# Patient Record
Sex: Female | Born: 1980 | Race: White | Hispanic: Yes | Marital: Married | Smoking: Never smoker
Health system: Southern US, Community
[De-identification: ages and names within clinical notes are randomized; demographics above are authoritative.]

## PROBLEM LIST (undated history)

## (undated) ENCOUNTER — Inpatient Hospital Stay (HOSPITAL_COMMUNITY): Payer: Self-pay

## (undated) DIAGNOSIS — L509 Urticaria, unspecified: Secondary | ICD-10-CM

## (undated) DIAGNOSIS — K589 Irritable bowel syndrome without diarrhea: Secondary | ICD-10-CM

## (undated) DIAGNOSIS — N946 Dysmenorrhea, unspecified: Secondary | ICD-10-CM

## (undated) DIAGNOSIS — L309 Dermatitis, unspecified: Secondary | ICD-10-CM

## (undated) DIAGNOSIS — IMO0002 Reserved for concepts with insufficient information to code with codable children: Secondary | ICD-10-CM

## (undated) DIAGNOSIS — F32A Depression, unspecified: Secondary | ICD-10-CM

## (undated) DIAGNOSIS — K219 Gastro-esophageal reflux disease without esophagitis: Secondary | ICD-10-CM

## (undated) DIAGNOSIS — R51 Headache: Secondary | ICD-10-CM

## (undated) DIAGNOSIS — R87619 Unspecified abnormal cytological findings in specimens from cervix uteri: Secondary | ICD-10-CM

## (undated) DIAGNOSIS — Z889 Allergy status to unspecified drugs, medicaments and biological substances status: Secondary | ICD-10-CM

## (undated) DIAGNOSIS — Z8619 Personal history of other infectious and parasitic diseases: Secondary | ICD-10-CM

## (undated) DIAGNOSIS — G43909 Migraine, unspecified, not intractable, without status migrainosus: Secondary | ICD-10-CM

## (undated) DIAGNOSIS — F419 Anxiety disorder, unspecified: Secondary | ICD-10-CM

## (undated) HISTORY — DX: Dysmenorrhea, unspecified: N94.6

## (undated) HISTORY — DX: Reserved for concepts with insufficient information to code with codable children: IMO0002

## (undated) HISTORY — DX: Dermatitis, unspecified: L30.9

## (undated) HISTORY — DX: Personal history of other infectious and parasitic diseases: Z86.19

## (undated) HISTORY — DX: Gastro-esophageal reflux disease without esophagitis: K21.9

## (undated) HISTORY — DX: Depression, unspecified: F32.A

## (undated) HISTORY — DX: Anxiety disorder, unspecified: F41.9

## (undated) HISTORY — DX: Migraine, unspecified, not intractable, without status migrainosus: G43.909

## (undated) HISTORY — DX: Urticaria, unspecified: L50.9

## (undated) HISTORY — DX: Allergy status to unspecified drugs, medicaments and biological substances: Z88.9

## (undated) HISTORY — DX: Unspecified abnormal cytological findings in specimens from cervix uteri: R87.619

## (undated) HISTORY — DX: Irritable bowel syndrome, unspecified: K58.9

## (undated) HISTORY — DX: Headache: R51

---

## 2011-05-23 HISTORY — PX: ESOPHAGOGASTRODUODENOSCOPY: SHX1529

## 2012-11-12 ENCOUNTER — Encounter: Payer: Self-pay | Admitting: Obstetrics & Gynecology

## 2013-03-24 ENCOUNTER — Other Ambulatory Visit (INDEPENDENT_AMBULATORY_CARE_PROVIDER_SITE_OTHER): Payer: Medicaid Other | Admitting: General Practice

## 2013-03-24 DIAGNOSIS — Z3201 Encounter for pregnancy test, result positive: Secondary | ICD-10-CM

## 2013-04-08 ENCOUNTER — Encounter: Payer: Self-pay | Admitting: Obstetrics and Gynecology

## 2013-04-08 ENCOUNTER — Encounter: Payer: Self-pay | Admitting: Obstetrics & Gynecology

## 2013-04-08 ENCOUNTER — Ambulatory Visit (INDEPENDENT_AMBULATORY_CARE_PROVIDER_SITE_OTHER): Payer: Medicaid Other | Admitting: Obstetrics and Gynecology

## 2013-04-08 VITALS — BP 119/78 | Ht 61.0 in | Wt 144.8 lb

## 2013-04-08 DIAGNOSIS — Z3401 Encounter for supervision of normal first pregnancy, first trimester: Secondary | ICD-10-CM

## 2013-04-08 DIAGNOSIS — O9989 Other specified diseases and conditions complicating pregnancy, childbirth and the puerperium: Secondary | ICD-10-CM

## 2013-04-08 DIAGNOSIS — Z3400 Encounter for supervision of normal first pregnancy, unspecified trimester: Secondary | ICD-10-CM | POA: Insufficient documentation

## 2013-04-08 DIAGNOSIS — O26899 Other specified pregnancy related conditions, unspecified trimester: Secondary | ICD-10-CM | POA: Insufficient documentation

## 2013-04-08 DIAGNOSIS — O26891 Other specified pregnancy related conditions, first trimester: Secondary | ICD-10-CM

## 2013-04-08 DIAGNOSIS — R519 Headache, unspecified: Secondary | ICD-10-CM

## 2013-04-08 DIAGNOSIS — K589 Irritable bowel syndrome without diarrhea: Secondary | ICD-10-CM

## 2013-04-08 LAB — POCT URINALYSIS DIP (DEVICE)
Glucose, UA: NEGATIVE mg/dL
Hgb urine dipstick: NEGATIVE
Nitrite: NEGATIVE
Protein, ur: NEGATIVE mg/dL
Urobilinogen, UA: 0.2 mg/dL (ref 0.0–1.0)
pH: 6 (ref 5.0–8.0)

## 2013-04-08 NOTE — Progress Notes (Signed)
Pulse: 65 Discussed weight gain goal of 15-25 lb. Having trouble with her IBS, bloating. Has some cramping in her uterus. Getting better but has been like this for a few weeks.  Last pap August 2013, normal.

## 2013-04-08 NOTE — Patient Instructions (Signed)
Pregnancy - First Trimester During sexual intercourse, millions of sperm go into the vagina. Only 1 sperm will penetrate and fertilize the female egg while it is in the Fallopian tube. One week later, the fertilized egg implants into the wall of the uterus. An embryo begins to develop into a baby. At 6 to 8 weeks, the eyes and face are formed and the heartbeat can be seen on ultrasound. At the end of 12 weeks (first trimester), all the baby's organs are formed. Now that you are pregnant, you will want to do everything you can to have a healthy baby. Two of the most important things are to get good prenatal care and follow your caregiver's instructions. Prenatal care is all the medical care you receive before the baby's birth. It is given to prevent, find, and treat problems during the pregnancy and childbirth. PRENATAL EXAMS  During prenatal visits, your weight, blood pressure, and urine are checked. This is done to make sure you are healthy and progressing normally during the pregnancy.  A pregnant woman should gain 25 to 35 pounds during the pregnancy. However, if you are overweight or underweight, your caregiver will advise you regarding your weight.  Your caregiver will ask and answer questions for you.  Blood work, cervical cultures, other necessary tests, and a Pap test are done during your prenatal exams. These tests are done to check on your health and the probable health of your baby. Tests are strongly recommended and done for HIV with your permission. This is the virus that causes AIDS. These tests are done because medicines can be given to help prevent your baby from being born with this infection should you have been infected without knowing it. Blood work is also used to find out your blood type, previous infections, and follow your blood levels (hemoglobin).  Low hemoglobin (anemia) is common during pregnancy. Iron and vitamins are given to help prevent this. Later in the pregnancy,  blood tests for diabetes will be done along with any other tests if any problems develop.  You may need other tests to make sure you and the baby are doing well. CHANGES DURING THE FIRST TRIMESTER  Your body goes through many changes during pregnancy. They vary from person to person. Talk to your caregiver about changes you notice and are concerned about. Changes can include:  Your menstrual period stops.  The egg and sperm carry the genes that determine what you look like. Genes from you and your partner are forming a baby. The female genes determine whether the baby is a boy or a girl.  Your body increases in girth and you may feel bloated.  Feeling sick to your stomach (nauseous) and throwing up (vomiting). If the vomiting is uncontrollable, call your caregiver.  Your breasts will begin to enlarge and become tender.  Your nipples may stick out more and become darker.  The need to urinate more. Painful urination may mean you have a bladder infection.  Tiring easily.  Loss of appetite.  Cravings for certain kinds of food.  At first, you may gain or lose a couple of pounds.  You may have changes in your emotions from day to day (excited to be pregnant or concerned something may go wrong with the pregnancy and baby).  You may have more vivid and strange dreams. HOME CARE INSTRUCTIONS   It is very important to avoid all smoking, alcohol and non-prescribed drugs during your pregnancy. These affect the formation and growth of the baby.   Avoid chemicals while pregnant to ensure the delivery of a healthy infant.  Start your prenatal visits by the 12th week of pregnancy. They are usually scheduled monthly at first, then more often in the last 2 months before delivery. Keep your caregiver's appointments. Follow your caregiver's instructions regarding medicine use, blood and lab tests, exercise, and diet.  During pregnancy, you are providing food for you and your baby. Eat regular,  well-balanced meals. Choose foods such as meat, fish, milk and other low fat dairy products, vegetables, fruits, and whole-grain breads and cereals. Your caregiver will tell you of the ideal weight gain.  You can help morning sickness by keeping soda crackers at the bedside. Eat a couple before arising in the morning. You may want to use the crackers without salt on them.  Eating 4 to 5 small meals rather than 3 large meals a day also may help the nausea and vomiting.  Drinking liquids between meals instead of during meals also seems to help nausea and vomiting.  A physical sexual relationship may be continued throughout pregnancy if there are no other problems. Problems may be early (premature) leaking of amniotic fluid from the membranes, vaginal bleeding, or belly (abdominal) pain.  Exercise regularly if there are no restrictions. Check with your caregiver or physical therapist if you are unsure of the safety of some of your exercises. Greater weight gain will occur in the last 2 trimesters of pregnancy. Exercising will help:  Control your weight.  Keep you in shape.  Prepare you for labor and delivery.  Help you lose your pregnancy weight after you deliver your baby.  Wear a good support or jogging bra for breast tenderness during pregnancy. This may help if worn during sleep too.  Ask when prenatal classes are available. Begin classes when they are offered.  Do not use hot tubs, steam rooms, or saunas.  Wear your seat belt when driving. This protects you and your baby if you are in an accident.  Avoid raw meat, uncooked cheese, cat litter boxes, and soil used by cats throughout the pregnancy. These carry germs that can cause birth defects in the baby.  The first trimester is a good time to visit your dentist for your dental health. Getting your teeth cleaned is okay. Use a softer toothbrush and brush gently during pregnancy.  Ask for help if you have financial, counseling, or  nutritional needs during pregnancy. Your caregiver will be able to offer counseling for these needs as well as refer you for other special needs.  Do not take any medicines or herbs unless told by your caregiver.  Inform your caregiver if there is any mental or physical domestic violence.  Make a list of emergency phone numbers of family, friends, hospital, and police and fire departments.  Write down your questions. Take them to your prenatal visit.  Do not douche.  Do not cross your legs.  If you have to stand for long periods of time, rotate you feet or take small steps in a circle.  You may have more vaginal secretions that may require a sanitary pad. Do not use tampons or scented sanitary pads. MEDICINES AND DRUG USE IN PREGNANCY  Take prenatal vitamins as directed. The vitamin should contain 1 milligram of folic acid. Keep all vitamins out of reach of children. Only a couple vitamins or tablets containing iron may be fatal to a baby or young child when ingested.  Avoid use of all medicines, including herbs, over-the-counter medicines, not   prescribed or suggested by your caregiver. Only take over-the-counter or prescription medicines for pain, discomfort, or fever as directed by your caregiver. Do not use aspirin, ibuprofen, or naproxen unless directed by your caregiver.  Let your caregiver also know about herbs you may be using.  Alcohol is related to a number of birth defects. This includes fetal alcohol syndrome. All alcohol, in any form, should be avoided completely. Smoking will cause low birth rate and premature babies.  Street or illegal drugs are very harmful to the baby. They are absolutely forbidden. A baby born to an addicted mother will be addicted at birth. The baby will go through the same withdrawal an adult does.  Let your caregiver know about any medicines that you have to take and for what reason you take them. SEEK MEDICAL CARE IF:  You have any concerns or  worries during your pregnancy. It is better to call with your questions if you feel they cannot wait, rather than worry about them. SEEK IMMEDIATE MEDICAL CARE IF:   An unexplained oral temperature above 102 F (38.9 C) develops, or as your caregiver suggests.  You have leaking of fluid from the vagina (birth canal). If leaking membranes are suspected, take your temperature and inform your caregiver of this when you call.  There is vaginal spotting or bleeding. Notify your caregiver of the amount and how many pads are used.  You develop a bad smelling vaginal discharge with a change in the color.  You continue to feel sick to your stomach (nauseated) and have no relief from remedies suggested. You vomit blood or coffee ground-like materials.  You lose more than 2 pounds of weight in 1 week.  You gain more than 2 pounds of weight in 1 week and you notice swelling of your face, hands, feet, or legs.  You gain 5 pounds or more in 1 week (even if you do not have swelling of your hands, face, legs, or feet).  You get exposed to German measles and have never had them.  You are exposed to fifth disease or chickenpox.  You develop belly (abdominal) pain. Round ligament discomfort is a common non-cancerous (benign) cause of abdominal pain in pregnancy. Your caregiver still must evaluate this.  You develop headache, fever, diarrhea, pain with urination, or shortness of breath.  You fall or are in a car accident or have any kind of trauma.  There is mental or physical violence in your home. Document Released: 09/03/2001 Document Revised: 06/03/2012 Document Reviewed: 03/07/2009 [employer] Patient Information 2014 [employer].  Contraception Choices Contraception (birth control) is the use of any methods or devices to prevent pregnancy. Below are some methods to help avoid pregnancy. HORMONAL METHODS   Contraceptive implant. This is a thin, plastic tube containing progesterone hormone. It  does not contain estrogen hormone. Your caregiver inserts the tube in the inner part of the upper arm. The tube can remain in place for up to 3 years. After 3 years, the implant must be removed. The implant prevents the ovaries from releasing an egg (ovulation), thickens the cervical mucus which prevents sperm from entering the uterus, and thins the lining of the inside of the uterus.  Progesterone-only injections. These injections are given every 3 months by your caregiver to prevent pregnancy. This synthetic progesterone hormone stops the ovaries from releasing eggs. It also thickens cervical mucus and changes the uterine lining. This makes it harder for sperm to survive in the uterus.  Birth control pills. These pills   contain estrogen and progesterone hormone. They work by stopping the egg from forming in the ovary (ovulation). Birth control pills are prescribed by a caregiver.Birth control pills can also be used to treat heavy periods.  Minipill. This type of birth control pill contains only the progesterone hormone. They are taken every day of each month and must be prescribed by your caregiver.  Birth control patch. The patch contains hormones similar to those in birth control pills. It must be changed once a week and is prescribed by a caregiver.  Vaginal ring. The ring contains hormones similar to those in birth control pills. It is left in the vagina for 3 weeks, removed for 1 week, and then a new one is put back in place. The patient must be comfortable inserting and removing the ring from the vagina.A caregiver's prescription is necessary.  Emergency contraception. Emergency contraceptives prevent pregnancy after unprotected sexual intercourse. This pill can be taken right after sex or up to 5 days after unprotected sex. It is most effective the sooner you take the pills after having sexual intercourse. Emergency contraceptive pills are available without a prescription. Check with your  pharmacist. Do not use emergency contraception as your only form of birth control. BARRIER METHODS   Female condom. This is a thin sheath (latex or rubber) that is worn over the penis during sexual intercourse. It can be used with spermicide to increase effectiveness.  Female condom. This is a soft, loose-fitting sheath that is put into the vagina before sexual intercourse.  Diaphragm. This is a soft, latex, dome-shaped barrier that must be fitted by a caregiver. It is inserted into the vagina, along with a spermicidal jelly. It is inserted before intercourse. The diaphragm should be left in the vagina for 6 to 8 hours after intercourse.  Cervical cap. This is a round, soft, latex or plastic cup that fits over the cervix and must be fitted by a caregiver. The cap can be left in place for up to 48 hours after intercourse.  Sponge. This is a soft, circular piece of polyurethane foam. The sponge has spermicide in it. It is inserted into the vagina after wetting it and before sexual intercourse.  Spermicides. These are chemicals that kill or block sperm from entering the cervix and uterus. They come in the form of creams, jellies, suppositories, foam, or tablets. They do not require a prescription. They are inserted into the vagina with an applicator before having sexual intercourse. The process must be repeated every time you have sexual intercourse. INTRAUTERINE CONTRACEPTION  Intrauterine device (IUD). This is a T-shaped device that is put in a woman's uterus during a menstrual period to prevent pregnancy. There are 2 types:  Copper IUD. This type of IUD is wrapped in copper wire and is placed inside the uterus. Copper makes the uterus and fallopian tubes produce a fluid that kills sperm. It can stay in place for 10 years.  Hormone IUD. This type of IUD contains the hormone progestin (synthetic progesterone). The hormone thickens the cervical mucus and prevents sperm from entering the uterus, and it  also thins the uterine lining to prevent implantation of a fertilized egg. The hormone can weaken or kill the sperm that get into the uterus. It can stay in place for 5 years. PERMANENT METHODS OF CONTRACEPTION  Female tubal ligation. This is when the woman's fallopian tubes are surgically sealed, tied, or blocked to prevent the egg from traveling to the uterus.  Female sterilization. This is when   the female has the tubes that carry sperm tied off (vasectomy).This blocks sperm from entering the vagina during sexual intercourse. After the procedure, the man can still ejaculate fluid (semen). NATURAL PLANNING METHODS  Natural family planning. This is not having sexual intercourse or using a barrier method (condom, diaphragm, cervical cap) on days the woman could become pregnant.  Calendar method. This is keeping track of the length of each menstrual cycle and identifying when you are fertile.  Ovulation method. This is avoiding sexual intercourse during ovulation.  Symptothermal method. This is avoiding sexual intercourse during ovulation, using a thermometer and ovulation symptoms.  Post-ovulation method. This is timing sexual intercourse after you have ovulated. Regardless of which type or method of contraception you choose, it is important that you use condoms to protect against the transmission of sexually transmitted diseases (STDs). Talk with your caregiver about which form of contraception is most appropriate for you. Document Released: 09/09/2005 Document Revised: 12/02/2011 Document Reviewed: 01/16/2011 [employer] Patient Information 2014 [employer].  Breastfeeding A change in hormones during your pregnancy causes growth of your breast tissue and an increase in number and size of milk ducts. The hormone prolactin allows proteins, sugars, and fats from your blood supply to make breast milk in your milk-producing glands. The hormone progesterone prevents breast milk from being released  before the birth of your baby. After the birth of your baby, your progesterone level decreases allowing breast milk to be released. Thoughts of your baby, as well as his or her sucking or crying, can stimulate the release of milk from the milk-producing glands. Deciding to breastfeed (nurse) is one of the best choices you can make for you and your baby. The information that follows gives a brief review of the benefits, as well as other important skills to know about breastfeeding. BENEFITS OF BREASTFEEDING For your baby  The first milk (colostrum) helps your baby's digestive system function better.   There are antibodies in your milk that help your baby fight off infections.   Your baby has a lower incidence of asthma, allergies, and sudden infant death syndrome (SIDS).   The nutrients in breast milk are better for your baby than infant formulas.  Breast milk improves your baby's brain development.   Your baby will have less gas, colic, and constipation.  Your baby is less likely to develop other conditions, such as childhood obesity, asthma, or diabetes mellitus. For you  Breastfeeding helps develop a very special bond between you and your baby.   Breastfeeding is convenient, always available at the correct temperature, and costs nothing.   Breastfeeding helps to burn calories and helps you lose the weight gained during pregnancy.   Breastfeeding makes your uterus contract back down to normal size faster and slows bleeding following delivery.   Breastfeeding mothers have a lower risk of developing osteoporosis or breast or ovarian cancer later in life.  BREASTFEEDING FREQUENCY  A healthy, full-term baby may breastfeed as often as every hour or space his or her feedings to every 3 hours. Breastfeeding frequency will vary from baby to baby.   Newborns should be fed no less than every 2 3 hours during the day and every 4 5 hours during the night. You should breastfeed a  minimum of 8 feedings in a 24 hour period.  Awaken your baby to breastfeed if it has been 3 4 hours since the last feeding.  Breastfeed when you feel the need to reduce the fullness of your breasts or when   your newborn shows signs of hunger. Signs that your baby may be hungry include:  Increased alertness or activity.  Stretching.  Movement of the head from side to side.  Movement of the head and opening of the mouth when the corner of the mouth or cheek is stroked (rooting).  Increased sucking sounds, smacking lips, cooing, sighing, or squeaking.  Hand-to-mouth movements.  Increased sucking of fingers or hands.  Fussing.  Intermittent crying.  Signs of extreme hunger will require calming and consoling before you try to feed your baby. Signs of extreme hunger may include:  Restlessness.  A loud, strong cry.  Screaming.  Frequent feeding will help you make more milk and will help prevent problems, such as sore nipples and engorgement of the breasts.  BREASTFEEDING   Whether lying down or sitting, be sure that the baby's abdomen is facing your abdomen.   Support your breast with 4 fingers under your breast and your thumb above your nipple. Make sure your fingers are well away from your nipple and your baby's mouth.   Stroke your baby's lips gently with your finger or nipple.   When your baby's mouth is open wide enough, place all of your nipple and as much of the colored area around your nipple (areola) as possible into your baby's mouth.  More areola should be visible above his or her upper lip than below his or her lower lip.  Your baby's tongue should be between his or her lower gum and your breast.  Ensure that your baby's mouth is correctly positioned around the nipple (latched). Your baby's lips should create a seal on your breast.  Signs that your baby has effectively latched onto your nipple include:  Tugging or sucking without pain.  Swallowing heard  between sucks.  Absent click or smacking sound.  Muscle movement above and in front of his or her ears with sucking.  Your baby must suck about 2 3 minutes in order to get your milk. Allow your baby to feed on each breast as long as he or she wants. Nurse your baby until he or she unlatches or falls asleep at the first breast, then offer the second breast.  Signs that your baby is full and satisfied include:  A gradual decrease in the number of sucks or complete cessation of sucking.  Falling asleep.  Extension or relaxation of his or her body.  Retention of a small amount of milk in his or her mouth.  Letting go of your breast by himself or herself.  Signs of effective breastfeeding in you include:  Breasts that have increased firmness, weight, and size prior to feeding.  Breasts that are softer after nursing.  Increased milk volume, as well as a change in milk consistency and color by the 5th day of breastfeeding.  Breast fullness relieved by breastfeeding.  Nipples are not sore, cracked, or bleeding.  If needed, break the suction by putting your finger into the corner of your baby's mouth and sliding your finger between his or her gums. Then, remove your breast from his or her mouth.  It is common for babies to spit up a small amount after a feeding.  Babies often swallow air during feeding. This can make babies fussy. Burping your baby between breasts can help with this.  Vitamin D supplements are recommended for babies who get only breast milk.  Avoid using a pacifier during your baby's first 4 6 weeks.  Avoid supplemental feedings of water, formula, or   juice in place of breastfeeding. Breast milk is all the food your baby needs. It is not necessary for your baby to have water or formula. Your breasts will make more milk if supplemental feedings are avoided during the early weeks. HOW TO TELL WHETHER YOUR BABY IS GETTING ENOUGH BREAST MILK Wondering whether or not  your baby is getting enough milk is a common concern among mothers. You can be assured that your baby is getting enough milk if:   Your baby is actively sucking and you hear swallowing.   Your baby seems relaxed and satisfied after a feeding.   Your baby nurses at least 8 12 times in a 24 hour time period.  During the first 3 5 days of age:  Your baby is wetting at least 3 5 diapers in a 24 hour period. The urine should be clear and pale yellow.  Your baby is having at least 3 4 stools in a 24 hour period. The stool should be soft and yellow.  At 5 7 days of age, your baby is having at least 3 6 stools in a 24 hour period. The stool should be seedy and yellow by 5 days of age.  Your baby has a weight loss less than 7 10% during the first 3 days of age.  Your baby does not lose weight after 3 7 days of age.  Your baby gains 4 7 ounces each week after he or she is 4 days of age.  Your baby gains weight by 5 days of age and is back to birth weight within 2 weeks. ENGORGEMENT In the first week after your baby is born, you may experience extremely full breasts (engorgement). When engorged, your breasts may feel heavy, warm, or tender to the touch. Engorgement peaks within 24 48 hours after delivery of your baby.  Engorgement may be reduced by:  Continuing to breastfeed.  Increasing the frequency of breastfeeding.  Taking warm showers or applying warm, moist heat to your breasts just before each feeding. This increases circulation and helps the milk flow.   Gently massaging your breast before and during the feedings. With your fingertips, massage from your chest wall towards your nipple in a circular motion.   Ensuring that your baby empties at least one breast at every feeding. It also helps to start the next feeding on the opposite breast.   Expressing breast milk by hand or by using a breast pump to empty the breasts if your baby is sleepy, or not nursing well. You may also  want to express milk if you are returning to work oryou feel you are getting engorged.  Ensuring your baby is latched on and positioned properly while breastfeeding. If you follow these suggestions, your engorgement should improve in 24 48 hours. If you are still experiencing difficulty, call your lactation consultant or caregiver.  CARING FOR YOURSELF Take care of your breasts.  Bathe or shower daily.   Avoid using soap on your nipples.   Wear a supportive bra. Avoid wearing underwire style bras.  Air dry your nipples for a 3 4minutes after each feeding.   Use only cotton bra pads to absorb breast milk leakage. Leaking of breast milk between feedings is normal.   Use only pure lanolin on your nipples after nursing. You do not need to wash it off before feeding your baby again. Another option is to express a few drops of breast milk and gently massage that milk into your nipples.  Continue   breast self-awareness checks. Take care of yourself.  Eat healthy foods. Alternate 3 meals with 3 snacks.  Avoid foods that you notice affect your baby in a bad way.  Drink milk, fruit juice, and water to satisfy your thirst (about 8 glasses a day).   Rest often, relax, and take your prenatal vitamins to prevent fatigue, stress, and anemia.  Avoid chewing and smoking tobacco.  Avoid alcohol and drug use.  Take over-the-counter and prescribed medicine only as directed by your caregiver or pharmacist. You should always check with your caregiver or pharmacist before taking any new medicine, vitamin, or herbal supplement.  Know that pregnancy is possible while breastfeeding. If desired, talk to your caregiver about family planning and safe birth control methods that may be used while breastfeeding. SEEK MEDICAL CARE IF:   You feel like you want to stop breastfeeding or have become frustrated with breastfeeding.  You have painful breasts or nipples.  Your nipples are cracked or  bleeding.  Your breasts are red, tender, or warm.  You have a swollen area on either breast.  You have a fever or chills.  You have nausea or vomiting.  You have drainage from your nipples.  Your breasts do not become full before feedings by the 5th day after delivery.  You feel sad and depressed.  Your baby is too sleepy to eat well.  Your baby is having trouble sleeping.   Your baby is wetting less than 3 diapers in a 24 hour period.  Your baby has less than 3 stools in a 24 hour period.  Your baby's skin or the white part of his or her eyes becomes more yellow.   Your baby is not gaining weight by 5 days of age. MAKE SURE YOU:   Understand these instructions.  Will watch your condition.  Will get help right away if you are not doing well or get worse. Document Released: 09/09/2005 Document Revised: 06/03/2012 Document Reviewed: 04/15/2012 [employer] Patient Information 2014 [employer].  

## 2013-04-08 NOTE — Progress Notes (Signed)
   Subjective:    Rhonda Graves is a G1P0 [redacted]w[redacted]d being seen today for her first obstetrical visit.  Her obstetrical history is significant for migraine headaches and IBS. Patient does intend to breast feed. Pregnancy history fully reviewed.  Patient reports some cramping pain over the past 3 weeks which is improving.  Filed Vitals:   04/08/13 0811 04/08/13 0813  BP: 119/78   Height:  5\' 1"  (1.549 m)  Weight: 144 lb 12.8 oz (65.681 kg)     HISTORY: OB History   Grav Para Term Preterm Abortions TAB SAB Ect Mult Living   1              # Outc Date GA Lbr Len/2nd Wgt Sex Del Anes PTL Lv   1 CUR              Past Medical History  Diagnosis Date  . Headache(784.0)     migraine  . IBS (irritable bowel syndrome)   . Multiple allergies   . GERD (gastroesophageal reflux disease)   . Abnormal Pap smear     had colpo in past, normal pap since then   Past Surgical History  Procedure Laterality Date  . No past surgeries     Family History  Problem Relation Age of Onset  . Arthritis Mother   . Hypothyroidism Mother   . Asthma Brother      Exam    Uterus:     Pelvic Exam:    Perineum: No Hemorrhoids, Normal Perineum   Vulva: normal   Vagina:  normal mucosa, normal discharge   pH:    Cervix: closed, long, posterior   Adnexa: normal adnexa and no mass, fullness, tenderness   Bony Pelvis: android  System: Breast:  normal appearance, no masses or tenderness   Skin: normal coloration and turgor, no rashes    Neurologic: oriented, no focal deficits   Extremities: normal strength, tone, and muscle mass   HEENT extra ocular movement intact   Mouth/Teeth mucous membranes moist, pharynx normal without lesions and dental hygiene good   Neck supple and no masses   Cardiovascular: regular rate and rhythm   Respiratory:  chest clear, no wheezing, crepitations, rhonchi, normal symmetric air entry   Abdomen: soft, non-tender; bowel sounds normal; no masses,  no organomegaly   Urinary:        Assessment:    Pregnancy: G1P0 Patient Active Problem List   Diagnosis Date Noted  . Supervision of normal first pregnancy 04/08/2013  . Headache in pregnancy, antepartum 04/08/2013  . IBS (irritable bowel syndrome) 04/08/2013        Plan:     Initial labs drawn. Prenatal vitamins. Problem list reviewed and updated. Patient with history of abnormal pap smear- pap smear collected today Genetic Screening discussed First Screen and Quad Screen: patient desires to wait for Medicaid to come .  Ultrasound discussed; fetal survey: requested.  Follow up in 4 weeks. 50% of 30 min visit spent on counseling and coordination of care.     Azalia Neuberger 04/08/2013

## 2013-04-09 LAB — HIV ANTIBODY (ROUTINE TESTING W REFLEX): HIV: NONREACTIVE

## 2013-04-09 LAB — OBSTETRIC PANEL
Antibody Screen: NEGATIVE
Basophils Absolute: 0 10*3/uL (ref 0.0–0.1)
Basophils Relative: 0 % (ref 0–1)
Eosinophils Relative: 2 % (ref 0–5)
HCT: 37.3 % (ref 36.0–46.0)
MCHC: 33.5 g/dL (ref 30.0–36.0)
MCV: 89.4 fL (ref 78.0–100.0)
Monocytes Absolute: 0.6 10*3/uL (ref 0.1–1.0)
RDW: 13.4 % (ref 11.5–15.5)

## 2013-04-10 LAB — CULTURE, OB URINE

## 2013-04-12 ENCOUNTER — Encounter: Payer: Self-pay | Admitting: Obstetrics and Gynecology

## 2013-04-12 LAB — HEMOGLOBINOPATHY EVALUATION
Hgb A2 Quant: 3.2 % (ref 2.2–3.2)
Hgb F Quant: 0 % (ref 0.0–2.0)

## 2013-05-06 ENCOUNTER — Ambulatory Visit (INDEPENDENT_AMBULATORY_CARE_PROVIDER_SITE_OTHER): Payer: Medicaid Other | Admitting: Obstetrics and Gynecology

## 2013-05-06 VITALS — BP 127/76 | Temp 98.0°F | Wt 145.2 lb

## 2013-05-06 DIAGNOSIS — Z3400 Encounter for supervision of normal first pregnancy, unspecified trimester: Secondary | ICD-10-CM

## 2013-05-06 LAB — POCT URINALYSIS DIP (DEVICE)
Bilirubin Urine: NEGATIVE
Nitrite: NEGATIVE
Protein, ur: NEGATIVE mg/dL
Urobilinogen, UA: 0.2 mg/dL (ref 0.0–1.0)
pH: 6.5 (ref 5.0–8.0)

## 2013-05-06 NOTE — Progress Notes (Signed)
Patient doing well. Lab results reviewed with patient. Patient complaining of tender varicose veins in right lower extremity. 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 On exam, both extremities are equal in size, no palpable cord. Visible and palpable varicose vein in right lower extremity not tender.  Advised patient to elevate legs as often as possible, use compression stocking to minimize progression of varicose veins during pregnancy. If pain returns or becomes persistent may need to be seen by vascular surgery. Patient still awaiting medicaid and desires to have genetic screening done pending medicaid

## 2013-05-06 NOTE — Progress Notes (Signed)
Pulse- 58  Pt reports pain in right calf with varicose veins

## 2013-05-27 ENCOUNTER — Encounter: Payer: Self-pay | Admitting: Obstetrics & Gynecology

## 2013-06-03 ENCOUNTER — Encounter: Payer: Self-pay | Admitting: Obstetrics & Gynecology

## 2013-06-08 ENCOUNTER — Ambulatory Visit (INDEPENDENT_AMBULATORY_CARE_PROVIDER_SITE_OTHER): Payer: Medicaid Other | Admitting: Obstetrics and Gynecology

## 2013-06-08 VITALS — BP 111/76 | Temp 98.9°F | Wt 150.5 lb

## 2013-06-08 DIAGNOSIS — O9989 Other specified diseases and conditions complicating pregnancy, childbirth and the puerperium: Secondary | ICD-10-CM

## 2013-06-08 DIAGNOSIS — K589 Irritable bowel syndrome without diarrhea: Secondary | ICD-10-CM

## 2013-06-08 LAB — POCT URINALYSIS DIP (DEVICE)
Nitrite: NEGATIVE
Protein, ur: NEGATIVE mg/dL
Urobilinogen, UA: 0.2 mg/dL (ref 0.0–1.0)

## 2013-06-08 NOTE — Progress Notes (Signed)
P = 69   Pt reports increased pelvic pressure, back pain and abdominal cramping   She also has pain of lower Rt leg due to varicosities- has been wearing a support stocking.

## 2013-06-08 NOTE — Progress Notes (Signed)
Feels 1 or 2 fleeting abdominal cramps/day and has LBP often. Abd bloating she attributes to IBS. Denies UTI sx. Discussed abdominal tightening exercises. Anatomy US scheduled. Declines quad screen.

## 2013-06-08 NOTE — Patient Instructions (Signed)
Pregnancy - Second Trimester The second trimester of pregnancy (3 to 6 months) is a period of rapid growth for you and your baby. At the end of the sixth month, your baby is about 9 inches long and weighs 1 1/2 pounds. You will begin to feel the baby move between 18 and 20 weeks of the pregnancy. This is called quickening. Weight gain is faster. A clear fluid (colostrum) may leak out of your breasts. You may feel small contractions of the womb (uterus). This is known as false labor or Braxton-Hicks contractions. This is like a practice for labor when the baby is ready to be born. Usually, the problems with morning sickness have usually passed by the end of your first trimester. Some women develop small dark blotches (called cholasma, mask of pregnancy) on their face that usually goes away after the baby is born. Exposure to the sun makes the blotches worse. Acne may also develop in some pregnant women and pregnant women who have acne, may find that it goes away. PRENATAL EXAMS  Blood work may continue to be done during prenatal exams. These tests are done to check on your health and the probable health of your baby. Blood work is used to follow your blood levels (hemoglobin). Anemia (low hemoglobin) is common during pregnancy. Iron and vitamins are given to help prevent this. You will also be checked for diabetes between 24 and 28 weeks of the pregnancy. Some of the previous blood tests may be repeated.  The size of the uterus is measured during each visit. This is to make sure that the baby is continuing to grow properly according to the dates of the pregnancy.  Your blood pressure is checked every prenatal visit. This is to make sure you are not getting toxemia.  Your urine is checked to make sure you do not have an infection, diabetes or protein in the urine.  Your weight is checked often to make sure gains are happening at the suggested rate. This is to ensure that both you and your baby are  growing normally.  Sometimes, an ultrasound is performed to confirm the proper growth and development of the baby. This is a test which bounces harmless sound waves off the baby so your caregiver can more accurately determine due dates. Sometimes, a test is done on the amniotic fluid surrounding the baby. This test is called an amniocentesis. The amniotic fluid is obtained by sticking a needle into the belly (abdomen). This is done to check the chromosomes in instances where there is a concern about possible genetic problems with the baby. It is also sometimes done near the end of pregnancy if an early delivery is required. In this case, it is done to help make sure the baby's lungs are mature enough for the baby to live outside of the womb. CHANGES OCCURING IN THE SECOND TRIMESTER OF PREGNANCY Your body goes through many changes during pregnancy. They vary from person to person. Talk to your caregiver about changes you notice that you are concerned about.  During the second trimester, you will likely have an increase in your appetite. It is normal to have cravings for certain foods. This varies from person to person and pregnancy to pregnancy.  Your lower abdomen will begin to bulge.  You may have to urinate more often because the uterus and baby are pressing on your bladder. It is also common to get more bladder infections during pregnancy. You can help this by drinking lots of fluids   and emptying your bladder before and after intercourse.  You may begin to get stretch marks on your hips, abdomen, and breasts. These are normal changes in the body during pregnancy. There are no exercises or medicines to take that prevent this change.  You may begin to develop swollen and bulging veins (varicose veins) in your legs. Wearing support hose, elevating your feet for 15 minutes, 3 to 4 times a day and limiting salt in your diet helps lessen the problem.  Heartburn may develop as the uterus grows and  pushes up against the stomach. Antacids recommended by your caregiver helps with this problem. Also, eating smaller meals 4 to 5 times a day helps.  Constipation can be treated with a stool softener or adding bulk to your diet. Drinking lots of fluids, and eating vegetables, fruits, and whole grains are helpful.  Exercising is also helpful. If you have been very active up until your pregnancy, most of these activities can be continued during your pregnancy. If you have been less active, it is helpful to start an exercise program such as walking.  Hemorrhoids may develop at the end of the second trimester. Warm sitz baths and hemorrhoid cream recommended by your caregiver helps hemorrhoid problems.  Backaches may develop during this time of your pregnancy. Avoid heavy lifting, wear low heal shoes, and practice good posture to help with backache problems.  Some pregnant women develop tingling and numbness of their hand and fingers because of swelling and tightening of ligaments in the wrist (carpel tunnel syndrome). This goes away after the baby is born.  As your breasts enlarge, you may have to get a bigger bra. Get a comfortable, cotton, support bra. Do not get a nursing bra until the last month of the pregnancy if you will be nursing the baby.  You may get a dark line from your belly button to the pubic area called the linea nigra.  You may develop rosy cheeks because of increase blood flow to the face.  You may develop spider looking lines of the face, neck, arms, and chest. These go away after the baby is born. HOME CARE INSTRUCTIONS   It is extremely important to avoid all smoking, herbs, alcohol, and unprescribed drugs during your pregnancy. These chemicals affect the formation and growth of the baby. Avoid these chemicals throughout the pregnancy to ensure the delivery of a healthy infant.  Most of your home care instructions are the same as suggested for the first trimester of your  pregnancy. Keep your caregiver's appointments. Follow your caregiver's instructions regarding medicine use, exercise, and diet.  During pregnancy, you are providing food for you and your baby. Continue to eat regular, well-balanced meals. Choose foods such as meat, fish, milk and other low fat dairy products, vegetables, fruits, and whole-grain breads and cereals. Your caregiver will tell you of the ideal weight gain.  A physical sexual relationship may be continued up until near the end of pregnancy if there are no other problems. Problems could include early (premature) leaking of amniotic fluid from the membranes, vaginal bleeding, abdominal pain, or other medical or pregnancy problems.  Exercise regularly if there are no restrictions. Check with your caregiver if you are unsure of the safety of some of your exercises. The greatest weight gain will occur in the last 2 trimesters of pregnancy. Exercise will help you:  Control your weight.  Get you in shape for labor and delivery.  Lose weight after you have the baby.  Wear   a good support or jogging bra for breast tenderness during pregnancy. This may help if worn during sleep. Pads or tissues may be used in the bra if you are leaking colostrum.  Do not use hot tubs, steam rooms or saunas throughout the pregnancy.  Wear your seat belt at all times when driving. This protects you and your baby if you are in an accident.  Avoid raw meat, uncooked cheese, cat litter boxes, and soil used by cats. These carry germs that can cause birth defects in the baby.  The second trimester is also a good time to visit your dentist for your dental health if this has not been done yet. Getting your teeth cleaned is okay. Use a soft toothbrush. Brush gently during pregnancy.  It is easier to leak urine during pregnancy. Tightening up and strengthening the pelvic muscles will help with this problem. Practice stopping your urination while you are going to the  bathroom. These are the same muscles you need to strengthen. It is also the muscles you would use as if you were trying to stop from passing gas. You can practice tightening these muscles up 10 times a set and repeating this about 3 times per day. Once you know what muscles to tighten up, do not perform these exercises during urination. It is more likely to contribute to an infection by backing up the urine.  Ask for help if you have financial, counseling, or nutritional needs during pregnancy. Your caregiver will be able to offer counseling for these needs as well as refer you for other special needs.  Your skin may become oily. If so, wash your face with mild soap, use non-greasy moisturizer and oil or cream based makeup. MEDICINES AND DRUG USE IN PREGNANCY  Take prenatal vitamins as directed. The vitamin should contain 1 milligram of folic acid. Keep all vitamins out of reach of children. Only a couple vitamins or tablets containing iron may be fatal to a baby or young child when ingested.  Avoid use of all medicines, including herbs, over-the-counter medicines, not prescribed or suggested by your caregiver. Only take over-the-counter or prescription medicines for pain, discomfort, or fever as directed by your caregiver. Do not use aspirin.  Let your caregiver also know about herbs you may be using.  Alcohol is related to a number of birth defects. This includes fetal alcohol syndrome. All alcohol, in any form, should be avoided completely. Smoking will cause low birth rate and premature babies.  Street or illegal drugs are very harmful to the baby. They are absolutely forbidden. A baby born to an addicted mother will be addicted at birth. The baby will go through the same withdrawal an adult does. SEEK MEDICAL CARE IF:  You have any concerns or worries during your pregnancy. It is better to call with your questions if you feel they cannot wait, rather than worry about them. SEEK IMMEDIATE  MEDICAL CARE IF:   An unexplained oral temperature above 102 F (38.9 C) develops, or as your caregiver suggests.  You have leaking of fluid from the vagina (birth canal). If leaking membranes are suspected, take your temperature and tell your caregiver of this when you call.  There is vaginal spotting, bleeding, or passing clots. Tell your caregiver of the amount and how many pads are used. Light spotting in pregnancy is common, especially following intercourse.  You develop a bad smelling vaginal discharge with a change in the color from clear to white.  You continue to feel   sick to your stomach (nauseated) and have no relief from remedies suggested. You vomit blood or coffee ground-like materials.  You lose more than 2 pounds of weight or gain more than 2 pounds of weight over 1 week, or as suggested by your caregiver.  You notice swelling of your face, hands, feet, or legs.  You get exposed to German measles and have never had them.  You are exposed to fifth disease or chickenpox.  You develop belly (abdominal) pain. Round ligament discomfort is a common non-cancerous (benign) cause of abdominal pain in pregnancy. Your caregiver still must evaluate you.  You develop a bad headache that does not go away.  You develop fever, diarrhea, pain with urination, or shortness of breath.  You develop visual problems, blurry, or double vision.  You fall or are in a car accident or any kind of trauma.  There is mental or physical violence at home. Document Released: 09/03/2001 Document Revised: 06/03/2012 Document Reviewed: 03/08/2009 [employer] Patient Information 2014 [employer].  

## 2013-06-29 ENCOUNTER — Other Ambulatory Visit: Payer: Self-pay | Admitting: Obstetrics and Gynecology

## 2013-06-29 ENCOUNTER — Ambulatory Visit (HOSPITAL_COMMUNITY)
Admission: RE | Admit: 2013-06-29 | Discharge: 2013-06-29 | Disposition: A | Discharge status: Home / Self Care | Payer: Medicaid Other | Source: Ambulatory Visit | Attending: Obstetrics and Gynecology | Admitting: Obstetrics and Gynecology

## 2013-06-29 DIAGNOSIS — Z3689 Encounter for other specified antenatal screening: Secondary | ICD-10-CM | POA: Insufficient documentation

## 2013-06-29 DIAGNOSIS — K589 Irritable bowel syndrome without diarrhea: Secondary | ICD-10-CM

## 2013-06-29 DIAGNOSIS — Z3402 Encounter for supervision of normal first pregnancy, second trimester: Secondary | ICD-10-CM

## 2013-06-29 DIAGNOSIS — O358XX1 Maternal care for other (suspected) fetal abnormality and damage, fetus 1: Secondary | ICD-10-CM

## 2013-06-30 DIAGNOSIS — O358XX0 Maternal care for other (suspected) fetal abnormality and damage, not applicable or unspecified: Secondary | ICD-10-CM | POA: Insufficient documentation

## 2013-07-06 ENCOUNTER — Ambulatory Visit (INDEPENDENT_AMBULATORY_CARE_PROVIDER_SITE_OTHER): Payer: Medicaid Other | Admitting: Family Medicine

## 2013-07-06 ENCOUNTER — Encounter: Payer: Self-pay | Admitting: Obstetrics & Gynecology

## 2013-07-06 ENCOUNTER — Encounter: Payer: Self-pay | Admitting: Family Medicine

## 2013-07-06 VITALS — BP 121/72 | Temp 96.3°F | Wt 158.0 lb

## 2013-07-06 DIAGNOSIS — B379 Candidiasis, unspecified: Secondary | ICD-10-CM

## 2013-07-06 DIAGNOSIS — O358XX0 Maternal care for other (suspected) fetal abnormality and damage, not applicable or unspecified: Secondary | ICD-10-CM

## 2013-07-06 DIAGNOSIS — Z3402 Encounter for supervision of normal first pregnancy, second trimester: Secondary | ICD-10-CM

## 2013-07-06 DIAGNOSIS — Z23 Encounter for immunization: Secondary | ICD-10-CM

## 2013-07-06 LAB — POCT URINALYSIS DIP (DEVICE)
Bilirubin Urine: NEGATIVE
Glucose, UA: NEGATIVE mg/dL
Ketones, ur: NEGATIVE mg/dL
Specific Gravity, Urine: 1.02 (ref 1.005–1.030)
Urobilinogen, UA: 0.2 mg/dL (ref 0.0–1.0)

## 2013-07-06 MED ORDER — FLUCONAZOLE 150 MG PO TABS: 150.0000 mg | ORAL_TABLET | Freq: Once | ORAL | Status: DC

## 2013-07-06 NOTE — Progress Notes (Signed)
Pt doing well overall. 20w0 today - reviewed [place] - EIF that recommends a 4-6week f/u scan - having a girl - taking ranitidine for heartburn and helps a lot - has a bump on her labia and wants to know what it is  O: see flowsheet.   A/P - went over [place] results. Will plan of f/u US to schedule at next visit (pt requests [facility] as she knows them better- works here as an Equities trader) - bump on labia appears like a folliculitis but overall irritated appearance adn itching. Will treat with diflucan x 1.   - f/u in 4 weeks.

## 2013-07-06 NOTE — Patient Instructions (Signed)
Pregnancy - Second Trimester The second trimester of pregnancy (3 to 6 months) is a period of rapid growth for you and your baby. At the end of the sixth month, your baby is about 9 inches long and weighs 1 1/2 pounds. You will begin to feel the baby move between 18 and 20 weeks of the pregnancy. This is called quickening. Weight gain is faster. A clear fluid (colostrum) may leak out of your breasts. You may feel small contractions of the womb (uterus). This is known as false labor or Braxton-Hicks contractions. This is like a practice for labor when the baby is ready to be born. Usually, the problems with morning sickness have usually passed by the end of your first trimester. Some women develop small dark blotches (called cholasma, mask of pregnancy) on their face that usually goes away after the baby is born. Exposure to the sun makes the blotches worse. Acne may also develop in some pregnant women and pregnant women who have acne, may find that it goes away. PRENATAL EXAMS  Blood work may continue to be done during prenatal exams. These tests are done to check on your health and the probable health of your baby. Blood work is used to follow your blood levels (hemoglobin). Anemia (low hemoglobin) is common during pregnancy. Iron and vitamins are given to help prevent this. You will also be checked for diabetes between 24 and 28 weeks of the pregnancy. Some of the previous blood tests may be repeated.  The size of the uterus is measured during each visit. This is to make sure that the baby is continuing to grow properly according to the dates of the pregnancy.  Your blood pressure is checked every prenatal visit. This is to make sure you are not getting toxemia.  Your urine is checked to make sure you do not have an infection, diabetes or protein in the urine.  Your weight is checked often to make sure gains are happening at the suggested rate. This is to ensure that both you and your baby are  growing normally.  Sometimes, an ultrasound is performed to confirm the proper growth and development of the baby. This is a test which bounces harmless sound waves off the baby so your caregiver can more accurately determine due dates. Sometimes, a test is done on the amniotic fluid surrounding the baby. This test is called an amniocentesis. The amniotic fluid is obtained by sticking a needle into the belly (abdomen). This is done to check the chromosomes in instances where there is a concern about possible genetic problems with the baby. It is also sometimes done near the end of pregnancy if an early delivery is required. In this case, it is done to help make sure the baby's lungs are mature enough for the baby to live outside of the womb. CHANGES OCCURING IN THE SECOND TRIMESTER OF PREGNANCY Your body goes through many changes during pregnancy. They vary from person to person. Talk to your caregiver about changes you notice that you are concerned about.  During the second trimester, you will likely have an increase in your appetite. It is normal to have cravings for certain foods. This varies from person to person and pregnancy to pregnancy.  Your lower abdomen will begin to bulge.  You may have to urinate more often because the uterus and baby are pressing on your bladder. It is also common to get more bladder infections during pregnancy. You can help this by drinking lots of fluids   and emptying your bladder before and after intercourse.  You may begin to get stretch marks on your hips, abdomen, and breasts. These are normal changes in the body during pregnancy. There are no exercises or medicines to take that prevent this change.  You may begin to develop swollen and bulging veins (varicose veins) in your legs. Wearing support hose, elevating your feet for 15 minutes, 3 to 4 times a day and limiting salt in your diet helps lessen the problem.  Heartburn may develop as the uterus grows and  pushes up against the stomach. Antacids recommended by your caregiver helps with this problem. Also, eating smaller meals 4 to 5 times a day helps.  Constipation can be treated with a stool softener or adding bulk to your diet. Drinking lots of fluids, and eating vegetables, fruits, and whole grains are helpful.  Exercising is also helpful. If you have been very active up until your pregnancy, most of these activities can be continued during your pregnancy. If you have been less active, it is helpful to start an exercise program such as walking.  Hemorrhoids may develop at the end of the second trimester. Warm sitz baths and hemorrhoid cream recommended by your caregiver helps hemorrhoid problems.  Backaches may develop during this time of your pregnancy. Avoid heavy lifting, wear low heal shoes, and practice good posture to help with backache problems.  Some pregnant women develop tingling and numbness of their hand and fingers because of swelling and tightening of ligaments in the wrist (carpel tunnel syndrome). This goes away after the baby is born.  As your breasts enlarge, you may have to get a bigger bra. Get a comfortable, cotton, support bra. Do not get a nursing bra until the last month of the pregnancy if you will be nursing the baby.  You may get a dark line from your belly button to the pubic area called the linea nigra.  You may develop rosy cheeks because of increase blood flow to the face.  You may develop spider looking lines of the face, neck, arms, and chest. These go away after the baby is born. HOME CARE INSTRUCTIONS   It is extremely important to avoid all smoking, herbs, alcohol, and unprescribed drugs during your pregnancy. These chemicals affect the formation and growth of the baby. Avoid these chemicals throughout the pregnancy to ensure the delivery of a healthy infant.  Most of your home care instructions are the same as suggested for the first trimester of your  pregnancy. Keep your caregiver's appointments. Follow your caregiver's instructions regarding medicine use, exercise, and diet.  During pregnancy, you are providing food for you and your baby. Continue to eat regular, well-balanced meals. Choose foods such as meat, fish, milk and other low fat dairy products, vegetables, fruits, and whole-grain breads and cereals. Your caregiver will tell you of the ideal weight gain.  A physical sexual relationship may be continued up until near the end of pregnancy if there are no other problems. Problems could include early (premature) leaking of amniotic fluid from the membranes, vaginal bleeding, abdominal pain, or other medical or pregnancy problems.  Exercise regularly if there are no restrictions. Check with your caregiver if you are unsure of the safety of some of your exercises. The greatest weight gain will occur in the last 2 trimesters of pregnancy. Exercise will help you:  Control your weight.  Get you in shape for labor and delivery.  Lose weight after you have the baby.  Wear   a good support or jogging bra for breast tenderness during pregnancy. This may help if worn during sleep. Pads or tissues may be used in the bra if you are leaking colostrum.  Do not use hot tubs, steam rooms or saunas throughout the pregnancy.  Wear your seat belt at all times when driving. This protects you and your baby if you are in an accident.  Avoid raw meat, uncooked cheese, cat litter boxes, and soil used by cats. These carry germs that can cause birth defects in the baby.  The second trimester is also a good time to visit your dentist for your dental health if this has not been done yet. Getting your teeth cleaned is okay. Use a soft toothbrush. Brush gently during pregnancy.  It is easier to leak urine during pregnancy. Tightening up and strengthening the pelvic muscles will help with this problem. Practice stopping your urination while you are going to the  bathroom. These are the same muscles you need to strengthen. It is also the muscles you would use as if you were trying to stop from passing gas. You can practice tightening these muscles up 10 times a set and repeating this about 3 times per day. Once you know what muscles to tighten up, do not perform these exercises during urination. It is more likely to contribute to an infection by backing up the urine.  Ask for help if you have financial, counseling, or nutritional needs during pregnancy. Your caregiver will be able to offer counseling for these needs as well as refer you for other special needs.  Your skin may become oily. If so, wash your face with mild soap, use non-greasy moisturizer and oil or cream based makeup. MEDICINES AND DRUG USE IN PREGNANCY  Take prenatal vitamins as directed. The vitamin should contain 1 milligram of folic acid. Keep all vitamins out of reach of children. Only a couple vitamins or tablets containing iron may be fatal to a baby or young child when ingested.  Avoid use of all medicines, including herbs, over-the-counter medicines, not prescribed or suggested by your caregiver. Only take over-the-counter or prescription medicines for pain, discomfort, or fever as directed by your caregiver. Do not use aspirin.  Let your caregiver also know about herbs you may be using.  Alcohol is related to a number of birth defects. This includes fetal alcohol syndrome. All alcohol, in any form, should be avoided completely. Smoking will cause low birth rate and premature babies.  Street or illegal drugs are very harmful to the baby. They are absolutely forbidden. A baby born to an addicted mother will be addicted at birth. The baby will go through the same withdrawal an adult does. SEEK MEDICAL CARE IF:  You have any concerns or worries during your pregnancy. It is better to call with your questions if you feel they cannot wait, rather than worry about them. SEEK IMMEDIATE  MEDICAL CARE IF:   An unexplained oral temperature above 102 F (38.9 C) develops, or as your caregiver suggests.  You have leaking of fluid from the vagina (birth canal). If leaking membranes are suspected, take your temperature and tell your caregiver of this when you call.  There is vaginal spotting, bleeding, or passing clots. Tell your caregiver of the amount and how many pads are used. Light spotting in pregnancy is common, especially following intercourse.  You develop a bad smelling vaginal discharge with a change in the color from clear to white.  You continue to feel   sick to your stomach (nauseated) and have no relief from remedies suggested. You vomit blood or coffee ground-like materials.  You lose more than 2 pounds of weight or gain more than 2 pounds of weight over 1 week, or as suggested by your caregiver.  You notice swelling of your face, hands, feet, or legs.  You get exposed to German measles and have never had them.  You are exposed to fifth disease or chickenpox.  You develop belly (abdominal) pain. Round ligament discomfort is a common non-cancerous (benign) cause of abdominal pain in pregnancy. Your caregiver still must evaluate you.  You develop a bad headache that does not go away.  You develop fever, diarrhea, pain with urination, or shortness of breath.  You develop visual problems, blurry, or double vision.  You fall or are in a car accident or any kind of trauma.  There is mental or physical violence at home. Document Released: 09/03/2001 Document Revised: 06/03/2012 Document Reviewed: 03/08/2009 [employer] Patient Information 2014 [employer].  

## 2013-07-06 NOTE — Progress Notes (Signed)
Pulse- 75  Edema-feet  Pain-sharp pain "ligament" Pt reports a bump on outside of left labia

## 2013-07-12 ENCOUNTER — Other Ambulatory Visit: Payer: Medicaid Other

## 2013-07-12 DIAGNOSIS — Z1379 Encounter for other screening for genetic and chromosomal anomalies: Secondary | ICD-10-CM

## 2013-07-13 ENCOUNTER — Telehealth: Payer: Self-pay | Admitting: *Deleted

## 2013-07-13 LAB — AFP, QUAD SCREEN
AFP: 54.9 IU/mL
Age Alone: 1:600 {titer}
Down Syndrome Scr Risk Est: 1:7850 {titer}
HCG, Total: 16439 m[IU]/mL
Interpretation-AFP: NEGATIVE
MoM for INH: 0.69
Open Spina bifida: NEGATIVE
Tri 18 Scr Risk Est: NEGATIVE
uE3 Mom: 0.87
uE3 Value: 1.5 ng/mL

## 2013-07-13 NOTE — Telephone Encounter (Signed)
Pt left message requesting to know what OTC meds she can take for a cold.  Returned call to pt and discussed her concern. She has been having nasal congestion, sore throat and a dry cough. I advised her that she may take the following medications: sudafed for nasal congestion, any OTC cough suppressant syrup, any cough lozenges and tylenol for aches and pains. She may also gargle with waqrm salt water several times daily for sore throat.  Pt voiced understanding.

## 2013-07-14 ENCOUNTER — Encounter: Payer: Self-pay | Admitting: Obstetrics and Gynecology

## 2013-08-03 ENCOUNTER — Ambulatory Visit (INDEPENDENT_AMBULATORY_CARE_PROVIDER_SITE_OTHER): Payer: Medicaid Other | Admitting: Family Medicine

## 2013-08-03 ENCOUNTER — Encounter: Payer: Self-pay | Admitting: Family Medicine

## 2013-08-03 VITALS — BP 133/82 | Temp 97.8°F | Wt 163.2 lb

## 2013-08-03 DIAGNOSIS — Z3402 Encounter for supervision of normal first pregnancy, second trimester: Secondary | ICD-10-CM

## 2013-08-03 DIAGNOSIS — O358XX1 Maternal care for other (suspected) fetal abnormality and damage, fetus 1: Secondary | ICD-10-CM

## 2013-08-03 DIAGNOSIS — O358XX0 Maternal care for other (suspected) fetal abnormality and damage, not applicable or unspecified: Secondary | ICD-10-CM

## 2013-08-03 LAB — POCT URINALYSIS DIP (DEVICE)
Glucose, UA: NEGATIVE mg/dL
Ketones, ur: NEGATIVE mg/dL
Specific Gravity, Urine: 1.025 (ref 1.005–1.030)
Urobilinogen, UA: 0.2 mg/dL (ref 0.0–1.0)

## 2013-08-03 NOTE — Progress Notes (Signed)
Pulse- 73 Patient reports lower back and pelvic pain

## 2013-08-03 NOTE — Progress Notes (Signed)
S: pt here for [redacted]w[redacted]d visit Having lots of fetal movement - no ctx, lof, vb. +FM  O: see flowsheet  A/P - still very interested in waterbirth - brought certificate from class filled out- scanned - reviewed quad results - f/u US ordered for EIF follow up  - f/u in 4 weeks

## 2013-08-04 ENCOUNTER — Encounter (HOSPITAL_COMMUNITY): Payer: Self-pay

## 2013-08-04 ENCOUNTER — Ambulatory Visit (HOSPITAL_COMMUNITY)
Admission: RE | Admit: 2013-08-04 | Discharge: 2013-08-04 | Disposition: A | Discharge status: Home / Self Care | Payer: Medicaid Other | Source: Ambulatory Visit | Attending: Family Medicine | Admitting: Family Medicine

## 2013-08-04 DIAGNOSIS — O358XX0 Maternal care for other (suspected) fetal abnormality and damage, not applicable or unspecified: Secondary | ICD-10-CM | POA: Insufficient documentation

## 2013-08-04 DIAGNOSIS — O358XX1 Maternal care for other (suspected) fetal abnormality and damage, fetus 1: Secondary | ICD-10-CM

## 2013-08-05 ENCOUNTER — Encounter: Payer: Self-pay | Admitting: *Deleted

## 2013-08-05 ENCOUNTER — Ambulatory Visit (HOSPITAL_COMMUNITY): Payer: Medicaid Other

## 2013-08-11 ENCOUNTER — Other Ambulatory Visit: Payer: Self-pay

## 2013-08-11 MED ORDER — FLUCONAZOLE 150 MG PO TABS: 150.0000 mg | ORAL_TABLET | Freq: Once | ORAL | Status: DC

## 2013-08-26 ENCOUNTER — Encounter: Payer: Self-pay | Admitting: *Deleted

## 2013-08-31 ENCOUNTER — Encounter: Payer: Self-pay | Admitting: Advanced Practice Midwife

## 2013-08-31 ENCOUNTER — Ambulatory Visit (INDEPENDENT_AMBULATORY_CARE_PROVIDER_SITE_OTHER): Payer: Medicaid Other | Admitting: Obstetrics and Gynecology

## 2013-08-31 VITALS — BP 134/75 | Wt 166.6 lb

## 2013-08-31 DIAGNOSIS — IMO0001 Reserved for inherently not codable concepts without codable children: Secondary | ICD-10-CM

## 2013-08-31 DIAGNOSIS — Z23 Encounter for immunization: Secondary | ICD-10-CM

## 2013-08-31 DIAGNOSIS — Z3401 Encounter for supervision of normal first pregnancy, first trimester: Secondary | ICD-10-CM

## 2013-08-31 DIAGNOSIS — O358XX1 Maternal care for other (suspected) fetal abnormality and damage, fetus 1: Secondary | ICD-10-CM

## 2013-08-31 DIAGNOSIS — O358XX0 Maternal care for other (suspected) fetal abnormality and damage, not applicable or unspecified: Secondary | ICD-10-CM

## 2013-08-31 DIAGNOSIS — Z3403 Encounter for supervision of normal first pregnancy, third trimester: Secondary | ICD-10-CM

## 2013-08-31 DIAGNOSIS — K589 Irritable bowel syndrome without diarrhea: Secondary | ICD-10-CM

## 2013-08-31 LAB — CBC
MCH: 30.8 pg (ref 26.0–34.0)
MCHC: 34.5 g/dL (ref 30.0–36.0)
Platelets: 277 10*3/uL (ref 150–400)
RDW: 13.6 % (ref 11.5–15.5)

## 2013-08-31 LAB — POCT URINALYSIS DIP (DEVICE)
Glucose, UA: NEGATIVE mg/dL
Specific Gravity, Urine: >=1.03 (ref 1.005–1.030)
Urobilinogen, UA: 0.2 mg/dL (ref 0.0–1.0)

## 2013-08-31 MED ORDER — EPINEPHRINE 0.3 MG/0.3ML IJ SOAJ: 0.3000 mg | Freq: Once | INTRAMUSCULAR | Status: DC

## 2013-08-31 MED ORDER — DOCUSATE SODIUM 50 MG PO CAPS: 100.0000 mg | ORAL_CAPSULE | Freq: Two times a day (BID) | ORAL | Status: DC

## 2013-08-31 MED ORDER — TETANUS-DIPHTH-ACELL PERTUSSIS 5-2.5-18.5 LF-MCG/0.5 IM SUSP: 0.5000 mL | Freq: Once | INTRAMUSCULAR | Status: DC

## 2013-08-31 MED ORDER — PRENATAL 27-0.8 MG PO TABS: 1.0000 | ORAL_TABLET | Freq: Every day | ORAL | Status: DC

## 2013-08-31 NOTE — Progress Notes (Signed)
Patient doing well without complaints. FM/PTL precautions reviewed. Patient reports occasional pain in right calf with varicosities. Patient has had similar pains in the past and inquired about treatment options. Dicussed pressure stockings and feet elevation. Discussed referral to vascular surgery if persistent. Patient states pain is manageable for now.   1hr GCT, labs and TDap today

## 2013-08-31 NOTE — Progress Notes (Signed)
Pulse- 78  Pressure- when baby moves Pt reports on swelling on right leg with varicose veins

## 2013-08-31 NOTE — Addendum Note (Signed)
Addended by: Faythe Casa on: 08/31/2013 11:31 AM   Modules accepted: Orders

## 2013-08-31 NOTE — Addendum Note (Signed)
Addended by: Candelaria Stagers E on: 08/31/2013 01:40 PM   Modules accepted: Orders

## 2013-09-01 ENCOUNTER — Encounter: Payer: Self-pay | Admitting: Obstetrics and Gynecology

## 2013-09-01 LAB — HIV ANTIBODY (ROUTINE TESTING W REFLEX): HIV: NONREACTIVE

## 2013-09-01 LAB — RPR

## 2013-09-01 LAB — GLUCOSE TOLERANCE, 1 HOUR (50G) W/O FASTING: Glucose, 1 Hour GTT: 88 mg/dL (ref 70–140)

## 2013-09-06 ENCOUNTER — Telehealth: Payer: Self-pay

## 2013-09-06 DIAGNOSIS — Z3403 Encounter for supervision of normal first pregnancy, third trimester: Secondary | ICD-10-CM

## 2013-09-06 MED ORDER — PRENATAL VITAMINS PLUS 27-1 MG PO TABS: 1.0000 | ORAL_TABLET | Freq: Every day | ORAL | Status: DC

## 2013-09-06 NOTE — Telephone Encounter (Signed)
Pt. Called and states she would like a PNV ordered and sent to her pharmacy.

## 2013-09-06 NOTE — Telephone Encounter (Signed)
Called pt. And left message stating we have changed her prescription and it is waiting for her at her pharmacy, call clinic with any questions or concerns.

## 2013-09-13 ENCOUNTER — Encounter: Payer: Self-pay | Admitting: *Deleted

## 2013-09-21 ENCOUNTER — Other Ambulatory Visit: Payer: Self-pay

## 2013-09-21 MED ORDER — FLUCONAZOLE 150 MG PO TABS: 150.0000 mg | ORAL_TABLET | Freq: Every day | ORAL | Status: AC

## 2013-09-21 NOTE — Telephone Encounter (Signed)
Pt c/o white discharge with itchiness and irriation.  Per Dr. Erin Fulling pt should take diflucan 150 mg po daily for 3 days.

## 2013-09-28 ENCOUNTER — Encounter: Payer: Self-pay | Admitting: Obstetrics & Gynecology

## 2013-09-28 ENCOUNTER — Ambulatory Visit (INDEPENDENT_AMBULATORY_CARE_PROVIDER_SITE_OTHER): Payer: Medicaid Other | Admitting: Obstetrics & Gynecology

## 2013-09-28 VITALS — BP 126/71 | Temp 97.3°F | Wt 172.0 lb

## 2013-09-28 DIAGNOSIS — O9989 Other specified diseases and conditions complicating pregnancy, childbirth and the puerperium: Secondary | ICD-10-CM

## 2013-09-28 DIAGNOSIS — R51 Headache: Secondary | ICD-10-CM

## 2013-09-28 DIAGNOSIS — O26899 Other specified pregnancy related conditions, unspecified trimester: Secondary | ICD-10-CM

## 2013-09-28 LAB — POCT URINALYSIS DIP (DEVICE)
Bilirubin Urine: NEGATIVE
Glucose, UA: NEGATIVE mg/dL
Ketones, ur: NEGATIVE mg/dL
NITRITE: NEGATIVE
PH: 5.5 (ref 5.0–8.0)
PROTEIN: NEGATIVE mg/dL
Specific Gravity, Urine: >=1.03 (ref 1.005–1.030)
Urobilinogen, UA: 0.2 mg/dL (ref 0.0–1.0)

## 2013-09-28 NOTE — Progress Notes (Signed)
Pt with no complaints Reviewed safety measures. Reviewed 28 weeks labs

## 2013-09-28 NOTE — Patient Instructions (Signed)
Breastfeeding Deciding to breastfeed is one of the best choices you can make for you and your baby. A change in hormones during pregnancy causes your breast tissue to grow and increases the number and size of your milk ducts. These hormones also allow proteins, sugars, and fats from your blood supply to make breast milk in your milk-producing glands. Hormones prevent breast milk from being released before your baby is born as well as prompt milk flow after birth. Once breastfeeding has begun, thoughts of your baby, as well as his or her sucking or crying, can stimulate the release of milk from your milk-producing glands.  BENEFITS OF BREASTFEEDING For Your Baby  Your first milk (colostrum) helps your baby's digestive system function better.   There are antibodies in your milk that help your baby fight off infections.   Your baby has a lower incidence of asthma, allergies, and sudden infant death syndrome.   The nutrients in breast milk are better for your baby than infant formulas and are designed uniquely for your baby's needs.   Breast milk improves your baby's brain development.   Your baby is less likely to develop other conditions, such as childhood obesity, asthma, or type 2 diabetes mellitus.  For You   Breastfeeding helps to create a very special bond between you and your baby.   Breastfeeding is convenient. Breast milk is always available at the correct temperature and costs nothing.   Breastfeeding helps to burn calories and helps you lose the weight gained during pregnancy.   Breastfeeding makes your uterus contract to its prepregnancy size faster and slows bleeding (lochia) after you give birth.   Breastfeeding helps to lower your risk of developing type 2 diabetes mellitus, osteoporosis, and breast or ovarian cancer later in life. SIGNS THAT YOUR BABY IS HUNGRY Early Signs of Hunger  Increased alertness or activity.  Stretching.  Movement of the head from  side to side.  Movement of the head and opening of the mouth when the corner of the mouth or cheek is stroked (rooting).  Increased sucking sounds, smacking lips, cooing, sighing, or squeaking.  Hand-to-mouth movements.  Increased sucking of fingers or hands. Late Signs of Hunger  Fussing.  Intermittent crying. Extreme Signs of Hunger Signs of extreme hunger will require calming and consoling before your baby will be able to breastfeed successfully. Do not wait for the following signs of extreme hunger to occur before you initiate breastfeeding:   Restlessness.  A loud, strong cry.   Screaming. BREASTFEEDING BASICS Breastfeeding Initiation  Find a comfortable place to sit or lie down, with your neck and back well supported.  Place a pillow or rolled up blanket under your baby to bring him or her to the level of your breast (if you are seated). Nursing pillows are specially designed to help support your arms and your baby while you breastfeed.  Make sure that your baby's abdomen is facing your abdomen.   Gently massage your breast. With your fingertips, massage from your chest wall toward your nipple in a circular motion. This encourages milk flow. You may need to continue this action during the feeding if your milk flows slowly.  Support your breast with 4 fingers underneath and your thumb above your nipple. Make sure your fingers are well away from your nipple and your baby's mouth.   Stroke your baby's lips gently with your finger or nipple.   When your baby's mouth is open wide enough, quickly bring your baby to your  breast, placing your entire nipple and as much of the colored area around your nipple (areola) as possible into your baby's mouth.   More areola should be visible above your baby's upper lip than below the lower lip.   Your baby's tongue should be between his or her lower gum and your breast.   Ensure that your baby's mouth is correctly positioned  around your nipple (latched). Your baby's lips should create a seal on your breast and be turned out (everted).  It is common for your baby to suck about 2 3 minutes in order to start the flow of breast milk. Latching Teaching your baby how to latch on to your breast properly is very important. An improper latch can cause nipple pain and decreased milk supply for you and poor weight gain in your baby. Also, if your baby is not latched onto your nipple properly, he or she may swallow some air during feeding. This can make your baby fussy. Burping your baby when you switch breasts during the feeding can help to get rid of the air. However, teaching your baby to latch on properly is still the best way to prevent fussiness from swallowing air while breastfeeding. Signs that your baby has successfully latched on to your nipple:    Silent tugging or silent sucking, without causing you pain.   Swallowing heard between every 3 4 sucks.    Muscle movement above and in front of his or her ears while sucking.  Signs that your baby has not successfully latched on to nipple:   Sucking sounds or smacking sounds from your baby while breastfeeding.  Nipple pain. If you think your baby has not latched on correctly, slip your finger into the corner of your baby's mouth to break the suction and place it between your baby's gums. Attempt breastfeeding initiation again. Signs of Successful Breastfeeding Signs from your baby:   A gradual decrease in the number of sucks or complete cessation of sucking.   Falling asleep.   Relaxation of his or her body.   Retention of a small amount of milk in his or her mouth.   Letting go of your breast by himself or herself. Signs from you:  Breasts that have increased in firmness, weight, and size 1 3 hours after feeding.   Breasts that are softer immediately after breastfeeding.  Increased milk volume, as well as a change in milk consistency and color by  the 5th day of breastfeeding.   Nipples that are not sore, cracked, or bleeding. Signs That Your Randel Books is Getting Enough Milk  Wetting at least 3 diapers in a 24-hour period. The urine should be clear and pale yellow by age 64411 days.  At least 3 stools in a 24-hour period by age 64411 days. The stool should be soft and yellow.  At least 3 stools in a 24-hour period by age 644 days. The stool should be seedy and yellow.  No loss of weight greater than 10% of birth weight during the first 22 days of age.  Average weight gain of 4 7 ounces (120 210 mL) per week after age 64 days.  Consistent daily weight gain by age 60 days, without weight loss after the age of 2 weeks. After a feeding, your baby may spit up a small amount. This is common. BREASTFEEDING FREQUENCY AND DURATION Frequent feeding will help you make more milk and can prevent sore nipples and breast engorgement. Breastfeed when you feel the need to reduce  the fullness of your breasts or when your baby shows signs of hunger. This is called "breastfeeding on demand." Avoid introducing a pacifier to your baby while you are working to establish breastfeeding (the first 4 6 weeks after your baby is born). After this time you may choose to use a pacifier. Research has shown that pacifier use during the first year of a baby's life decreases the risk of sudden infant death syndrome (SIDS). Allow your baby to feed on each breast as long as he or she wants. Breastfeed until your baby is finished feeding. When your baby unlatches or falls asleep while feeding from the first breast, offer the second breast. Because newborns are often sleepy in the first few weeks of life, you may need to awaken your baby to get him or her to feed. Breastfeeding times will vary from baby to baby. However, the following rules can serve as a guide to help you ensure that your baby is properly fed:  Newborns (babies 4 weeks of age or younger) may breastfeed every 1 3  hours.  Newborns should not go longer than 3 hours during the day or 5 hours during the night without breastfeeding.  You should breastfeed your baby a minimum of 8 times in a 24-hour period until you begin to introduce solid foods to your baby at around 6 months of age. BREAST MILK PUMPING Pumping and storing breast milk allows you to ensure that your baby is exclusively fed your breast milk, even at times when you are unable to breastfeed. This is especially important if you are going back to work while you are still breastfeeding or when you are not able to be present during feedings. Your lactation consultant can give you guidelines on how long it is safe to store breast milk.  A breast pump is a machine that allows you to pump milk from your breast into a sterile bottle. The pumped breast milk can then be stored in a refrigerator or freezer. Some breast pumps are operated by hand, while others use electricity. Ask your lactation consultant which type will work best for you. Breast pumps can be purchased, but some hospitals and breastfeeding support groups lease breast pumps on a monthly basis. A lactation consultant can teach you how to hand express breast milk, if you prefer not to use a pump.  CARING FOR YOUR BREASTS WHILE YOU BREASTFEED Nipples can become dry, cracked, and sore while breastfeeding. The following recommendations can help keep your breasts moisturized and healthy:  Avoid using soap on your nipples.   Wear a supportive bra. Although not required, special nursing bras and tank tops are designed to allow access to your breasts for breastfeeding without taking off your entire bra or top. Avoid wearing underwire style bras or extremely tight bras.  Air dry your nipples for 3 4minutes after each feeding.   Use only cotton bra pads to absorb leaked breast milk. Leaking of breast milk between feedings is normal.   Use lanolin on your nipples after breastfeeding. Lanolin helps to  maintain your skin's normal moisture barrier. If you use pure lanolin you do not need to wash it off before feeding your baby again. Pure lanolin is not toxic to your baby. You may also hand express a few drops of breast milk and gently massage that milk into your nipples and allow the milk to air dry. In the first few weeks after giving birth, some women experience extremely full breasts (engorgement). Engorgement can make   your breasts feel heavy, warm, and tender to the touch. Engorgement peaks within 3 5 days after you give birth. The following recommendations can help ease engorgement:  Completely empty your breasts while breastfeeding or pumping. You may want to start by applying warm, moist heat (in the shower or with warm water-soaked hand towels) just before feeding or pumping. This increases circulation and helps the milk flow. If your baby does not completely empty your breasts while breastfeeding, pump any extra milk after he or she is finished.  Wear a snug bra (nursing or regular) or tank top for 1 2 days to signal your body to slightly decrease milk production.  Apply ice packs to your breasts, unless this is too uncomfortable for you.  Make sure that your baby is latched on and positioned properly while breastfeeding. If engorgement persists after 48 hours of following these recommendations, contact your health care provider or a Science writer. OVERALL HEALTH CARE RECOMMENDATIONS WHILE BREASTFEEDING  Eat healthy foods. Alternate between meals and snacks, eating 3 of each per day. Because what you eat affects your breast milk, some of the foods may make your baby more irritable than usual. Avoid eating these foods if you are sure that they are negatively affecting your baby.  Drink milk, fruit juice, and water to satisfy your thirst (about 10 glasses a day).   Rest often, relax, and continue to take your prenatal vitamins to prevent fatigue, stress, and anemia.  Continue  breast self-awareness checks.  Avoid chewing and smoking tobacco.  Avoid alcohol and drug use. Some medicines that may be harmful to your baby can pass through breast milk. It is important to ask your health care provider before taking any medicine, including all over-the-counter and prescription medicine as well as vitamin and herbal supplements. It is possible to become pregnant while breastfeeding. If birth control is desired, ask your health care provider about options that will be safe for your baby. SEEK MEDICAL CARE IF:   You feel like you want to stop breastfeeding or have become frustrated with breastfeeding.  You have painful breasts or nipples.  Your nipples are cracked or bleeding.  Your breasts are red, tender, or warm.  You have a swollen area on either breast.  You have a fever or chills.  You have nausea or vomiting.  You have drainage other than breast milk from your nipples.  Your breasts do not become full before feedings by the 5th day after you give birth.  You feel sad and depressed.  Your baby is too sleepy to eat well.  Your baby is having trouble sleeping.   Your baby is wetting less than 3 diapers in a 24-hour period.  Your baby has less than 3 stools in a 24-hour period.  Your baby's skin or the white part of his or her eyes becomes yellow.   Your baby is not gaining weight by 74 days of age. SEEK IMMEDIATE MEDICAL CARE IF:   Your baby is overly tired (lethargic) and does not want to wake up and feed.  Your baby develops an unexplained fever. Document Released: 09/09/2005 Document Revised: 05/12/2013 Document Reviewed: 03/03/2013 [facility] Patient Information 2014 [place].

## 2013-09-28 NOTE — Progress Notes (Signed)
p=82

## 2013-10-08 ENCOUNTER — Encounter (HOSPITAL_COMMUNITY): Payer: Self-pay | Admitting: Emergency Medicine

## 2013-10-08 ENCOUNTER — Telehealth (HOSPITAL_COMMUNITY): Payer: Self-pay | Admitting: Family Medicine

## 2013-10-08 ENCOUNTER — Emergency Department (INDEPENDENT_AMBULATORY_CARE_PROVIDER_SITE_OTHER)
Admission: EM | Admit: 2013-10-08 | Discharge: 2013-10-08 | Disposition: A | Discharge status: Home / Self Care | Payer: Medicaid Other | Source: Home / Self Care | Attending: Family Medicine | Admitting: Family Medicine

## 2013-10-08 DIAGNOSIS — J069 Acute upper respiratory infection, unspecified: Secondary | ICD-10-CM

## 2013-10-08 DIAGNOSIS — Z331 Pregnant state, incidental: Secondary | ICD-10-CM

## 2013-10-08 DIAGNOSIS — Z3400 Encounter for supervision of normal first pregnancy, unspecified trimester: Secondary | ICD-10-CM

## 2013-10-08 LAB — INFLUENZA PANEL BY PCR (TYPE A & B)
H1N1 flu by pcr: NOT DETECTED
INFLBPCR: NEGATIVE
Influenza A By PCR: NEGATIVE

## 2013-10-08 LAB — POCT RAPID STREP A: Streptococcus, Group A Screen (Direct): NEGATIVE

## 2013-10-08 MED ORDER — ANTIPYRINE-BENZOCAINE 5.4-1.4 % OT SOLN: 3.0000 [drp] | OTIC | Status: DC | PRN

## 2013-10-08 MED ORDER — IPRATROPIUM BROMIDE 0.06 % NA SOLN
2.0000 | Freq: Four times a day (QID) | NASAL | Status: DC
Start: 2013-10-08 — End: 2013-11-03

## 2013-10-08 NOTE — Discharge Instructions (Signed)
Thank you for coming in today. Use the nasal spray as needed,.  Use th ear drops for pain as needed,.  Continue tylenol for symptoms.  I will call you with your flu results.  Call or go to the emergency room if you get worse, have trouble breathing, have chest pains, or palpitations.

## 2013-10-08 NOTE — ED Provider Notes (Signed)
Rhonda Graves is a 33 y.o. female who presents to Urgent Care today for sore throat ear pain body aches and headaches. The symptoms started yesterday evening. Patient has tried Tylenol which helps a little. Her has been recently got over a one-week long illness that was similar but severe. She suspects that he had influenza. Both her and her husband have the flu vaccination this year. She denies any current fevers or chills nausea vomiting or diarrhea. However complicating her history is current pregnancy at [redacted] weeks gestation. She is not having any complications this pregnancy.   Past Medical History  Diagnosis Date  . Headache(784.0)     migraine  . IBS (irritable bowel syndrome)   . Multiple allergies   . GERD (gastroesophageal reflux disease)   . Abnormal Pap smear     had colpo in past, normal pap since then   History  Substance Use Topics  . Smoking status: Never Smoker   . Smokeless tobacco: Never Used  . Alcohol Use: Yes     Comment: Not currently drinking   ROS as above Medications: No current facility-administered medications for this encounter.   Current Outpatient Prescriptions  Medication Sig Dispense Refill  . acetaminophen (TYLENOL) 325 MG tablet Take 650 mg by mouth every 6 (six) hours as needed for pain.      . Prenatal Vit-Fe Fumarate-FA (PRENATAL VITAMINS PLUS) 27-1 MG TABS Take 1 tablet by mouth daily.  30 tablet  12  . antipyrine-benzocaine (AURALGAN) otic solution Place 3-4 drops into the right ear every 2 (two) hours as needed for ear pain.  10 mL  0  . calcium carbonate (TUMS - DOSED IN MG ELEMENTAL CALCIUM) 500 MG chewable tablet Chew 1 tablet by mouth daily.      . Cetirizine HCl (ZYRTEC ALLERGY) 10 MG CAPS Take 1 capsule by mouth daily.      Marland Kitchen docusate sodium (COLACE) 50 MG capsule Take 2 capsules (100 mg total) by mouth 2 (two) times daily.  30 capsule  1  . EPINEPHrine (EPI-PEN) 0.3 mg/0.3 mL SOAJ injection Inject 0.3 mLs (0.3 mg total) into the muscle once.   1 Device  0  . ipratropium (ATROVENT) 0.06 % nasal spray Place 2 sprays into both nostrils 4 (four) times daily.  15 mL  1  . Melatonin 1 MG TABS Take 1 tablet by mouth at bedtime as needed.      . Prenatal Vit-Fe Fumarate-FA (MULTIVITAMIN-PRENATAL) 27-0.8 MG TABS tablet Take 1 tablet by mouth daily at 12 noon.  30 each  0  . ranitidine (ZANTAC) 150 MG tablet Take 150 mg by mouth daily.        Exam:  BP 118/67  Pulse 78  Temp(Src) 98.3 F (36.8 C) (Oral)  Resp 18  SpO2 100%  LMP 02/16/2013 Gen: Well NAD HEENT: EOMI,  MMM posterior pharynx with cobblestoning. Tympanic membranes are retracted on the right and normal on the left. Lungs: Normal work of breathing. CTABL Heart: RRR no MRG Abd: NABS, Soft. NT, ND gravid Exts: Brisk capillary refill, warm and well perfused.   Results for orders placed during the hospital encounter of 10/08/13 (from the past 24 hour(s))  POCT RAPID STREP A ([place])     Status: None   Collection Time    10/08/13  8:52 AM      Result Value Range   Streptococcus, Group A Screen (Direct) NEGATIVE  NEGATIVE   No results found.  Assessment and Plan: 33 y.o.  female with viral illness possibly influenza. Influenza panel via PCR is currently pending. We'll treat symptoms with Atrovent nasal spray and Auralgan ear drops. If the patient does have influenza I will call in Tamiflu. Will call patient with results  Discussed warning signs or symptoms. Please see discharge instructions. Patient expresses understanding.    Gregor Hams, MD 10/08/13 [phone]

## 2013-10-08 NOTE — ED Notes (Signed)
I called the patient to inform her of negative influenza test.  Gregor Hams, MD 10/08/13 1246

## 2013-10-08 NOTE — ED Notes (Signed)
Triaged by jones, cma student 

## 2013-10-08 NOTE — ED Notes (Signed)
C/o sore throat, congestion, nonproductive cough that started yesterday. Pain is 5/10. Also c/o right ear pain. Denies any n/v/d. States that she took Tylenol at 7 am with no relief. Stated that husband was sick prior. Written by: Lenore Manner, SMA

## 2013-10-10 LAB — CULTURE, GROUP A STREP

## 2013-10-12 ENCOUNTER — Encounter: Payer: Self-pay | Admitting: Obstetrics and Gynecology

## 2013-10-12 ENCOUNTER — Ambulatory Visit (INDEPENDENT_AMBULATORY_CARE_PROVIDER_SITE_OTHER): Payer: Medicaid Other | Admitting: Obstetrics and Gynecology

## 2013-10-12 VITALS — BP 134/65 | Temp 96.9°F | Wt 175.0 lb

## 2013-10-12 DIAGNOSIS — IMO0002 Reserved for concepts with insufficient information to code with codable children: Secondary | ICD-10-CM

## 2013-10-12 DIAGNOSIS — K589 Irritable bowel syndrome without diarrhea: Secondary | ICD-10-CM

## 2013-10-12 DIAGNOSIS — Z3400 Encounter for supervision of normal first pregnancy, unspecified trimester: Secondary | ICD-10-CM

## 2013-10-12 DIAGNOSIS — R51 Headache: Secondary | ICD-10-CM

## 2013-10-12 DIAGNOSIS — O358XX0 Maternal care for other (suspected) fetal abnormality and damage, not applicable or unspecified: Secondary | ICD-10-CM

## 2013-10-12 DIAGNOSIS — O26899 Other specified pregnancy related conditions, unspecified trimester: Secondary | ICD-10-CM

## 2013-10-12 DIAGNOSIS — O9989 Other specified diseases and conditions complicating pregnancy, childbirth and the puerperium: Secondary | ICD-10-CM

## 2013-10-12 LAB — POCT URINALYSIS DIP (DEVICE)
BILIRUBIN URINE: NEGATIVE
GLUCOSE, UA: NEGATIVE mg/dL
Ketones, ur: NEGATIVE mg/dL
Nitrite: NEGATIVE
Protein, ur: NEGATIVE mg/dL
Specific Gravity, Urine: 1.02 (ref 1.005–1.030)
UROBILINOGEN UA: 0.2 mg/dL (ref 0.0–1.0)
pH: 6 (ref 5.0–8.0)

## 2013-10-12 NOTE — Progress Notes (Signed)
Patient is doing well without any complaints. FM/PLTL precautions reviewed. Supportive interventions for cold symptoms discussed. Cultures next visit

## 2013-10-12 NOTE — Progress Notes (Signed)
P=70, c/o having a cold . C[/o period like cramps 2-3 times, mild

## 2013-10-26 ENCOUNTER — Encounter: Payer: Self-pay | Admitting: Obstetrics and Gynecology

## 2013-10-26 ENCOUNTER — Ambulatory Visit (INDEPENDENT_AMBULATORY_CARE_PROVIDER_SITE_OTHER): Payer: Medicaid Other | Admitting: Obstetrics and Gynecology

## 2013-10-26 VITALS — BP 138/78 | Temp 96.8°F | Wt 177.1 lb

## 2013-10-26 DIAGNOSIS — Z3400 Encounter for supervision of normal first pregnancy, unspecified trimester: Secondary | ICD-10-CM

## 2013-10-26 LAB — OB RESULTS CONSOLE GC/CHLAMYDIA
Chlamydia: NEGATIVE
Gonorrhea: NEGATIVE

## 2013-10-26 LAB — POCT URINALYSIS DIP (DEVICE)
BILIRUBIN URINE: NEGATIVE
GLUCOSE, UA: NEGATIVE mg/dL
HGB URINE DIPSTICK: NEGATIVE
NITRITE: NEGATIVE
Protein, ur: NEGATIVE mg/dL
Specific Gravity, Urine: 1.02 (ref 1.005–1.030)
Urobilinogen, UA: 0.2 mg/dL (ref 0.0–1.0)
pH: 6.5 (ref 5.0–8.0)

## 2013-10-26 LAB — OB RESULTS CONSOLE GBS: STREP GROUP B AG: NEGATIVE

## 2013-10-26 NOTE — Addendum Note (Signed)
Addended by: Ernie Avena on: 10/26/2013 03:58 PM   Modules accepted: Orders

## 2013-10-26 NOTE — Progress Notes (Signed)
URI sx resolved. Cultures done. S/sx labor reviewed. Still wants waterbirth.  Feels breech by abd and pelvic exam. PP high. Interested in version if remains breech. D/W Dr. Harolyn Rutherford schedule with MD next Tues.

## 2013-10-26 NOTE — Progress Notes (Signed)
p-80 

## 2013-10-27 ENCOUNTER — Encounter: Payer: Medicaid Other | Admitting: Family Medicine

## 2013-10-27 ENCOUNTER — Encounter: Payer: Medicaid Other | Admitting: Obstetrics and Gynecology

## 2013-10-27 LAB — GC/CHLAMYDIA PROBE AMP
CT Probe RNA: NEGATIVE
GC PROBE AMP APTIMA: NEGATIVE

## 2013-10-28 LAB — CULTURE, BETA STREP (GROUP B ONLY)

## 2013-11-02 ENCOUNTER — Encounter: Payer: Medicaid Other | Admitting: Family Medicine

## 2013-11-03 ENCOUNTER — Ambulatory Visit (INDEPENDENT_AMBULATORY_CARE_PROVIDER_SITE_OTHER): Payer: Medicaid Other | Admitting: Obstetrics and Gynecology

## 2013-11-03 ENCOUNTER — Encounter: Payer: Self-pay | Admitting: Obstetrics and Gynecology

## 2013-11-03 ENCOUNTER — Telehealth (HOSPITAL_COMMUNITY): Payer: Self-pay | Admitting: *Deleted

## 2013-11-03 ENCOUNTER — Encounter (HOSPITAL_COMMUNITY): Payer: Self-pay | Admitting: *Deleted

## 2013-11-03 VITALS — BP 121/88 | Wt 177.0 lb

## 2013-11-03 DIAGNOSIS — R51 Headache: Secondary | ICD-10-CM

## 2013-11-03 DIAGNOSIS — O321XX0 Maternal care for breech presentation, not applicable or unspecified: Secondary | ICD-10-CM

## 2013-11-03 DIAGNOSIS — O9989 Other specified diseases and conditions complicating pregnancy, childbirth and the puerperium: Secondary | ICD-10-CM

## 2013-11-03 DIAGNOSIS — O26899 Other specified pregnancy related conditions, unspecified trimester: Secondary | ICD-10-CM

## 2013-11-03 DIAGNOSIS — Z3400 Encounter for supervision of normal first pregnancy, unspecified trimester: Secondary | ICD-10-CM

## 2013-11-03 DIAGNOSIS — R519 Headache, unspecified: Secondary | ICD-10-CM

## 2013-11-03 DIAGNOSIS — K589 Irritable bowel syndrome without diarrhea: Secondary | ICD-10-CM

## 2013-11-03 NOTE — Progress Notes (Signed)
Patient is doing well without complaints. Reports occasional [place]. Bedside ultrasound shows patient in frank breech presentation. Discussed ECV and patient is interested. FM/labor precautions reviewed.

## 2013-11-03 NOTE — Telephone Encounter (Signed)
Preadmission screen  

## 2013-11-03 NOTE — Progress Notes (Signed)
p-92

## 2013-11-05 ENCOUNTER — Inpatient Hospital Stay (HOSPITAL_COMMUNITY)
Admission: RE | Admit: 2013-11-05 | Discharge: 2013-11-05 | DRG: 781 | Disposition: A | Discharge status: Home / Self Care | Payer: Medicaid Other | Source: Ambulatory Visit | Attending: Family Medicine | Admitting: Family Medicine

## 2013-11-05 ENCOUNTER — Encounter (HOSPITAL_COMMUNITY): Payer: Self-pay

## 2013-11-05 VITALS — BP 128/79 | HR 90 | Temp 98.0°F | Resp 18

## 2013-11-05 DIAGNOSIS — K589 Irritable bowel syndrome without diarrhea: Secondary | ICD-10-CM | POA: Diagnosis present

## 2013-11-05 DIAGNOSIS — O99891 Other specified diseases and conditions complicating pregnancy: Secondary | ICD-10-CM | POA: Diagnosis present

## 2013-11-05 DIAGNOSIS — K219 Gastro-esophageal reflux disease without esophagitis: Secondary | ICD-10-CM | POA: Diagnosis present

## 2013-11-05 DIAGNOSIS — O9989 Other specified diseases and conditions complicating pregnancy, childbirth and the puerperium: Secondary | ICD-10-CM

## 2013-11-05 DIAGNOSIS — O321XX0 Maternal care for breech presentation, not applicable or unspecified: Principal | ICD-10-CM | POA: Diagnosis present

## 2013-11-05 DIAGNOSIS — IMO0002 Reserved for concepts with insufficient information to code with codable children: Secondary | ICD-10-CM

## 2013-11-05 MED ORDER — CITRIC ACID-SODIUM CITRATE 334-500 MG/5ML PO SOLN: 30.0000 mL | ORAL | Status: DC | PRN

## 2013-11-05 MED ORDER — OXYTOCIN 40 UNITS IN LACTATED RINGERS INFUSION - SIMPLE MED: 62.5000 mL/h | INTRAVENOUS | Status: DC

## 2013-11-05 MED ORDER — TERBUTALINE SULFATE 1 MG/ML IJ SOLN
0.2500 mg | Freq: Once | INTRAMUSCULAR | Status: AC
  Administered 2013-11-05: 0.25 mg via SUBCUTANEOUS

## 2013-11-05 MED ORDER — IBUPROFEN 600 MG PO TABS: 600.0000 mg | ORAL_TABLET | Freq: Four times a day (QID) | ORAL | Status: DC | PRN

## 2013-11-05 MED ORDER — OXYTOCIN BOLUS FROM INFUSION: 500.0000 mL | INTRAVENOUS | Status: DC

## 2013-11-05 MED ORDER — OXYCODONE-ACETAMINOPHEN 5-325 MG PO TABS: 1.0000 | ORAL_TABLET | ORAL | Status: DC | PRN

## 2013-11-05 MED ORDER — LIDOCAINE HCL (PF) 1 % IJ SOLN: 30.0000 mL | INTRAMUSCULAR | Status: DC | PRN

## 2013-11-05 MED ORDER — ONDANSETRON HCL 4 MG/2ML IJ SOLN: 4.0000 mg | Freq: Four times a day (QID) | INTRAMUSCULAR | Status: DC | PRN

## 2013-11-05 MED ORDER — FLEET ENEMA 7-19 GM/118ML RE ENEM: 1.0000 | ENEMA | RECTAL | Status: DC | PRN

## 2013-11-05 MED ORDER — LACTATED RINGERS IV SOLN: 500.0000 mL | INTRAVENOUS | Status: DC | PRN

## 2013-11-05 MED ORDER — LACTATED RINGERS IV SOLN
INTRAVENOUS | Status: DC
  Administered 2013-11-05: 08:00:00 via INTRAVENOUS

## 2013-11-05 MED ORDER — TERBUTALINE SULFATE 1 MG/ML IJ SOLN
INTRAMUSCULAR | Status: AC
  Administered 2013-11-05: 0.25 mg via SUBCUTANEOUS
  Filled 2013-11-05: qty 1

## 2013-11-05 MED ORDER — ACETAMINOPHEN 325 MG PO TABS: 650.0000 mg | ORAL_TABLET | ORAL | Status: DC | PRN

## 2013-11-05 NOTE — H&P (Signed)
LABOR ADMISSION HISTORY AND PHYSICAL   Rhonda Graves is a 33 y.o. female G1P0 with IUP at [redacted]w[redacted]d presenting for external version.   Pt was seen for [facility] visit at 34 weeks and noted on bedside US that the fetus was frank breech.  She denies any changes in position, ctx, LOF, VB.  +FM.   [facility] 1st [facility]. No complications.   Prenatal History/Complications:  Past Medical History: Past Medical History  Diagnosis Date  . Headache(784.0)     migraine  . IBS (irritable bowel syndrome)   . Multiple allergies   . GERD (gastroesophageal reflux disease)   . Abnormal Pap smear     had colpo in past, normal pap since then    Past Surgical History: Past Surgical History  Procedure Laterality Date  . No past surgeries      Obstetrical History: OB History   Grav Para Term Preterm Abortions TAB SAB Ect Mult Living   1         0      Social History: History   Social History  . Marital Status: Married    Spouse Name: N/A    Number of Children: N/A  . Years of Education: N/A   Social History Main Topics  . Smoking status: Never Smoker   . Smokeless tobacco: Never Used  . Alcohol Use: Yes     Comment: Not currently drinking  . Drug Use: No  . Sexual Activity: Yes    Birth Control/ Protection: None   Other Topics Concern  . None   Social History Narrative  . None    Family History: Family History  Problem Relation Age of Onset  . Arthritis Mother   . Hypothyroidism Mother   . Asthma Brother     Allergies: Allergies  Allergen Reactions  . Other Anaphylaxis    Walnuts  . 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 Peanut-Containing Drug Products Hives    Prescriptions prior to admission  Medication Sig Dispense Refill  . acetaminophen (TYLENOL) 325 MG tablet Take 650 mg by mouth every 6 (six) hours as needed for pain.      . Cetirizine HCl (ZYRTEC ALLERGY) 10 MG CAPS Take 1 capsule by mouth daily.      . Prenatal Vit-Fe Fumarate-FA (PRENATAL  MULTIVITAMIN) TABS tablet Take 1 tablet by mouth every evening.      . ranitidine (ZANTAC) 150 MG tablet Take 150 mg by mouth every evening.       Marland Kitchen EPINEPHrine (EPI-PEN) 0.3 mg/0.3 mL SOAJ injection Inject 0.3 mLs (0.3 mg total) into the muscle once.  1 Device  0     Review of Systems  All systems reviewed and negative except as stated in HPI    Blood pressure 127/79, pulse 71, temperature 98 F (36.7 C), temperature source Oral, resp. rate 18, last menstrual period 02/16/2013. General appearance: alert, cooperative and no distress Lungs: clear to auscultation bilaterally Heart: regular rate and rhythm Abdomen: soft, non-tender; bowel sounds normal Extremities: Homans sign is negative, no sign of DVT  Presentation: breech Fetal monitoringBaseline: 140 bpm, Variability: Good {> 6 bpm), Accelerations: Reactive and Decelerations: Absent Uterine activityNone     Prenatal labs: ABO, Rh: A/POS/-- (07/17 0932) Antibody: NEG (07/17 0932) Rubella:   RPR: NON REAC (12/09 1103)  HBsAg: NEGATIVE (07/17 0932)  HIV: NON REACTIVE (12/09 1103)  GBS: Negative (02/03 0000)  1 hr Glucola 88 Genetic screening  Quad neg Anatomy US echogenic cardiac focus  Assessment: Rhonda Graves is a 33 y.o. G1P0 with an IUP at [redacted]w[redacted]d presenting for external version   Plan: Risks and benefits discussed including pain, risk of failure, drop in fetal HR including need for emergent c/s.   Pt agreed to the procedure and consent was signed.  After informed verbal consent, Terbutaline 0.25 mg SQ given, ECV was attempted by Dr. Kennon Rounds under Ultrasound guidance.  Baby was rotated forward from frank breech to vertex presentation.    FHR was reactive before and after the procedure.   Pt tolerated the procedure well.  She will be observed for the next hour and if stable, discharge to home   Rhonda Graves, Rhonda Pancoast, MD 11/05/2013, 9:16 AM

## 2013-11-05 NOTE — H&P (Signed)
ECV done by me with forward roll x 1.  Easily turned.  FHR reactive.  Plan for discharge after 1 hour of nml strip.

## 2013-11-05 NOTE — Discharge Summary (Signed)
[facility] Attending Discharge Summary  33 y.o. G1P0 at [redacted]w[redacted]d admitted for external cephalic version for breech presentation.  Procedure Note by Dr. Kennon Rounds: ECV done by me with forward roll x 1. Easily turned. FHR reactive. Plan for discharge after 1 hour of nml strip.   Reactive FHR tracing noted after successful ECV over one hour.  Patient was discharged to home, will follow up in clinic as scheduled.  Fetal movement and labor precautions reviewed.   Verita Schneiders, MD, [place] Attending [place], [place]

## 2013-11-05 NOTE — Progress Notes (Signed)
Version complete

## 2013-11-05 NOTE — Progress Notes (Signed)
Patient is here for a version. She is accepting and ready for the procedure.

## 2013-11-05 NOTE — Progress Notes (Signed)
Dr Olevia Bowens and Dr Kennon Rounds at bedside to try a version.

## 2013-11-09 ENCOUNTER — Ambulatory Visit (INDEPENDENT_AMBULATORY_CARE_PROVIDER_SITE_OTHER): Payer: Medicaid Other | Admitting: Family Medicine

## 2013-11-09 ENCOUNTER — Encounter: Payer: Self-pay | Admitting: Family Medicine

## 2013-11-09 VITALS — BP 115/71 | Wt 180.0 lb

## 2013-11-09 DIAGNOSIS — Z3400 Encounter for supervision of normal first pregnancy, unspecified trimester: Secondary | ICD-10-CM

## 2013-11-09 NOTE — Progress Notes (Signed)
P-90 

## 2013-11-09 NOTE — Patient Instructions (Signed)
Third Trimester of Pregnancy The third trimester is from week 29 through week 42, months 7 through 9. The third trimester is a time when the fetus is growing rapidly. At the end of the ninth month, the fetus is about 20 inches in length and weighs 6 10 pounds.  BODY CHANGES Your body goes through many changes during pregnancy. The changes vary from woman to woman.   Your weight will continue to increase. You can expect to gain 25 35 pounds (11 16 kg) by the end of the pregnancy.  You may begin to get stretch marks on your hips, abdomen, and breasts.  You may urinate more often because the fetus is moving lower into your pelvis and pressing on your bladder.  You may develop or continue to have heartburn as a result of your pregnancy.  You may develop constipation because certain hormones are causing the muscles that push waste through your intestines to slow down.  You may develop hemorrhoids or swollen, bulging veins (varicose veins).  You may have pelvic pain because of the weight gain and pregnancy hormones relaxing your joints between the bones in your pelvis. Back aches may result from over exertion of the muscles supporting your posture.  Your breasts will continue to grow and be tender. A yellow discharge may leak from your breasts called colostrum.  Your belly button may stick out.  You may feel short of breath because of your expanding uterus.  You may notice the fetus "dropping," or moving lower in your abdomen.  You may have a bloody mucus discharge. This usually occurs a few days to a week before labor begins.  Your cervix becomes thin and soft (effaced) near your due date. WHAT TO EXPECT AT YOUR PRENATAL EXAMS  You will have prenatal exams every 2 weeks until week 36. Then, you will have weekly prenatal exams. During a routine prenatal visit:  You will be weighed to make sure you and the fetus are growing normally.  Your blood pressure is taken.  Your abdomen will  be measured to track your baby's growth.  The fetal heartbeat will be listened to.  Any test results from the previous visit will be discussed.  You may have a cervical check near your due date to see if you have effaced. At around 36 weeks, your caregiver will check your cervix. At the same time, your caregiver will also perform a test on the secretions of the vaginal tissue. This test is to determine if a type of bacteria, Group B streptococcus, is present. Your caregiver will explain this further. Your caregiver may ask you:  What your birth plan is.  How you are feeling.  If you are feeling the baby move.  If you have had any abnormal symptoms, such as leaking fluid, bleeding, severe headaches, or abdominal cramping.  If you have any questions. Other tests or screenings that may be performed during your third trimester include:  Blood tests that check for low iron levels (anemia).  Fetal testing to check the health, activity level, and growth of the fetus. Testing is done if you have certain medical conditions or if there are problems during the pregnancy. FALSE LABOR You may feel small, irregular contractions that eventually go away. These are called Braxton Hicks contractions, or false labor. Contractions may last for hours, days, or even weeks before true labor sets in. If contractions come at regular intervals, intensify, or become painful, it is best to be seen by your caregiver.    SIGNS OF LABOR   Menstrual-like cramps.  Contractions that are 5 minutes apart or less.  Contractions that start on the top of the uterus and spread down to the lower abdomen and back.  A sense of increased pelvic pressure or back pain.  A watery or bloody mucus discharge that comes from the vagina. If you have any of these signs before the 37th week of pregnancy, call your caregiver right away. You need to go to the hospital to get checked immediately. HOME CARE INSTRUCTIONS   Avoid all  smoking, herbs, alcohol, and unprescribed drugs. These chemicals affect the formation and growth of the baby.  Follow your caregiver's instructions regarding medicine use. There are medicines that are either safe or unsafe to take during pregnancy.  Exercise only as directed by your caregiver. Experiencing uterine cramps is a good sign to stop exercising.  Continue to eat regular, healthy meals.  Wear a good support bra for breast tenderness.  Do not use hot tubs, steam rooms, or saunas.  Wear your seat belt at all times when driving.  Avoid raw meat, uncooked cheese, cat litter boxes, and soil used by cats. These carry germs that can cause birth defects in the baby.  Take your prenatal vitamins.  Try taking a stool softener (if your caregiver approves) if you develop constipation. Eat more high-fiber foods, such as fresh vegetables or fruit and whole grains. Drink plenty of fluids to keep your urine clear or pale yellow.  Take warm sitz baths to soothe any pain or discomfort caused by hemorrhoids. Use hemorrhoid cream if your caregiver approves.  If you develop varicose veins, wear support hose. Elevate your feet for 15 minutes, 3 4 times a day. Limit salt in your diet.  Avoid heavy lifting, wear low heal shoes, and practice good posture.  Rest a lot with your legs elevated if you have leg cramps or low back pain.  Visit your dentist if you have not gone during your pregnancy. Use a soft toothbrush to brush your teeth and be gentle when you floss.  A sexual relationship may be continued unless your caregiver directs you otherwise.  Do not travel far distances unless it is absolutely necessary and only with the approval of your caregiver.  Take prenatal classes to understand, practice, and ask questions about the labor and delivery.  Make a trial run to the hospital.  Pack your hospital bag.  Prepare the baby's nursery.  Continue to go to all your prenatal visits as directed  by your caregiver. SEEK MEDICAL CARE IF:  You are unsure if you are in labor or if your water has broken.  You have dizziness.  You have mild pelvic cramps, pelvic pressure, or nagging pain in your abdominal area.  You have persistent nausea, vomiting, or diarrhea.  You have a bad smelling vaginal discharge.  You have pain with urination. SEEK IMMEDIATE MEDICAL CARE IF:   You have a fever.  You are leaking fluid from your vagina.  You have spotting or bleeding from your vagina.  You have severe abdominal cramping or pain.  You have rapid weight loss or gain.  You have shortness of breath with chest pain.  You notice sudden or extreme swelling of your face, hands, ankles, feet, or legs.  You have not felt your baby move in over an hour.  You have severe headaches that do not go away with medicine.  You have vision changes. Document Released: 09/03/2001 Document Revised: 05/12/2013 Document Reviewed:   11/10/2012 [employer] Patient Information 2014 [employer].  Breastfeeding Deciding to breastfeed is one of the best choices you can make for you and your baby. A change in hormones during pregnancy causes your breast tissue to grow and increases the number and size of your milk ducts. These hormones also allow proteins, sugars, and fats from your blood supply to make breast milk in your milk-producing glands. Hormones prevent breast milk from being released before your baby is born as well as prompt milk flow after birth. Once breastfeeding has begun, thoughts of your baby, as well as his or her sucking or crying, can stimulate the release of milk from your milk-producing glands.  BENEFITS OF BREASTFEEDING For Your Baby  Your first milk (colostrum) helps your baby's digestive system function better.   There are antibodies in your milk that help your baby fight off infections.   Your baby has a lower incidence of asthma, allergies, and sudden infant death syndrome.    The nutrients in breast milk are better for your baby than infant formulas and are designed uniquely for your baby's needs.   Breast milk improves your baby's brain development.   Your baby is less likely to develop other conditions, such as childhood obesity, asthma, or type 2 diabetes mellitus.  For You   Breastfeeding helps to create a very special bond between you and your baby.   Breastfeeding is convenient. Breast milk is always available at the correct temperature and costs nothing.   Breastfeeding helps to burn calories and helps you lose the weight gained during pregnancy.   Breastfeeding makes your uterus contract to its prepregnancy size faster and slows bleeding (lochia) after you give birth.   Breastfeeding helps to lower your risk of developing type 2 diabetes mellitus, osteoporosis, and breast or ovarian cancer later in life. SIGNS THAT YOUR BABY IS HUNGRY Early Signs of Hunger  Increased alertness or activity.  Stretching.  Movement of the head from side to side.  Movement of the head and opening of the mouth when the corner of the mouth or cheek is stroked (rooting).  Increased sucking sounds, smacking lips, cooing, sighing, or squeaking.  Hand-to-mouth movements.  Increased sucking of fingers or hands. Late Signs of Hunger  Fussing.  Intermittent crying. Extreme Signs of Hunger Signs of extreme hunger will require calming and consoling before your baby will be able to breastfeed successfully. Do not wait for the following signs of extreme hunger to occur before you initiate breastfeeding:   Restlessness.  A loud, strong cry.   Screaming. BREASTFEEDING BASICS Breastfeeding Initiation  Find a comfortable place to sit or lie down, with your neck and back well supported.  Place a pillow or rolled up blanket under your baby to bring him or her to the level of your breast (if you are seated). Nursing pillows are specially designed to help  support your arms and your baby while you breastfeed.  Make sure that your baby's abdomen is facing your abdomen.   Gently massage your breast. With your fingertips, massage from your chest wall toward your nipple in a circular motion. This encourages milk flow. You may need to continue this action during the feeding if your milk flows slowly.  Support your breast with 4 fingers underneath and your thumb above your nipple. Make sure your fingers are well away from your nipple and your baby's mouth.   Stroke your baby's lips gently with your finger or nipple.   When your baby's mouth is   open wide enough, quickly bring your baby to your breast, placing your entire nipple and as much of the colored area around your nipple (areola) as possible into your baby's mouth.   More areola should be visible above your baby's upper lip than below the lower lip.   Your baby's tongue should be between his or her lower gum and your breast.   Ensure that your baby's mouth is correctly positioned around your nipple (latched). Your baby's lips should create a seal on your breast and be turned out (everted).  It is common for your baby to suck about 2 3 minutes in order to start the flow of breast milk. Latching Teaching your baby how to latch on to your breast properly is very important. An improper latch can cause nipple pain and decreased milk supply for you and poor weight gain in your baby. Also, if your baby is not latched onto your nipple properly, he or she may swallow some air during feeding. This can make your baby fussy. Burping your baby when you switch breasts during the feeding can help to get rid of the air. However, teaching your baby to latch on properly is still the best way to prevent fussiness from swallowing air while breastfeeding. Signs that your baby has successfully latched on to your nipple:    Silent tugging or silent sucking, without causing you pain.   Swallowing heard  between every 3 4 sucks.    Muscle movement above and in front of his or her ears while sucking.  Signs that your baby has not successfully latched on to nipple:   Sucking sounds or smacking sounds from your baby while breastfeeding.  Nipple pain. If you think your baby has not latched on correctly, slip your finger into the corner of your baby's mouth to break the suction and place it between your baby's gums. Attempt breastfeeding initiation again. Signs of Successful Breastfeeding Signs from your baby:   A gradual decrease in the number of sucks or complete cessation of sucking.   Falling asleep.   Relaxation of his or her body.   Retention of a small amount of milk in his or her mouth.   Letting go of your breast by himself or herself. Signs from you:  Breasts that have increased in firmness, weight, and size 1 3 hours after feeding.   Breasts that are softer immediately after breastfeeding.  Increased milk volume, as well as a change in milk consistency and color by the 5th day of breastfeeding.   Nipples that are not sore, cracked, or bleeding. Signs That Your Baby is Getting Enough Milk  Wetting at least 3 diapers in a 24-hour period. The urine should be clear and pale yellow by age 5 days.  At least 3 stools in a 24-hour period by age 5 days. The stool should be soft and yellow.  At least 3 stools in a 24-hour period by age 7 days. The stool should be seedy and yellow.  No loss of weight greater than 10% of birth weight during the first 3 days of age.  Average weight gain of 4 7 ounces (120 210 mL) per week after age 4 days.  Consistent daily weight gain by age 5 days, without weight loss after the age of 2 weeks. After a feeding, your baby may spit up a small amount. This is common. BREASTFEEDING FREQUENCY AND DURATION Frequent feeding will help you make more milk and can prevent sore nipples and breast engorgement.   Breastfeed when you feel the need to  reduce the fullness of your breasts or when your baby shows signs of hunger. This is called "breastfeeding on demand." Avoid introducing a pacifier to your baby while you are working to establish breastfeeding (the first 4 6 weeks after your baby is born). After this time you may choose to use a pacifier. Research has shown that pacifier use during the first year of a baby's life decreases the risk of sudden infant death syndrome (SIDS). Allow your baby to feed on each breast as long as he or she wants. Breastfeed until your baby is finished feeding. When your baby unlatches or falls asleep while feeding from the first breast, offer the second breast. Because newborns are often sleepy in the first few weeks of life, you may need to awaken your baby to get him or her to feed. Breastfeeding times will vary from baby to baby. However, the following rules can serve as a guide to help you ensure that your baby is properly fed:  Newborns (babies 4 weeks of age or younger) may breastfeed every 1 3 hours.  Newborns should not go longer than 3 hours during the day or 5 hours during the night without breastfeeding.  You should breastfeed your baby a minimum of 8 times in a 24-hour period until you begin to introduce solid foods to your baby at around 6 months of age. BREAST MILK PUMPING Pumping and storing breast milk allows you to ensure that your baby is exclusively fed your breast milk, even at times when you are unable to breastfeed. This is especially important if you are going back to work while you are still breastfeeding or when you are not able to be present during feedings. Your lactation consultant can give you guidelines on how long it is safe to store breast milk.  A breast pump is a machine that allows you to pump milk from your breast into a sterile bottle. The pumped breast milk can then be stored in a refrigerator or freezer. Some breast pumps are operated by hand, while others use electricity. Ask  your lactation consultant which type will work best for you. Breast pumps can be purchased, but some hospitals and breastfeeding support groups lease breast pumps on a monthly basis. A lactation consultant can teach you how to hand express breast milk, if you prefer not to use a pump.  CARING FOR YOUR BREASTS WHILE YOU BREASTFEED Nipples can become dry, cracked, and sore while breastfeeding. The following recommendations can help keep your breasts moisturized and healthy:  Avoid using soap on your nipples.   Wear a supportive bra. Although not required, special nursing bras and tank tops are designed to allow access to your breasts for breastfeeding without taking off your entire bra or top. Avoid wearing underwire style bras or extremely tight bras.  Air dry your nipples for 3 4minutes after each feeding.   Use only cotton bra pads to absorb leaked breast milk. Leaking of breast milk between feedings is normal.   Use lanolin on your nipples after breastfeeding. Lanolin helps to maintain your skin's normal moisture barrier. If you use pure lanolin you do not need to wash it off before feeding your baby again. Pure lanolin is not toxic to your baby. You may also hand express a few drops of breast milk and gently massage that milk into your nipples and allow the milk to air dry. In the first few weeks after giving birth, some women   experience extremely full breasts (engorgement). Engorgement can make your breasts feel heavy, warm, and tender to the touch. Engorgement peaks within 3 5 days after you give birth. The following recommendations can help ease engorgement:  Completely empty your breasts while breastfeeding or pumping. You may want to start by applying warm, moist heat (in the shower or with warm water-soaked hand towels) just before feeding or pumping. This increases circulation and helps the milk flow. If your baby does not completely empty your breasts while breastfeeding, pump any extra  milk after he or she is finished.  Wear a snug bra (nursing or regular) or tank top for 1 2 days to signal your body to slightly decrease milk production.  Apply ice packs to your breasts, unless this is too uncomfortable for you.  Make sure that your baby is latched on and positioned properly while breastfeeding. If engorgement persists after 48 hours of following these recommendations, contact your health care provider or a lactation consultant. OVERALL HEALTH CARE RECOMMENDATIONS WHILE BREASTFEEDING  Eat healthy foods. Alternate between meals and snacks, eating 3 of each per day. Because what you eat affects your breast milk, some of the foods may make your baby more irritable than usual. Avoid eating these foods if you are sure that they are negatively affecting your baby.  Drink milk, fruit juice, and water to satisfy your thirst (about 10 glasses a day).   Rest often, relax, and continue to take your prenatal vitamins to prevent fatigue, stress, and anemia.  Continue breast self-awareness checks.  Avoid chewing and smoking tobacco.  Avoid alcohol and drug use. Some medicines that may be harmful to your baby can pass through breast milk. It is important to ask your health care provider before taking any medicine, including all over-the-counter and prescription medicine as well as vitamin and herbal supplements. It is possible to become pregnant while breastfeeding. If birth control is desired, ask your health care provider about options that will be safe for your baby. SEEK MEDICAL CARE IF:   You feel like you want to stop breastfeeding or have become frustrated with breastfeeding.  You have painful breasts or nipples.  Your nipples are cracked or bleeding.  Your breasts are red, tender, or warm.  You have a swollen area on either breast.  You have a fever or chills.  You have nausea or vomiting.  You have drainage other than breast milk from your nipples.  Your breasts  do not become full before feedings by the 5th day after you give birth.  You feel sad and depressed.  Your baby is too sleepy to eat well.  Your baby is having trouble sleeping.   Your baby is wetting less than 3 diapers in a 24-hour period.  Your baby has less than 3 stools in a 24-hour period.  Your baby's skin or the white part of his or her eyes becomes yellow.   Your baby is not gaining weight by 5 days of age. SEEK IMMEDIATE MEDICAL CARE IF:   Your baby is overly tired (lethargic) and does not want to wake up and feed.  Your baby develops an unexplained fever. Document Released: 09/09/2005 Document Revised: 05/12/2013 Document Reviewed: 03/03/2013 [employer] Patient Information 2014 [employer].  

## 2013-11-09 NOTE — Progress Notes (Signed)
Doing well--remains vertex.

## 2013-11-15 ENCOUNTER — Ambulatory Visit (INDEPENDENT_AMBULATORY_CARE_PROVIDER_SITE_OTHER): Payer: Medicaid Other | Admitting: Obstetrics & Gynecology

## 2013-11-15 ENCOUNTER — Encounter: Payer: Self-pay | Admitting: Obstetrics & Gynecology

## 2013-11-15 VITALS — BP 129/83 | Wt 181.0 lb

## 2013-11-15 DIAGNOSIS — Z3400 Encounter for supervision of normal first pregnancy, unspecified trimester: Secondary | ICD-10-CM

## 2013-11-15 NOTE — Progress Notes (Signed)
Routine visit. Good FM. Denies VB, ROM, or CTXs. She has had a headache for 3 days with visual changes, but she says that this feels like her usual migraines. She does complain of pedal swelling. DTRs 2+ BL and equal. Start 24 hour urine, labs, recommend modified bedrest. Pre eclampsia precautions. She says that her SBP at [facility] this weekend was 145.

## 2013-11-15 NOTE — Progress Notes (Signed)
P-83.  Pt has had a constant headache x 3 days.  Pt has been taking Tylenol and it is helping some. She is not taking the Tylenol much though.

## 2013-11-16 ENCOUNTER — Encounter: Payer: Medicaid Other | Admitting: Obstetrics & Gynecology

## 2013-11-17 ENCOUNTER — Other Ambulatory Visit (INDEPENDENT_AMBULATORY_CARE_PROVIDER_SITE_OTHER): Payer: Medicaid Other | Admitting: *Deleted

## 2013-11-17 DIAGNOSIS — R51 Headache: Secondary | ICD-10-CM

## 2013-11-17 DIAGNOSIS — O9989 Other specified diseases and conditions complicating pregnancy, childbirth and the puerperium: Secondary | ICD-10-CM

## 2013-11-17 DIAGNOSIS — R519 Headache, unspecified: Secondary | ICD-10-CM

## 2013-11-17 DIAGNOSIS — O99891 Other specified diseases and conditions complicating pregnancy: Secondary | ICD-10-CM

## 2013-11-17 NOTE — Progress Notes (Signed)
Pt here today to drop off a 24-hr urine and to have labs drawn.  I have drawn a CMP and CBC today.

## 2013-11-18 ENCOUNTER — Encounter: Payer: Medicaid Other | Admitting: Family Medicine

## 2013-11-18 LAB — CBC WITH DIFFERENTIAL/PLATELET
BASOS PCT: 0 % (ref 0–1)
Basophils Absolute: 0 10*3/uL (ref 0.0–0.1)
EOS ABS: 0.1 10*3/uL (ref 0.0–0.7)
Eosinophils Relative: 1 % (ref 0–5)
HEMATOCRIT: 35.5 % — AB (ref 36.0–46.0)
Hemoglobin: 12.1 g/dL (ref 12.0–15.0)
Lymphocytes Relative: 23 % (ref 12–46)
Lymphs Abs: 2 10*3/uL (ref 0.7–4.0)
MCH: 31.1 pg (ref 26.0–34.0)
MCHC: 34.1 g/dL (ref 30.0–36.0)
MCV: 91.3 fL (ref 78.0–100.0)
MONO ABS: 0.6 10*3/uL (ref 0.1–1.0)
Monocytes Relative: 7 % (ref 3–12)
Neutro Abs: 6 10*3/uL (ref 1.7–7.7)
Neutrophils Relative %: 69 % (ref 43–77)
Platelets: 243 10*3/uL (ref 150–400)
RBC: 3.89 MIL/uL (ref 3.87–5.11)
RDW: 13.9 % (ref 11.5–15.5)
WBC: 8.7 10*3/uL (ref 4.0–10.5)

## 2013-11-18 LAB — COMPREHENSIVE METABOLIC PANEL
ALBUMIN: 3.4 g/dL — AB (ref 3.5–5.2)
ALK PHOS: 191 U/L — AB (ref 39–117)
ALT: 12 U/L (ref 0–35)
AST: 20 U/L (ref 0–37)
BUN: 12 mg/dL (ref 6–23)
CO2: 24 mEq/L (ref 19–32)
CREATININE: 0.56 mg/dL (ref 0.50–1.10)
Calcium: 9.5 mg/dL (ref 8.4–10.5)
Chloride: 100 mEq/L (ref 96–112)
GLUCOSE: 89 mg/dL (ref 70–99)
POTASSIUM: 4.2 meq/L (ref 3.5–5.3)
Sodium: 137 mEq/L (ref 135–145)
Total Bilirubin: 0.4 mg/dL (ref 0.2–1.2)
Total Protein: 6 g/dL (ref 6.0–8.3)

## 2013-11-19 ENCOUNTER — Ambulatory Visit (INDEPENDENT_AMBULATORY_CARE_PROVIDER_SITE_OTHER): Payer: Medicaid Other | Admitting: Nurse Practitioner

## 2013-11-19 VITALS — BP 113/76 | Wt 183.4 lb

## 2013-11-19 DIAGNOSIS — O368390 Maternal care for abnormalities of the fetal heart rate or rhythm, unspecified trimester, not applicable or unspecified: Secondary | ICD-10-CM

## 2013-11-19 DIAGNOSIS — Z3400 Encounter for supervision of normal first pregnancy, unspecified trimester: Secondary | ICD-10-CM
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
## 2013-11-19 NOTE — Progress Notes (Signed)
P- 76.  Having headaches x 1 week.

## 2013-11-20 LAB — PROTEIN, URINE, 24 HOUR
Protein, 24H Urine: 112 mg/d — ABNORMAL HIGH (ref 50–100)
Protein, Urine: 8 mg/dL

## 2013-11-22 ENCOUNTER — Encounter: Payer: Self-pay | Admitting: Obstetrics and Gynecology

## 2013-11-22 ENCOUNTER — Ambulatory Visit (INDEPENDENT_AMBULATORY_CARE_PROVIDER_SITE_OTHER): Payer: Medicaid Other | Admitting: Obstetrics and Gynecology

## 2013-11-22 ENCOUNTER — Other Ambulatory Visit: Payer: Self-pay | Admitting: Obstetrics and Gynecology

## 2013-11-22 VITALS — BP 120/88 | Wt 182.0 lb

## 2013-11-22 DIAGNOSIS — R51 Headache: Secondary | ICD-10-CM

## 2013-11-22 DIAGNOSIS — O2690 Pregnancy related conditions, unspecified, unspecified trimester: Secondary | ICD-10-CM

## 2013-11-22 DIAGNOSIS — O26899 Other specified pregnancy related conditions, unspecified trimester: Secondary | ICD-10-CM

## 2013-11-22 DIAGNOSIS — Z3400 Encounter for supervision of normal first pregnancy, unspecified trimester: Secondary | ICD-10-CM

## 2013-11-22 DIAGNOSIS — R519 Headache, unspecified: Secondary | ICD-10-CM

## 2013-11-22 DIAGNOSIS — O9989 Other specified diseases and conditions complicating pregnancy, childbirth and the puerperium: Secondary | ICD-10-CM

## 2013-11-22 NOTE — Progress Notes (Signed)
Patient is doing well, frustrated that labor has not happened yet. Patient was hoping for IOL as her mother is in town until 3/16. Discussed increased risk of c-section with IOL and unfavorable cervix. Patient verbalized understanding. Will schedule IOL on 3/10 and postdate fetal testing this week. FM/labor precautions reviewed

## 2013-11-22 NOTE — Progress Notes (Signed)
P-79.  Pt had some brown discharge over the weekend.

## 2013-11-23 ENCOUNTER — Telehealth (HOSPITAL_COMMUNITY): Payer: Self-pay | Admitting: *Deleted

## 2013-11-23 NOTE — Telephone Encounter (Signed)
Preadmission screen  

## 2013-11-25 ENCOUNTER — Ambulatory Visit (HOSPITAL_COMMUNITY)
Admission: RE | Admit: 2013-11-25 | Discharge: 2013-11-25 | Disposition: A | Discharge status: Home / Self Care | Payer: Medicaid Other | Source: Ambulatory Visit | Attending: Obstetrics and Gynecology | Admitting: Obstetrics and Gynecology

## 2013-11-25 ENCOUNTER — Inpatient Hospital Stay (HOSPITAL_COMMUNITY)
Admission: AD | Admit: 2013-11-25 | Discharge: 2013-11-28 | DRG: 766 | Disposition: A | Discharge status: Home / Self Care | Payer: Medicaid Other | Source: Ambulatory Visit | Attending: Obstetrics & Gynecology | Admitting: Obstetrics & Gynecology

## 2013-11-25 ENCOUNTER — Encounter (HOSPITAL_COMMUNITY): Payer: Self-pay

## 2013-11-25 DIAGNOSIS — O403XX0 Polyhydramnios, third trimester, not applicable or unspecified: Secondary | ICD-10-CM | POA: Diagnosis present

## 2013-11-25 DIAGNOSIS — E669 Obesity, unspecified: Secondary | ICD-10-CM | POA: Diagnosis present

## 2013-11-25 DIAGNOSIS — O26899 Other specified pregnancy related conditions, unspecified trimester: Secondary | ICD-10-CM

## 2013-11-25 DIAGNOSIS — K589 Irritable bowel syndrome without diarrhea: Secondary | ICD-10-CM | POA: Diagnosis present

## 2013-11-25 DIAGNOSIS — Z3400 Encounter for supervision of normal first pregnancy, unspecified trimester: Secondary | ICD-10-CM

## 2013-11-25 DIAGNOSIS — O409XX0 Polyhydramnios, unspecified trimester, not applicable or unspecified: Secondary | ICD-10-CM

## 2013-11-25 DIAGNOSIS — Z9889 Other specified postprocedural states: Secondary | ICD-10-CM

## 2013-11-25 DIAGNOSIS — D649 Anemia, unspecified: Secondary | ICD-10-CM | POA: Diagnosis present

## 2013-11-25 DIAGNOSIS — O9902 Anemia complicating childbirth: Secondary | ICD-10-CM | POA: Diagnosis present

## 2013-11-25 DIAGNOSIS — K219 Gastro-esophageal reflux disease without esophagitis: Secondary | ICD-10-CM | POA: Diagnosis present

## 2013-11-25 DIAGNOSIS — R51 Headache: Secondary | ICD-10-CM

## 2013-11-25 DIAGNOSIS — O2690 Pregnancy related conditions, unspecified, unspecified trimester: Secondary | ICD-10-CM

## 2013-11-25 DIAGNOSIS — O99214 Obesity complicating childbirth: Secondary | ICD-10-CM

## 2013-11-25 HISTORY — DX: Polyhydramnios, third trimester, not applicable or unspecified: O40.3XX0

## 2013-11-25 LAB — COMPREHENSIVE METABOLIC PANEL
ALK PHOS: 207 U/L — AB (ref 39–117)
ALT: 11 U/L (ref 0–35)
AST: 19 U/L (ref 0–37)
Albumin: 2.7 g/dL — ABNORMAL LOW (ref 3.5–5.2)
BUN: 12 mg/dL (ref 6–23)
CALCIUM: 9.2 mg/dL (ref 8.4–10.5)
CO2: 19 meq/L (ref 19–32)
Chloride: 104 mEq/L (ref 96–112)
Creatinine, Ser: 0.7 mg/dL (ref 0.50–1.10)
GLUCOSE: 71 mg/dL (ref 70–99)
Potassium: 4.3 mEq/L (ref 3.7–5.3)
Sodium: 137 mEq/L (ref 137–147)
Total Bilirubin: 0.2 mg/dL — ABNORMAL LOW (ref 0.3–1.2)
Total Protein: 6.4 g/dL (ref 6.0–8.3)

## 2013-11-25 LAB — PROTEIN / CREATININE RATIO, URINE
Creatinine, Urine: 62.62 mg/dL
Protein Creatinine Ratio: 0.06 (ref 0.00–0.15)
Total Protein, Urine: 4 mg/dL

## 2013-11-25 LAB — CBC
HEMATOCRIT: 35.1 % — AB (ref 36.0–46.0)
HEMOGLOBIN: 11.9 g/dL — AB (ref 12.0–15.0)
MCH: 31.2 pg (ref 26.0–34.0)
MCHC: 33.9 g/dL (ref 30.0–36.0)
MCV: 91.9 fL (ref 78.0–100.0)
Platelets: 244 10*3/uL (ref 150–400)
RBC: 3.82 MIL/uL — ABNORMAL LOW (ref 3.87–5.11)
RDW: 13.5 % (ref 11.5–15.5)
WBC: 9.7 10*3/uL (ref 4.0–10.5)

## 2013-11-25 LAB — RPR: RPR Ser Ql: NONREACTIVE

## 2013-11-25 LAB — ABO/RH: ABO/RH(D): A POS

## 2013-11-25 MED ORDER — TERBUTALINE SULFATE 1 MG/ML IJ SOLN: 0.2500 mg | Freq: Once | INTRAMUSCULAR | Status: AC | PRN

## 2013-11-25 MED ORDER — ZOLPIDEM TARTRATE 5 MG PO TABS
5.0000 mg | ORAL_TABLET | Freq: Every evening | ORAL | Status: DC | PRN
  Administered 2013-11-25: 5 mg via ORAL
  Filled 2013-11-25: qty 1

## 2013-11-25 MED ORDER — OXYTOCIN BOLUS FROM INFUSION: 500.0000 mL | INTRAVENOUS | Status: DC

## 2013-11-25 MED ORDER — MISOPROSTOL 25 MCG QUARTER TABLET
25.0000 ug | ORAL_TABLET | ORAL | Status: DC | PRN
  Administered 2013-11-25 – 2013-11-26 (×2): 25 ug via VAGINAL
  Filled 2013-11-25 (×2): qty 0.25

## 2013-11-25 MED ORDER — LIDOCAINE HCL (PF) 1 % IJ SOLN: 30.0000 mL | INTRAMUSCULAR | Status: DC | PRN

## 2013-11-25 MED ORDER — ONDANSETRON HCL 4 MG/2ML IJ SOLN
4.0000 mg | Freq: Four times a day (QID) | INTRAMUSCULAR | Status: DC | PRN
  Administered 2013-11-26 (×3): 4 mg via INTRAVENOUS
  Filled 2013-11-25 (×3): qty 2

## 2013-11-25 MED ORDER — OXYCODONE-ACETAMINOPHEN 5-325 MG PO TABS: 1.0000 | ORAL_TABLET | ORAL | Status: DC | PRN

## 2013-11-25 MED ORDER — LACTATED RINGERS IV SOLN: 500.0000 mL | INTRAVENOUS | Status: DC | PRN

## 2013-11-25 MED ORDER — OXYTOCIN 40 UNITS IN LACTATED RINGERS INFUSION - SIMPLE MED: 62.5000 mL/h | INTRAVENOUS | Status: DC

## 2013-11-25 MED ORDER — IBUPROFEN 600 MG PO TABS: 600.0000 mg | ORAL_TABLET | Freq: Four times a day (QID) | ORAL | Status: DC | PRN

## 2013-11-25 MED ORDER — LACTATED RINGERS IV SOLN
INTRAVENOUS | Status: DC
Start: 2013-11-25 — End: 2013-11-27
  Administered 2013-11-26 (×7): via INTRAVENOUS

## 2013-11-25 MED ORDER — ACETAMINOPHEN 325 MG PO TABS
650.0000 mg | ORAL_TABLET | ORAL | Status: DC | PRN
  Administered 2013-11-26: 650 mg via ORAL
  Filled 2013-11-25: qty 2

## 2013-11-25 MED ORDER — CITRIC ACID-SODIUM CITRATE 334-500 MG/5ML PO SOLN
30.0000 mL | ORAL | Status: DC | PRN
  Administered 2013-11-26: 30 mL via ORAL
  Filled 2013-11-25: qty 15

## 2013-11-25 NOTE — H&P (Signed)
Rhonda Graves is a 33 y.o. female G1 at [redacted]w[redacted]d presenting for IOL per recommendation of MFM indicated by polyhydramnios with AFI today 31. Plans waterbirth. No blood products.  PNC at [facility] significant for breech with successful version; borderline BP elevations and H/As with normal PreE labs, 24 hr pro 112 on 11/17/13.   Maternal Medical History:  Reason for admission: Nausea.     OB History   Grav Para Term Preterm Abortions TAB SAB Ect Mult Living   1         0     Past Medical History  Diagnosis Date  . Headache(784.0)     migraine  . IBS (irritable bowel syndrome)   . Multiple allergies   . GERD (gastroesophageal reflux disease)   . Abnormal Pap smear     had colpo in past, normal pap since then   Past Surgical History  Procedure Laterality Date  . No past surgeries     Family History: family history includes Arthritis in her mother; Asthma in her brother; Hypothyroidism in her mother. Social History:  reports that she has never smoked. She has never used smokeless tobacco. She reports that she drinks alcohol. She reports that she does not use illicit drugs.   Prenatal Transfer Tool  Maternal Diabetes: No Genetic Screening: Normal Maternal Ultrasounds/Referrals: Abnormal:  Findings:   Other: polyhydramnios, EIF Fetal Ultrasounds or other Referrals:  None Maternal Substance Abuse:  No Significant Maternal Medications:  None Significant Maternal Lab Results:  Lab values include: Group B Strep negative Other Comments:  None  Review of Systems  Constitutional: Negative for fever.  Eyes: Negative for blurred vision, double vision and photophobia.  Respiratory: Negative for cough.   Cardiovascular: Positive for leg swelling.  Gastrointestinal: Positive for nausea. Negative for heartburn, vomiting, diarrhea and constipation.  Genitourinary: Negative for dysuria.  Skin: Negative for rash.  Neurological: Negative for dizziness and headaches.  Psychiatric/Behavioral: Negative  for depression and suicidal ideas. The patient is not nervous/anxious.     Dilation: 1 Blood pressure 142/87, pulse 76, temperature 98.1 F (36.7 C), temperature source Oral, resp. rate 18, height 5\' 1"  (1.549 m), weight 84.369 kg (186 lb), last menstrual period 02/16/2013. Maternal Exam:  Uterine Assessment: Contraction strength is mild.  Contraction duration is 40 seconds. Contraction frequency is irregular.   Abdomen: Patient reports no abdominal tenderness. Estimated fetal weight is EFW 7-8#.   Fetal presentation: vertex  Introitus: Normal vulva. Normal vagina.  Ferning test: not done.  Nitrazine test: not done. Amniotic fluid character: not assessed.  Pelvis: adequate for delivery.   Cervix: Cervix evaluated by digital exam.     Fetal Exam Fetal Monitor Review: Mode: ultrasound.   Baseline rate: 120.  Variability: moderate (6-25 bpm).   Pattern: accelerations present and no decelerations.    Fetal State Assessment: Category I - tracings are normal.     Physical Exam  Constitutional: She is oriented to person, place, and time. She appears well-developed and well-nourished. No distress.  HENT:  Head: Normocephalic.  Eyes: Pupils are equal, round, and reactive to light.  Neck: Normal range of motion. Neck supple. No thyromegaly present.  Cardiovascular: Normal rate, regular rhythm and normal heart sounds.   Respiratory: Effort normal and breath sounds normal. No respiratory distress.  GI: Soft.  Genitourinary: Vagina normal and uterus normal.  Musculoskeletal: Normal range of motion. She exhibits edema.  Neurological: She is alert and oriented to person, place, and time. She has normal reflexes.  Skin: Skin  is warm and dry.  Psychiatric: She has a normal mood and affect. Her behavior is normal. Judgment and thought content normal.   Dilation: 1 Station: -3 Exam by:: Margie Brink. CNM cx int FT to closed/ thick/ high  Prenatal labs: ABO, Rh: A/POS/-- (07/17 0932) Antibody:  NEG (07/17 0932) Rubella: 4.20 (07/17 0932) RPR: NON REAC (12/09 1103)  HBsAg: NEGATIVE (07/17 0932)  HIV: NON REACTIVE (12/09 1103)  GBS: Negative (02/03 0000)  1 hr glucola 88  Assessment/Plan: 33 yo G1 at [redacted]w[redacted]d Polyhydramnios Category 1 FHR Unfavorable cx> cytotec   Hank Walling 11/25/2013, 6:27 PM

## 2013-11-25 NOTE — Progress Notes (Signed)
Orders for RN to place cytotec after patient eats a light dinner.

## 2013-11-26 ENCOUNTER — Ambulatory Visit (HOSPITAL_COMMUNITY): Payer: Medicaid Other

## 2013-11-26 ENCOUNTER — Encounter (HOSPITAL_COMMUNITY): Payer: Self-pay | Admitting: *Deleted

## 2013-11-26 ENCOUNTER — Inpatient Hospital Stay (HOSPITAL_COMMUNITY): Payer: Medicaid Other | Admitting: Anesthesiology

## 2013-11-26 ENCOUNTER — Encounter (HOSPITAL_COMMUNITY)
Admission: AD | Disposition: A | Discharge status: Home / Self Care | Payer: Self-pay | Source: Ambulatory Visit | Attending: Obstetrics & Gynecology

## 2013-11-26 ENCOUNTER — Other Ambulatory Visit (HOSPITAL_COMMUNITY): Payer: Medicaid Other

## 2013-11-26 ENCOUNTER — Encounter (HOSPITAL_COMMUNITY): Payer: Medicaid Other | Admitting: Anesthesiology

## 2013-11-26 DIAGNOSIS — O409XX0 Polyhydramnios, unspecified trimester, not applicable or unspecified: Secondary | ICD-10-CM

## 2013-11-26 LAB — TYPE AND SCREEN
ABO/RH(D): A POS
Antibody Screen: NEGATIVE

## 2013-11-26 LAB — NO BLOOD PRODUCTS

## 2013-11-26 SURGERY — Surgical Case
Anesthesia: Epidural | Site: Abdomen

## 2013-11-26 MED ORDER — MORPHINE SULFATE (PF) 0.5 MG/ML IJ SOLN
INTRAMUSCULAR | Status: DC | PRN
  Administered 2013-11-26: 3 mg via EPIDURAL

## 2013-11-26 MED ORDER — FENTANYL 2.5 MCG/ML BUPIVACAINE 1/10 % EPIDURAL INFUSION (WH - ANES)
INTRAMUSCULAR | Status: AC
  Filled 2013-11-26: qty 125

## 2013-11-26 MED ORDER — PHENYLEPHRINE 40 MCG/ML (10ML) SYRINGE FOR IV PUSH (FOR BLOOD PRESSURE SUPPORT)
PREFILLED_SYRINGE | INTRAVENOUS | Status: AC
  Filled 2013-11-26: qty 10

## 2013-11-26 MED ORDER — LORATADINE 10 MG PO TABS
10.0000 mg | ORAL_TABLET | Freq: Every day | ORAL | Status: DC
  Administered 2013-11-26 – 2013-11-27 (×2): 10 mg via ORAL
  Filled 2013-11-26 (×4): qty 1

## 2013-11-26 MED ORDER — DEXTROSE 5 % IV SOLN
2.0000 g | INTRAVENOUS | Status: AC
  Administered 2013-11-26: 2 g via INTRAVENOUS
  Filled 2013-11-26: qty 2

## 2013-11-26 MED ORDER — OXYTOCIN 10 UNIT/ML IJ SOLN
40.0000 [IU] | INTRAVENOUS | Status: DC | PRN
  Administered 2013-11-26 (×2): 40 [IU] via INTRAVENOUS

## 2013-11-26 MED ORDER — SODIUM BICARBONATE 8.4 % IV SOLN
INTRAVENOUS | Status: DC | PRN
  Administered 2013-11-26 (×4): 5 mL via EPIDURAL

## 2013-11-26 MED ORDER — PHENYLEPHRINE HCL 10 MG/ML IJ SOLN
INTRAMUSCULAR | Status: DC | PRN
  Administered 2013-11-26: 40 ug via INTRAVENOUS

## 2013-11-26 MED ORDER — ONDANSETRON HCL 4 MG/2ML IJ SOLN
INTRAMUSCULAR | Status: DC | PRN
  Administered 2013-11-26: 4 mg via INTRAVENOUS

## 2013-11-26 MED ORDER — LORATADINE 10 MG PO TABS
10.0000 mg | ORAL_TABLET | Freq: Every day | ORAL | Status: DC
  Filled 2013-11-26 (×2): qty 1

## 2013-11-26 MED ORDER — EPHEDRINE 5 MG/ML INJ: 10.0000 mg | INTRAVENOUS | Status: DC | PRN

## 2013-11-26 MED ORDER — EPHEDRINE 5 MG/ML INJ
INTRAVENOUS | Status: AC
  Filled 2013-11-26: qty 4

## 2013-11-26 MED ORDER — MORPHINE SULFATE 0.5 MG/ML IJ SOLN
INTRAMUSCULAR | Status: AC
  Filled 2013-11-26: qty 10

## 2013-11-26 MED ORDER — FENTANYL 2.5 MCG/ML BUPIVACAINE 1/10 % EPIDURAL INFUSION (WH - ANES)
14.0000 mL/h | INTRAMUSCULAR | Status: DC | PRN
  Administered 2013-11-26 (×2): 14 mL/h via EPIDURAL
  Filled 2013-11-26: qty 125

## 2013-11-26 MED ORDER — ONDANSETRON HCL 4 MG/2ML IJ SOLN
INTRAMUSCULAR | Status: AC
  Filled 2013-11-26: qty 2

## 2013-11-26 MED ORDER — LACTATED RINGERS IV SOLN
500.0000 mL | Freq: Once | INTRAVENOUS | Status: AC
  Administered 2013-11-26: 500 mL via INTRAVENOUS

## 2013-11-26 MED ORDER — TERBUTALINE SULFATE 1 MG/ML IJ SOLN: 0.2500 mg | Freq: Once | INTRAMUSCULAR | Status: AC | PRN

## 2013-11-26 MED ORDER — OXYTOCIN 10 UNIT/ML IJ SOLN
INTRAMUSCULAR | Status: AC
  Filled 2013-11-26: qty 4

## 2013-11-26 MED ORDER — LACTATED RINGERS IV SOLN
INTRAVENOUS | Status: DC | PRN
  Administered 2013-11-26 (×3): via INTRAVENOUS

## 2013-11-26 MED ORDER — METHYLERGONOVINE MALEATE 0.2 MG/ML IJ SOLN
INTRAMUSCULAR | Status: AC
  Filled 2013-11-26: qty 1

## 2013-11-26 MED ORDER — METHYLERGONOVINE MALEATE 0.2 MG/ML IJ SOLN
INTRAMUSCULAR | Status: DC | PRN
  Administered 2013-11-26: 0.2 mg via INTRAMUSCULAR

## 2013-11-26 MED ORDER — PHENYLEPHRINE 40 MCG/ML (10ML) SYRINGE FOR IV PUSH (FOR BLOOD PRESSURE SUPPORT)
PREFILLED_SYRINGE | INTRAVENOUS | Status: AC
  Filled 2013-11-26: qty 5

## 2013-11-26 MED ORDER — DIPHENHYDRAMINE HCL 50 MG/ML IJ SOLN: 12.5000 mg | INTRAMUSCULAR | Status: DC | PRN

## 2013-11-26 MED ORDER — PHENYLEPHRINE 40 MCG/ML (10ML) SYRINGE FOR IV PUSH (FOR BLOOD PRESSURE SUPPORT): 80.0000 ug | PREFILLED_SYRINGE | INTRAVENOUS | Status: DC | PRN

## 2013-11-26 MED ORDER — CARBOPROST TROMETHAMINE 250 MCG/ML IM SOLN
INTRAMUSCULAR | Status: AC
  Filled 2013-11-26: qty 1

## 2013-11-26 MED ORDER — FENTANYL CITRATE 0.05 MG/ML IJ SOLN
100.0000 ug | INTRAMUSCULAR | Status: DC | PRN
  Administered 2013-11-26 (×3): 100 ug via INTRAVENOUS
  Filled 2013-11-26 (×3): qty 2

## 2013-11-26 MED ORDER — BUPIVACAINE HCL (PF) 0.5 % IJ SOLN
INTRAMUSCULAR | Status: AC
  Filled 2013-11-26: qty 30

## 2013-11-26 MED ORDER — OXYTOCIN 40 UNITS IN LACTATED RINGERS INFUSION - SIMPLE MED
1.0000 m[IU]/min | INTRAVENOUS | Status: DC
  Administered 2013-11-26: 2 m[IU]/min via INTRAVENOUS
  Filled 2013-11-26: qty 1000

## 2013-11-26 MED ORDER — CEFAZOLIN SODIUM-DEXTROSE 2-3 GM-% IV SOLR
INTRAVENOUS | Status: AC
  Filled 2013-11-26: qty 50

## 2013-11-26 MED ORDER — EPHEDRINE 5 MG/ML INJ
10.0000 mg | INTRAVENOUS | Status: DC | PRN
  Administered 2013-11-26: 10 mg via INTRAVENOUS

## 2013-11-26 SURGICAL SUPPLY — 41 items
BENZOIN TINCTURE PRP APPL 2/3 (GAUZE/BANDAGES/DRESSINGS) ×3 IMPLANT
BINDER ABD UNIV 10 28-50 (GAUZE/BANDAGES/DRESSINGS) ×1 IMPLANT
BINDER ABD UNIV 12 45-62 (WOUND CARE) IMPLANT
BINDER ABDOM UNIV 10 (GAUZE/BANDAGES/DRESSINGS) ×3
BINDER ABDOMINAL 46IN 62IN (WOUND CARE)
CLAMP CORD UMBIL (MISCELLANEOUS) IMPLANT
CLOSURE WOUND 1/2 X4 (GAUZE/BANDAGES/DRESSINGS) ×1
CLOTH BEACON ORANGE TIMEOUT ST (SAFETY) ×3 IMPLANT
DRAPE LG THREE QUARTER DISP (DRAPES) IMPLANT
DRSG OPSITE POSTOP 4X10 (GAUZE/BANDAGES/DRESSINGS) ×3 IMPLANT
DURAPREP 26ML APPLICATOR (WOUND CARE) ×3 IMPLANT
ELECT REM PT RETURN 9FT ADLT (ELECTROSURGICAL) ×3
ELECTRODE REM PT RTRN 9FT ADLT (ELECTROSURGICAL) ×1 IMPLANT
EXTRACTOR VACUUM KIWI (MISCELLANEOUS) IMPLANT
GLOVE BIO SURGEON STRL SZ7 (GLOVE) ×3 IMPLANT
GLOVE BIOGEL PI IND STRL 7.0 (GLOVE) ×1 IMPLANT
GLOVE BIOGEL PI INDICATOR 7.0 (GLOVE) ×2
GOWN STRL REUS W/ TWL XL LVL3 (GOWN DISPOSABLE) ×1 IMPLANT
GOWN STRL REUS W/TWL LRG LVL3 (GOWN DISPOSABLE) ×3 IMPLANT
GOWN STRL REUS W/TWL XL LVL3 (GOWN DISPOSABLE) ×2
KIT ABG SYR 3ML LUER SLIP (SYRINGE) IMPLANT
NEEDLE HYPO 22GX1.5 SAFETY (NEEDLE) ×3 IMPLANT
NEEDLE HYPO 25X5/8 SAFETYGLIDE (NEEDLE) IMPLANT
NS IRRIG 1000ML POUR BTL (IV SOLUTION) ×3 IMPLANT
PACK C SECTION WH (CUSTOM PROCEDURE TRAY) ×3 IMPLANT
PAD OB MATERNITY 4.3X12.25 (PERSONAL CARE ITEMS) ×3 IMPLANT
RTRCTR C-SECT PINK 25CM LRG (MISCELLANEOUS) IMPLANT
SPONGE SURGIFOAM ABS GEL 12-7 (HEMOSTASIS) IMPLANT
STAPLER VISISTAT 35W (STAPLE) IMPLANT
STRIP CLOSURE SKIN 1/2X4 (GAUZE/BANDAGES/DRESSINGS) ×2 IMPLANT
SUT PDS AB 0 CTX 60 (SUTURE) IMPLANT
SUT PLAIN 0 NONE (SUTURE) IMPLANT
SUT SILK 0 TIES 10X30 (SUTURE) IMPLANT
SUT VIC AB 0 CT1 36 (SUTURE) ×12 IMPLANT
SUT VIC AB 3-0 CT1 27 (SUTURE) ×2
SUT VIC AB 3-0 CT1 TAPERPNT 27 (SUTURE) ×1 IMPLANT
SUT VIC AB 4-0 KS 27 (SUTURE) ×3 IMPLANT
SYR CONTROL 10ML LL (SYRINGE) ×3 IMPLANT
TOWEL OR 17X24 6PK STRL BLUE (TOWEL DISPOSABLE) ×3 IMPLANT
TRAY FOLEY CATH 14FR (SET/KITS/TRAYS/PACK) IMPLANT
WATER STERILE IRR 1000ML POUR (IV SOLUTION) IMPLANT

## 2013-11-26 NOTE — Progress Notes (Signed)
Rhonda Graves is a 33 y.o. G1P0 at [redacted]w[redacted]d admitted for induction of labor due to polyhydramnios.  Subjective:  Uncomfortable. Needing IV pain medicine from time to time.  +FM.    Objective: BP 135/81  Pulse 89  Temp(Src) 97.5 F (36.4 C) (Oral)  Resp 18  Ht 5\' 1"  (1.549 m)  Wt 84.369 kg (186 lb)  BMI 35.16 kg/m2  SpO2 99%  LMP 02/16/2013      FHT:  FHR: 120 bpm, variability: moderate,  accelerations:  Present,  decelerations:  Absent UC:   irregular, every 2-4 minutes SVE:   Dilation: 2.5 Effacement (%): 90 Station: -3 Exam by:: Dr. Olevia Bowens  Labs: Lab Results  Component Value Date   WBC 9.7 11/25/2013   HGB 11.9* 11/25/2013   HCT 35.1* 11/25/2013   MCV 91.9 11/25/2013   PLT 244 11/25/2013    Assessment / Plan: IOl due to poly  Labor: s/p cytotec x2. FB placed and inflated with 60cc of LR Fetal Wellbeing:  Category I Pain Control:  Fentanyl I/D:  n/a Anticipated MOD:  NSVD  Cairo Agostinelli L 11/26/2013, 6:22 AM

## 2013-11-26 NOTE — H&P (Signed)
Attestation of Attending Supervision of Advanced Practitioner (CNM/NP): Evaluation and management procedures were performed by the Advanced Practitioner under my supervision and collaboration. I have reviewed the Advanced Practitioner's note and chart, and I agree with the management and plan.  Duong Haydel H. 8:39 AM

## 2013-11-26 NOTE — Transfer of Care (Signed)
Immediate Anesthesia Transfer of Care Note  Patient: Rhonda Graves  Procedure(s) Performed: Procedure(s): CESAREAN SECTION (N/A)  Patient Location: PACU  Anesthesia Type:Epidural  Level of Consciousness: awake, alert , oriented and patient cooperative  Airway & Oxygen Therapy: Patient Spontanous Breathing  Post-op Assessment: Report given to PACU RN and Post -op Vital signs reviewed and stable  Post vital signs: Reviewed and stable  Complications: No apparent anesthesia complications

## 2013-11-26 NOTE — Anesthesia Preprocedure Evaluation (Addendum)
Anesthesia Evaluation  Patient identified by MRN, date of birth, ID band Patient awake    Reviewed: Allergy & Precautions, H&P , NPO status , Patient's Chart, lab work & pertinent test results  Airway Mallampati: II TM Distance: >3 FB Neck ROM: full    Dental no notable dental hx. (+) Teeth Intact   Pulmonary neg pulmonary ROS,  breath sounds clear to auscultation  Pulmonary exam normal       Cardiovascular negative cardio ROS  Rhythm:regular Rate:Normal     Neuro/Psych  Headaches,    GI/Hepatic Neg liver ROS, GERD-  Medicated and Controlled,  Endo/Other  Morbid obesity  Renal/GU negative Renal ROS  negative genitourinary   Musculoskeletal negative musculoskeletal ROS (+)   Abdominal (+) + obese,   Peds  Hematology  (+) anemia ,   Anesthesia Other Findings       Reproductive/Obstetrics (+) Pregnancy                          Anesthesia Physical Anesthesia Plan  ASA: III and emergent  Anesthesia Plan: Epidural   Post-op Pain Management:    Induction:   Airway Management Planned: Natural Airway  Additional Equipment:   Intra-op Plan:   Post-operative Plan:   Informed Consent: I have reviewed the patients History and Physical, chart, labs and discussed the procedure including the risks, benefits and alternatives for the proposed anesthesia with the patient or authorized representative who has indicated his/her understanding and acceptance.   Dental Advisory Given  Plan Discussed with:   Anesthesia Plan Comments: (Labs checked- platelets confirmed with RN in room. Fetal heart tracing, per RN, reported to be stable enough for sitting procedure. Discussed epidural, and patient consents to the procedure:  included risk of possible headache,backache, failed block, allergic reaction, and nerve injury. This patient was asked if she had any questions or concerns before the procedure  started. Patient for C/Section for failure to progress. Will use epidural for C/Section.)       Anesthesia Quick Evaluation

## 2013-11-26 NOTE — Progress Notes (Signed)
Rhonda Graves is a 33 y.o. G1P0 at [redacted]w[redacted]d by ultrasound admitted for induction of labor due to Hydramnios.  Subjective:  Pt c/o feeling tired and increasingly 'depressed and mad at the baby'.  She also reports feeling 'guilty for feeling this way'.  She reports a h/o depression and was on meds in 2013 or 2014.  She reports that her sx have become worse since her ECV 3 weeks prev.  Objective: BP 139/78  Pulse 90  Temp(Src) 98.4 F (36.9 C) (Oral)  Resp 18  Ht 5\' 1"  (1.549 m)  Wt 186 lb (84.369 kg)  BMI 35.16 kg/m2  SpO2 93%  LMP 02/16/2013 I/O last 3 completed shifts: In: -  Out: 760 [Urine:700; Emesis/NG output:60] Total I/O In: -  Out: 200 [Urine:200]  FHT:  FHR: 120's bpm, variability: moderate,  accelerations:  Present,  decelerations:  Absent UC:   regular, every 2 minutes SVE:   Dilation: 6 Effacement (%): 60 Station: Ballotable Exam by:: Dr Ihor Dow  Labs: Lab Results  Component Value Date   WBC 9.7 11/25/2013   HGB 11.9* 11/25/2013   HCT 35.1* 11/25/2013   MCV 91.9 11/25/2013   PLT 244 11/25/2013    Assessment / Plan: Arrest of active phase of labor.  No cervical change in >6 hours despite adequate labor   Fetal Wellbeing:  Category I Pain Control:  Epidural I/D:  n/a Anticipated MOD:  Will proceed to c-section.  Patient desires surgical management with primary c-section due to failure to descend; arrest of dilation despite adequate labor; polyhydramnios and maternal exhaustion. The risks of surgery were discussed in detail with the patient including but not limited to: bleeding which may require transfusion or reoperation; infection which may require prolonged hospitalization or re-hospitalization and antibiotic therapy; injury to bowel, bladder, ureters and major vessels or other surrounding organs; need for additional procedures including laparotomy; thromboembolic phenomenon, incisional problems and other postoperative or anesthesia complications.  Patient was told  that the likelihood that her condition and symptoms will be treated effectively with this surgical management was very high; the postoperative expectations were also discussed in detail. The patient also understands the alternative treatment options which were discussed in full. All questions were answered.    HARRAWAY-SMITH, Krimson Massmann 11/26/2013, 9:17 PM

## 2013-11-26 NOTE — Progress Notes (Signed)
Rhonda Graves is a 33 y.o. G1P0 at [redacted]w[redacted]d admitted for induction of labor due to polyhydramnios   Subjective: Pt is comfortable, doing well. No pain with epidural  Objective: BP 118/66  Pulse 79  Temp(Src) 97.7 F (36.5 C) (Axillary)  Resp 18  Ht 5\' 1"  (1.549 m)  Wt 84.369 kg (186 lb)  BMI 35.16 kg/m2  SpO2 93%  LMP 02/16/2013   Total I/O In: -  Out: 60 [Emesis/NG output:60] FHT:  FHR: 120 bpm, variability: moderate,  accelerations: occasional Present,  decelerations:  Occasional minimal variable UC:   regular, every 2-3 minutes SVE:   Dilation: 6 Effacement (%): 50 Station: -3 Exam by:: Mika Anastasi  Labs: Lab Results  Component Value Date   WBC 9.7 11/25/2013   HGB 11.9* 11/25/2013   HCT 35.1* 11/25/2013   MCV 91.9 11/25/2013   PLT 244 11/25/2013    Assessment / Plan: Induction of labor due to polyhydramnios  Labor: s/p cytotec x2 and FB, now on pitocin, once head is engaged will AROM Fetal Wellbeing:  Category II, overall reassuring and reactive Pain Control:  Fentanyl I/D:  n/a Anticipated MOD:  NSVD  Torra Pala RYAN 11/26/2013, 12:59 PM

## 2013-11-26 NOTE — Brief Op Note (Signed)
11/25/2013 - 11/26/2013  11:50 PM  PATIENT:  Rhonda Graves  33 y.o. female  PRE-OPERATIVE DIAGNOSIS:  Failed Induction  POST-OPERATIVE DIAGNOSIS:  Failed Induction  PROCEDURE:  Procedure(s): CESAREAN SECTION (N/A)  SURGEON:  Surgeon(s) and Role:    * Lavonia Drafts, MD - Primary  ANESTHESIA:   epidural  EBL:  Total I/O In: 3300 [I.V.:3300] Out: 1400 [Urine:600; Blood:800]  BLOOD ADMINISTERED:none  DRAINS: none   LOCAL MEDICATIONS USED:  MARCAINE     SPECIMEN:  Source of Specimen:  placenta  DISPOSITION OF SPECIMEN:  PATHOLOGY  COUNTS:  YES  TOURNIQUET:  * No tourniquets in log *  DICTATION: .Note written in EPIC  PLAN OF CARE: Admit to inpatient   PATIENT DISPOSITION:  PACU - hemodynamically stable.   Delay start of Pharmacological VTE agent (>24hrs) due to surgical blood loss or risk of bleeding: yes

## 2013-11-26 NOTE — Progress Notes (Signed)
Rhonda Graves is a 33 y.o. G1P0 at [redacted]w[redacted]d admitted for induction of labor due to polyhydramnios.  Subjective: Pt comfortable. Doing well. +FM.   Objective: BP 135/81  Pulse 89  Temp(Src) 97.5 F (36.4 C) (Oral)  Resp 18  Ht 5\' 1"  (1.549 m)  Wt 84.369 kg (186 lb)  BMI 35.16 kg/m2  SpO2 99%  LMP 02/16/2013      FHT:  FHR: 120s bpm, variability: moderate,  accelerations:  Present,  decelerations:  Absent UC:   rare SVE:   Dilation: 1 Effacement (%): 40 Station: Ballotable Exam by:: Dr. Olevia Bowens  Labs: Lab Results  Component Value Date   WBC 9.7 11/25/2013   HGB 11.9* 11/25/2013   HCT 35.1* 11/25/2013   MCV 91.9 11/25/2013   PLT 244 11/25/2013    Assessment / Plan: IOL due to poly  Labor: on cytotec for cervical ripening. 66mcg PV placed again Fetal Wellbeing:  Category I Pain Control:  Labor support without medications I/D:  n/a Anticipated MOD:  NSVD  Rhonda Graves L 11/26/2013, 12:28 AM

## 2013-11-26 NOTE — Consult Note (Signed)
Neonatology Note:   Attendance at C-section:    I was asked by Dr. Ihor Dow to attend this primary C/S at term due to [facility]. The mother is a G1P0 A pos, GBS neg with polyhydramnios and IBS. ROM 8 hours prior to delivery, fluid clear. Infant vigorous with good spontaneous cry and tone. Needed no suctioning. Ap 9/9. Lungs clear to ausc in DR. To CN to care of Pediatrician.   Real Cons, MD

## 2013-11-26 NOTE — Progress Notes (Signed)
Dr. Ihor Dow discussed risks and benefits of cesarean section with patient. Doctor answered all of patient questions. Decision made to do cesarean section. Patient discussed history of depression and concern about post-partum depression, states she is "angry at the baby for staying in there for so long." Dr. Ihor Dow states she will start patient on anti-depressant tomorrow for prevention.

## 2013-11-26 NOTE — Anesthesia Procedure Notes (Signed)

## 2013-11-26 NOTE — Progress Notes (Signed)
Rhonda Graves is a 33 y.o. G1P0 at [redacted]w[redacted]d by LMP admitted for induction of labor due to polyhydramnios.  Subjective: Minimal pain suprapubic pain, some HA,   Objective: BP 108/47  Pulse 61  Temp(Src) 98.4 F (36.9 C) (Oral)  Resp 20  Ht 5\' 1"  (1.549 m)  Wt 84.369 kg (186 lb)  BMI 35.16 kg/m2  SpO2 93%  LMP 02/16/2013   Total I/O In: -  Out: 60 [Emesis/NG output:60]  FHT:  FHR: 120 bpm, variability: moderate,  accelerations:  Abscent,  decelerations:  Absent UC:   regular, every 1-2 minutes SVE: Dilation: 7 Effacement (%): 90 Cervical Position: Anterior Station: -2 Presentation: Vertex Exam by:: [name], MD Labs: Lab Results  Component Value Date   WBC 9.7 11/25/2013   HGB 11.9* 11/25/2013   HCT 35.1* 11/25/2013   MCV 91.9 11/25/2013   PLT 244 11/25/2013    Assessment / Plan: Induction of labor due to polyhydramnios,  progressing well on pitocin    Labor: Progressing normally; AROM at ~15:00 Preeclampsia:  N/A Fetal Wellbeing:  Category II Pain Control:  Fentanyl I/D:  GBS neg Anticipated MOD:  NSVD  [name] 11/26/2013, 4:44 PM

## 2013-11-26 NOTE — Progress Notes (Signed)
Rhonda Graves is a 33 y.o. G1P0 at [redacted]w[redacted]d admitted for induction of labor due to polyhydramnios   Subjective: Pt is comfortable, doing well.  Objective: BP 115/73  Pulse 78  Temp(Src) 97.7 F (36.5 C) (Axillary)  Resp 16  Ht 5\' 1"  (1.549 m)  Wt 84.369 kg (186 lb)  BMI 35.16 kg/m2  SpO2 93%  LMP 02/16/2013     FHT:  FHR: 120 bpm, variability: moderate,  accelerations:  Present,  decelerations:  Occasional minimal variable UC:   regular, every 2-3 minutes SVE:   Dilation: 5 Effacement (%): 50 Station: Ballotable Exam by:: dr. hall/dr.Breindy Meadow  Labs: Lab Results  Component Value Date   WBC 9.7 11/25/2013   HGB 11.9* 11/25/2013   HCT 35.1* 11/25/2013   MCV 91.9 11/25/2013   PLT 244 11/25/2013    Assessment / Plan: Induction of labor due to polyhydramnios  Labor: s/p cytotec x2 and FB Fetal Wellbeing:  Category II Pain Control:  Fentanyl I/D:  n/a Anticipated MOD:  NSVD  Jeanett Schlein T 11/26/2013, 10:44 AM  I spoke with and examined patient and agree with resident's note and plan of care.  Fredrik Rigger, MD OB Fellow 11/26/2013 12:59 PM

## 2013-11-26 NOTE — Op Note (Signed)
11/25/2013 - 11/26/2013  11:50 PM  PATIENT:  Rhonda Graves  33 y.o. female  PRE-OPERATIVE DIAGNOSIS:  Failed Induction  POST-OPERATIVE DIAGNOSIS:  Failed Induction  PROCEDURE:  Procedure(s): CESAREAN SECTION (N/A)  SURGEON:  Surgeon(s) and Role:    * Lavonia Drafts, MD - Primary  ANESTHESIA:   epidural  EBL:  Total I/O In: 3300 [I.V.:3300] Out: 1400 [Urine:600; Blood:800]  BLOOD ADMINISTERED:none  DRAINS: none   LOCAL MEDICATIONS USED:  MARCAINE     SPECIMEN:  Source of Specimen:  placenta  DISPOSITION OF SPECIMEN:  PATHOLOGY  COUNTS:  YES  TOURNIQUET:  * No tourniquets in log *  DICTATION: .Note written in EPIC  PLAN OF CARE: Admit to inpatient   PATIENT DISPOSITION:  PACU - hemodynamically stable.   Delay start of Pharmacological VTE agent (>24hrs) due to surgical blood loss or risk of bleeding: yes  NDICATIONS: Rhonda Graves is a 33 y.o. G1P1001 at [redacted]w[redacted]d here for cesarean section secondary to the indications listed under preoperative diagnosis; please see preoperative note for further details.  The risks of cesarean section were discussed with the patient including but were not limited to: bleeding which may require transfusion or reoperation; infection which may require antibiotics; injury to bowel, bladder, ureters or other surrounding organs; injury to the fetus; need for additional procedures including hysterectomy in the event of a life-threatening hemorrhage; placental abnormalities wth subsequent pregnancies, incisional problems, thromboembolic phenomenon and other postoperative/anesthesia complications.   The patient concurred with the proposed plan, giving informed written consent for the procedure.    FINDINGS:  Viable female infant in cephalic presentation. Occiput posterior with nuchal cord wrapped around the feet x 2. Apgars pending.  Clear amniotic fluid.  Intact placenta, three vessel cord.  Normal uterus, fallopian tubes and ovaries  bilaterally.  PROCEDURE IN DETAIL:  The patient preoperatively received intravenous antibiotics and had sequential compression devices applied to her lower extremities.  She was then taken to the operating room where spinal anesthesia was administered and was found to be adequate. She was then placed in a dorsal supine position with a leftward tilt, and prepped and draped in a sterile manner.  A foley catheter was placed into her bladder and attached to constant gravity.  After an adequate timeout was performed, a Pfannenstiel skin incision was made with scalpel and carried through to the underlying layer of fascia. The fascia was incised in the midline, and this incision was extended bilaterally using the Mayo scissors.  Kocher clamps were applied to the superior aspect of the fascial incision and the underlying rectus muscles were dissected off bluntly. A similar process was carried out on the inferior aspect of the fascial incision. The rectus muscles were separated in the midline bluntly and the peritoneum was entered bluntly. Attention was turned to the lower uterine segment where a low transverse hysterotomy incision was made with a scalpel and extended bilaterally bluntly.  The infant was successfully delivered, the cord was clamped and cut and the infant was handed over to awaiting neonatology team. Uterine massage was then administered, and the placenta delivered intact with a three-vessel cord. The uterus was then cleared of clot and debris.  The hysterotomy was closed with 0 Vicryl in a running locked fashion, and an imbricating layer was also placed with the same suture. The uterus remained boggy and pt was given methergine x1 and hemobate x 1 with good contraction of the uterus.  Several figure-of-eight sutures were placed.   Hemostasis was confirmed.  The  pelvis was cleared of all clot and debris. Hemostasis was confirmed on all surfaces.  The peritoneum and the muscles were reapproximated using 0  Vicryl in 1 interrupted suture. The fascia was then closed using 0 Vicryl in a running fashion.  The subcutaneous layer was irrigated, then reapproximated with 3-0 vicryl in a running locked fashion, and the skin was closed with a 4-0 Vicryl subcuticular stitch.  30 cc of 0.5% marcaine was injected into the incision and benzoin and steristrips were applied.  The patient tolerated the procedure well. Sponge, lap, instrument and needle counts were correct x 2.  She was taken to the recovery room in stable condition.

## 2013-11-27 ENCOUNTER — Encounter (HOSPITAL_COMMUNITY): Payer: Self-pay

## 2013-11-27 DIAGNOSIS — Z9889 Other specified postprocedural states: Secondary | ICD-10-CM

## 2013-11-27 LAB — CBC
HCT: 31.6 % — ABNORMAL LOW (ref 36.0–46.0)
HEMOGLOBIN: 10.7 g/dL — AB (ref 12.0–15.0)
MCH: 31.7 pg (ref 26.0–34.0)
MCHC: 33.9 g/dL (ref 30.0–36.0)
MCV: 93.5 fL (ref 78.0–100.0)
Platelets: 197 10*3/uL (ref 150–400)
RBC: 3.38 MIL/uL — AB (ref 3.87–5.11)
RDW: 13.6 % (ref 11.5–15.5)
WBC: 18.8 10*3/uL — ABNORMAL HIGH (ref 4.0–10.5)

## 2013-11-27 MED ORDER — BUPIVACAINE HCL (PF) 0.5 % IJ SOLN
INTRAMUSCULAR | Status: DC | PRN
  Administered 2013-11-26: 30 mL

## 2013-11-27 MED ORDER — SCOPOLAMINE 1 MG/3DAYS TD PT72: 1.0000 | MEDICATED_PATCH | Freq: Once | TRANSDERMAL | Status: DC

## 2013-11-27 MED ORDER — DIPHENHYDRAMINE HCL 25 MG PO CAPS
25.0000 mg | ORAL_CAPSULE | ORAL | Status: DC | PRN
  Administered 2013-11-27: 25 mg via ORAL
  Filled 2013-11-27: qty 1

## 2013-11-27 MED ORDER — SIMETHICONE 80 MG PO CHEW
80.0000 mg | CHEWABLE_TABLET | Freq: Three times a day (TID) | ORAL | Status: DC
  Administered 2013-11-27 – 2013-11-28 (×4): 80 mg via ORAL
  Filled 2013-11-27 (×5): qty 1

## 2013-11-27 MED ORDER — LANOLIN HYDROUS EX OINT: 1.0000 "application " | TOPICAL_OINTMENT | CUTANEOUS | Status: DC | PRN

## 2013-11-27 MED ORDER — KETOROLAC TROMETHAMINE 30 MG/ML IJ SOLN
INTRAMUSCULAR | Status: AC
  Filled 2013-11-27: qty 1

## 2013-11-27 MED ORDER — SENNOSIDES-DOCUSATE SODIUM 8.6-50 MG PO TABS
2.0000 | ORAL_TABLET | ORAL | Status: DC
  Administered 2013-11-28: 2 via ORAL
  Filled 2013-11-27: qty 2

## 2013-11-27 MED ORDER — FENTANYL CITRATE 0.05 MG/ML IJ SOLN
25.0000 ug | INTRAMUSCULAR | Status: DC | PRN
  Administered 2013-11-27: 50 ug via INTRAVENOUS

## 2013-11-27 MED ORDER — SCOPOLAMINE 1 MG/3DAYS TD PT72
MEDICATED_PATCH | TRANSDERMAL | Status: AC
  Administered 2013-11-27: 1.5 mg
  Filled 2013-11-27: qty 1

## 2013-11-27 MED ORDER — DIPHENHYDRAMINE HCL 50 MG/ML IJ SOLN: 12.5000 mg | INTRAMUSCULAR | Status: DC | PRN

## 2013-11-27 MED ORDER — ACETAMINOPHEN 500 MG PO TABS
1000.0000 mg | ORAL_TABLET | Freq: Four times a day (QID) | ORAL | Status: DC | PRN
  Administered 2013-11-27: 1000 mg via ORAL
  Filled 2013-11-27: qty 2

## 2013-11-27 MED ORDER — DIBUCAINE 1 % RE OINT
1.0000 | TOPICAL_OINTMENT | RECTAL | Status: DC | PRN
Start: 2013-11-27 — End: 2013-11-28

## 2013-11-27 MED ORDER — NALOXONE HCL 0.4 MG/ML IJ SOLN: 0.4000 mg | INTRAMUSCULAR | Status: DC | PRN

## 2013-11-27 MED ORDER — ONDANSETRON HCL 4 MG/2ML IJ SOLN: 4.0000 mg | INTRAMUSCULAR | Status: DC | PRN

## 2013-11-27 MED ORDER — SIMETHICONE 80 MG PO CHEW: 80.0000 mg | CHEWABLE_TABLET | ORAL | Status: DC | PRN

## 2013-11-27 MED ORDER — DIPHENHYDRAMINE HCL 25 MG PO CAPS: 25.0000 mg | ORAL_CAPSULE | Freq: Four times a day (QID) | ORAL | Status: DC | PRN

## 2013-11-27 MED ORDER — ONDANSETRON HCL 4 MG/2ML IJ SOLN: 4.0000 mg | Freq: Three times a day (TID) | INTRAMUSCULAR | Status: DC | PRN

## 2013-11-27 MED ORDER — MENTHOL 3 MG MT LOZG: 1.0000 | LOZENGE | OROMUCOSAL | Status: DC | PRN

## 2013-11-27 MED ORDER — NALBUPHINE HCL 10 MG/ML IJ SOLN
5.0000 mg | INTRAMUSCULAR | Status: DC | PRN
  Administered 2013-11-27 (×3): 10 mg via INTRAVENOUS
  Filled 2013-11-27 (×3): qty 1

## 2013-11-27 MED ORDER — ZOLPIDEM TARTRATE 5 MG PO TABS: 5.0000 mg | ORAL_TABLET | Freq: Every evening | ORAL | Status: DC | PRN

## 2013-11-27 MED ORDER — HYDROMORPHONE HCL PF 1 MG/ML IJ SOLN
1.0000 mg | Freq: Once | INTRAMUSCULAR | Status: AC
  Administered 2013-11-27: 1 mg via INTRAVENOUS
  Filled 2013-11-27: qty 1

## 2013-11-27 MED ORDER — OXYTOCIN 40 UNITS IN LACTATED RINGERS INFUSION - SIMPLE MED: 62.5000 mL/h | INTRAVENOUS | Status: AC

## 2013-11-27 MED ORDER — SERTRALINE HCL 50 MG PO TABS
50.0000 mg | ORAL_TABLET | Freq: Every day | ORAL | Status: DC
  Administered 2013-11-28: 50 mg via ORAL
  Filled 2013-11-27 (×3): qty 1

## 2013-11-27 MED ORDER — NALBUPHINE HCL 10 MG/ML IJ SOLN
5.0000 mg | INTRAMUSCULAR | Status: DC | PRN
  Administered 2013-11-27: 10 mg via SUBCUTANEOUS
  Filled 2013-11-27: qty 1

## 2013-11-27 MED ORDER — OXYCODONE-ACETAMINOPHEN 5-325 MG PO TABS
1.0000 | ORAL_TABLET | ORAL | Status: DC | PRN
  Administered 2013-11-27 – 2013-11-28 (×3): 1 via ORAL
  Filled 2013-11-27 (×3): qty 1

## 2013-11-27 MED ORDER — IBUPROFEN 600 MG PO TABS
600.0000 mg | ORAL_TABLET | Freq: Four times a day (QID) | ORAL | Status: DC
  Administered 2013-11-27 – 2013-11-28 (×7): 600 mg via ORAL
  Filled 2013-11-27 (×7): qty 1

## 2013-11-27 MED ORDER — FENTANYL CITRATE 0.05 MG/ML IJ SOLN
INTRAMUSCULAR | Status: AC
  Filled 2013-11-27: qty 2

## 2013-11-27 MED ORDER — KETOROLAC TROMETHAMINE 30 MG/ML IJ SOLN: 30.0000 mg | Freq: Four times a day (QID) | INTRAMUSCULAR | Status: AC | PRN

## 2013-11-27 MED ORDER — TETANUS-DIPHTH-ACELL PERTUSSIS 5-2.5-18.5 LF-MCG/0.5 IM SUSP: 0.5000 mL | Freq: Once | INTRAMUSCULAR | Status: DC

## 2013-11-27 MED ORDER — PRENATAL MULTIVITAMIN CH
1.0000 | ORAL_TABLET | Freq: Every day | ORAL | Status: DC
  Administered 2013-11-27 – 2013-11-28 (×2): 1 via ORAL
  Filled 2013-11-27 (×2): qty 1

## 2013-11-27 MED ORDER — DIPHENHYDRAMINE HCL 50 MG/ML IJ SOLN: 25.0000 mg | INTRAMUSCULAR | Status: DC | PRN

## 2013-11-27 MED ORDER — ONDANSETRON HCL 4 MG PO TABS: 4.0000 mg | ORAL_TABLET | ORAL | Status: DC | PRN

## 2013-11-27 MED ORDER — SODIUM CHLORIDE 0.9 % IJ SOLN: 3.0000 mL | INTRAMUSCULAR | Status: DC | PRN

## 2013-11-27 MED ORDER — KETOROLAC TROMETHAMINE 30 MG/ML IJ SOLN
30.0000 mg | Freq: Four times a day (QID) | INTRAMUSCULAR | Status: AC | PRN
  Administered 2013-11-27: 30 mg via INTRAVENOUS

## 2013-11-27 MED ORDER — WITCH HAZEL-GLYCERIN EX PADS: 1.0000 "application " | MEDICATED_PAD | CUTANEOUS | Status: DC | PRN

## 2013-11-27 MED ORDER — SIMETHICONE 80 MG PO CHEW
80.0000 mg | CHEWABLE_TABLET | ORAL | Status: DC
  Administered 2013-11-28: 80 mg via ORAL
  Filled 2013-11-27: qty 1

## 2013-11-27 MED ORDER — METOCLOPRAMIDE HCL 5 MG/ML IJ SOLN: 10.0000 mg | Freq: Three times a day (TID) | INTRAMUSCULAR | Status: DC | PRN

## 2013-11-27 MED ORDER — MEPERIDINE HCL 25 MG/ML IJ SOLN: 6.2500 mg | INTRAMUSCULAR | Status: DC | PRN

## 2013-11-27 MED ORDER — LACTATED RINGERS IV SOLN
INTRAVENOUS | Status: DC
  Administered 2013-11-27: 03:00:00 via INTRAVENOUS

## 2013-11-27 MED ORDER — NALOXONE HCL 1 MG/ML IJ SOLN
1.0000 ug/kg/h | INTRAVENOUS | Status: DC | PRN
  Filled 2013-11-27: qty 2

## 2013-11-27 MED ORDER — MEASLES, MUMPS & RUBELLA VAC ~~LOC~~ INJ
0.5000 mL | INJECTION | Freq: Once | SUBCUTANEOUS | Status: DC
  Filled 2013-11-27: qty 0.5

## 2013-11-27 NOTE — Addendum Note (Signed)
Addendum created 11/27/13 4497 by Talbot Grumbling, CRNA   Modules edited: Notes Section   Notes Section:  File: [id number]

## 2013-11-27 NOTE — Progress Notes (Signed)

## 2013-11-27 NOTE — Anesthesia Postprocedure Evaluation (Signed)
  Anesthesia Post-op Note  Patient: Rhonda Graves  Procedure(s) Performed: Procedure(s): CESAREAN SECTION (N/A)  Patient Location: PACU  Anesthesia Type:Epidural  Level of Consciousness: awake, alert  and oriented  Airway and Oxygen Therapy: Patient Spontanous Breathing  Post-op Pain: none  Post-op Assessment: Post-op Vital signs reviewed, Patient's Cardiovascular Status Stable, Respiratory Function Stable, Patent Airway, No signs of Nausea or vomiting, Pain level controlled, No headache and No backache  Post-op Vital Signs: Reviewed and stable  Complications: No apparent anesthesia complications

## 2013-11-27 NOTE — Progress Notes (Signed)
I have seen and examined this patient and I agree with the above. Will start pt on Zoloft 50mg  qd; in the past she took Zoloft and Buspar with good effect. Plan to have a 2wk PP mental health eval at the clinic. Serita Grammes 9:50 AM 11/27/2013

## 2013-11-27 NOTE — Anesthesia Postprocedure Evaluation (Signed)
Anesthesia Post Note  Patient: Rhonda Graves  Procedure(s) Performed: Procedure(s) (LRB): CESAREAN SECTION (N/A)  Anesthesia type: Epidural  Patient location: Mother/Baby  Post pain: Pain level controlled  Post assessment: Post-op Vital signs reviewed  Last Vitals:  Filed Vitals:   11/27/13 0745  BP: 121/67  Pulse: 60  Temp: 36.9 C  Resp: 18    Post vital signs: Reviewed  Level of consciousness: awake  Complications: No apparent anesthesia complications

## 2013-11-27 NOTE — Lactation Note (Signed)
This note was copied from the chart of Rhonda Arlett Coco. Lactation Consultation Note  Patient Name: Rhonda Graves [id number] Date: 11/27/2013 Reason for consult: Follow-up assessment Assisted Mom with positioning and latching baby. Baby is not obtaining good depth with latch. Baby nursed with lots of stimulation for 10 minutes, no swallows noted, breast compression visible. Mom had some bruising already present on the right nipple. Initiated a #20 nipple shield, baby latched well, sustained a good suckling rhythm, was engaged at the breast, colostrum present in the nipple shield at the end of the feeding. Advised Mom to use the nipple shield to latch baby. Ask for assistance with feedings till she is more confident. Care for sore nipple reviewed, comfort gels given with instructions.   Maternal Data Formula Feeding for Exclusion: No Infant to breast within first hour of birth: No Breastfeeding delayed due to:: Maternal status Has patient been taught Hand Expression?: Yes Does the patient have breastfeeding experience prior to this delivery?: No  Feeding Feeding Type: Breast Fed Length of feed:  (initiated #20 nipple shield)  LATCH Score/Interventions Latch:  (using #20 nipple shield) Intervention(s): Adjust position;Assist with latch  Audible Swallowing: A few with stimulation  Type of Nipple:  (short nipple shaft) Intervention(s): Shells;Hand pump  Comfort (Breast/Nipple): Filling, red/small blisters or bruises, mild/mod discomfort  Problem noted: Cracked, bleeding, blisters, bruises;Mild/Moderate discomfort Interventions  (Cracked/bleeding/bruising/blister): Expressed breast milk to nipple;Hand pump Interventions (Mild/moderate discomfort): Hand massage;Hand expression;Comfort gels  Hold (Positioning): Assistance needed to correctly position infant at breast and maintain latch. Intervention(s): Breastfeeding basics reviewed;Support Pillows  LATCH Score: 7  Lactation Tools  Discussed/Used Tools: Shells;Nipple Shields;Pump;Comfort gels Nipple shield size: 20 Shell Type: Inverted Breast pump type: Manual WIC Program: Yes   Consult Status Consult Status: Follow-up Date: 11/28/13 Follow-up type: In-patient    [name] 11/27/2013, 9:51 PM

## 2013-11-27 NOTE — Progress Notes (Signed)
Subjective: Postpartum Day #1: Cesarean Delivery Patient reports incisional pain and tolerating PO.    Objective: Vital signs in last 24 hours: Temp:  [97.4 F (36.3 C)-98.5 F (36.9 C)] 98.4 F (36.9 C) (03/07 0745) Pulse Rate:  [51-91] 60 (03/07 0745) Resp:  [14-21] 18 (03/07 0745) BP: (85-145)/(43-104) 121/67 mmHg (03/07 0745) SpO2:  [93 %-100 %] 97 % (03/07 0745)  Physical Exam:  General: alert, cooperative and mild distress Lochia: appropriate Uterine Fundus: firm Incision: no significant drainage, no dehiscence, no significant erythema DVT Evaluation: No evidence of DVT seen on physical exam. Negative Homan's sign.   Recent Labs  11/25/13 1825 11/27/13 0624  HGB 11.9* 10.7*  HCT 35.1* 31.6*    Assessment/Plan: Status post Cesarean section. Postoperative course complicated by incisional pain 4/10.  Continue current care.  Patient should be seen by provider due to mental health concerns.  Hx of depression and negative statements with regard to infant and delivery.  Ivin Booty 11/27/2013, 8:56 AM

## 2013-11-28 ENCOUNTER — Other Ambulatory Visit: Payer: Self-pay | Admitting: Medical

## 2013-11-28 DIAGNOSIS — R52 Pain, unspecified: Principal | ICD-10-CM

## 2013-11-28 DIAGNOSIS — O9089 Other complications of the puerperium, not elsewhere classified: Secondary | ICD-10-CM

## 2013-11-28 MED ORDER — OXYCODONE-ACETAMINOPHEN 5-325 MG PO TABS: 1.0000 | ORAL_TABLET | ORAL | Status: DC | PRN

## 2013-11-28 MED ORDER — DIPHENHYDRAMINE HCL 25 MG PO CAPS: 25.0000 mg | ORAL_CAPSULE | Freq: Four times a day (QID) | ORAL | Status: DC | PRN

## 2013-11-28 MED ORDER — DOCUSATE SODIUM 100 MG PO CAPS: 100.0000 mg | ORAL_CAPSULE | Freq: Two times a day (BID) | ORAL | Status: DC

## 2013-11-28 MED ORDER — IBUPROFEN 600 MG PO TABS: 600.0000 mg | ORAL_TABLET | Freq: Four times a day (QID) | ORAL | Status: DC

## 2013-11-28 NOTE — Discharge Summary (Signed)
Obstetric Discharge Summary Reason for Admission: induction of labor  Prenatal Procedures: ultrasound Intrapartum Procedures: cesarean: low cervical, transverse Postpartum Procedures: none Complications-Operative and Postpartum: none Hemoglobin  Date Value Ref Range Status  11/27/2013 10.7* 12.0 - 15.0 g/dL Final     HCT  Date Value Ref Range Status  11/27/2013 31.6* 36.0 - 46.0 % Final    Physical Exam:  General: alert, cooperative and no distress Lochia: appropriate Uterine Fundus: firm Incision: healing well DVT Evaluation: No evidence of DVT seen on physical exam.  Discharge Diagnoses: Failed induction  Discharge Information: Date: 11/28/2013 Activity: pelvic rest Diet: routine Medications: Tylenol #3, Colace and Percocet Condition: stable Instructions: refer to practice specific booklet Discharge to: home Follow-up Information   Follow up with [name] In 2 weeks. (We would like to see you in 2 weeks to evaluate any depressive symptoms.)    Contact information:   [place] [address] [phone]     Hospital Course:  [name] presented for IOL for polyhydramnios and was induced with cytotec, a foley bulb and then pitocin. She became stalled in labor with poor application of fetal head, and was scheduled for cesarean section which was completed without complications, resulting in a viable female. She is meeting discharge criteria.   Newborn Data: Live born female  Birth Weight: 7 lb 4.8 oz (3310 g) APGAR: 9, 9  Home with mother and and baby.  [name] 11/28/2013, 7:23 AM  Hospital Course: Rhonda Graves is a 33 y.o. female G1 at [redacted]w[redacted]d presenting for IOL per recommendation of MFM indicated by polyhydramnios with AFI today 31.  Plans waterbirth. No blood products.  PNC at [facility] significant for breech with successful version; borderline BP elevations and H/As with normal PreE labs, 24 hr pro 112 on 11/17/13.  Delivery  Note: 11/25/2013 - 11/26/2013  11:50 PM  PATIENT: Rhonda Graves 33 y.o. female  PRE-OPERATIVE DIAGNOSIS: Failed Induction  POST-OPERATIVE DIAGNOSIS: Failed Induction  PROCEDURE: Procedure(s):  CESAREAN SECTION (N/A)  SURGEON: Surgeon(s) and Role:  * Lavonia Drafts, MD - Primary  ANESTHESIA: epidural  EBL: Total I/O  In: 3300 [I.V.:3300]  Out: 1400 [Urine:600; Blood:800]  BLOOD ADMINISTERED:none  DRAINS: none  LOCAL MEDICATIONS USED: MARCAINE  SPECIMEN: Source of Specimen: placenta  DISPOSITION OF SPECIMEN: PATHOLOGY  COUNTS: YES  TOURNIQUET: * No tourniquets in log *  DICTATION: .Note written in EPIC  PLAN OF CARE: Admit to inpatient  PATIENT DISPOSITION: PACU - hemodynamically stable.  Delay start of Pharmacological VTE agent (>24hrs) due to surgical blood loss or risk of bleeding: yes    Has done well postoperatively.  Wants to go home a day early today. Up and around. Taking POs with pain well controlled  Agree with rest of note.  Seen and examined by me also. Seabron Spates, CNM

## 2013-11-28 NOTE — Lactation Note (Signed)
This note was copied from the chart of Rhonda Graves. Lactation Consultation Note  Patient Name: Rhonda Graves Date: 11/28/2013 Reason for consult: Follow-up assessment 2nd visit this afternoon , baby still hungry , mom decided to stay and have baby be made a patient. LC assisted mom to re- latch with #20 Nipple shield and have her pump with an electric pump on the other breast while  Feeding on the right to enhance let down. Increased swallows noted and milk noted in the nipple shield after baby released. Pointed out to mom nutritive feeding pattern compared to non-nutritive pattern. Nipple appeared normal shape when baby  Released. Relatched on the left breast and had mom pump with a DEBP on the right , increased swallows noted , especially with  Breast compressions. Nipple appeared normal shape when Rhonda Graves released and increased flow in the nipple shield .   Lactation Plan of Care - Sore nipple tx - Comfort gels after feedings, and then shells for semi compressible areolas.                                       - Prior to latch - breast massage , hand express, prepump to make the nipple and areola more elastic, apply #20 Nipple shield                                        - Latch with firm support and depth , checking lip line for flanged lips                                         - If mom feels comfortable pump with electric pump on the other breast until the 1st letdown and shut off pump                                         - After baby feeds and settles down , post pump 10 -15 mins - save milk for next feed.    Maternal Data Has patient been taught Hand Expression?: Yes  Feeding Feeding Type: Breast Fed (left breast , nipple shield #20, and pumping opposite breast with feeding ) Length of feed: 10 min  LATCH Score/Interventions Latch: Grasps breast easily, tongue down, lips flanged, rhythmical sucking. (#20 NS ) Intervention(s): Skin to skin;Teach feeding  cues;Waking techniques Intervention(s): Adjust position;Assist with latch;Breast massage;Breast compression  Audible Swallowing: A few with stimulation  Type of Nipple: Everted at rest and after stimulation (nipple more eretc with pumping )  Comfort (Breast/Nipple): Soft / non-tender     Hold (Positioning): Assistance needed to correctly position infant at breast and maintain latch. Intervention(s): Breastfeeding basics reviewed  LATCH Score: 8  Lactation Tools Discussed/Used Tools: Shells;Pump;Nipple Shields Nipple shield size: 20 Shell Type: Inverted Breast pump type: Double-Electric Breast Pump (pump changed by LC to DEBP ) WIC Program: Yes (faxed [employer] form for loaner DEBP for [employer] ) Pump Review: Setup, frequency, and cleaning;Milk Storage Initiated by:: MAI  Date initiated:: 11/28/13   Consult Status Consult Status: Follow-up Date: 11/29/13 Follow-up type: In-patient    Rhonda So  Graves 11/28/2013, 4:16 PM

## 2013-11-28 NOTE — Discharge Instructions (Signed)
Pain Relief Preoperatively and Postoperatively °Being a good patient does not mean being a silent one. If you have questions, problems, or concerns about the pain you may feel after surgery, let your caregiver know. Patients have the right to assessment and management of pain. The treatment of pain after surgery is important to speed up recovery and return to normal activities. Severe pain after surgery, and the fear or anxiety associated with that pain, may cause extreme discomfort that: °· Prevents sleep. °· Decreases the ability to breathe deeply and cough. This can cause pneumonia or other upper airway infections. °· Causes your heart to beat faster and your blood pressure to be higher. °· Increases the risk for constipation and bloating. °· Decreases the ability of wounds to heal. °· May result in depression, increased anxiety, and feelings of helplessness. °Relief of pain before surgery is also important because it will lessen the pain after surgery. Patients who receive both pain relief before and after surgery experience greater pain relief than those who only receive pain relief after surgery. Let your caregiver know if you are having uncontrolled pain. This is very important. Pain after surgery is more difficult to manage if it is permitted to become severe, so prompt and adequate treatment of acute pain is necessary. °PAIN CONTROL METHODS °Your caregivers follow policies and procedures about the management of patient pain. These guidelines should be explained to you before surgery. Plans for pain control after surgery must be mutually decided upon and instituted with your full understanding and agreement. Do not be afraid to ask questions regarding the care you are receiving. There are many different ways your caregivers will attempt to control your pain, including the following methods. °As needed pain control °· You may be given pain medicine either through your intravenous (IV) tube, or as a pill or  liquid you can swallow. You will need to let your caregiver know when you are having pain. Then, your caregiver will give you the pain medicine ordered for you. °· Your pain medicine may make you constipated. If constipation occurs, drink more liquids if you can. Your caregiver may have you take a mild laxative. °IV patient-controlled analgesia pump (PCA pump) °· You can get your pain medicine through the IV tube which goes into your vein. You are able to control the amount of pain medicine that you get. The pain medicine flows in through an IV tube and is controlled by a pump. This pump gives you a set amount of pain medicine when you push the button hooked up to it. Nobody should push this button but you or someone specifically assigned by you to do so. It is set up to keep you from accidentally giving yourself too much pain medicine. You will be able to start using your pain pump in the recovery room after your surgery. This method can be helpful for most types of surgery. °· If you are still having too much pain, tell your caregiver. Also, tell your caregiver if you are feeling too sleepy or nauseous. °Continuous epidural pain control °· A thin, soft tube (catheter) is put into your back. Pain medicine flows through the catheter to lessen pain in the part of your body where the surgery is done. Continuous epidural pain control may work best for you if you are having surgery on your chest, abdomen, hip area, or legs. The epidural catheter is usually put into your back just before surgery. The catheter is left in until you can eat and take medicine by mouth. In most cases,   this may take 2 to 3 days.  Giving pain medicine through the epidural catheter may help you heal faster because:  Your bowel gets back to normal faster.  You can get back to eating sooner.  You can be up and walking sooner. Medicine that numbs the area (local anesthetic)  You may receive an injection of pain medicine near where the  pain is (local infiltration).  You may receive an injection of pain medicine near the nerve that controls the sensation to a specific part of the body (peripheral nerve block).  Medicine may be put in the spine to block pain (spinal block). Opioids  Moderate to moderately severe acute pain after surgery may respond to opioids.Opioids are narcotic pain medicine. Opioids are often combined with non-narcotic medicines to improve pain relief, diminish the risk of side effects, and reduce the chance of addiction.  If you follow your caregiver's directions about taking opioids and you do not have a history of substance abuse, your risk of becoming addicted is exceptionally small.Opioids are given for short periods of time in careful doses to prevent addiction. Other methods of pain control include:  Steroids.  Physical therapy.  Heat and cold therapy.  Compression, such as wrapping an elastic bandage around the area of pain.  Massage. These various ways of controlling pain may be used together. Combining different methods of pain control is called multimodal analgesia. Using this approach has many benefits, including being able to eat, move around, and leave the hospital sooner. Document Released: 11/30/2002 Document Revised: 12/02/2011 Document Reviewed: 12/04/2010 [facility] Patient Information 2014 [place].

## 2013-11-28 NOTE — Lactation Note (Addendum)
This note was copied from the chart of Girl Rania Kuriakose. Lactation Consultation Note  Patient Name: Girl Syniyah Bourne CHYIF'O Date: 11/28/2013 Reason for consult: Follow-up assessment MD asked for a [facility] consult prior to decision regarding discharge . Per mom has been using a #20 NS on the left breast Due to soreness and has improved , but still sore and bruised. Latching on the right side with assist without the NS at 1st , and per mom painful  LC noted shallow latch and if depth obtained baby slides to the nipple . LC added #20 NS and the Baby latched right on and was able to get into a  Consistent pattern with a few swallows. LC noted scant am't colostrum in the nipple shield when baby released . Also noted the baby being non-nutritive  several times during the 8 min feeding. Discussed with mom the importance of calories and milk transfer, also the need for extra post pumping due to use of a  Nipple shield to establish and protect milk supply.    Maternal Data Has patient been taught Hand Expression?: Yes  Feeding Feeding Type: Breast Fed Length of feed: 8 min  LATCH Score/Interventions Latch: Grasps breast easily, tongue down, lips flanged, rhythmical sucking. (added a #20 NS after resizing ) Intervention(s): Skin to skin;Teach feeding cues;Waking techniques Intervention(s): Adjust position;Assist with latch;Breast massage;Breast compression  Audible Swallowing: A few with stimulation  Type of Nipple: Flat (more erectic with nipple shield )  Comfort (Breast/Nipple): Soft / non-tender     Hold (Positioning): Assistance needed to correctly position infant at breast and maintain latch. Intervention(s): Breastfeeding basics reviewed;Support Pillows;Position options;Skin to skin  LATCH Score: 7  Lactation Tools Discussed/Used Tools: Shells;Pump;Nipple Shields;Comfort gels Nipple shield size: 20 Shell Type: Inverted Breast pump type: Manual (Post pumping suggested due to use of  Nipple shield ) WIC Program: Yes Pump Review: Setup, frequency, and cleaning;Milk Storage Initiated by:: MAI  Date initiated:: 11/28/13   Consult Status Consult Status: Follow-up Date: 11/28/13 Follow-up type: In-patient    Myer Haff 11/28/2013, 2:07 PM

## 2013-11-29 ENCOUNTER — Ambulatory Visit: Payer: Self-pay

## 2013-11-29 NOTE — Lactation Note (Signed)
This note was copied from the chart of Rhonda Cambell Battaglia. Lactation Consultation Note: Mom reports that BF is going much better. Has been using the NS.Baby awake and sucking on a pacifier. Suggested nursing the baby. Has been wearing shells and reports they are helping. Able to latch baby without NS and mom reports some pain with initial latch but then eases off. Baby nursed on both breasts for 35 minutes total. Lots of swallows noted. No further questions at present. Wants [place] loaner for DC. Will follow up after Ped DC's baby.   Patient Name: Rhonda Graves [name] Date: 11/29/2013 Reason for consult: Follow-up assessment   Maternal Data    Feeding Feeding Type: Breast Fed Length of feed: 30 min  LATCH Score/Interventions Latch: Grasps breast easily, tongue down, lips flanged, rhythmical sucking.  Audible Swallowing: Spontaneous and intermittent  Type of Nipple: Everted at rest and after stimulation  Comfort (Breast/Nipple): Filling, red/small blisters or bruises, mild/mod discomfort  Problem noted: Mild/Moderate discomfort Interventions (Mild/moderate discomfort): Comfort gels  Hold (Positioning): Assistance needed to correctly position infant at breast and maintain latch. Intervention(s): Breastfeeding basics reviewed;Support Pillows  LATCH Score: 8  Lactation Tools Discussed/Used Tools: Shells Shell Type: Inverted WIC Program: Yes Pump Review: Setup, frequency, and cleaning   Consult Status Consult Status: Complete    Truddie Crumble 11/29/2013, 9:35 AM

## 2013-11-30 ENCOUNTER — Encounter (HOSPITAL_COMMUNITY): Payer: Self-pay | Admitting: Obstetrics & Gynecology

## 2013-11-30 ENCOUNTER — Inpatient Hospital Stay (HOSPITAL_COMMUNITY): Admission: RE | Admit: 2013-11-30 | Payer: Medicaid Other | Source: Ambulatory Visit

## 2013-12-01 NOTE — Progress Notes (Signed)
Post discharge chart review completed.  

## 2013-12-08 ENCOUNTER — Encounter (HOSPITAL_COMMUNITY)
Admission: RE | Admit: 2013-12-08 | Discharge: 2013-12-08 | Disposition: A | Discharge status: Home / Self Care | Payer: Medicaid Other | Source: Ambulatory Visit | Attending: Obstetrics & Gynecology | Admitting: Obstetrics & Gynecology

## 2013-12-08 DIAGNOSIS — O923 Agalactia: Secondary | ICD-10-CM | POA: Insufficient documentation

## 2013-12-08 NOTE — Lactation Note (Signed)
Adult Lactation Consultation Outpatient Visit Note   Baby was feeding with a NS for 3  Days after delivery related to SN.  Galilea attached to the bare breast but for the past 6-7 days mom's nipples are sore and there is trauma .  Her left nipple that has an open area and is filling with granulation tissue.  Today we were able to latch her to the bare breast using areolar compression and an off center latch.  Also mom pulled the baby into the breast from her shoulders to help her chin get closer.  She reported no pain with this technique.  When baby released the right nipple was not mis-shapen and the left nipple had a very small area of compression where the nipple and areola joined.  It was at the 3:00 position.  SHe ate briefly but quickly lost interest.  Mom stated that she had fed at 1230 . Applied the NS to see if increased stimulation would engage baby better as mom has short - shanked nipples.  This made no difference.  When baby detaches and changes position she starts to root again.  We supplemented her with 30 ml using the paced bottle feeding technique.  This satisfied her.  Baby is gaining weight well.   Recommended that mother :  Attach her to the breast at every feeding using the techniques taught . Supplement after BF is she is not satisfied. Pump 4-5 times a day May use a bottle 2 x a day if mom needs a break  Patient Name: Rhonda Graves Date of Birth: 1981/03/21 Gestational Age at Delivery: [redacted]w[redacted]d Type of Delivery:   Breastfeeding History: Frequency of Breastfeeding: BF 6-7 Length of Feeding: 20-30 min Voids: 6+ Stools: 3+  Supplementing / Method: Pumping:  Type of Pump:symphony   Frequency: 3-4 times a day  Volume:  3-4 oz  Comments:    Consultation Evaluation:  Initial Feeding Assessment: Pre-feed Weight:3305 Post-feed Weight: Amount Transferred: Comments:  Additional Feeding Assessment: Pre-feed Weight: Post-feed Weight: Amount  Transferred: Comments:  Additional Feeding Assessment: Pre-feed Weight: Post-feed Weight: Amount Transferred: Comments:  Total Breast milk Transferred this Visit:  Total Supplement Given:   Additional Interventions:   Follow-Up      Van Clines 12/08/2013, 2:58 PM

## 2013-12-13 ENCOUNTER — Ambulatory Visit: Payer: Medicaid Other | Admitting: Obstetrics and Gynecology

## 2013-12-20 ENCOUNTER — Encounter: Payer: Self-pay | Admitting: Obstetrics & Gynecology

## 2013-12-20 ENCOUNTER — Ambulatory Visit (INDEPENDENT_AMBULATORY_CARE_PROVIDER_SITE_OTHER): Payer: Medicaid Other | Admitting: Obstetrics & Gynecology

## 2013-12-20 VITALS — BP 169/110 | HR 81 | Temp 96.9°F | Ht 63.0 in | Wt 160.8 lb

## 2013-12-20 DIAGNOSIS — Z3009 Encounter for other general counseling and advice on contraception: Secondary | ICD-10-CM

## 2013-12-20 DIAGNOSIS — Z09 Encounter for follow-up examination after completed treatment for conditions other than malignant neoplasm: Secondary | ICD-10-CM

## 2013-12-20 MED ORDER — NORETHINDRONE 0.35 MG PO TABS: 1.0000 | ORAL_TABLET | Freq: Every day | ORAL | Status: DC

## 2013-12-20 NOTE — Patient Instructions (Signed)
Parto por cesárea - Cuidados posteriores  °(Cesarean Delivery, Care After) °Siga estas instrucciones durante las próximas semanas. Estas indicaciones le proporcionan información general acerca de cómo deberá cuidarse después del procedimiento. El médico también podrá darle instrucciones más específicas. El tratamiento se ha planificado de acuerdo a las prácticas médicas actuales, pero a veces se producen problemas. Comuníquese con el médico si tiene algún problema o tiene dudas cuando vuelva a su casa.  °INSTRUCCIONES PARA EL CUIDADO EN EL HOGAR  °· Tome sólo medicamentos de venta libre o recetados, según las indicaciones del médico. °· No beba alcohol, especialmente si está amamantando o toma analgésicos. °· Nomastique tabaco ni fume. °· Continúe con un adecuado cuidado perineal. El buen cuidado perineal incluye: °· Higienizarse de adelante hacia atrás. °· Mantener la zona perineal limpia. °· Controlar diariamente el corte (incisión) y observar si aumenta el enrojecimiento, si supura, se hincha o se separa la piel. °· Limpie la incisión suavemente con jabón y agua todos los días, y luego séquela dando golpecitos. Si el médico la autoriza, deje la incisión al descubierto. Use un apósito (vendaje) si drena líquido o la incisión parece irritada. Si las pequeñas tiras adhesivas que cruzan la incisión no se caen dentro de los 7 días, retírelas suavemente. °· Abrace una almohada al toser o estornudar hasta que la incisión se cure. Esto ayuda a aliviar el dolor. °· No conduzca vehículos ni opere maquinarias hasta que el médico la autorice. °· Dúchese, lávese el cabello y tome baños de inmersión según las indicaciones de su médico. °· Utilice un sostén que le ajuste bien y que brinde buen soporte a sus mamas. °· Limite el uso de bombachas de sostén o medias panty. °· Beba suficiente líquido para mantener la orina clara o de color amarillo pálido. °· Consuma todos los días alimentos ricos en fibra como cereales y panes  integrales, arroz, frijoles y frutas frescas y verduras. Estos alimentos pueden ayudarla a prevenir o aliviar el estreñimiento. °· Reanude las actividades como subir escaleras, conducir automóviles, levantar objetos pesados, hacer ejercicios o viajar cuando le indique su médico. °· Hable con su médico acerca de reanudar la actividad sexual. Volver a la actividad sexual depende del riesgo de infección, la velocidad de la curación y la comodidad y su deseo de reanudarla. °· Trate de que alguien la ayude con las actividades del hogar y con el recién nacido al menos durante algunos días después de salir del hospital. °· Descanse todo lo que pueda. Trate de descansar o tomar una siesta mientras el bebé está durmiendo. °· Aumente sus actividades gradualmente. °· Cumpla con todos los controles programados para después del parto. Es muy importante asistir a todas las visitas de control programadas. En estas visitas, su médico va a controlarla para asegurarse de que esté sanando física y emocionalmente. °SOLICITE ATENCIÓN MÉDICA SI:  °· Elimina coágulos grandes por la vagina. Guarde algunos coágulos para mostrarle al médico. °· Tiene una secreción con feo olor que proviene de la vagina. °· Tiene dificultad para orinar. °· Orina con frecuencia. °· Siente dolor al orinar. °· Nota un cambio en sus movimientos intestinales. °· Aumenta el enrojecimiento, el dolor o la hinchazón en la zona de la incisión. °· Observa que supura pus en la incisión. °· La incisión se abre. °· Sus mamas le duelen, están duras o enrojecidas. °· Sufre un dolor intenso de cabeza. °· Tiene visión borrosa o ve manchas. °· Se siente triste o deprimida. °· Tiene pensamientos acerca de lastimarse o dañar al   recién nacido. °· Tiene preguntas acerca de su cuidado, la atención del recién nacido o acerca de los medicamentos. °· Se siente mareada o sufre un desmayo. °· Tiene una erupción. °· Siente dolor u observa enrojecimiento o hinchazón en el sitio en que  estaba la vía intravenosa (IV). °· Tiene náuseas o vómitos. °· Usted dejó de amamantar al bebé y no ha tenido su período menstrual dentro de las 12 semanas siguientes. °· No amamanta al bebé y no tuvo su período menstrual en las últimas 12 semanas. °· Tiene fiebre. °SOLICITE ATENCIÓN MÉDICA DE INMEDIATO SI:  °· Siente dolor persistente. °· Siente dolor en el pecho. °· Le falta el aire. °· Se desmaya. °· Siente dolor en la pierna. °· Siente dolor en el estómago. °· El sangrado vaginal satura dos o más apósitos en 1 hora. °ASEGÚRESE DE QUE:  °· Comprende estas instrucciones. °· Controlará su enfermedad. °· Recibirá ayuda de inmediato si no mejora o si empeora. °Document Released: 09/09/2005 Document Revised: 05/12/2013 °ExitCare® Patient Information ©2014 [employer]. ° °

## 2013-12-20 NOTE — Progress Notes (Signed)
   Subjective:    Patient ID: Rhonda Graves, female    DOB: [birth year], 33 y.o.   MRN: [id number]  HPI Pt presents for 2 week post partum check and incision check.  She has a h/o depression in the past but, has not had any post partum depression. Had some drainage from her incision 2 days ago which has resolved.   Review of Systems     Objective:   Physical Exam BP 169/110  Pulse 81  Temp(Src) 96.9 F (36.1 C) (Oral)  Ht 5\' 3"  (1.6 m)  Wt 160 lb 12.8 oz (72.938 kg)  BMI 28.49 kg/m2 Abd: soft; NT, ND.  Incision clean, dry and intact        Assessment & Plan:  2 week incision check after a c-section No evidence of post partum depression F/u in 4 weeks or sooner prn

## 2013-12-20 NOTE — Progress Notes (Signed)
BP repeat 156/97

## 2014-01-20 ENCOUNTER — Ambulatory Visit (INDEPENDENT_AMBULATORY_CARE_PROVIDER_SITE_OTHER): Payer: Medicaid Other | Admitting: Obstetrics & Gynecology

## 2014-01-20 ENCOUNTER — Encounter: Payer: Self-pay | Admitting: Obstetrics & Gynecology

## 2014-01-20 VITALS — BP 154/98 | HR 57 | Temp 97.0°F | Ht 61.0 in | Wt 158.3 lb

## 2014-01-20 DIAGNOSIS — Z01812 Encounter for preprocedural laboratory examination: Secondary | ICD-10-CM

## 2014-01-20 DIAGNOSIS — Z309 Encounter for contraceptive management, unspecified: Secondary | ICD-10-CM

## 2014-01-20 DIAGNOSIS — IMO0001 Reserved for inherently not codable concepts without codable children: Secondary | ICD-10-CM

## 2014-01-20 DIAGNOSIS — R030 Elevated blood-pressure reading, without diagnosis of hypertension: Secondary | ICD-10-CM

## 2014-01-20 LAB — POCT PREGNANCY, URINE: Preg Test, Ur: NEGATIVE

## 2014-01-20 MED ORDER — HYDROCHLOROTHIAZIDE 25 MG PO TABS: 25.0000 mg | ORAL_TABLET | Freq: Every day | ORAL | Status: DC

## 2014-01-20 MED ORDER — ETONOGESTREL-ETHINYL ESTRADIOL 0.12-0.015 MG/24HR VA RING: VAGINAL_RING | VAGINAL | Status: DC

## 2014-01-20 NOTE — Progress Notes (Signed)
Pt. Here today for PP. Would like to get nuva ring. Has been taking micronor. Pt. Requests pregnancy test as she admits to having sexual intercourse unprotected prior to being put on micronor but had not admitted to it day of last visit. Also c/o of having elevated BP; states she has had frequent headaches and bought a BP machine a few weeks ago to check pressures due to headaches and states BP has been borderline or hypertensive.

## 2014-01-20 NOTE — Patient Instructions (Signed)
Ethinyl Estradiol; Etonogestrel vaginal ring Qu es este medicamento? El ETINIL ESTRADIOL; ETONOGESTREL es un anillo vaginal flexible utilizado como un anticonceptivo (control de natalidad). Este producto [employer] tipos de hormonas femeninas, un estrgeno y Ardelia Mems progestina. Este anillo se [place] para Mining engineer ovulacin y Water quality scientist. Cada anillo es efectivo durante un mes. Este medicamento puede ser utilizado para otros usos; si tiene alguna pregunta consulte con su proveedor de atencin mdica o con su farmacutico. MARCAS COMERCIALES DISPONIBLES: NuvaRing Qu le debo informar a mi profesional de la salud antes de tomar este medicamento? Necesita saber si usted presenta alguno de los siguientes problemas o situaciones: -sangrado vaginal anormal -enfermedad vascular o cogulos sanguneos -cncer de mama, cervical, endometrio, ovario, hgado o uterino -diabetes -enfermedad de la vescula biliar -enfermedad cardiaca o ataque cardiaco reciente -alta presin sangunea -niveles elevados de colesterol -enfermedad renal -enfermedad heptica -migraa -derrame cerebral -lupus eritematoso sistmico (LES) -fumador -una reaccin alrgica o inusual a los estrgenos, progestinas, otros medicamentos, alimentos, colorantes o conservantes -si est embarazada o buscando quedar embarazada -si est amamantando a un beb Cmo debo utilizar este medicamento? Introduzca el anillo en su vagina, segn las indicaciones. Siga las instrucciones de la etiqueta del [place]. El anillo vaginal Futures trader en su lugar durante 3 semanas y luego debe retirar el anillo por 1 semana de descanso. Debe introducir un anillo nuevo 1 semana despus de retirar el ltimo anillo, en el mismo da de la [place]. No use su medicamento con una frecuencia mayor a la indicada. Usted recibir un prospecto para el paciente para este producto con cada receta y relleno. Asegrese de leer este folleto cada vez cuidadosamente. Este  folleto puede cambiar con frecuencia. Hable con su pediatra para informarse acerca del uso de este medicamento en nios. Puede requerir atencin especial. Este medicamentos ha sido usado en nias que han empezados a tener perodos [place]. Sobredosis: Pngase en contacto inmediatamente con un centro toxicolgico o una sala de urgencia si usted cree que haya tomado demasiado medicamento. ATENCIN: [employer] es solo para usted. No comparta este medicamento con nadie. Qu sucede si me olvido de una dosis? Generalmente, slo necesitar reemplazar su anillo vaginal una vez por mes. Pregunte a su profesional de la salud qu hacer si deja su anillo vaginal colocado durante un perodo de tiempo mayor o menor del que se supone o si se Diplomatic Services operational officer. Qu puede interactuar con este medicamento? -acetaminofeno -antibiticos o medicamentos para infecciones, especialmente rifampicina, rifabutina, rifapentina y griseofulvina y posiblemente penicilina o tetraciclina -aprepitant -cido ascrbico (vitamina C) -atorvastatina -barbitricos, como fenobarbital -bosentano -carbamazepina -cafena -clofibrato -ciclosporina -dantroleno -doxercalciferol -felbamato -jugo de toronja -hidrocortisona -medicamentos para la ansiedad o para los problemas para conciliar el sueo, como diazepam o temazepam -medicamentos para la diabetes como pioglitazona -modafinilo -micofenolato -nefazodona -oxcarbazepina -fenitona -prednisolona -ritonavir u otros medicamentos para tratar el virus VIH o el SIDA -rosuvastatina -selegilina -suplementos de isoflavonas de soya -hierba de [place] ser que esta lista no menciona todas las posibles interacciones. Informe a su profesional de [employer]	[place] de [employer] productos a base de hierbas, medicamentos de [place] o suplementos nutritivos que est tomando. Si usted fuma, consume  bebidas alcohlicas o si utiliza drogas ilegales, indqueselo tambin a su profesional de [employer]	[place]. Algunas sustancias pueden interactuar con su medicamento. A qu debo estar atento al usar [employer]? Visite a su mdico o su profesional de la salud para chequear su evolucin peridicamente.  Deber hacerse exmenes de las mamas y la pelvis y exmenes de Papanicolaou en forma regular mientras est usando este medicamento. Use un mtodo anticonceptivo Paediatric nurse ciclo que est usando este anillo. Si tiene algn motivo para pensar que est embarazada, deje de usar este medicamento de inmediato y comunquese con su mdico o con su profesional de Technical sales engineer. Si toma este medicamento para problemas relacionados con las hormonas, pueden pasar varios ciclos hasta que observe mejoras en su afeccin. El fumar aumenta el riesgo de formacin de cogulos o de sufrir un derrame cerebral mientras [place] anticonceptivos hormonales, especialmente si tiene ms de 35 aos. Se le recomienda enfticamente que no fume. Este medicamento puede hacer que su cuerpo retenga lquido, lo que puede provocar que se le hinchen los dedos, manos o tobillos. Su presin sangunea Regulatory affairs officer. Comunquese con su mdico o con su profesional de la salud si siente que est reteniendo lquido. Este medicamento puede aumentar la sensibilidad al sol. Mantngase fuera de Administrator. Si no lo puede evitar, utilice ropa protectora y crema de Photographer. No utilice lmparas solares, camas solares ni cabinas solares. Si [place] lentes de contacto y observa cambios en la visin, o si los lentes comienzan a resultarle incmodos, consulte al especialista en ojos. Algunas mujeres pueden presentar sensibilidad, hinchazn o sangrado leve de las encas. Informe a su dentista si esto sucede. Cepillarse los dientes y limpiarlos con hilo dental peridicamente puede ayudar a controlar este fenmeno. Visite peridicamente a su dentista e  infrmele acerca de los medicamentos que est tomando. Si va a someterse a Brewing technologist, tal vez deba dejar de usar este medicamento de antemano. Consulte con su profesional de la salud por asesorimiento. Este medicamento no la protegen de la infeccin por VIH (SIDA) ni de ninguna otra enfermedad de transmisin sexual. Qu efectos secundarios puedo tener al utilizar este medicamento? Efectos secundarios que debe informar a su mdico o a Barrister's clerk de la salud tan pronto como sea posible: -secreciones o cambios en el tejido de las mamas -cambios en el sangrado vaginal durante el perodo menstrual o entre perodos menstruales -dolor en el pecho -tos con sangre -mareos o desmayos -dolores de cabeza o migraas -dolores de cabeza severos o repentinos -Administrator (severo) -falta de aliento repentina -falta de coordinacin repentina, especialmente en un lado del cuerpo -problemas del habla -sntomas de infeccin vaginal como picazn, irritacin o flujo inusual -sensibilidad en la parte superior del abdomen -vmito -debilidad o entumecimiento de los brazos o piernas, especialmente de un lado del cuerpo -color amarillento de los ojos o la piel Efectos secundarios que, por lo general, no requieren atencin mdica (debe informarlos a su mdico o a su profesional de la salud si persisten o si son molestos): -sangrado o manchas inesperadas que continan despus de los 3 primeros ciclos de pldoras -sensibilidad en las mamas -cambios de estados de nimo, ansiedad, depresin, frustracin, ira o exaltacin -aumento de la sensibilidad al sol o a la luz ultravioleta -nuseas -erupcin cutnea, acn o manchas marrones en la piel -aumento de peso (leve) Puede ser que esta lista no menciona todos los posibles efectos secundarios. Comunquese a su mdico por asesoramiento mdico [employer]. Usted puede informar los efectos secundarios a la FDA por telfono al  1-800-FDA-1088. Dnde debo guardar mi medicina? Mantngala fuera del alcance de los nios. Gurdela a [employer], entre 15 y 68 grados C (25 y 52 grados F) durante hasta 4 meses. Este producto  se expirara despus de 4 meses. Protjala de la luz. Deseche todos los medicamentos que no haya utilizado, despus de la fecha de vencimiento. ATENCIN: Este folleto es un resumen. Puede ser que no cubra toda la posible informacin. Si usted tiene preguntas acerca de esta medicina, consulte con su mdico, su farmacutico o su profesional de Technical sales engineer.  2014, Elsevier/Gold Standard. (2008-10-28 14:19:15)

## 2014-01-20 NOTE — Progress Notes (Signed)
Patient ID: Rhonda Graves, female   DOB: 1980-10-02, 33 y.o.   MRN: [id number] Subjective:     Rhonda Graves is a 33 y.o. female who presents for a postpartum visit. She is 6 weeks postpartum following a low cervical transverse Cesarean section. I have fully reviewed the prenatal and intrapartum course. The delivery was at 40 3/7 gestational weeks. Outcome: primary cesarean section, low transverse incision. Anesthesia: epidural. Postpartum course has been uncomplicated. Baby's course has been uncomplicated. Baby is feeding by breast and bottle. Bleeding no bleeding. Bowel function is normal. Bladder function is normal. Patient is sexually active. Contraception method is oral progesterone-only contraceptive. Postpartum depression screening: negative.  The following portions of the patient's history were reviewed and updated as appropriate: allergies, current medications, past family history, past medical history, past social history, past surgical history and problem list.  Review of Systems Pertinent items are noted in HPI.  Blood pressure elevated on postpartum visit   Objective:    BP 154/98  Pulse 57  Temp(Src) 97 F (36.1 C) (Oral)  Ht 5\' 1"  (1.549 m)  Wt 158 lb 4.8 oz (71.804 kg)  BMI 29.93 kg/m2  Breastfeeding? Yes  General:  alert and no distress           Abdomen: soft, non-tender; bowel sounds normal; no masses,  no organomegaly and incision clean, dry and intact                           Assessment:     6 weeks postpartum exam. Pap smear not done at today's visit.  Gestational HTN (postpartum)  Plan:    1. Contraception: NuvaRing vaginal inserts 2. HCTZ 25 mg q day 3. Follow up in: 3 months or as needed.

## 2014-02-13 ENCOUNTER — Inpatient Hospital Stay (HOSPITAL_COMMUNITY)
Admission: AD | Admit: 2014-02-13 | Discharge: 2014-02-13 | Disposition: A | Discharge status: Home / Self Care | Payer: Medicaid Other | Source: Ambulatory Visit | Attending: Obstetrics & Gynecology | Admitting: Obstetrics & Gynecology

## 2014-02-13 ENCOUNTER — Encounter (HOSPITAL_COMMUNITY): Payer: Self-pay | Admitting: *Deleted

## 2014-02-13 DIAGNOSIS — R030 Elevated blood-pressure reading, without diagnosis of hypertension: Secondary | ICD-10-CM | POA: Insufficient documentation

## 2014-02-13 DIAGNOSIS — O9989 Other specified diseases and conditions complicating pregnancy, childbirth and the puerperium: Principal | ICD-10-CM

## 2014-02-13 DIAGNOSIS — K589 Irritable bowel syndrome without diarrhea: Secondary | ICD-10-CM | POA: Insufficient documentation

## 2014-02-13 DIAGNOSIS — K219 Gastro-esophageal reflux disease without esophagitis: Secondary | ICD-10-CM | POA: Insufficient documentation

## 2014-02-13 DIAGNOSIS — R51 Headache: Secondary | ICD-10-CM | POA: Insufficient documentation

## 2014-02-13 DIAGNOSIS — R519 Headache, unspecified: Secondary | ICD-10-CM

## 2014-02-13 DIAGNOSIS — O99893 Other specified diseases and conditions complicating puerperium: Secondary | ICD-10-CM | POA: Insufficient documentation

## 2014-02-13 DIAGNOSIS — IMO0001 Reserved for inherently not codable concepts without codable children: Secondary | ICD-10-CM

## 2014-02-13 LAB — URINALYSIS, ROUTINE W REFLEX MICROSCOPIC
BILIRUBIN URINE: NEGATIVE
Glucose, UA: NEGATIVE mg/dL
KETONES UR: NEGATIVE mg/dL
Leukocytes, UA: NEGATIVE
Nitrite: NEGATIVE
PROTEIN: NEGATIVE mg/dL
Specific Gravity, Urine: 1.025 (ref 1.005–1.030)
Urobilinogen, UA: 0.2 mg/dL (ref 0.0–1.0)
pH: 6 (ref 5.0–8.0)

## 2014-02-13 LAB — URINE MICROSCOPIC-ADD ON

## 2014-02-13 LAB — PROTEIN / CREATININE RATIO, URINE
Creatinine, Urine: 59.85 mg/dL
Protein Creatinine Ratio: 0.07 (ref 0.00–0.15)
Total Protein, Urine: 4.1 mg/dL

## 2014-02-13 MED ORDER — AMLODIPINE BESYLATE 2.5 MG PO TABS: 2.5000 mg | ORAL_TABLET | Freq: Every day | ORAL | Status: DC

## 2014-02-13 NOTE — MAU Note (Signed)
Patient presents with complaint of postpartum hypertension following her delivery on March 6th.

## 2014-02-13 NOTE — Discharge Instructions (Signed)

## 2014-02-13 NOTE — MAU Provider Note (Signed)
Pt well known to me.  Attestation of Attending Supervision of Advanced Practitioner (CNM/NP): Evaluation and management procedures were performed by the Advanced Practitioner under my supervision and collaboration.  I have reviewed the Advanced Practitioner's note and chart, and I agree with the management and plan.  Lavonia Drafts 5:55 PM

## 2014-02-13 NOTE — MAU Provider Note (Signed)
History     CSN: [id number]  Arrival date and time: 02/13/14 1209   First Provider Initiated Contact with Patient 02/13/14 1309      Chief Complaint  Patient presents with  . Hypertension   Hypertension Associated symptoms include headaches. Pertinent negatives include no blurred vision.    Patient presents today for elevated BP and HA. She is G1P1001 s/p PLTCS indicated by polyhydramnios.  Delivered on March 6. She reports having elevated blood pressures at home that range into the 150's/100's.  This is in despite being started on HCTZ 25 mg daily. Prior to pregnancy her blood pressure was within normal limits and didn't take any medications. She has no family history of HTN. She has associated headache with her elevate blood pressure. She had a headache daily since delivery. At its worse the pain is an 8/10 and a 2/10 at its best. She took ibuprofen 800 mg daily for three weeks postpartum. She took ibuprofen intermittently until a week ago when she started taking it daily again for the pain. She denies any vision changes, RUQ pain, increased swelling in her legs or changes on urination. She has a history of migraines prior to pregnancy for which she took relpax. She could have 1-2 migraines weekly at the worse or 2 monthly.  She didn't have any migraines during pregnancy.   Past Medical History  Diagnosis Date  . Headache(784.0)     migraine  . IBS (irritable bowel syndrome)   . Multiple allergies   . GERD (gastroesophageal reflux disease)   . Abnormal Pap smear     had colpo in past, normal pap since then    Past Surgical History  Procedure Laterality Date  . Cesarean section N/A 11/26/2013    Procedure: CESAREAN SECTION;  Surgeon: Lavonia Drafts, MD;  Location: [facility];  Service: Obstetrics;  Laterality: N/A;    Family History  Problem Relation Age of Onset  . Arthritis Mother   . Hypothyroidism Mother   . Asthma Brother     History  Substance Use Topics  .  Smoking status: Never Smoker   . Smokeless tobacco: Never Used  . Alcohol Use: Yes     Comment: Not currently drinking    Allergies:  Allergies  Allergen Reactions  . Other Anaphylaxis    Walnuts  . 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 Peanut-Containing Drug Products Hives    No prescriptions prior to admission    Review of Systems  Constitutional: Negative for fever.  Eyes: Negative for blurred vision and photophobia.  Skin: Negative for rash.  Neurological: Positive for headaches.   Physical Exam   Blood pressure 158/101, pulse 61, temperature 98.4 F (36.9 C), temperature source Oral, resp. rate 18, height 5\' 1"  (1.549 m), weight 70.761 kg (156 lb), currently breastfeeding.  Physical Exam  Constitutional: She appears well-developed and well-nourished. No distress.  HENT:  Head: Normocephalic and atraumatic.  Eyes: EOM are normal. Pupils are equal, round, and reactive to light.  Cardiovascular: Normal heart sounds.  Bradycardia present.   No murmur heard. Respiratory: Effort normal and breath sounds normal.  GI: Soft. She exhibits no distension.  Musculoskeletal: Normal range of motion.  Neurological: She is alert. She has normal reflexes.    Urinalysis    Component Value Date/Time   COLORURINE YELLOW 02/13/2014 1220   APPEARANCEUR CLEAR 02/13/2014 1220   LABSPEC 1.025 02/13/2014 1220   PHURINE 6.0 02/13/2014 1220   GLUCOSEU NEGATIVE 02/13/2014 1220   HGBUR TRACE* 02/13/2014 1220  BILIRUBINUR NEGATIVE 02/13/2014 1220   KETONESUR NEGATIVE 02/13/2014 1220   PROTEINUR NEGATIVE 02/13/2014 1220   UROBILINOGEN 0.2 02/13/2014 1220   NITRITE NEGATIVE 02/13/2014 [place] 02/13/2014 1220   Results for KALLY, CADDEN (MRN [id number]) as of 02/13/2014 15:29  Ref. Range 02/13/2014 12:50  Total Protein, Urine No range found 4.1  PROTEIN CREATININE RATIO Latest Range: 0.00-0.15  0.07  Creatinine, Urine No range found 59.85    MAU Course   Procedures  MDM Patient is G1P1001 s/p PLTCS indicated by polyhydramnios with delivery on March 6. UA and pr/cr ratio. 15 minute BP checks.   1515: patient reevaluated with elevated blood pressure. Labs are reassuring with normal UA.   Discussed with Manya Silvas, CNM and Dr. Ihor Dow   Assessment and Plan  #Elevated BP: Pr/cr within normal limits. Patient with extended use of ibuprofen which could contribute to elevated BP. Currently treated with HCTZ. Possible for having initial diagnosis of HTN.   - Amlodipine 2.5 mg daily started  - f/u this week  - stop all ibuprofen use   #HA: hx of migraines prior to pregnancy. Continued ibuprofen use postpartum. Possible for rebound headache or chronic headache but doubt any form of preeclampsia with duration from delivery and normal labs today. No gross deficits on exam. No signs of infection.  - stop NSAID use  - f/u with Monna Fam in [place].   Rosemarie Ax 02/13/2014, 3:29 PM   I have seen and examined this patient and agree the above assessment. Christin Fudge 02/13/2014 4:39 PM

## 2014-02-18 ENCOUNTER — Encounter: Payer: Self-pay | Admitting: Family Medicine

## 2014-02-18 ENCOUNTER — Ambulatory Visit (INDEPENDENT_AMBULATORY_CARE_PROVIDER_SITE_OTHER): Payer: Medicaid Other | Admitting: Family Medicine

## 2014-02-18 VITALS — BP 143/105 | HR 63 | Ht 61.0 in | Wt 154.2 lb

## 2014-02-18 DIAGNOSIS — G43909 Migraine, unspecified, not intractable, without status migrainosus: Secondary | ICD-10-CM

## 2014-02-18 DIAGNOSIS — O10919 Unspecified pre-existing hypertension complicating pregnancy, unspecified trimester: Secondary | ICD-10-CM | POA: Insufficient documentation

## 2014-02-18 DIAGNOSIS — I10 Essential (primary) hypertension: Secondary | ICD-10-CM

## 2014-02-18 HISTORY — DX: Unspecified pre-existing hypertension complicating pregnancy, unspecified trimester: O10.919

## 2014-02-18 MED ORDER — ELETRIPTAN HYDROBROMIDE 40 MG PO TABS: 40.0000 mg | ORAL_TABLET | ORAL | Status: DC | PRN

## 2014-02-18 MED ORDER — AMLODIPINE BESYLATE 5 MG PO TABS: 5.0000 mg | ORAL_TABLET | Freq: Every day | ORAL | Status: DC

## 2014-02-18 NOTE — Progress Notes (Signed)
    Subjective:    Patient ID: Rhonda Graves is a 33 y.o. female presenting with Hypertension  on 02/18/2014  HPI: 10 wks pp from primary cesarean section for arrest.  Developed elevated BP over the first 2 wks pp.  She then, was started on HCTZ.  BP has not improved.  Went to MAU on 5/24 and BP in the 150-160/110 range.  Started on Norvasc 2.5 mg.   She continues to breast feed.   She is on Teacher, early years/pre for birth control. She takes an occasional Ibuprofen for headache.  Has h/o migraines and has needed Relpax in the past and needs rx refill.  Told the ibuprofen was making her BP so elevated.   Review of Systems  Constitutional: Negative for fever and chills.  Respiratory: Negative for shortness of breath.   Cardiovascular: Negative for chest pain.  Gastrointestinal: Negative for nausea, vomiting and abdominal pain.  Genitourinary: Negative for dysuria.  Skin: Negative for rash.  Neurological: Positive for headaches.      Objective:    BP 143/105  Pulse 63  Ht 5\' 1"  (1.549 m)  Wt 154 lb 3.2 oz (69.945 kg)  BMI 29.15 kg/m2  LMP 02/15/2014  Breastfeeding? Yes Physical Exam  Constitutional: She appears well-developed and well-nourished. No distress.  HENT:  Head: Normocephalic and atraumatic.  Cardiovascular: Normal rate.   Pulmonary/Chest: Effort normal.  Abdominal: Soft.        Assessment & Plan:   Essential hypertension, benign Given that she is almost 3 months pp--may be a permanent BP issue.  Nuva Ring is probably not helping.  She will consider this.  Will increase her Norvasc to 5mg .  If no improvement over next 1-2 wks, may increase further to 10 mg. If BP consistently in the 110-120/70-80, can peel back BP meds slowly.  Will need primary MD.    Return in about 3 months (around 05/21/2014) for a follow-up.

## 2014-02-18 NOTE — Patient Instructions (Signed)

## 2014-02-18 NOTE — Assessment & Plan Note (Signed)
Given that she is almost 3 months pp--may be a permanent BP issue.  Nuva Ring is probably not helping.  She will consider this.  Will increase her Norvasc to 5mg .  If no improvement over next 1-2 wks, may increase further to 10 mg. If BP consistently in the 110-120/70-80, can peel back BP meds slowly.  Will need primary MD.

## 2014-02-21 ENCOUNTER — Other Ambulatory Visit: Payer: Medicaid Other

## 2014-02-21 DIAGNOSIS — I10 Essential (primary) hypertension: Secondary | ICD-10-CM

## 2014-02-21 LAB — COMPREHENSIVE METABOLIC PANEL
ALBUMIN: 4.4 g/dL (ref 3.5–5.2)
ALK PHOS: 100 U/L (ref 39–117)
ALT: 22 U/L (ref 0–35)
AST: 27 U/L (ref 0–37)
BUN: 14 mg/dL (ref 6–23)
CO2: 26 mEq/L (ref 19–32)
Calcium: 10 mg/dL (ref 8.4–10.5)
Chloride: 98 mEq/L (ref 96–112)
Creat: 0.91 mg/dL (ref 0.50–1.10)
Glucose, Bld: 88 mg/dL (ref 70–99)
POTASSIUM: 3.5 meq/L (ref 3.5–5.3)
SODIUM: 135 meq/L (ref 135–145)
TOTAL PROTEIN: 7.3 g/dL (ref 6.0–8.3)
Total Bilirubin: 0.4 mg/dL (ref 0.2–1.2)

## 2014-03-09 ENCOUNTER — Telehealth: Payer: Self-pay

## 2014-03-09 ENCOUNTER — Encounter: Payer: Self-pay | Admitting: Family Medicine

## 2014-03-09 NOTE — Telephone Encounter (Signed)
Rhonda Graves came in to clinic-- informed her of Dr. Virginia Crews recommendation and advised that she obtain appointment with PCP so that they may continue to monitor and manage BP and HTN. Patient verbalized understanding and stated she would get an appointment. No further questions or concerns.

## 2014-03-09 NOTE — Telephone Encounter (Signed)
Alitza sent message "Hello! I'm Rhonda Graves,the spanish interpreter at the clinic.My BP is still between 130-140's over 90's during the day. Only a couple of times has been 122/80 either early in the morning or very late at night. Do I need to have my dose adjusted? And also, can I have the HCTZ switch for something else? I feel is really affecting my breastmilk supply. If not, it's ok. Thank you very much" Discussed with Dr. Kennon Rounds who stated patient could increase Norvasc to 10mg  from 5mg  if she has not done so already and needs to see PCP for further management.

## 2014-03-28 ENCOUNTER — Ambulatory Visit: Payer: Self-pay | Admitting: Obstetrics & Gynecology

## 2014-04-15 ENCOUNTER — Other Ambulatory Visit (HOSPITAL_COMMUNITY)
Admission: RE | Admit: 2014-04-15 | Discharge: 2014-04-15 | Disposition: A | Discharge status: Home / Self Care | Payer: Medicaid Other | Source: Ambulatory Visit | Attending: Obstetrics & Gynecology | Admitting: Obstetrics & Gynecology

## 2014-04-15 ENCOUNTER — Encounter: Payer: Self-pay | Admitting: Obstetrics & Gynecology

## 2014-04-15 ENCOUNTER — Ambulatory Visit (INDEPENDENT_AMBULATORY_CARE_PROVIDER_SITE_OTHER): Payer: Medicaid Other | Admitting: Obstetrics & Gynecology

## 2014-04-15 VITALS — BP 138/98 | HR 62 | Ht 61.0 in | Wt 153.0 lb

## 2014-04-15 DIAGNOSIS — Z1151 Encounter for screening for human papillomavirus (HPV): Secondary | ICD-10-CM | POA: Insufficient documentation

## 2014-04-15 DIAGNOSIS — Z01419 Encounter for gynecological examination (general) (routine) without abnormal findings: Secondary | ICD-10-CM

## 2014-04-15 DIAGNOSIS — Z124 Encounter for screening for malignant neoplasm of cervix: Secondary | ICD-10-CM | POA: Insufficient documentation

## 2014-04-15 DIAGNOSIS — R030 Elevated blood-pressure reading, without diagnosis of hypertension: Secondary | ICD-10-CM

## 2014-04-15 DIAGNOSIS — IMO0001 Reserved for inherently not codable concepts without codable children: Secondary | ICD-10-CM

## 2014-04-15 DIAGNOSIS — I10 Essential (primary) hypertension: Secondary | ICD-10-CM

## 2014-04-15 MED ORDER — HYDROCHLOROTHIAZIDE 25 MG PO TABS: 25.0000 mg | ORAL_TABLET | Freq: Every day | ORAL | Status: DC

## 2014-04-15 MED ORDER — AMLODIPINE BESYLATE 5 MG PO TABS: 5.0000 mg | ORAL_TABLET | Freq: Every day | ORAL | Status: DC

## 2014-04-15 NOTE — Progress Notes (Signed)
    GYNECOLOGY CLINIC ANNUAL PREVENTATIVE CARE ENCOUNTER NOTE  Subjective:     Rhonda Graves is a 33 y.o. G62P1001 female here for a routine annual gynecologic exam.  Current complaints:some numbness around cesarean section incision, had procedure 4 months ago. Also needs refill for HTN medications.    Gynecologic History Patient's last menstrual period was 04/12/2014. Contraception: NuvaRing vaginal inserts Last Pap: 04/08/13. Results were: normal with negative HRHPV   Obstetric History OB History  Gravida Para Term Preterm AB SAB TAB Ectopic Multiple Living  1 1 1       1     # Outcome Date GA Lbr Len/2nd Weight Sex Delivery Anes PTL Lv  1 TRM 11/26/13 [redacted]w[redacted]d  7 lb 4.8 oz (3.31 kg) F CS-Vac EPI  Y     The following portions of the patient's history were reviewed and updated as appropriate: allergies, current medications, past family history, past medical history, past social history, past surgical history and problem list.  Review of Systems Pertinent items are noted in HPI.    Objective:   BP 138/98  Pulse 62  Ht 5\' 1"  (1.549 m)  Wt 153 lb (69.4 kg)  BMI 28.92 kg/m2  LMP 04/12/2014  Breastfeeding? Yes GENERAL: Well-developed, well-nourished female in no acute distress.  HEENT: Normocephalic, atraumatic. Sclerae anicteric.  NECK: Supple. Normal thyroid.  LUNGS: Clear to auscultation bilaterally.  HEART: Regular rate and rhythm. BREASTS: Symmetric in size. No masses, skin changes, nipple drainage, or lymphadenopathy. ABDOMEN: Soft, nontender, nondistended. No organomegaly. Well-healed Pfannenstiel incision PELVIC: Normal external female genitalia. Vagina is pink and rugated.  Normal discharge. Normal cervix contour. Pap smear obtained. Uterus is normal in size. No adnexal mass or tenderness.  EXTREMITIES: No cyanosis, clubbing, or edema, 2+ distal pulses.   Assessment:   Annual gynecologic examination   Plan:   Pap done, will follow up results and manage  accordingly Reassured about cesarean section scar symptoms; antihypertensives refilled. Will follow up with PCP for further evaluation and management. Routine preventative health maintenance measures emphasized   Verita Schneiders, MD, [place] [employer] Attending [place] [facility]

## 2014-04-15 NOTE — Patient Instructions (Signed)
Preventive Care for Adults A healthy lifestyle and preventive care can promote health and wellness. Preventive health guidelines for women include the following key practices.  A routine yearly physical is a good way to check with your health care provider about your health and preventive screening. It is a chance to share any concerns and updates on your health and to receive a thorough exam.  Visit your dentist for a routine exam and preventive care every 6 months. Brush your teeth twice a day and floss once a day. Good oral hygiene prevents tooth decay and gum disease.  The frequency of eye exams is based on your age, health, family medical history, use of contact lenses, and other factors. Follow your health care provider's recommendations for frequency of eye exams.  Eat a healthy diet. Foods like vegetables, fruits, whole grains, low-fat dairy products, and lean protein foods contain the nutrients you need without too many calories. Decrease your intake of foods high in solid fats, added sugars, and salt. Eat the right amount of calories for you.Get information about a proper diet from your health care provider, if necessary.  Regular physical exercise is one of the most important things you can do for your health. Most adults should get at least 150 minutes of moderate-intensity exercise (any activity that increases your heart rate and causes you to sweat) each week. In addition, most adults need muscle-strengthening exercises on 2 or more days a week.  Maintain a healthy weight. The body mass index (BMI) is a screening tool to identify possible weight problems. It provides an estimate of body fat based on height and weight. Your health care provider can find your BMI and can help you achieve or maintain a healthy weight.For adults 20 years and older:  A BMI below 18.5 is considered underweight.  A BMI of 18.5 to 24.9 is normal.  A BMI of 25 to 29.9 is considered overweight.  A BMI of  30 and above is considered obese.  Maintain normal blood lipids and cholesterol levels by exercising and minimizing your intake of saturated fat. Eat a balanced diet with plenty of fruit and vegetables. Blood tests for lipids and cholesterol should begin at age 76 and be repeated every 5 years. If your lipid or cholesterol levels are high, you are over 50, or you are at high risk for heart disease, you may need your cholesterol levels checked more frequently.Ongoing high lipid and cholesterol levels should be treated with medicines if diet and exercise are not working.  If you smoke, find out from your health care provider how to quit. If you do not use tobacco, do not start.  Lung cancer screening is recommended for adults aged 22-80 years who are at high risk for developing lung cancer because of a history of smoking. A yearly low-dose CT scan of the lungs is recommended for people who have at least a 30-pack-year history of smoking and are a current smoker or have quit within the past 15 years. A pack year of smoking is smoking an average of 1 pack of cigarettes a day for 1 year (for example: 1 pack a day for 30 years or 2 packs a day for 15 years). Yearly screening should continue until the smoker has stopped smoking for at least 15 years. Yearly screening should be stopped for people who develop a health problem that would prevent them from having lung cancer treatment.  If you are pregnant, do not drink alcohol. If you are breastfeeding,  be very cautious about drinking alcohol. If you are not pregnant and choose to drink alcohol, do not have more than 1 drink per day. One drink is considered to be 12 ounces (355 mL) of beer, 5 ounces (148 mL) of wine, or 1.5 ounces (44 mL) of liquor.  Avoid use of street drugs. Do not share needles with anyone. Ask for help if you need support or instructions about stopping the use of drugs.  High blood pressure causes heart disease and increases the risk of  stroke. Your blood pressure should be checked at least every 1 to 2 years. Ongoing high blood pressure should be treated with medicines if weight loss and exercise do not work.  If you are 75-52 years old, ask your health care provider if you should take aspirin to prevent strokes.  Diabetes screening involves taking a blood sample to check your fasting blood sugar level. This should be done once every 3 years, after age 15, if you are within normal weight and without risk factors for diabetes. Testing should be considered at a younger age or be carried out more frequently if you are overweight and have at least 1 risk factor for diabetes.  Breast cancer screening is essential preventive care for women. You should practice "breast self-awareness." This means understanding the normal appearance and feel of your breasts and may include breast self-examination. Any changes detected, no matter how small, should be reported to a health care provider. Women in their 58s and 30s should have a clinical breast exam (CBE) by a health care provider as part of a regular health exam every 1 to 3 years. After age 16, women should have a CBE every year. Starting at age 53, women should consider having a mammogram (breast X-ray test) every year. Women who have a family history of breast cancer should talk to their health care provider about genetic screening. Women at a high risk of breast cancer should talk to their health care providers about having an MRI and a mammogram every year.  Breast cancer gene (BRCA)-related cancer risk assessment is recommended for women who have family members with BRCA-related cancers. BRCA-related cancers include breast, ovarian, tubal, and peritoneal cancers. Having family members with these cancers may be associated with an increased risk for harmful changes (mutations) in the breast cancer genes BRCA1 and BRCA2. Results of the assessment will determine the need for genetic counseling and  BRCA1 and BRCA2 testing.  Routine pelvic exams to screen for cancer are no longer recommended for nonpregnant women who are considered low risk for cancer of the pelvic organs (ovaries, uterus, and vagina) and who do not have symptoms. Ask your health care provider if a screening pelvic exam is right for you.  If you have had past treatment for cervical cancer or a condition that could lead to cancer, you need Pap tests and screening for cancer for at least 20 years after your treatment. If Pap tests have been discontinued, your risk factors (such as having a new sexual partner) need to be reassessed to determine if screening should be resumed. Some women have medical problems that increase the chance of getting cervical cancer. In these cases, your health care provider may recommend more frequent screening and Pap tests.  The HPV test is an additional test that may be used for cervical cancer screening. The HPV test looks for the virus that can cause the cell changes on the cervix. The cells collected during the Pap test can be  tested for HPV. The HPV test could be used to screen women aged 30 years and older, and should be used in women of any age who have unclear Pap test results. After the age of 30, women should have HPV testing at the same frequency as a Pap test.  Colorectal cancer can be detected and often prevented. Most routine colorectal cancer screening begins at the age of 50 years and continues through age 75 years. However, your health care provider may recommend screening at an earlier age if you have risk factors for colon cancer. On a yearly basis, your health care provider may provide home test kits to check for hidden blood in the stool. Use of a small camera at the end of a tube, to directly examine the colon (sigmoidoscopy or colonoscopy), can detect the earliest forms of colorectal cancer. Talk to your health care provider about this at age 50, when routine screening begins. Direct  exam of the colon should be repeated every 5-10 years through age 75 years, unless early forms of pre-cancerous polyps or small growths are found.  People who are at an increased risk for hepatitis B should be screened for this virus. You are considered at high risk for hepatitis B if:  You were born in a country where hepatitis B occurs often. Talk with your health care provider about which countries are considered high risk.  Your parents were born in a high-risk country and you have not received a shot to protect against hepatitis B (hepatitis B vaccine).  You have HIV or AIDS.  You use needles to inject street drugs.  You live with, or have sex with, someone who has hepatitis B.  You get hemodialysis treatment.  You take certain medicines for conditions like cancer, organ transplantation, and autoimmune conditions.  Hepatitis C blood testing is recommended for all people born from 1945 through 1965 and any individual with known risks for hepatitis C.  Practice safe sex. Use condoms and avoid high-risk sexual practices to reduce the spread of sexually transmitted infections (STIs). STIs include gonorrhea, chlamydia, syphilis, trichomonas, herpes, HPV, and human immunodeficiency virus (HIV). Herpes, HIV, and HPV are viral illnesses that have no cure. They can result in disability, cancer, and death.  You should be screened for sexually transmitted illnesses (STIs) including gonorrhea and chlamydia if:  You are sexually active and are younger than 24 years.  You are older than 24 years and your health care provider tells you that you are at risk for this type of infection.  Your sexual activity has changed since you were last screened and you are at an increased risk for chlamydia or gonorrhea. Ask your health care provider if you are at risk.  If you are at risk of being infected with HIV, it is recommended that you take a prescription medicine daily to prevent HIV infection. This is  called preexposure prophylaxis (PrEP). You are considered at risk if:  You are a heterosexual woman, are sexually active, and are at increased risk for HIV infection.  You take drugs by injection.  You are sexually active with a partner who has HIV.  Talk with your health care provider about whether you are at high risk of being infected with HIV. If you choose to begin PrEP, you should first be tested for HIV. You should then be tested every 3 months for as long as you are taking PrEP.  Osteoporosis is a disease in which the bones lose minerals and strength   with aging. This can result in serious bone fractures or breaks. The risk of osteoporosis can be identified using a bone density scan. Women ages 65 years and over and women at risk for fractures or osteoporosis should discuss screening with their health care providers. Ask your health care provider whether you should take a calcium supplement or vitamin D to reduce the rate of osteoporosis.  Menopause can be associated with physical symptoms and risks. Hormone replacement therapy is available to decrease symptoms and risks. You should talk to your health care provider about whether hormone replacement therapy is right for you.  Use sunscreen. Apply sunscreen liberally and repeatedly throughout the day. You should seek shade when your shadow is shorter than you. Protect yourself by wearing long sleeves, pants, a wide-brimmed hat, and sunglasses year round, whenever you are outdoors.  Once a month, do a whole body skin exam, using a mirror to look at the skin on your back. Tell your health care provider of new moles, moles that have irregular borders, moles that are larger than a pencil eraser, or moles that have changed in shape or color.  Stay current with required vaccines (immunizations).  Influenza vaccine. All adults should be immunized every year.  Tetanus, diphtheria, and acellular pertussis (Td, Tdap) vaccine. Pregnant women should  receive 1 dose of Tdap vaccine during each pregnancy. The dose should be obtained regardless of the length of time since the last dose. Immunization is preferred during the 27th-36th week of gestation. An adult who has not previously received Tdap or who does not know her vaccine status should receive 1 dose of Tdap. This initial dose should be followed by tetanus and diphtheria toxoids (Td) booster doses every 10 years. Adults with an unknown or incomplete history of completing a 3-dose immunization series with Td-containing vaccines should begin or complete a primary immunization series including a Tdap dose. Adults should receive a Td booster every 10 years.  Varicella vaccine. An adult without evidence of immunity to varicella should receive 2 doses or a second dose if she has previously received 1 dose. Pregnant females who do not have evidence of immunity should receive the first dose after pregnancy. This first dose should be obtained before leaving the health care facility. The second dose should be obtained 4-8 weeks after the first dose.  Human papillomavirus (HPV) vaccine. Females aged 13-26 years who have not received the vaccine previously should obtain the 3-dose series. The vaccine is not recommended for use in pregnant females. However, pregnancy testing is not needed before receiving a dose. If a female is found to be pregnant after receiving a dose, no treatment is needed. In that case, the remaining doses should be delayed until after the pregnancy. Immunization is recommended for any person with an immunocompromised condition through the age of 26 years if she did not get any or all doses earlier. During the 3-dose series, the second dose should be obtained 4-8 weeks after the first dose. The third dose should be obtained 24 weeks after the first dose and 16 weeks after the second dose.  Zoster vaccine. One dose is recommended for adults aged 60 years or older unless certain conditions are  present.  Measles, mumps, and rubella (MMR) vaccine. Adults born before 1957 generally are considered immune to measles and mumps. Adults born in 1957 or later should have 1 or more doses of MMR vaccine unless there is a contraindication to the vaccine or there is laboratory evidence of immunity to   each of the three diseases. A routine second dose of MMR vaccine should be obtained at least 28 days after the first dose for students attending postsecondary schools, health care workers, or international travelers. People who received inactivated measles vaccine or an unknown type of measles vaccine during 1963-1967 should receive 2 doses of MMR vaccine. People who received inactivated mumps vaccine or an unknown type of mumps vaccine before 1979 and are at high risk for mumps infection should consider immunization with 2 doses of MMR vaccine. For females of childbearing age, rubella immunity should be determined. If there is no evidence of immunity, females who are not pregnant should be vaccinated. If there is no evidence of immunity, females who are pregnant should delay immunization until after pregnancy. Unvaccinated health care workers born before 1957 who lack laboratory evidence of measles, mumps, or rubella immunity or laboratory confirmation of disease should consider measles and mumps immunization with 2 doses of MMR vaccine or rubella immunization with 1 dose of MMR vaccine.  Pneumococcal 13-valent conjugate (PCV13) vaccine. When indicated, a person who is uncertain of her immunization history and has no record of immunization should receive the PCV13 vaccine. An adult aged 19 years or older who has certain medical conditions and has not been previously immunized should receive 1 dose of PCV13 vaccine. This PCV13 should be followed with a dose of pneumococcal polysaccharide (PPSV23) vaccine. The PPSV23 vaccine dose should be obtained at least 8 weeks after the dose of PCV13 vaccine. An adult aged 19  years or older who has certain medical conditions and previously received 1 or more doses of PPSV23 vaccine should receive 1 dose of PCV13. The PCV13 vaccine dose should be obtained 1 or more years after the last PPSV23 vaccine dose.  Pneumococcal polysaccharide (PPSV23) vaccine. When PCV13 is also indicated, PCV13 should be obtained first. All adults aged 65 years and older should be immunized. An adult younger than age 65 years who has certain medical conditions should be immunized. Any person who resides in a nursing home or long-term care facility should be immunized. An adult smoker should be immunized. People with an immunocompromised condition and certain other conditions should receive both PCV13 and PPSV23 vaccines. People with human immunodeficiency virus (HIV) infection should be immunized as soon as possible after diagnosis. Immunization during chemotherapy or radiation therapy should be avoided. Routine use of PPSV23 vaccine is not recommended for American Indians, [place] Natives, or people younger than 65 years unless there are medical conditions that require PPSV23 vaccine. When indicated, people who have unknown immunization and have no record of immunization should receive PPSV23 vaccine. One-time revaccination 5 years after the first dose of PPSV23 is recommended for people aged 19-64 years who have chronic kidney failure, nephrotic syndrome, asplenia, or immunocompromised conditions. People who received 1-2 doses of PPSV23 before age 65 years should receive another dose of PPSV23 vaccine at age 65 years or later if at least 5 years have passed since the previous dose. Doses of PPSV23 are not needed for people immunized with PPSV23 at or after age 65 years.  Meningococcal vaccine. Adults with asplenia or persistent complement component deficiencies should receive 2 doses of quadrivalent meningococcal conjugate (MenACWY-D) vaccine. The doses should be obtained at least 2 months apart.  Microbiologists working with certain meningococcal bacteria, military recruits, people at risk during an outbreak, and people who travel to or live in countries with a high rate of meningitis should be immunized. A first-year college student up through age   21 years who is living in a residence hall should receive a dose if she did not receive a dose on or after her 16th birthday. Adults who have certain high-risk conditions should receive one or more doses of vaccine.  Hepatitis A vaccine. Adults who wish to be protected from this disease, have certain high-risk conditions, work with hepatitis A-infected animals, work in hepatitis A research labs, or travel to or work in countries with a high rate of hepatitis A should be immunized. Adults who were previously unvaccinated and who anticipate close contact with an international adoptee during the first 60 days after arrival in the [place] from a country with a high rate of hepatitis A should be immunized.  Hepatitis B vaccine. Adults who wish to be protected from this disease, have certain high-risk conditions, may be exposed to blood or other infectious body fluids, are household contacts or sex partners of hepatitis B positive people, are clients or workers in certain care facilities, or travel to or work in countries with a high rate of hepatitis B should be immunized.  Haemophilus influenzae type b (Hib) vaccine. A previously unvaccinated person with asplenia or sickle cell disease or having a scheduled splenectomy should receive 1 dose of Hib vaccine. Regardless of previous immunization, a recipient of a hematopoietic stem cell transplant should receive a 3-dose series 6-12 months after her successful transplant. Hib vaccine is not recommended for adults with HIV infection. Preventive Services / Frequency Ages 64 to 68 years  Blood pressure check.** / Every 1 to 2 years.  Lipid and cholesterol check.** / Every 5 years beginning at age  22.  Clinical breast exam.** / Every 3 years for women in their 88s and 53s.  BRCA-related cancer risk assessment.** / For women who have family members with a BRCA-related cancer (breast, ovarian, tubal, or peritoneal cancers).  Pap test.** / Every 2 years from ages 90 through 51. Every 3 years starting at age 21 through age 56 or 3 with a history of 3 consecutive normal Pap tests.  HPV screening.** / Every 3 years from ages 24 through ages 1 to 46 with a history of 3 consecutive normal Pap tests.  Hepatitis C blood test.** / For any individual with known risks for hepatitis C.  Skin self-exam. / Monthly.  Influenza vaccine. / Every year.  Tetanus, diphtheria, and acellular pertussis (Tdap, Td) vaccine.** / Consult your health care provider. Pregnant women should receive 1 dose of Tdap vaccine during each pregnancy. 1 dose of Td every 10 years.  Varicella vaccine.** / Consult your health care provider. Pregnant females who do not have evidence of immunity should receive the first dose after pregnancy.  HPV vaccine. / 3 doses over 6 months, if 72 and younger. The vaccine is not recommended for use in pregnant females. However, pregnancy testing is not needed before receiving a dose.  Measles, mumps, rubella (MMR) vaccine.** / You need at least 1 dose of MMR if you were born in 1957 or later. You may also need a 2nd dose. For females of childbearing age, rubella immunity should be determined. If there is no evidence of immunity, females who are not pregnant should be vaccinated. If there is no evidence of immunity, females who are pregnant should delay immunization until after pregnancy.  Pneumococcal 13-valent conjugate (PCV13) vaccine.** / Consult your health care provider.  Pneumococcal polysaccharide (PPSV23) vaccine.** / 1 to 2 doses if you smoke cigarettes or if you have certain conditions.  Meningococcal vaccine.** /  1 dose if you are age 19 to 21 years and a first-year college  student living in a residence hall, or have one of several medical conditions, you need to get vaccinated against meningococcal disease. You may also need additional booster doses.  Hepatitis A vaccine.** / Consult your health care provider.  Hepatitis B vaccine.** / Consult your health care provider.  Haemophilus influenzae type b (Hib) vaccine.** / Consult your health care provider. Ages 40 to 64 years  Blood pressure check.** / Every 1 to 2 years.  Lipid and cholesterol check.** / Every 5 years beginning at age 20 years.  Lung cancer screening. / Every year if you are aged 55-80 years and have a 30-pack-year history of smoking and currently smoke or have quit within the past 15 years. Yearly screening is stopped once you have quit smoking for at least 15 years or develop a health problem that would prevent you from having lung cancer treatment.  Clinical breast exam.** / Every year after age 40 years.  BRCA-related cancer risk assessment.** / For women who have family members with a BRCA-related cancer (breast, ovarian, tubal, or peritoneal cancers).  Mammogram.** / Every year beginning at age 40 years and continuing for as long as you are in good health. Consult with your health care provider.  Pap test.** / Every 3 years starting at age 30 years through age 65 or 70 years with a history of 3 consecutive normal Pap tests.  HPV screening.** / Every 3 years from ages 30 years through ages 65 to 70 years with a history of 3 consecutive normal Pap tests.  Fecal occult blood test (FOBT) of stool. / Every year beginning at age 50 years and continuing until age 75 years. You may not need to do this test if you get a colonoscopy every 10 years.  Flexible sigmoidoscopy or colonoscopy.** / Every 5 years for a flexible sigmoidoscopy or every 10 years for a colonoscopy beginning at age 50 years and continuing until age 75 years.  Hepatitis C blood test.** / For all people born from 1945 through  1965 and any individual with known risks for hepatitis C.  Skin self-exam. / Monthly.  Influenza vaccine. / Every year.  Tetanus, diphtheria, and acellular pertussis (Tdap/Td) vaccine.** / Consult your health care provider. Pregnant women should receive 1 dose of Tdap vaccine during each pregnancy. 1 dose of Td every 10 years.  Varicella vaccine.** / Consult your health care provider. Pregnant females who do not have evidence of immunity should receive the first dose after pregnancy.  Zoster vaccine.** / 1 dose for adults aged 60 years or older.  Measles, mumps, rubella (MMR) vaccine.** / You need at least 1 dose of MMR if you were born in 1957 or later. You may also need a 2nd dose. For females of childbearing age, rubella immunity should be determined. If there is no evidence of immunity, females who are not pregnant should be vaccinated. If there is no evidence of immunity, females who are pregnant should delay immunization until after pregnancy.  Pneumococcal 13-valent conjugate (PCV13) vaccine.** / Consult your health care provider.  Pneumococcal polysaccharide (PPSV23) vaccine.** / 1 to 2 doses if you smoke cigarettes or if you have certain conditions.  Meningococcal vaccine.** / Consult your health care provider.  Hepatitis A vaccine.** / Consult your health care provider.  Hepatitis B vaccine.** / Consult your health care provider.  Haemophilus influenzae type b (Hib) vaccine.** / Consult your health care provider. Ages 65   years and over  Blood pressure check.** / Every 1 to 2 years.  Lipid and cholesterol check.** / Every 5 years beginning at age 22 years.  Lung cancer screening. / Every year if you are aged 73-80 years and have a 30-pack-year history of smoking and currently smoke or have quit within the past 15 years. Yearly screening is stopped once you have quit smoking for at least 15 years or develop a health problem that would prevent you from having lung cancer  treatment.  Clinical breast exam.** / Every year after age 4 years.  BRCA-related cancer risk assessment.** / For women who have family members with a BRCA-related cancer (breast, ovarian, tubal, or peritoneal cancers).  Mammogram.** / Every year beginning at age 40 years and continuing for as long as you are in good health. Consult with your health care provider.  Pap test.** / Every 3 years starting at age 9 years through age 34 or 91 years with 3 consecutive normal Pap tests. Testing can be stopped between 65 and 70 years with 3 consecutive normal Pap tests and no abnormal Pap or HPV tests in the past 10 years.  HPV screening.** / Every 3 years from ages 57 years through ages 64 or 45 years with a history of 3 consecutive normal Pap tests. Testing can be stopped between 65 and 70 years with 3 consecutive normal Pap tests and no abnormal Pap or HPV tests in the past 10 years.  Fecal occult blood test (FOBT) of stool. / Every year beginning at age 15 years and continuing until age 17 years. You may not need to do this test if you get a colonoscopy every 10 years.  Flexible sigmoidoscopy or colonoscopy.** / Every 5 years for a flexible sigmoidoscopy or every 10 years for a colonoscopy beginning at age 86 years and continuing until age 71 years.  Hepatitis C blood test.** / For all people born from [birth year] through 1965 and any individual with known risks for hepatitis C.  Osteoporosis screening.** / A one-time screening for women ages 83 years and over and women at risk for fractures or osteoporosis.  Skin self-exam. / Monthly.  Influenza vaccine. / Every year.  Tetanus, diphtheria, and acellular pertussis (Tdap/Td) vaccine.** / 1 dose of Td every 10 years.  Varicella vaccine.** / Consult your health care provider.  Zoster vaccine.** / 1 dose for adults aged 61 years or older.  Pneumococcal 13-valent conjugate (PCV13) vaccine.** / Consult your health care provider.  Pneumococcal  polysaccharide (PPSV23) vaccine.** / 1 dose for all adults aged 28 years and older.  Meningococcal vaccine.** / Consult your health care provider.  Hepatitis A vaccine.** / Consult your health care provider.  Hepatitis B vaccine.** / Consult your health care provider.  Haemophilus influenzae type b (Hib) vaccine.** / Consult your health care provider. ** Family history and personal history of risk and conditions may change your health care provider's recommendations. Document Released: 11/05/2001 Document Revised: 01/24/2014 Document Reviewed: 02/04/2011 [facility] Patient Information 2015 [place], [place]. This information is not intended to replace advice given to you by your health care provider. Make sure you discuss any questions you have with your health care provider.

## 2014-04-20 LAB — CYTOLOGY - PAP

## 2014-05-26 ENCOUNTER — Ambulatory Visit: Payer: Medicaid Other

## 2014-07-25 ENCOUNTER — Encounter: Payer: Self-pay | Admitting: Obstetrics & Gynecology

## 2014-07-29 ENCOUNTER — Ambulatory Visit: Payer: Medicaid Other | Attending: Internal Medicine

## 2014-08-04 ENCOUNTER — Telehealth: Payer: Self-pay | Admitting: *Deleted

## 2014-08-04 DIAGNOSIS — B379 Candidiasis, unspecified: Secondary | ICD-10-CM

## 2014-08-04 MED ORDER — FLUCONAZOLE 150 MG PO TABS: 150.0000 mg | ORAL_TABLET | Freq: Once | ORAL | Status: DC

## 2014-08-04 NOTE — Telephone Encounter (Signed)
Patient reports yeast infection. Diflucan sent in per protocol.

## 2014-08-24 ENCOUNTER — Ambulatory Visit: Payer: Medicaid Other | Admitting: Family Medicine

## 2014-09-08 ENCOUNTER — Encounter: Payer: Self-pay | Admitting: Family Medicine

## 2014-09-08 ENCOUNTER — Ambulatory Visit: Payer: Self-pay | Attending: Family Medicine | Admitting: Family Medicine

## 2014-09-08 ENCOUNTER — Ambulatory Visit (HOSPITAL_COMMUNITY)
Admission: RE | Admit: 2014-09-08 | Discharge: 2014-09-08 | Disposition: A | Discharge status: Home / Self Care | Payer: Self-pay | Source: Ambulatory Visit | Attending: Family Medicine | Admitting: Family Medicine

## 2014-09-08 VITALS — BP 129/82 | HR 64 | Temp 97.4°F | Resp 18 | Ht 61.0 in | Wt 143.0 lb

## 2014-09-08 DIAGNOSIS — M79671 Pain in right foot: Secondary | ICD-10-CM | POA: Insufficient documentation

## 2014-09-08 DIAGNOSIS — D225 Melanocytic nevi of trunk: Secondary | ICD-10-CM | POA: Insufficient documentation

## 2014-09-08 DIAGNOSIS — M79672 Pain in left foot: Secondary | ICD-10-CM | POA: Insufficient documentation

## 2014-09-08 DIAGNOSIS — I10 Essential (primary) hypertension: Secondary | ICD-10-CM | POA: Insufficient documentation

## 2014-09-08 DIAGNOSIS — K589 Irritable bowel syndrome without diarrhea: Secondary | ICD-10-CM | POA: Insufficient documentation

## 2014-09-08 DIAGNOSIS — D235 Other benign neoplasm of skin of trunk: Secondary | ICD-10-CM

## 2014-09-08 MED ORDER — AMLODIPINE BESYLATE 10 MG PO TABS: 10.0000 mg | ORAL_TABLET | Freq: Every day | ORAL | Status: DC

## 2014-09-08 MED ORDER — HYOSCYAMINE SULFATE 0.125 MG SL SUBL: 0.1250 mg | SUBLINGUAL_TABLET | Freq: Four times a day (QID) | SUBLINGUAL | Status: DC | PRN

## 2014-09-08 MED ORDER — HYOSCYAMINE SULFATE 0.125 MG PO TABS: 0.1250 mg | ORAL_TABLET | Freq: Four times a day (QID) | ORAL | Status: DC | PRN

## 2014-09-08 MED ORDER — HYDROCHLOROTHIAZIDE 25 MG PO TABS: 12.5000 mg | ORAL_TABLET | Freq: Every day | ORAL | Status: DC

## 2014-09-08 NOTE — Assessment & Plan Note (Signed)
A: b/l 5th metatarsal pain. Possible neuroma  P:  Foot x-ray Podiatry referral

## 2014-09-08 NOTE — Assessment & Plan Note (Signed)
A: IBS with predominantly bloating P:  Restart levsin

## 2014-09-08 NOTE — Assessment & Plan Note (Signed)
A: very small nevi irregular only because of color variation. Nevi on R upper gluteal also irregularly shaped, 3 coalesced nevi P: Reassurance with derm referral for f/u  Patient is from [place] and has had significant sun exposure during her lifetime

## 2014-09-08 NOTE — Patient Instructions (Addendum)
Rhonda Graves,  Thank you for coming in today. It was a pleasure meeting you. I look forward to being your primary doctor.   1. IBS: restart levsin   2. HTN: Increase Norvasc to 10 mg daily Taper off HCTZ by decreasing to 1/2 tab, 12.5 mg for next two weeks then stopping of BP well controlled   3. Foot pain: foot x-rays.Podiatry referral.   4.  Nevi- very small and most likely benign. I have placed a dermatology referral for monitoring and possible biopsy.   F/u in 2-3 weeks with RN for BP check.  F/u with me in 2 months   Dr. Adrian Blackwater

## 2014-09-08 NOTE — Progress Notes (Signed)
Establish Care Medicine Refills, Hx HTN

## 2014-09-08 NOTE — Progress Notes (Signed)
   Subjective:    Patient ID: Rhonda Graves, female    DOB: [birth year], 33 y.o.   MRN: [id number] CC: establish care, HTN, b/l foot pain x 3 months, hx of IBS with worsening symptoms, skin moles  HPI 33 yo F presents to establish care and discuss the following:  1. IBS: patient with IBS with bloating and cramping. No nausea, emesis, fever, diarrhea or constipation. She has symptoms irregularly about 3-4 times per year. She was evaluated by GI while living in [place]. She was prescribed Levsin and simethicone. She is still taking the simethicone as needed.   2. HTN: since two weeks following birth of her daughter in 11/2013. She is taking Norvasc 5 mg daily and HCTZ 25 mg daily. She would like to control her BP with one agent only if possible. She denies HA, vision change, CP, SOB, leg swelling.    3. Foot pain: x 3 months. B/l L>R. On the 5th metatarsal. No deformity, swelling or injury.   4. Skin moles: had a screening done at a health fair. Has irregular small skin moles on her abdomen, and R upper gluteal.   Soc Hx: non smoker Med Hx: HTN dx in  Surg Hx: C-section in 11/2013  Review of Systems As per HPI     Objective:   Physical Exam BP 129/82 mmHg  Pulse 64  Temp(Src) 97.4 F (36.3 C) (Oral)  Resp 18  Ht 5\' 1"  (1.549 m)  Wt 143 lb (64.864 kg)  BMI 27.03 kg/m2  SpO2 97%  LMP 09/05/2014 General appearance: alert, cooperative and no distress Lungs: clear to auscultation bilaterally Heart: regular rate and rhythm, S1, S2 normal, no murmur, click, rub or gallop Abdomen: soft, flat, TTP b/l LQ w/o masses, rebound or guarding.  Extremities: extremities normal, atraumatic, no cyanosis or edema  Normal foot with preserved arch Mild TTP proximal 5th metarsal w/o erythema, edema or deformity  Skin: Small 2 mm mole above umbilicus with central hyperpigmentation  Small cluster of 3 moles R upper gluteal with central hyperpigmentation       Assessment & Plan:

## 2014-09-08 NOTE — Assessment & Plan Note (Signed)
A: BP well controlled P:  Increase norvasc to 10 daily  Decrease HCTZ to 12.5 mg daily for two weeks, then off. RN BP check in 2-3 weeks F/u with me in 2 months

## 2014-09-26 ENCOUNTER — Telehealth: Payer: Self-pay | Admitting: *Deleted

## 2014-09-26 NOTE — Telephone Encounter (Signed)
Pt aware of X-Ray Results

## 2014-09-26 NOTE — Telephone Encounter (Signed)
-----   Message from Minerva Ends, MD sent at 09/19/2014 12:35 PM EST ----- Normal foot x-ray

## 2014-09-30 ENCOUNTER — Ambulatory Visit: Payer: Self-pay | Attending: Family Medicine | Admitting: *Deleted

## 2014-09-30 VITALS — BP 110/68 | HR 65 | Temp 98.3°F | Resp 20

## 2014-09-30 DIAGNOSIS — I10 Essential (primary) hypertension: Secondary | ICD-10-CM

## 2014-09-30 NOTE — Progress Notes (Signed)
Patient presents for BP check Med list reviewed; states taking all meds as directed States was to taper off HCTZ and took last dose this AM Discussed need for low sodium diet and using Mrs. Dash as alternative to salt  BP 110/68 P 65 R 20  T  98.3 oral SPO2 98%  Patient advised to call for med refills at least 7 days before running out so as not to go without. Patient aware that she is to f/u with PCP 2 months from last visit (Due 11/09/14)

## 2014-10-04 ENCOUNTER — Ambulatory Visit: Payer: Self-pay | Admitting: Podiatry

## 2014-10-07 ENCOUNTER — Ambulatory Visit: Payer: Self-pay | Admitting: Podiatrist

## 2014-10-17 ENCOUNTER — Ambulatory Visit: Payer: Self-pay

## 2014-10-17 ENCOUNTER — Ambulatory Visit (INDEPENDENT_AMBULATORY_CARE_PROVIDER_SITE_OTHER): Payer: No Typology Code available for payment source | Admitting: Podiatry

## 2014-10-17 ENCOUNTER — Encounter: Payer: Self-pay | Admitting: Podiatry

## 2014-10-17 VITALS — BP 129/91 | HR 63 | Resp 16

## 2014-10-17 DIAGNOSIS — M779 Enthesopathy, unspecified: Secondary | ICD-10-CM

## 2014-10-17 NOTE — Progress Notes (Signed)
   Subjective:    Patient ID: Rhonda Graves, female    DOB: Feb 21, 1981, 34 y.o.   MRN: [id number]  HPI Comments: "I have pain on the sides"  Patient c/o aching lateral foot for few months. No injury. She saw PCP and they xrayed-no fracture. No other treatments tried.  Foot Pain      Review of Systems  All other systems reviewed and are negative.      Objective:   Physical Exam        Assessment & Plan:

## 2014-10-19 NOTE — Progress Notes (Signed)
Subjective:     Patient ID: Rhonda Graves, female   DOB: [birth year], 34 y.o.   MRN: [id number]  HPI patient presents stating I'm having pain on the outside of both my feet and I had an x-ray done which revealed no fractures and I have tried to be more active which is probably part of the problem   Review of Systems  All other systems reviewed and are negative.      Objective:   Physical Exam  Cardiovascular: Intact distal pulses.   Musculoskeletal: Normal range of motion.  Neurological: She is alert.  Skin: Skin is warm.  Nursing note and vitals reviewed.  neurovascular status intact muscle strength adequate with range of motion within normal limits. Lateral side fifth metatarsal base are inflamed on both feet which is probably due to excessive activity and has been present for approximately 6 months     Assessment:     Tendinitis bilateral    Plan:     Careful injection at the insertion 3 mg Kenalog 5 mg Xylocaine and advised on reduced activity and ice therapy. Reappoint if symptoms persist

## 2014-12-02 ENCOUNTER — Ambulatory Visit: Payer: Self-pay | Attending: Family Medicine | Admitting: Family Medicine

## 2014-12-02 VITALS — BP 131/82 | HR 68 | Temp 98.5°F | Resp 16 | Ht 61.0 in | Wt 140.0 lb

## 2014-12-02 DIAGNOSIS — J069 Acute upper respiratory infection, unspecified: Secondary | ICD-10-CM | POA: Insufficient documentation

## 2014-12-02 NOTE — Progress Notes (Signed)
Sore throat and nasal congestion with cough and ear pain x 1 day Patient has taken ibuprofen and sudafed

## 2014-12-02 NOTE — Patient Instructions (Signed)
Consider yourself contagious for 4 days from start of ST.  Some symptoms may last up to 2 weeks or more.  Return if worsens after one week from start. May use OTC symptomatic measures such as tylenol, motrin, robitussin DM, ST logzenes. Ask pharmacy about OTC med for congestion with HTN  Lots of fluids.Upper Respiratory Infection, Adult An upper respiratory infection (URI) is also sometimes known as the common cold. The upper respiratory tract includes the nose, sinuses, throat, trachea, and bronchi. Bronchi are the airways leading to the lungs. Most people improve within 1 week, but symptoms can last up to 2 weeks. A residual cough may last even longer.  CAUSES Many different viruses can infect the tissues lining the upper respiratory tract. The tissues become irritated and inflamed and often become very moist. Mucus production is also common. A cold is contagious. You can easily spread the virus to others by oral contact. This includes kissing, sharing a glass, coughing, or sneezing. Touching your mouth or nose and then touching a surface, which is then touched by another person, can also spread the virus. SYMPTOMS  Symptoms typically develop 1 to 3 days after you come in contact with a cold virus. Symptoms vary from person to person. They may include:  Runny nose.  Sneezing.  Nasal congestion.  Sinus irritation.  Sore throat.  Loss of voice (laryngitis).  Cough.  Fatigue.  Muscle aches.  Loss of appetite.  Headache.  Low-grade fever. DIAGNOSIS  You might diagnose your own cold based on familiar symptoms, since most people get a cold 2 to 3 times a year. Your caregiver can confirm this based on your exam. Most importantly, your caregiver can check that your symptoms are not due to another disease such as strep throat, sinusitis, pneumonia, asthma, or epiglottitis. Blood tests, throat tests, and X-rays are not necessary to diagnose a common cold, but they may sometimes be helpful  in excluding other more serious diseases. Your caregiver will decide if any further tests are required. RISKS AND COMPLICATIONS  You may be at risk for a more severe case of the common cold if you smoke cigarettes, have chronic heart disease (such as heart failure) or lung disease (such as asthma), or if you have a weakened immune system. The very young and very old are also at risk for more serious infections. Bacterial sinusitis, middle ear infections, and bacterial pneumonia can complicate the common cold. The common cold can worsen asthma and chronic obstructive pulmonary disease (COPD). Sometimes, these complications can require emergency medical care and may be life-threatening. PREVENTION  The best way to protect against getting a cold is to practice good hygiene. Avoid oral or hand contact with people with cold symptoms. Wash your hands often if contact occurs. There is no clear evidence that vitamin C, vitamin E, echinacea, or exercise reduces the chance of developing a cold. However, it is always recommended to get plenty of rest and practice good nutrition. TREATMENT  Treatment is directed at relieving symptoms. There is no cure. Antibiotics are not effective, because the infection is caused by a virus, not by bacteria. Treatment may include:  Increased fluid intake. Sports drinks offer valuable electrolytes, sugars, and fluids.  Breathing heated mist or steam (vaporizer or shower).  Eating chicken soup or other clear broths, and maintaining good nutrition.  Getting plenty of rest.  Using gargles or lozenges for comfort.  Controlling fevers with ibuprofen or acetaminophen as directed by your caregiver.  Increasing usage of your inhaler  if you have asthma. Zinc gel and zinc lozenges, taken in the first 24 hours of the common cold, can shorten the duration and lessen the severity of symptoms. Pain medicines may help with fever, muscle aches, and throat pain. A variety of  non-prescription medicines are available to treat congestion and runny nose. Your caregiver can make recommendations and may suggest nasal or lung inhalers for other symptoms.  HOME CARE INSTRUCTIONS   Only take over-the-counter or prescription medicines for pain, discomfort, or fever as directed by your caregiver.  Use a warm mist humidifier or inhale steam from a shower to increase air moisture. This may keep secretions moist and make it easier to breathe.  Drink enough water and fluids to keep your urine clear or pale yellow.  Rest as needed.  Return to work when your temperature has returned to normal or as your caregiver advises. You may need to stay home longer to avoid infecting others. You can also use a face mask and careful hand washing to prevent spread of the virus. SEEK MEDICAL CARE IF:   After the first few days, you feel you are getting worse rather than better.  You need your caregiver's advice about medicines to control symptoms.  You develop chills, worsening shortness of breath, or brown or red sputum. These may be signs of pneumonia.  You develop yellow or brown nasal discharge or pain in the face, especially when you bend forward. These may be signs of sinusitis.  You develop a fever, swollen neck glands, pain with swallowing, or white areas in the back of your throat. These may be signs of strep throat. SEEK IMMEDIATE MEDICAL CARE IF:   You have a fever.  You develop severe or persistent headache, ear pain, sinus pain, or chest pain.  You develop wheezing, a prolonged cough, cough up blood, or have a change in your usual mucus (if you have chronic lung disease).  You develop sore muscles or a stiff neck. Document Released: 03/05/2001 Document Revised: 12/02/2011 Document Reviewed: 12/15/2013 [facility] Patient Information 2015 [place], [place]. This information is not intended to replace advice given to you by your health care provider. Make sure you discuss any  questions you have with your health care provider.

## 2014-12-02 NOTE — Progress Notes (Signed)
Subjective:     Patient ID: Rhonda Graves, female   DOB: 10-22-1980, 34 y.o.   MRN: [id number]  HPI  3 day history of ST, nasal congestion, cough, ears feeling funny., HA and fatigue.She denies  Fever Chills or other symptoms not suggestive of URI. She has a history of HTN, [place], IBS,   Review of Systems   See HPI     Objective:   Physical Exam General:  Alert, oriented, appropriate in no acute distress HEENT:  TMS are clear, nose is congested, throat shows general erythema, without tonsillar swelling or exudate.  Neck:  Supple FROM w/o adenopathy, there is mild generalized tenderness of the glands Lungs:  Clear to auscultation\ HS are regular w/o m,g,r    Assessment:     Viral URI    Plan:     Provided information sheet. Explained that there is no concern for Strep.

## 2014-12-09 ENCOUNTER — Ambulatory Visit: Payer: Self-pay | Attending: Family Medicine | Admitting: Family Medicine

## 2014-12-09 ENCOUNTER — Encounter: Payer: Self-pay | Admitting: Family Medicine

## 2014-12-09 VITALS — BP 125/83 | HR 53 | Temp 98.1°F | Resp 16 | Ht 61.0 in | Wt 140.0 lb

## 2014-12-09 DIAGNOSIS — R21 Rash and other nonspecific skin eruption: Secondary | ICD-10-CM | POA: Insufficient documentation

## 2014-12-09 DIAGNOSIS — D225 Melanocytic nevi of trunk: Secondary | ICD-10-CM | POA: Insufficient documentation

## 2014-12-09 DIAGNOSIS — I10 Essential (primary) hypertension: Secondary | ICD-10-CM | POA: Insufficient documentation

## 2014-12-09 DIAGNOSIS — D235 Other benign neoplasm of skin of trunk: Secondary | ICD-10-CM

## 2014-12-09 MED ORDER — TRIAMCINOLONE ACETONIDE 0.1 % EX CREA: 1.0000 "application " | TOPICAL_CREAM | Freq: Two times a day (BID) | CUTANEOUS | Status: DC | PRN

## 2014-12-09 NOTE — Patient Instructions (Addendum)
Rhonda Graves,   1. HTN: BP at goal Continue norvasc If you would like to taper start 5 mg daily for two week then stop  2. Skin rash: Appears allergic claritin or zyrtec daily Benadryl night  Triamcinolone-steroid cream if needed  3. Mole: first dermatology referral placed on 09/08/14. There was a comment about scheduling you with the dermatology clinic in January here on site. This was officially missed and/or not communicated.  I have placed a new derm referral today.  F/u in 3 months for HTN  Dr. Adrian Blackwater

## 2014-12-09 NOTE — Progress Notes (Signed)
   Subjective:    Patient ID: Rhonda Graves, female    DOB: Jun 24, 1981, 34 y.o.   MRN: [id number] CC: HTN f/u:  HPI  CHRONIC HYPERTENSION  Disease Monitoring  Blood pressure range: not checking   Chest pain: no   Dyspnea: no   Claudication: no   Medication compliance: yes, norvasc 10 daily  Medication Side Effects  Lightheadedness: no   Urinary frequency: no   Edema: no   Preventitive Healthcare:  Exercise: no   Diet Pattern: 3 meals   Salt Restriction: yes   Soc Hx: non smoker  Review of Systems     Objective:   Physical Exam BP 125/83 mmHg  Pulse 53  Temp(Src) 98.1 F (36.7 C) (Oral)  Resp 16  Ht 5\' 1"  (1.549 m)  Wt 140 lb (63.504 kg)  BMI 26.47 kg/m2  SpO2 100%  LMP 11/25/2014 General appearance: alert, cooperative and no distress Lungs: clear to auscultation bilaterally Heart: regular rate and rhythm, S1, S2 normal, no murmur, click, rub or gallop Extremities: extremities normal, atraumatic, no cyanosis or edema Skin: xerotic with papular rash on arm, stable mole       Assessment & Plan:

## 2014-12-09 NOTE — Progress Notes (Signed)
F/U HTN No Hx tobacco Taking medication as prescribed Complaining of rash on leg and arm

## 2014-12-12 NOTE — Assessment & Plan Note (Signed)
HTN: BP at goal Continue norvasc If you would like to taper start 5 mg daily for two week then stop

## 2014-12-12 NOTE — Assessment & Plan Note (Signed)
Mole: first dermatology referral placed on 09/08/14. There was a comment about scheduling you with the dermatology clinic in January here on site. This was officially missed and/or not communicated.  I have placed a new derm referral today.

## 2014-12-12 NOTE — Assessment & Plan Note (Signed)
Skin rash: Appears allergic claritin or zyrtec daily Benadryl night  Triamcinolone-steroid cream if needed

## 2015-03-31 ENCOUNTER — Ambulatory Visit: Payer: Medicaid Other | Attending: Family Medicine

## 2015-04-03 ENCOUNTER — Other Ambulatory Visit: Payer: Self-pay | Admitting: *Deleted

## 2015-04-03 ENCOUNTER — Telehealth: Payer: Self-pay | Admitting: General Practice

## 2015-04-03 ENCOUNTER — Other Ambulatory Visit: Payer: Self-pay | Admitting: Family Medicine

## 2015-04-03 DIAGNOSIS — I10 Essential (primary) hypertension: Secondary | ICD-10-CM

## 2015-04-03 MED ORDER — AMLODIPINE BESYLATE 10 MG PO TABS: 10.0000 mg | ORAL_TABLET | Freq: Every day | ORAL | Status: DC

## 2015-04-03 NOTE — Telephone Encounter (Signed)
Patient presents to clinic to request medication refill for amLODipine (NORVASC) 10 MG tablet Please assist 

## 2015-04-03 NOTE — Telephone Encounter (Signed)
Rx refilled send to [facility] Pt advised need OV for future refill

## 2015-04-06 ENCOUNTER — Ambulatory Visit: Payer: Medicaid Other | Admitting: Family Medicine

## 2015-04-21 ENCOUNTER — Ambulatory Visit: Payer: Self-pay | Attending: Family Medicine | Admitting: Family Medicine

## 2015-04-21 ENCOUNTER — Encounter: Payer: Self-pay | Admitting: Family Medicine

## 2015-04-21 DIAGNOSIS — M79671 Pain in right foot: Secondary | ICD-10-CM

## 2015-04-21 DIAGNOSIS — M79672 Pain in left foot: Secondary | ICD-10-CM

## 2015-04-21 DIAGNOSIS — I10 Essential (primary) hypertension: Secondary | ICD-10-CM | POA: Insufficient documentation

## 2015-04-21 MED ORDER — NIFEDIPINE ER OSMOTIC RELEASE 30 MG PO TB24: 30.0000 mg | ORAL_TABLET | Freq: Every day | ORAL | Status: DC

## 2015-04-21 MED ORDER — AMLODIPINE BESYLATE 10 MG PO TABS: 10.0000 mg | ORAL_TABLET | Freq: Every day | ORAL | Status: DC

## 2015-04-21 NOTE — Patient Instructions (Signed)
Rhonda Graves,  Thank you for coming in today  1. HTN: BP at goal Continue amlodipine 10 mg once daily for one more month In preparation for conception start nifedipine 30 mg once daily next month  Also start prenatal vitamin or folic acid 1 mg daily supplements   F/u in 6 weeks for HTN and flu shot  Dr. Adrian Blackwater

## 2015-04-21 NOTE — Progress Notes (Signed)
F/U HTN Stated will like to change BP medication, planing to be pregnant  No hx tobacco

## 2015-04-21 NOTE — Progress Notes (Signed)
   Subjective:    Patient ID: Rhonda Graves, female    DOB: 01-Jun-1981, 34 y.o.   MRN: [id number] CC: f/u HTN, new podiatry referral  HPI 34 yo F presents for f/u appt  1. HTN:  CHRONIC HYPERTENSION: would like to conceive in 2 months from now. Requesting change in BP medicine to one safer in pregnancy.   Disease Monitoring  Blood pressure range: not checking   Chest pain: no   Dyspnea: no   Claudication: no   Medication compliance: yes  Medication Side Effects  Lightheadedness: no   Urinary frequency: no   Edema: no   Impotence: no   Preventitive Healthcare:  Exercise: yes      History  Substance Use Topics  . Smoking status: Never Smoker   . Smokeless tobacco: Never Used  . Alcohol Use: Yes     Comment: Not currently drinking    Review of Systems  Constitutional: Negative for fever and chills.  Respiratory: Negative for shortness of breath.   Cardiovascular: Negative for chest pain.  Gastrointestinal: Negative for abdominal pain and blood in stool.  Musculoskeletal: Positive for arthralgias. Negative for back pain and joint swelling.  Skin: Negative for rash.  Psychiatric/Behavioral: Negative for suicidal ideas and dysphoric mood.      Objective:   Physical Exam BP 120/76 mmHg  Pulse 69  Temp(Src) 98.7 F (37.1 C) (Oral)  Resp 16  Ht 5\' 1"  (1.549 m)  Wt 138 lb (62.596 kg)  BMI 26.09 kg/m2  SpO2 97%  LMP 04/13/2015 General appearance: alert, cooperative and no distress Lungs: clear to auscultation bilaterally Heart: regular rate and rhythm, S1, S2 normal, no murmur, click, rub or gallop Extremities: extremities normal, atraumatic, no cyanosis or edema     Assessment & Plan:

## 2015-04-21 NOTE — Assessment & Plan Note (Signed)
HTN: BP at goal Continue amlodipine 10 mg once daily for one more month In preparation for conception start nifedipine 30 mg once daily next month

## 2015-05-01 ENCOUNTER — Ambulatory Visit (INDEPENDENT_AMBULATORY_CARE_PROVIDER_SITE_OTHER): Payer: No Typology Code available for payment source | Admitting: Podiatry

## 2015-05-01 ENCOUNTER — Encounter: Payer: Self-pay | Admitting: Podiatry

## 2015-05-01 ENCOUNTER — Ambulatory Visit (INDEPENDENT_AMBULATORY_CARE_PROVIDER_SITE_OTHER): Payer: No Typology Code available for payment source

## 2015-05-01 VITALS — BP 132/85 | HR 54 | Resp 11 | Ht 61.0 in | Wt 140.0 lb

## 2015-05-01 DIAGNOSIS — M779 Enthesopathy, unspecified: Secondary | ICD-10-CM

## 2015-05-01 DIAGNOSIS — M79673 Pain in unspecified foot: Secondary | ICD-10-CM

## 2015-05-01 MED ORDER — DICLOFENAC SODIUM 75 MG PO TBEC: 75.0000 mg | DELAYED_RELEASE_TABLET | Freq: Two times a day (BID) | ORAL | Status: DC

## 2015-05-01 NOTE — Progress Notes (Signed)
   Subjective:    Patient ID: Rhonda Graves, female    DOB: October 27, 1980, 34 y.o.   MRN: [id number]  HPI    Review of Systems  All other systems reviewed and are negative.      Objective:   Physical Exam        Assessment & Plan:

## 2015-05-02 ENCOUNTER — Telehealth: Payer: Self-pay | Admitting: *Deleted

## 2015-05-02 NOTE — Progress Notes (Signed)
Subjective:     Patient ID: Rhonda Graves, female   DOB: 21-Jun-1981, 34 y.o.   MRN: [id number]  HPI   Patient states she has started to develop pain in the outside of both feet again and she also has some mild pain in her arch  Review of Systems     Objective:   Physical Exam  pain at the peroneal base fifth metatarsal bilateral with fluid buildup and mild discomfort in the arch bilateral    Assessment:      tendinitis with fasciitis also present to a mild degree    Plan:      reinjected the peroneal insertion bilateral 3 mg dexamethasone 5 mg Xylocaine and advised on supportive shoe gear and placed on oral anti-inflammatory to help with the plantar arch

## 2015-05-02 NOTE — Telephone Encounter (Signed)
Pt states the pain has worsened in her left foot after the injection, is this normal and what were the results of the x-rays.  I informed pt the pain, is what we call a flare-up after the steroid injection and she should ice at least 3-4 times daily for 10-15 mins each time and rest.  I informed pt the x-rays showed no bones affected, dx as tendonitis.  Pt asked did she need to only take Voltaren as needed.  I told pt to take the 1st rx of Voltaren then if needed refill.  Pt states understanding.

## 2015-05-18 ENCOUNTER — Telehealth: Payer: Self-pay | Admitting: Family Medicine

## 2015-05-18 NOTE — Telephone Encounter (Signed)
Pt. Needs to know if she needs an appointment in order to get clear for work , she need hepatitis b, and  tb---please f/u with patient

## 2015-05-22 NOTE — Telephone Encounter (Signed)
LVM  To return call    Need to call health dep to schedule appointment With Theophilus Kinds (TB Nurse) at [phone]

## 2015-05-22 NOTE — Telephone Encounter (Signed)
Patient needs to know if she needs her Hepatitis B shots and Questionnaire for TB shots. Please follow up.

## 2015-05-24 NOTE — Telephone Encounter (Signed)
Pt was given Health department information  Will Schedule appointment wit TB nurse

## 2015-08-04 ENCOUNTER — Other Ambulatory Visit: Payer: Self-pay | Admitting: Family Medicine

## 2015-09-29 MED FILL — AMLODIPINE BESYLATE 10 MG T: 10 | 30 days supply | Qty: 30 | Fill #2

## 2015-09-29 MED FILL — ?DICLOFENAC SOD DR 75 MG TA: 75 | 25 days supply | Qty: 50 | Fill #1

## 2015-10-16 MED FILL — AMLODIPINE BESYLATE 10 MG T: 10 | 30 days supply | Qty: 30 | Fill #3

## 2015-11-30 MED FILL — AMLODIPINE BESYLATE 10 MG T: 10 | 30 days supply | Qty: 30 | Fill #4

## 2016-01-08 MED FILL — ?AMLODIPINE BESYLATE 10 MG: 10 | 30 days supply | Qty: 30 | Fill #5

## 2016-01-29 ENCOUNTER — Ambulatory Visit: Payer: Medicaid Other | Attending: Family Medicine

## 2016-02-02 ENCOUNTER — Ambulatory Visit: Payer: Self-pay | Attending: Family Medicine | Admitting: Family Medicine

## 2016-02-02 ENCOUNTER — Encounter: Payer: Self-pay | Admitting: Family Medicine

## 2016-02-02 VITALS — BP 128/87 | HR 65 | Temp 97.7°F | Resp 16 | Ht 61.0 in | Wt 137.0 lb

## 2016-02-02 DIAGNOSIS — Z9109 Other allergy status, other than to drugs and biological substances: Secondary | ICD-10-CM

## 2016-02-02 DIAGNOSIS — Z79899 Other long term (current) drug therapy: Secondary | ICD-10-CM | POA: Insufficient documentation

## 2016-02-02 DIAGNOSIS — Z91048 Other nonmedicinal substance allergy status: Secondary | ICD-10-CM

## 2016-02-02 DIAGNOSIS — R4582 Worries: Secondary | ICD-10-CM

## 2016-02-02 DIAGNOSIS — G4700 Insomnia, unspecified: Secondary | ICD-10-CM | POA: Insufficient documentation

## 2016-02-02 DIAGNOSIS — I10 Essential (primary) hypertension: Secondary | ICD-10-CM | POA: Insufficient documentation

## 2016-02-02 LAB — CBC
HCT: 39.9 % (ref 35.0–45.0)
HEMOGLOBIN: 13.2 g/dL (ref 11.7–15.5)
MCH: 30.1 pg (ref 27.0–33.0)
MCHC: 33.1 g/dL (ref 32.0–36.0)
MCV: 91.1 fL (ref 80.0–100.0)
MPV: 10 fL (ref 7.5–12.5)
Platelets: 327 10*3/uL (ref 140–400)
RBC: 4.38 MIL/uL (ref 3.80–5.10)
RDW: 13 % (ref 11.0–15.0)
WBC: 6.1 10*3/uL (ref 3.8–10.8)

## 2016-02-02 LAB — COMPLETE METABOLIC PANEL WITH GFR
ALT: 39 U/L — ABNORMAL HIGH (ref 6–29)
AST: 28 U/L (ref 10–30)
Albumin: 4.3 g/dL (ref 3.6–5.1)
Alkaline Phosphatase: 132 U/L — ABNORMAL HIGH (ref 33–115)
BUN: 13 mg/dL (ref 7–25)
CALCIUM: 9.2 mg/dL (ref 8.6–10.2)
CO2: 28 mmol/L (ref 20–31)
Chloride: 100 mmol/L (ref 98–110)
Creat: 0.64 mg/dL (ref 0.50–1.10)
GFR, Est African American: >89 mL/min (ref >=60)
GFR, Est Non African American: >89 mL/min (ref >=60)
Glucose, Bld: 87 mg/dL (ref 65–99)
POTASSIUM: 4.2 mmol/L (ref 3.5–5.3)
Sodium: 136 mmol/L (ref 135–146)
Total Bilirubin: 0.6 mg/dL (ref 0.2–1.2)
Total Protein: 7.2 g/dL (ref 6.1–8.1)

## 2016-02-02 MED ORDER — NIFEDIPINE ER OSMOTIC RELEASE 30 MG PO TB24: 30.0000 mg | ORAL_TABLET | Freq: Every day | ORAL | Status: DC

## 2016-02-02 MED ORDER — ZOLPIDEM TARTRATE 5 MG PO TABS: 5.0000 mg | ORAL_TABLET | Freq: Every evening | ORAL | Status: DC | PRN

## 2016-02-02 MED ORDER — CETIRIZINE HCL 10 MG PO CAPS: 1.0000 | ORAL_CAPSULE | Freq: Every day | ORAL | Status: DC

## 2016-02-02 MED ORDER — EPINEPHRINE 0.3 MG/0.3ML IJ SOAJ: 0.3000 mg | Freq: Once | INTRAMUSCULAR | Status: DC

## 2016-02-02 MED FILL — NIFEDIPINE ER 30 MG TABLET: 30 | 30 days supply | Qty: 30 | Fill #0

## 2016-02-02 MED FILL — ?CETIRIZINE HCL 10 MG TABLE: 10 | 30 days supply | Qty: 30 | Fill #0

## 2016-02-02 NOTE — Progress Notes (Signed)
Subjective:  Patient ID: Rhonda Graves, female    DOB: 1980/10/18  Age: 35 y.o. MRN: [id number]  CC: Hypertension   HPI Rhonda Graves presents for   1. CHRONIC HYPERTENSION  Disease Monitoring  Blood pressure range: 110/80 at home   Chest pain: no   Dyspnea: no   Claudication: no   Medication compliance: yes, she has transitioned to nifedipine in hopes of getting pregnant but has yet to conceive  Medication Side Effects  Lightheadedness: no   Urinary frequency: no   Edema: no   Impotence: no   Preventitive Healthcare:  Exercise: yes   2. Worries: chronic since youth. Dad also a Research officer, trade [employer]. She has trouble sleeping sometimes. Takes melatonin or Ambien that she got from [place] which helps. She denies depression or excessive anxiety. She does stress relief exercise to reduce worrying.   Social History  Substance Use Topics  . Smoking status: Never Smoker   . Smokeless tobacco: Never Used  . Alcohol Use: Yes     Comment: Not currently drinking    Outpatient Prescriptions Prior to Visit  Medication Sig Dispense Refill  . amLODipine (NORVASC) 10 MG tablet Take 1 tablet (10 mg total) by mouth daily. 30 tablet 1  . Cetirizine HCl (ZYRTEC ALLERGY) 10 MG CAPS Take 1 capsule by mouth daily.    . Prenatal Vit-Fe Fumarate-FA (PRENATAL MULTIVITAMIN) TABS tablet Take 1 tablet by mouth daily at 12 noon.    . Simethicone 125 MG CAPS Take by mouth.    Marland Kitchen acetaminophen (TYLENOL) 500 MG tablet Take 500 mg by mouth every 6 (six) hours as needed. Reported on 02/02/2016    . diclofenac (VOLTAREN) 75 MG EC tablet Take 1 tablet (75 mg total) by mouth 2 (two) times daily. 50 tablet 2  . NIFEdipine (PROCARDIA-XL/ADALAT-CC/NIFEDICAL-XL) 30 MG 24 hr tablet Take 1 tablet (30 mg total) by mouth daily. (Patient not taking: Reported on 02/02/2016) 30 tablet 0  . triamcinolone cream (KENALOG) 0.1 % Apply 1 application topically 2 (two) times daily as needed. Rash (Patient not taking: Reported on 02/02/2016) 45 g 2     No facility-administered medications prior to visit.    ROS Review of Systems  Constitutional: Negative for fever and chills.  Eyes: Negative for visual disturbance.  Respiratory: Negative for shortness of breath.   Cardiovascular: Negative for chest pain.  Gastrointestinal: Negative for abdominal pain and blood in stool.  Musculoskeletal: Negative for back pain and arthralgias.  Skin: Negative for rash.  Allergic/Immunologic: Negative for immunocompromised state.  Neurological: Negative for tremors.  Hematological: Negative for adenopathy. Bruises/bleeds easily.  Psychiatric/Behavioral: Positive for sleep disturbance. Negative for suicidal ideas and dysphoric mood. The patient is nervous/anxious.     Objective:  BP 128/87 mmHg  Pulse 65  Temp(Src) 97.7 F (36.5 C) (Oral)  Resp 16  Ht 5\' 1"  (1.549 m)  Wt 137 lb (62.143 kg)  BMI 25.90 kg/m2  SpO2 98%  LMP 01/18/2016  BP/Weight 02/02/2016 05/01/2015 99991111  Systolic BP 0000000 Q000111Q 123456  Diastolic BP 87 85 76  Wt. (Lbs) 137 140 138  BMI 25.9 26.47 26.09   Pulse Readings from Last 3 Encounters:  02/02/16 65  05/01/15 54  04/21/15 69   Physical Exam  Constitutional: She is oriented to person, place, and time. She appears well-developed and well-nourished. No distress.  HENT:  Head: Normocephalic and atraumatic.  Cardiovascular: Normal rate, regular rhythm, normal heart sounds and intact distal pulses.   Pulmonary/Chest: Effort normal and breath sounds normal.  Musculoskeletal: She exhibits no edema.  Neurological: She is alert and oriented to person, place, and time.  Skin: Skin is warm and dry. No rash noted.  Psychiatric: She has a normal mood and affect.    Assessment & Plan:   There are no diagnoses linked to this encounter. Rhonda Graves was seen today for hypertension.  Diagnoses and all orders for this visit:  Worries  Essential hypertension, benign -     NIFEdipine (PROCARDIA-XL/ADALAT-CC/NIFEDICAL-XL) 30 MG  24 hr tablet; Take 1 tablet (30 mg total) by mouth daily. -     COMPLETE METABOLIC PANEL WITH GFR -     CBC  Environmental allergies -     EPINEPHrine 0.3 mg/0.3 mL IJ SOAJ injection; Inject 0.3 mLs (0.3 mg total) into the muscle once. -     Cetirizine HCl (ZYRTEC ALLERGY) 10 MG CAPS; Take 1 capsule (10 mg total) by mouth daily.  Insomnia -     zolpidem (AMBIEN) 5 MG tablet; Take 1 tablet (5 mg total) by mouth at bedtime as needed for sleep.    Meds ordered this encounter  Medications  . EPINEPHrine 0.3 mg/0.3 mL IJ SOAJ injection    Sig: Inject 0.3 mLs (0.3 mg total) into the muscle once.    Dispense:  1 Device    Refill:  2  . Cetirizine HCl (ZYRTEC ALLERGY) 10 MG CAPS    Sig: Take 1 capsule (10 mg total) by mouth daily.    Dispense:  30 capsule    Refill:  11  . NIFEdipine (PROCARDIA-XL/ADALAT-CC/NIFEDICAL-XL) 30 MG 24 hr tablet    Sig: Take 1 tablet (30 mg total) by mouth daily.    Dispense:  30 tablet    Refill:  11  . zolpidem (AMBIEN) 5 MG tablet    Sig: Take 1 tablet (5 mg total) by mouth at bedtime as needed for sleep.    Dispense:  30 tablet    Refill:  2    Follow-up: Return in about 6 months (around 08/04/2016).   Boykin Nearing MD

## 2016-02-02 NOTE — Patient Instructions (Addendum)
Rhonda Graves was seen today for hypertension.  Diagnoses and all orders for this visit:  Worries  Essential hypertension, benign -     NIFEdipine (PROCARDIA-XL/ADALAT-CC/NIFEDICAL-XL) 30 MG 24 hr tablet; Take 1 tablet (30 mg total) by mouth daily. -     COMPLETE METABOLIC PANEL WITH GFR -     CBC  Environmental allergies -     EPINEPHrine 0.3 mg/0.3 mL IJ SOAJ injection; Inject 0.3 mLs (0.3 mg total) into the muscle once. -     Cetirizine HCl (ZYRTEC ALLERGY) 10 MG CAPS; Take 1 capsule (10 mg total) by mouth daily.  Insomnia -     zolpidem (AMBIEN) 5 MG tablet; Take 1 tablet (5 mg total) by mouth at bedtime as needed for sleep.   F/u in 6 months, sooner if needed for HTN  Dr. Adrian Blackwater

## 2016-02-02 NOTE — Assessment & Plan Note (Signed)
Insomnia Ambien 5 mg prn

## 2016-02-02 NOTE — Progress Notes (Signed)
F/U HTN  No pain today  No tobacco user  No suicidal thoughts in the past two weeks

## 2016-02-02 NOTE — Assessment & Plan Note (Signed)
Well controlled Continue procardia

## 2016-02-02 NOTE — Assessment & Plan Note (Signed)
Chronic worry  Stress relief

## 2016-02-12 ENCOUNTER — Other Ambulatory Visit: Payer: Self-pay | Admitting: Obstetrics & Gynecology

## 2016-02-12 DIAGNOSIS — B3731 Acute candidiasis of vulva and vagina: Secondary | ICD-10-CM

## 2016-02-12 DIAGNOSIS — B373 Candidiasis of vulva and vagina: Secondary | ICD-10-CM

## 2016-02-12 MED ORDER — FLUCONAZOLE 150 MG PO TABS: 150.0000 mg | ORAL_TABLET | Freq: Once | ORAL | Status: DC

## 2016-02-21 MED FILL — EPINEPHRINE 0.3 MG AUTO-INJ: 0.3 | 2 days supply | Qty: 2 | Fill #0

## 2016-02-22 ENCOUNTER — Ambulatory Visit: Payer: Medicaid Other | Admitting: Obstetrics and Gynecology

## 2016-03-14 ENCOUNTER — Other Ambulatory Visit: Payer: Self-pay | Admitting: Family Medicine

## 2016-03-14 MED ORDER — PRENATAL VITAMINS 0.8 MG PO TABS: 1.0000 | ORAL_TABLET | Freq: Every day | ORAL | Status: DC

## 2016-03-14 MED FILL — NIFEDIPINE ER 30 MG TABLET: 30 | 30 days supply | Qty: 30 | Fill #1

## 2016-04-05 ENCOUNTER — Other Ambulatory Visit: Payer: Self-pay | Admitting: Family Medicine

## 2016-04-05 DIAGNOSIS — N979 Female infertility, unspecified: Secondary | ICD-10-CM | POA: Insufficient documentation

## 2016-04-05 NOTE — Assessment & Plan Note (Signed)
Attempting to conceive x 1 year with husband who is age 34 Referred to Reproductive Endocrinology at [facility]

## 2016-04-08 ENCOUNTER — Telehealth: Payer: Self-pay | Admitting: General Practice

## 2016-04-08 NOTE — Telephone Encounter (Signed)
Opened in error

## 2016-04-10 ENCOUNTER — Other Ambulatory Visit: Payer: Self-pay | Admitting: General Practice

## 2016-04-10 DIAGNOSIS — B379 Candidiasis, unspecified: Secondary | ICD-10-CM

## 2016-04-10 MED ORDER — FLUCONAZOLE 150 MG PO TABS: 150.0000 mg | ORAL_TABLET | Freq: Once | ORAL | Status: DC

## 2016-04-10 MED FILL — NIFEDIPINE ER 30 MG TABLET: 30 | 30 days supply | Qty: 30 | Fill #2

## 2016-04-10 NOTE — Progress Notes (Unsigned)
Patient called into front office stating she has a horrible yeast infection with discharge and itching. Asked patient about previous prescription from May that included multiple refills. Patient states she used all those refills trying to eliminate the yeast infection in May and has no refills remaining. Told her we will send in one dose this time but next time she will need to come in for an exam. Patient verbalized understanding & had no questions

## 2016-04-29 MED FILL — ?CETIRIZINE HCL 10 MG TABLE: 10 | 30 days supply | Qty: 30 | Fill #1

## 2016-05-14 MED FILL — NIFEDIPINE ER 30 MG TABLET: 30 | 30 days supply | Qty: 30 | Fill #3

## 2016-05-21 MED FILL — ?CETIRIZINE HCL 10 MG TABLE: 10 | 30 days supply | Qty: 30 | Fill #2

## 2016-06-05 ENCOUNTER — Ambulatory Visit: Payer: Medicaid Other | Attending: Internal Medicine

## 2016-06-07 MED FILL — NIFEDIPINE ER 30 MG TABLET: 30 | 30 days supply | Qty: 30 | Fill #4

## 2016-06-19 ENCOUNTER — Ambulatory Visit: Payer: Self-pay | Attending: Internal Medicine | Admitting: Pharmacist

## 2016-06-19 ENCOUNTER — Ambulatory Visit: Payer: Medicaid Other | Admitting: Pharmacist

## 2016-06-19 DIAGNOSIS — Z23 Encounter for immunization: Secondary | ICD-10-CM

## 2016-06-19 NOTE — Patient Instructions (Signed)
Thanks for coming to see [place]!  Influenza received today:  Fluarix Quadrivalent Administered: 06/19/16 NDC: [id number] Lot #: [id number] Expiration date: 02/27/2017

## 2016-07-08 MED FILL — NIFEDIPINE ER 30 MG TABLET: 30 | 30 days supply | Qty: 30 | Fill #5

## 2016-07-19 MED FILL — LETROZOLE 2.5 MG TABLET: 2.5 | 6 days supply | Qty: 10 | Fill #0

## 2016-08-07 MED FILL — NIFEDIPINE ER 30 MG TABLET: 30 | 30 days supply | Qty: 30 | Fill #6

## 2016-08-21 MED FILL — LETROZOLE 2.5 MG TABLET: 2.5 | 5 days supply | Qty: 10 | Fill #0

## 2016-08-30 ENCOUNTER — Encounter: Payer: Self-pay | Admitting: Family Medicine

## 2016-08-30 ENCOUNTER — Ambulatory Visit: Payer: Self-pay | Attending: Family Medicine | Admitting: Family Medicine

## 2016-08-30 VITALS — BP 161/95 | HR 69 | Temp 98.3°F | Ht 61.0 in | Wt 136.2 lb

## 2016-08-30 DIAGNOSIS — Z79899 Other long term (current) drug therapy: Secondary | ICD-10-CM | POA: Insufficient documentation

## 2016-08-30 DIAGNOSIS — G4700 Insomnia, unspecified: Secondary | ICD-10-CM | POA: Insufficient documentation

## 2016-08-30 DIAGNOSIS — Z9109 Other allergy status, other than to drugs and biological substances: Secondary | ICD-10-CM

## 2016-08-30 DIAGNOSIS — I10 Essential (primary) hypertension: Secondary | ICD-10-CM | POA: Insufficient documentation

## 2016-08-30 DIAGNOSIS — N979 Female infertility, unspecified: Secondary | ICD-10-CM | POA: Insufficient documentation

## 2016-08-30 MED ORDER — ZOLPIDEM TARTRATE 5 MG PO TABS
5.0000 mg | ORAL_TABLET | Freq: Every evening | ORAL | 3 refills | Status: DC | PRN
Start: 2016-08-30 — End: 2016-12-26

## 2016-08-30 MED ORDER — ZOLPIDEM TARTRATE 5 MG PO TABS: 5.0000 mg | ORAL_TABLET | Freq: Every evening | ORAL | 2 refills | Status: DC | PRN

## 2016-08-30 MED ORDER — EPINEPHRINE 0.3 MG/0.3ML IJ SOAJ: 0.3000 mg | Freq: Once | INTRAMUSCULAR | 2 refills | Status: AC

## 2016-08-30 NOTE — Patient Instructions (Addendum)
Rhonda Graves was seen today for hypertension.  Diagnoses and all orders for this visit:  Essential hypertension, benign  Insomnia, unspecified type -     Discontinue: zolpidem (AMBIEN) 5 MG tablet; Take 1 tablet (5 mg total) by mouth at bedtime as needed for sleep. -     zolpidem (AMBIEN) 5 MG tablet; Take 1 tablet (5 mg total) by mouth at bedtime as needed for sleep.  Environmental allergies -     EPINEPHrine 0.3 mg/0.3 mL IJ SOAJ injection; Inject 0.3 mLs (0.3 mg total) into the muscle once.   If you stop procardia monitor BP daily for 2 weeks and restart if BP > 130/80   f/u in 6 months for HTN   Dr. Adrian Blackwater

## 2016-08-30 NOTE — Progress Notes (Signed)
Subjective:  Patient ID: Rhonda Graves, female    DOB: 12-13-1980  Age: 35 y.o. MRN: [id number]  CC: Hypertension   HPI Rhonda Graves presents for   1. CHRONIC HYPERTENSION  Disease Monitoring  Blood pressure range: 110-120/70-80 at home   Chest pain: no   Dyspnea: no   Claudication: no   Medication compliance: yes, nifedipine. Since her BP at home is WNL she would like to try to stop nifedipine.  Medication Side Effects  Lightheadedness: no   Urinary frequency: no   Edema: no   Impotence: no   Preventitive Healthcare:  Exercise: yes   2. Infertility: she is working with a fertility specialist   Social History  Substance Use Topics  . Smoking status: Never Smoker  . Smokeless tobacco: Never Used  . Alcohol use Yes     Comment: Not currently drinking    Outpatient Medications Prior to Visit  Medication Sig Dispense Refill  . Cetirizine HCl (ZYRTEC ALLERGY) 10 MG CAPS Take 1 capsule (10 mg total) by mouth daily. 30 capsule 11  . EPINEPHrine 0.3 mg/0.3 mL IJ SOAJ injection Inject 0.3 mLs (0.3 mg total) into the muscle once. 1 Device 2  . NIFEdipine (PROCARDIA-XL/ADALAT-CC/NIFEDICAL-XL) 30 MG 24 hr tablet Take 1 tablet (30 mg total) by mouth daily. 30 tablet 11  . Prenatal Multivit-Min-Fe-FA (PRENATAL VITAMINS) 0.8 MG tablet Take 1 tablet by mouth daily. 30 tablet 12  . zolpidem (AMBIEN) 5 MG tablet Take 1 tablet (5 mg total) by mouth at bedtime as needed for sleep. 30 tablet 2  . acetaminophen (TYLENOL) 500 MG tablet Take 500 mg by mouth every 6 (six) hours as needed. Reported on 02/02/2016    . diclofenac (VOLTAREN) 75 MG EC tablet Take 1 tablet (75 mg total) by mouth 2 (two) times daily. (Patient not taking: Reported on 08/30/2016) 50 tablet 2  . fluconazole (DIFLUCAN) 150 MG tablet Take 1 tablet (150 mg total) by mouth once. (Patient not taking: Reported on 08/30/2016) 1 tablet 0  . Simethicone 125 MG CAPS Take by mouth.     No facility-administered medications prior to  visit.     ROS Review of Systems  Constitutional: Negative for chills and fever.  Eyes: Negative for visual disturbance.  Respiratory: Negative for shortness of breath.   Cardiovascular: Negative for chest pain.  Gastrointestinal: Negative for abdominal pain and blood in stool.  Musculoskeletal: Negative for arthralgias and back pain.  Skin: Negative for rash.  Allergic/Immunologic: Negative for immunocompromised state.  Neurological: Negative for tremors.  Hematological: Negative for adenopathy. Bruises/bleeds easily.  Psychiatric/Behavioral: Positive for sleep disturbance. Negative for dysphoric mood and suicidal ideas. The patient is nervous/anxious.     Objective:  BP (!) 161/95 (BP Location: Right Arm, Patient Position: Sitting, Cuff Size: Small)   Pulse 69   Temp 98.3 F (36.8 C) (Oral)   Ht 5\' 1"  (1.549 m)   Wt 136 lb 3.2 oz (61.8 kg)   LMP 08/20/2016   SpO2 99%   BMI 25.73 kg/m   BP/Weight 08/30/2016 123456 [id number]  Systolic BP Q000111Q 0000000 Q000111Q  Diastolic BP 95 87 85  Wt. (Lbs) 136.2 137 140  BMI 25.73 25.9 26.47   Pulse Readings from Last 3 Encounters:  08/30/16 69  02/02/16 65  05/01/15 (!) 54   Physical Exam  Constitutional: She is oriented to person, place, and time. She appears well-developed and well-nourished. No distress.  HENT:  Head: Normocephalic and atraumatic.  Cardiovascular: Normal rate, regular rhythm, normal  heart sounds and intact distal pulses.   Pulmonary/Chest: Effort normal and breath sounds normal.  Musculoskeletal: She exhibits no edema.  Neurological: She is alert and oriented to person, place, and time.  Skin: Skin is warm and dry. No rash noted.  Psychiatric: She has a normal mood and affect.    Assessment & Plan:   There are no diagnoses linked to this encounter. Rhonda Graves was seen today for hypertension.  Diagnoses and all orders for this visit:  Essential hypertension, benign  Insomnia, unspecified type -     Discontinue:  zolpidem (AMBIEN) 5 MG tablet; Take 1 tablet (5 mg total) by mouth at bedtime as needed for sleep. -     zolpidem (AMBIEN) 5 MG tablet; Take 1 tablet (5 mg total) by mouth at bedtime as needed for sleep.  Environmental allergies -     EPINEPHrine 0.3 mg/0.3 mL IJ SOAJ injection; Inject 0.3 mLs (0.3 mg total) into the muscle once.    No orders of the defined types were placed in this encounter.   Follow-up: Return in about 6 months (around 02/28/2017) for HTN .   Boykin Nearing MD

## 2016-08-30 NOTE — Progress Notes (Signed)
Pt is here to follow up on HTN and medication refills.  Refills on ambien, epi pen

## 2016-09-02 NOTE — Assessment & Plan Note (Signed)
Elevated today Well controlled at home per patient report Continue nifedipine If she decides to stop and monitor, I advised monitoring every day for at least two weeks and restart of home BP > 130/80

## 2016-09-04 ENCOUNTER — Ambulatory Visit: Payer: Self-pay | Attending: Internal Medicine

## 2016-09-18 MED FILL — LETROZOLE 2.5 MG TABLET: 2.5 | 5 days supply | Qty: 10 | Fill #1

## 2016-09-19 ENCOUNTER — Telehealth: Payer: Self-pay | Admitting: *Deleted

## 2016-09-19 DIAGNOSIS — R070 Pain in throat: Secondary | ICD-10-CM

## 2016-09-19 DIAGNOSIS — J028 Acute pharyngitis due to other specified organisms: Secondary | ICD-10-CM

## 2016-09-19 NOTE — Telephone Encounter (Signed)
Pt advised to make appointment for tomorrow or f/u with Urgent care if pain and sx's persist or does not feel better. No appointments available with provider today. Carilyn Goodpasture,  RN

## 2016-09-19 NOTE — Telephone Encounter (Signed)
Pt arrived to [facility] c/o ear itching, sore throat, pain with swallowing, and nasal congestion, and productive cough. Symptoms onset Saturday. She has tried OTC Tussin, Ibuprofen, and has tried Afrin for 2 days. Strep test was negative in office. VS: 98.1 P: 54 R:16 SpO2: 98% BP:140/90

## 2016-09-20 MED ORDER — AMOXICILLIN 875 MG PO TABS: 875.0000 mg | ORAL_TABLET | Freq: Two times a day (BID) | ORAL | 0 refills | Status: DC

## 2016-09-20 NOTE — Telephone Encounter (Signed)
Patient is having headache, sore throat Sore throat is worsening  Cough and congestion  Symptoms for past week No improvement  Plan: amox sent to [facility] Continue supportive care   Patient agreed with plan and  Voiced understanding

## 2016-09-20 NOTE — Addendum Note (Signed)
Addended by: Boykin Nearing on: 09/20/2016 05:35 PM   Modules accepted: Orders

## 2016-09-26 ENCOUNTER — Other Ambulatory Visit: Payer: Self-pay

## 2016-09-26 MED ORDER — EPINEPHRINE 0.3 MG/0.3ML IJ SOAJ: 0.3000 mg | Freq: Once | INTRAMUSCULAR | 0 refills | Status: AC

## 2016-10-01 ENCOUNTER — Ambulatory Visit (HOSPITAL_COMMUNITY)
Admission: EM | Admit: 2016-10-01 | Discharge: 2016-10-01 | Disposition: A | Discharge status: Home / Self Care | Payer: Self-pay | Attending: Family Medicine | Admitting: Family Medicine

## 2016-10-01 ENCOUNTER — Encounter (HOSPITAL_COMMUNITY): Payer: Self-pay | Admitting: Family Medicine

## 2016-10-01 DIAGNOSIS — B9789 Other viral agents as the cause of diseases classified elsewhere: Secondary | ICD-10-CM

## 2016-10-01 DIAGNOSIS — Z79899 Other long term (current) drug therapy: Secondary | ICD-10-CM | POA: Insufficient documentation

## 2016-10-01 DIAGNOSIS — J069 Acute upper respiratory infection, unspecified: Secondary | ICD-10-CM | POA: Insufficient documentation

## 2016-10-01 DIAGNOSIS — R05 Cough: Secondary | ICD-10-CM | POA: Insufficient documentation

## 2016-10-01 LAB — POCT INFECTIOUS MONO SCREEN: MONO SCREEN: NEGATIVE

## 2016-10-01 LAB — POCT RAPID STREP A: Streptococcus, Group A Screen (Direct): NEGATIVE

## 2016-10-01 MED ORDER — BENZONATATE 100 MG PO CAPS: 100.0000 mg | ORAL_CAPSULE | Freq: Three times a day (TID) | ORAL | 0 refills | Status: DC

## 2016-10-01 MED FILL — NIFEDIPINE ER 30 MG TABLET: 30 | 30 days supply | Qty: 30 | Fill #7

## 2016-10-01 MED FILL — BENZONATATE 100 MG CAPSULE: 100 | 7 days supply | Qty: 21 | Fill #0

## 2016-10-01 NOTE — ED Triage Notes (Addendum)
Pt here for cough, sore throat over 2 weeks. sts that she has ben treated with amoxicillin and getting worse. Pt complaining of white patches on the right tonsil.

## 2016-10-01 NOTE — Discharge Instructions (Signed)
You have been tested for strep throat and for Mono, your strep test is negative but will be sent for culture. Should it be positive, a new antibiotic will be called in for you. You have been prescribed Tessalon for cough, take 3 times a day. Should your symptoms fail to resolve follow up with your primary care provider or return to clinic.

## 2016-10-01 NOTE — ED Provider Notes (Signed)
CSN: [id number]     Arrival date & time 10/01/16  1204 History   First MD Initiated Contact with Patient 10/01/16 1357     Chief Complaint  Patient presents with  . Sore Throat  . Cough   (Consider location/radiation/quality/duration/timing/severity/associated sxs/prior Treatment) 36 year old female presents to clinic with chief complaint of cough and sore throat, she reports she was seen 2 weeks ago and was treated for strep throat with Augmentin. She states her symptoms are somewhat better but not resolved. She continues to have sore throat, swollen lymphnodes, and cough. She does not have fever, no nausea, vomiting, diarrhea, or body aches. No sinus pain or tenderness and reports no other systemic symptoms      Past Medical History:  Diagnosis Date  . Abnormal Pap smear    had colpo in past, normal pap since then  . GERD (gastroesophageal reflux disease) Dx 2010  . Headache(784.0)    migraine  . Hypertension   . IBS (irritable bowel syndrome)   . Multiple allergies    Past Surgical History:  Procedure Laterality Date  . CESAREAN SECTION N/A 11/26/2013   Procedure: CESAREAN SECTION;  Surgeon: Lavonia Drafts, MD;  Location: [facility];  Service: Obstetrics;  Laterality: N/A;   Family History  Problem Relation Age of Onset  . Arthritis Mother   . Hypothyroidism Mother   . Asthma Brother    Social History  Substance Use Topics  . Smoking status: Never Smoker  . Smokeless tobacco: Never Used  . Alcohol use Yes     Comment: Not currently drinking   OB History    Gravida Para Term Preterm AB Living   1 1 1     1    SAB TAB Ectopic Multiple Live Births           1     Review of Systems  Reason unable to perform ROS: As covered in HPI.  All other systems reviewed and are negative.   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  Authorizing Provider  acetaminophen (TYLENOL) 500 MG tablet Take 500 mg by mouth every 6 (six) hours as needed. Reported on 02/02/2016    Historical Provider, MD  amoxicillin (AMOXIL) 875 MG tablet Take 1 tablet (875 mg total) by mouth 2 (two) times daily. 09/20/16   Josalyn Funches, MD  benzonatate (TESSALON) 100 MG capsule Take 1 capsule (100 mg total) by mouth every 8 (eight) hours. 10/01/16   Barnet Glasgow, NP  Cetirizine HCl (ZYRTEC ALLERGY) 10 MG CAPS Take 1 capsule (10 mg total) by mouth daily. 02/02/16   Josalyn Funches, MD  NIFEdipine (PROCARDIA-XL/ADALAT-CC/NIFEDICAL-XL) 30 MG 24 hr tablet Take 1 tablet (30 mg total) by mouth daily. 02/02/16   Boykin Nearing, MD  Prenatal Multivit-Min-Fe-FA (PRENATAL VITAMINS) 0.8 MG tablet Take 1 tablet by mouth daily. 03/14/16   Caren Macadam, MD  Simethicone 125 MG CAPS Take by mouth.    Historical Provider, MD  zolpidem (AMBIEN) 5 MG tablet Take 1 tablet (5 mg total) by mouth at bedtime as needed for sleep. 08/30/16   Boykin Nearing, MD   Meds Ordered and Administered this Visit  Medications - No data to display  BP 130/81   Pulse 65   Temp 98.5 F (36.9 C) (Oral)   Resp 18   LMP 09/17/2016   SpO2 100%  No data found.  Physical Exam  Constitutional: She is oriented to person, place, and time. She appears well-developed and well-nourished. No distress.  HENT:  Right Ear: Tympanic membrane and external ear normal.  Left Ear: Tympanic membrane and external ear normal.  Mouth/Throat: Oropharynx is clear and moist. No oropharyngeal exudate, posterior oropharyngeal edema, posterior oropharyngeal erythema or tonsillar abscesses. Tonsils are 1+ on the right. Tonsils are 2+ on the left.  Neck: Normal range of motion. Neck supple. No JVD present.  Cardiovascular: Normal rate and regular rhythm.   Pulmonary/Chest: Effort normal.  Abdominal: Soft. Bowel sounds are normal.  Lymphadenopathy:       Head (right side): Submandibular and  tonsillar adenopathy present.       Head (left side): Submandibular and tonsillar adenopathy present.    She has cervical adenopathy.  Neurological: She is alert and oriented to person, place, and time.  Skin: Skin is warm and dry. Capillary refill takes less than 2 seconds. She is not diaphoretic.  Psychiatric: She has a normal mood and affect.  Nursing note and vitals reviewed.   Urgent Care Course   Clinical Course     Procedures (including critical care time)  Labs Review Labs Reviewed  POCT RAPID STREP A    Imaging Review No results found.   Visual Acuity Review  Right Eye Distance:   Left Eye Distance:   Bilateral Distance:    Right Eye Near:   Left Eye Near:    Bilateral Near:         MDM   1. Viral URI with cough    You have been tested for strep throat and for Mono, your strep test is negative but will be sent for culture. Should it be positive, a new antibiotic will be called in for you. You have been prescribed Tessalon for cough, take 3 times a day. Should youre symptoms fail to resolve follow up with your primary care provider or return to clinic.     Barnet Glasgow, NP 10/01/16 1432

## 2016-10-04 ENCOUNTER — Telehealth: Payer: Self-pay | Admitting: Family Medicine

## 2016-10-04 LAB — CULTURE, GROUP A STREP (THRC)

## 2016-10-04 NOTE — Telephone Encounter (Signed)
Agree with recommendations.  

## 2016-10-04 NOTE — Telephone Encounter (Signed)
Pt states she has cough, congestion, body aches. Feels like she has a flu. Wanted to know if a test for flu and medication would be   Informed patient that taking OTC Ibuprofen and Tylenol for pain, Flonase for  nasal congestion, and Tessalon Pear[place] as recommended by provider at Urgent Care would help alleviate symptoms.  Encouraged to rests and drink plenty of fluids. Should call back If symptoms continued by Tuesday 10/08/16. Pt verbalized understanding.

## 2016-10-04 NOTE — Telephone Encounter (Signed)
Pt calling stating she was prescribed antibiotics by PCP a week or so ago and finished the last pill either Sunday or Monday of this week Pt states that she is still experiencing the same Sx as well as new sx. She states she has white spots on her tongue, body aches, a dry cough, chills, and congestion  She has taken ibuprofen but states the symptoms are not going away   CB #: [phone]

## 2016-10-16 MED FILL — LETROZOLE 2.5 MG TABLET: 2.5 | 5 days supply | Qty: 10 | Fill #2

## 2016-11-11 MED FILL — NIFEDIPINE ER 30 MG TABLET: 30 | 30 days supply | Qty: 30 | Fill #8

## 2016-11-25 MED FILL — $Epipen 2-pak: 2 days supply | Qty: 2 | Fill #1

## 2016-12-13 MED FILL — NIFEDIPINE ER 30 MG TABLET: 30 | 30 days supply | Qty: 30 | Fill #9

## 2016-12-16 ENCOUNTER — Ambulatory Visit (INDEPENDENT_AMBULATORY_CARE_PROVIDER_SITE_OTHER): Payer: Medicaid Other

## 2016-12-16 ENCOUNTER — Encounter: Payer: Self-pay | Admitting: Family Medicine

## 2016-12-16 DIAGNOSIS — Z3201 Encounter for pregnancy test, result positive: Secondary | ICD-10-CM | POA: Diagnosis not present

## 2016-12-16 DIAGNOSIS — Z3A10 10 weeks gestation of pregnancy: Secondary | ICD-10-CM

## 2016-12-16 LAB — POCT PREGNANCY, URINE: Preg Test, Ur: POSITIVE — AB

## 2016-12-16 NOTE — Progress Notes (Signed)
Patient presented to office today for pregnancy test. Test confirms she is pregnant. Patient is requesting a bhcg level because she is unsure of her last period. Ok per Dr.Stinson to go ahead and order. Patient medications were reviewed. I advise patient to stop taking Ambien since she is expecting now. I have given her a list of medication that is safe in pregnancy. Patient plans to start prenatal care in our office.

## 2016-12-17 LAB — BETA HCG QUANT (REF LAB): hCG Quant: 519 m[IU]/mL

## 2016-12-25 ENCOUNTER — Ambulatory Visit: Payer: Self-pay

## 2016-12-26 ENCOUNTER — Emergency Department (HOSPITAL_COMMUNITY): Payer: No Typology Code available for payment source

## 2016-12-26 ENCOUNTER — Encounter (HOSPITAL_COMMUNITY): Payer: Self-pay

## 2016-12-26 ENCOUNTER — Emergency Department (HOSPITAL_COMMUNITY)
Admission: EM | Admit: 2016-12-26 | Discharge: 2016-12-26 | Disposition: A | Discharge status: Home / Self Care | Payer: No Typology Code available for payment source | Attending: Emergency Medicine | Admitting: Emergency Medicine

## 2016-12-26 DIAGNOSIS — Y939 Activity, unspecified: Secondary | ICD-10-CM | POA: Insufficient documentation

## 2016-12-26 DIAGNOSIS — Y92410 Unspecified street and highway as the place of occurrence of the external cause: Secondary | ICD-10-CM | POA: Insufficient documentation

## 2016-12-26 DIAGNOSIS — Z79899 Other long term (current) drug therapy: Secondary | ICD-10-CM | POA: Diagnosis not present

## 2016-12-26 DIAGNOSIS — Z91010 Allergy to peanuts: Secondary | ICD-10-CM | POA: Insufficient documentation

## 2016-12-26 DIAGNOSIS — I10 Essential (primary) hypertension: Secondary | ICD-10-CM | POA: Diagnosis not present

## 2016-12-26 DIAGNOSIS — S59902A Unspecified injury of left elbow, initial encounter: Secondary | ICD-10-CM | POA: Diagnosis present

## 2016-12-26 DIAGNOSIS — Y999 Unspecified external cause status: Secondary | ICD-10-CM | POA: Diagnosis not present

## 2016-12-26 DIAGNOSIS — M25522 Pain in left elbow: Secondary | ICD-10-CM

## 2016-12-26 DIAGNOSIS — S40812A Abrasion of left upper arm, initial encounter: Secondary | ICD-10-CM | POA: Insufficient documentation

## 2016-12-26 MED ORDER — ACETAMINOPHEN 325 MG PO TABS: 650.0000 mg | ORAL_TABLET | Freq: Four times a day (QID) | ORAL | 0 refills | Status: DC | PRN

## 2016-12-26 MED ORDER — ACETAMINOPHEN 325 MG PO TABS
650.0000 mg | ORAL_TABLET | Freq: Once | ORAL | Status: DC
Start: 2016-12-26 — End: 2016-12-27

## 2016-12-26 MED ORDER — SILVER SULFADIAZINE 1 % EX CREA
TOPICAL_CREAM | Freq: Once | CUTANEOUS | Status: AC
  Administered 2016-12-26: 20:00:00 via TOPICAL
  Filled 2016-12-26: qty 85

## 2016-12-26 NOTE — ED Notes (Signed)
ED Provider at bedside. 
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
## 2017-01-02 ENCOUNTER — Encounter: Payer: Medicaid Other | Admitting: Advanced Practice Midwife

## 2017-01-06 ENCOUNTER — Ambulatory Visit: Payer: Self-pay

## 2017-01-06 ENCOUNTER — Other Ambulatory Visit (HOSPITAL_COMMUNITY)
Admission: RE | Admit: 2017-01-06 | Discharge: 2017-01-06 | Disposition: A | Discharge status: Home / Self Care | Payer: Medicaid Other | Source: Ambulatory Visit | Attending: Family Medicine | Admitting: Family Medicine

## 2017-01-06 ENCOUNTER — Encounter: Payer: Self-pay | Admitting: Family Medicine

## 2017-01-06 ENCOUNTER — Ambulatory Visit (INDEPENDENT_AMBULATORY_CARE_PROVIDER_SITE_OTHER): Payer: Medicaid Other | Admitting: Family Medicine

## 2017-01-06 VITALS — BP 122/78 | HR 61 | Wt 137.5 lb

## 2017-01-06 DIAGNOSIS — Z113 Encounter for screening for infections with a predominantly sexual mode of transmission: Secondary | ICD-10-CM

## 2017-01-06 DIAGNOSIS — O10919 Unspecified pre-existing hypertension complicating pregnancy, unspecified trimester: Secondary | ICD-10-CM

## 2017-01-06 DIAGNOSIS — Z3A01 Less than 8 weeks gestation of pregnancy: Secondary | ICD-10-CM | POA: Diagnosis not present

## 2017-01-06 DIAGNOSIS — N979 Female infertility, unspecified: Secondary | ICD-10-CM | POA: Diagnosis not present

## 2017-01-06 DIAGNOSIS — O10911 Unspecified pre-existing hypertension complicating pregnancy, first trimester: Secondary | ICD-10-CM | POA: Diagnosis not present

## 2017-01-06 DIAGNOSIS — O0990 Supervision of high risk pregnancy, unspecified, unspecified trimester: Secondary | ICD-10-CM

## 2017-01-06 DIAGNOSIS — O0991 Supervision of high risk pregnancy, unspecified, first trimester: Secondary | ICD-10-CM | POA: Insufficient documentation

## 2017-01-06 DIAGNOSIS — O3680X0 Pregnancy with inconclusive fetal viability, not applicable or unspecified: Secondary | ICD-10-CM

## 2017-01-06 DIAGNOSIS — F5101 Primary insomnia: Secondary | ICD-10-CM

## 2017-01-06 DIAGNOSIS — Z1151 Encounter for screening for human papillomavirus (HPV): Secondary | ICD-10-CM

## 2017-01-06 DIAGNOSIS — Z124 Encounter for screening for malignant neoplasm of cervix: Secondary | ICD-10-CM | POA: Diagnosis not present

## 2017-01-06 DIAGNOSIS — K580 Irritable bowel syndrome with diarrhea: Secondary | ICD-10-CM

## 2017-01-06 HISTORY — DX: Supervision of high risk pregnancy, unspecified, unspecified trimester: O09.90

## 2017-01-06 NOTE — Progress Notes (Signed)
Initial prenatal education packet given Breastfeeding education done Schedule first trimester screen Initial prenatal labs
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
## 2017-01-06 NOTE — Progress Notes (Addendum)
Pt informed that the ultrasound is considered a limited OB ultrasound and is not intended to be a complete ultrasound exam.  Patient also informed that the ultrasound is not being completed with the intent of assessing for fetal or placental anomalies or any pelvic abnormalities.  Explained that the purpose of today's ultrasound is to assess for viability.  Patient acknowledges the purpose of the exam and the limitations of the study.    Single IUP FHR - 152 bpm per M- mode Dr. Nehemiah Settle informed

## 2017-01-08 LAB — COMPREHENSIVE METABOLIC PANEL
A/G RATIO: 1.8 (ref 1.2–2.2)
ALBUMIN: 4.4 g/dL (ref 3.5–5.5)
ALK PHOS: 69 IU/L (ref 39–117)
ALT: 20 IU/L (ref 0–32)
AST: 21 IU/L (ref 0–40)
BILIRUBIN TOTAL: 0.2 mg/dL (ref 0.0–1.2)
BUN / CREAT RATIO: 23 (ref 9–23)
BUN: 12 mg/dL (ref 6–20)
CHLORIDE: 100 mmol/L (ref 96–106)
CO2: 22 mmol/L (ref 18–29)
Calcium: 9.2 mg/dL (ref 8.7–10.2)
Creatinine, Ser: 0.53 mg/dL — ABNORMAL LOW (ref 0.57–1.00)
GFR calc non Af Amer: 124 mL/min/{1.73_m2} (ref >59)
GFR, EST AFRICAN AMERICAN: 143 mL/min/{1.73_m2} (ref >59)
GLOBULIN, TOTAL: 2.5 g/dL (ref 1.5–4.5)
Glucose: 81 mg/dL (ref 65–99)
Potassium: 3.8 mmol/L (ref 3.5–5.2)
SODIUM: 138 mmol/L (ref 134–144)
TOTAL PROTEIN: 6.9 g/dL (ref 6.0–8.5)

## 2017-01-08 LAB — OBSTETRIC PANEL, INCLUDING HIV
Antibody Screen: NEGATIVE
BASOS ABS: 0 10*3/uL (ref 0.0–0.2)
Basos: 0 %
EOS (ABSOLUTE): 0.1 10*3/uL (ref 0.0–0.4)
EOS: 1 %
HEMOGLOBIN: 12.5 g/dL (ref 11.1–15.9)
HEP B S AG: NEGATIVE
HIV SCREEN 4TH GENERATION: NONREACTIVE
Hematocrit: 37 % (ref 34.0–46.6)
IMMATURE GRANS (ABS): 0 10*3/uL (ref 0.0–0.1)
IMMATURE GRANULOCYTES: 0 %
LYMPHS ABS: 2.4 10*3/uL (ref 0.7–3.1)
LYMPHS: 26 %
MCH: 31 pg (ref 26.6–33.0)
MCHC: 33.8 g/dL (ref 31.5–35.7)
MCV: 92 fL (ref 79–97)
MONOS ABS: 0.9 10*3/uL (ref 0.1–0.9)
Monocytes: 10 %
NEUTROS PCT: 63 %
Neutrophils Absolute: 5.7 10*3/uL (ref 1.4–7.0)
PLATELETS: 302 10*3/uL (ref 150–379)
RBC: 4.03 x10E6/uL (ref 3.77–5.28)
RDW: 13 % (ref 12.3–15.4)
RPR: NONREACTIVE
Rh Factor: POSITIVE
Rubella Antibodies, IGG: 4.57 index (ref >0.99)
WBC: 9.1 10*3/uL (ref 3.4–10.8)

## 2017-01-08 LAB — HEMOGLOBINOPATHY EVALUATION
FERRITIN: 121 ng/mL (ref 15–150)
HGB S: 0 %
HGB SOLUBILITY: NEGATIVE
HGB VARIANT: 0 %
Hgb A2 Quant: 2.8 % (ref 1.8–3.2)
Hgb A: 97.2 % (ref 96.4–98.8)
Hgb C: 0 %
Hgb F Quant: 0 % (ref 0.0–2.0)

## 2017-01-08 LAB — CYTOLOGY - PAP
CHLAMYDIA, DNA PROBE: NEGATIVE
Diagnosis: NEGATIVE
HPV: NOT DETECTED
NEISSERIA GONORRHEA: NEGATIVE

## 2017-01-08 LAB — PROTEIN / CREATININE RATIO, URINE
CREATININE, UR: 112.4 mg/dL
Protein, Ur: 11.4 mg/dL
Protein/Creat Ratio: 101 mg/g creat (ref 0–200)

## 2017-01-09 LAB — URINE CULTURE, OB REFLEX

## 2017-01-09 LAB — CULTURE, OB URINE

## 2017-01-15 ENCOUNTER — Ambulatory Visit (HOSPITAL_COMMUNITY): Payer: No Typology Code available for payment source

## 2017-01-15 MED FILL — NIFEDIPINE ER 30 MG TABLET: 30 | 30 days supply | Qty: 30 | Fill #10

## 2017-01-16 ENCOUNTER — Encounter: Payer: Self-pay | Admitting: *Deleted

## 2017-01-17 ENCOUNTER — Encounter: Payer: Self-pay | Admitting: Obstetrics & Gynecology

## 2017-02-05 ENCOUNTER — Ambulatory Visit (INDEPENDENT_AMBULATORY_CARE_PROVIDER_SITE_OTHER): Payer: Medicaid Other | Admitting: Family Medicine

## 2017-02-05 ENCOUNTER — Encounter: Payer: Self-pay | Admitting: Family Medicine

## 2017-02-05 VITALS — BP 138/82 | HR 58 | Wt 135.0 lb

## 2017-02-05 DIAGNOSIS — O0991 Supervision of high risk pregnancy, unspecified, first trimester: Secondary | ICD-10-CM | POA: Diagnosis not present

## 2017-02-05 DIAGNOSIS — O10911 Unspecified pre-existing hypertension complicating pregnancy, first trimester: Secondary | ICD-10-CM

## 2017-02-05 DIAGNOSIS — K580 Irritable bowel syndrome with diarrhea: Secondary | ICD-10-CM | POA: Diagnosis not present

## 2017-02-05 DIAGNOSIS — J069 Acute upper respiratory infection, unspecified: Secondary | ICD-10-CM | POA: Diagnosis not present

## 2017-02-05 DIAGNOSIS — O0990 Supervision of high risk pregnancy, unspecified, unspecified trimester: Secondary | ICD-10-CM

## 2017-02-05 DIAGNOSIS — B9789 Other viral agents as the cause of diseases classified elsewhere: Secondary | ICD-10-CM | POA: Diagnosis not present

## 2017-02-05 DIAGNOSIS — O10919 Unspecified pre-existing hypertension complicating pregnancy, unspecified trimester: Secondary | ICD-10-CM

## 2017-02-05 LAB — POCT URINALYSIS DIP (DEVICE)
Bilirubin Urine: NEGATIVE
Glucose, UA: NEGATIVE mg/dL
Ketones, ur: NEGATIVE mg/dL
Leukocytes, UA: NEGATIVE
NITRITE: NEGATIVE
PH: 5.5 (ref 5.0–8.0)
Protein, ur: NEGATIVE mg/dL
Specific Gravity, Urine: 1.01 (ref 1.005–1.030)
UROBILINOGEN UA: 0.2 mg/dL (ref 0.0–1.0)

## 2017-02-05 MED ORDER — T.E.D. THIGH LENGTH/S-SHORT MISC: 1.0000 [IU] | Freq: Once | 0 refills | Status: AC

## 2017-02-05 MED ORDER — COMPLETENATE 29-1 MG PO CHEW: 1.0000 | CHEWABLE_TABLET | Freq: Every day | ORAL | Status: DC

## 2017-02-05 MED ORDER — T.E.D. BELOW KNEE/SMALL MISC: 1.0000 [IU] | Freq: Once | 0 refills | Status: AC

## 2017-02-05 NOTE — Progress Notes (Signed)
   PRENATAL VISIT NOTE  Subjective:  Rhonda Graves is a 36 y.o. G2P1001 at [redacted]w[redacted]d being seen today for ongoing prenatal care.  She is currently monitored for the following issues for this high-risk pregnancy and has IBS (irritable bowel syndrome); Chronic hypertension in pregnancy; Migraine; Foot pain, bilateral; Nevus of multiple sites of trunk; Worries; Environmental allergies; Insomnia; Female fertility problem; and Supervision of high risk pregnancy, antepartum on her problem list.  Patient reports nasal congestion with sore throat x5days. Also having some cough..  Contractions: Not present. Vag. Bleeding: None.  Movement: Absent. Denies leaking of fluid.   The following portions of the patient's history were reviewed and updated as appropriate: allergies, current medications, past family history, past medical history, past social history, past surgical history and problem list. Problem list updated.  Objective:   Vitals:   02/05/17 1316  BP: 138/82  Pulse: (!) 58  Weight: 135 lb (61.2 kg)    Fetal Status: Fetal Heart Rate (bpm): 166   Movement: Absent     General:  Alert, oriented and cooperative. Patient is in no acute distress.  HEENT:  Cobble stoning in oropharynx. No erythema or purulence to tonsils. Submandibular LAD.  Skin: Skin is warm and dry. No rash noted.   Cardiovascular: Normal heart rate noted  Respiratory: Normal respiratory effort. Lungs CTA.   Abdomen: Soft, gravid, appropriate for gestational age. Pain/Pressure: Absent     Pelvic:  Cervical exam deferred        Extremities: Normal range of motion.  Edema: None  Mental Status: Normal mood and affect. Normal behavior. Normal judgment and thought content.   Assessment and Plan:  Pregnancy: G2P1001 at [redacted]w[redacted]d  1. Supervision of high risk pregnancy, antepartum FHT and FH normal  2. Chronic hypertension in pregnancy Continue procardia  3. Irritable bowel syndrome with diarrhea Referral to GI. - Ambulatory  referral to Gastroenterology  4. Viral URI with cough OTC symptomatic treatment. No evidence of strep  Preterm labor symptoms and general obstetric precautions including but not limited to vaginal bleeding, contractions, leaking of fluid and fetal movement were reviewed in detail with the patient. Please refer to After Visit Summary for other counseling recommendations.  No Follow-up on file.   Truett Mainland, DO

## 2017-02-06 ENCOUNTER — Other Ambulatory Visit: Payer: Self-pay | Admitting: Obstetrics & Gynecology

## 2017-02-06 MED ORDER — BENZONATATE 100 MG PO CAPS: 200.0000 mg | ORAL_CAPSULE | Freq: Three times a day (TID) | ORAL | 3 refills | Status: DC | PRN

## 2017-02-14 ENCOUNTER — Ambulatory Visit (HOSPITAL_COMMUNITY)
Admission: RE | Admit: 2017-02-14 | Discharge: 2017-02-14 | Disposition: A | Discharge status: Home / Self Care | Payer: Medicaid Other | Source: Ambulatory Visit | Attending: Family Medicine | Admitting: Family Medicine

## 2017-02-14 ENCOUNTER — Encounter (HOSPITAL_COMMUNITY): Payer: Self-pay

## 2017-02-14 ENCOUNTER — Other Ambulatory Visit: Payer: Self-pay | Admitting: Family Medicine

## 2017-02-14 DIAGNOSIS — Z3682 Encounter for antenatal screening for nuchal translucency: Secondary | ICD-10-CM

## 2017-02-14 DIAGNOSIS — Z3A12 12 weeks gestation of pregnancy: Secondary | ICD-10-CM | POA: Insufficient documentation

## 2017-02-14 DIAGNOSIS — O09521 Supervision of elderly multigravida, first trimester: Secondary | ICD-10-CM | POA: Diagnosis not present

## 2017-02-14 DIAGNOSIS — O09811 Supervision of pregnancy resulting from assisted reproductive technology, first trimester: Secondary | ICD-10-CM | POA: Diagnosis not present

## 2017-02-14 DIAGNOSIS — O10911 Unspecified pre-existing hypertension complicating pregnancy, first trimester: Secondary | ICD-10-CM

## 2017-02-14 DIAGNOSIS — O0990 Supervision of high risk pregnancy, unspecified, unspecified trimester: Secondary | ICD-10-CM

## 2017-02-19 ENCOUNTER — Other Ambulatory Visit: Payer: Self-pay | Admitting: Family Medicine

## 2017-02-19 ENCOUNTER — Telehealth: Payer: Self-pay | Admitting: General Practice

## 2017-02-19 DIAGNOSIS — I10 Essential (primary) hypertension: Secondary | ICD-10-CM

## 2017-02-19 MED ORDER — NIFEDIPINE ER OSMOTIC RELEASE 30 MG PO TB24: 30.0000 mg | ORAL_TABLET | Freq: Every day | ORAL | 11 refills | Status: DC

## 2017-02-19 NOTE — Telephone Encounter (Signed)
Patient called into front office requesting refill on procardia. Told patient we will send that in for her. Patient verbalized understanding & had no questions

## 2017-02-24 ENCOUNTER — Other Ambulatory Visit: Payer: Self-pay

## 2017-02-25 ENCOUNTER — Telehealth (HOSPITAL_COMMUNITY): Payer: Self-pay | Admitting: MS"

## 2017-02-25 NOTE — Telephone Encounter (Signed)
Called Rhonda Graves to discuss her prenatal cell free DNA test results.  [name] had Panorama testing through [facility].  Testing was offered because of maternal age.   The patient was identified by name and DOB.  We reviewed that these are within normal limits, showing a less than 1 in 10,000 risk for trisomies 21, 18 and 13, and monosomy X (Turner syndrome).  In addition, the risk for triploidy and sex chromosome trisomies (47,XXX and 47,XXY) was also low risk.  We reviewed that this testing identifies > 99% of pregnancies with trisomy 64, trisomy 2, sex chromosome trisomies (47,XXX and 47,XXY), and triploidy. The detection rate for trisomy 18 is 96%.  The detection rate for monosomy X is ~92%.  The false positive rate is <0.1% for all conditions. Testing was also consistent with female fetal sex.  The patient did wish to know fetal sex.  She understands that this testing does not identify all genetic conditions.  All questions were answered to her satisfaction, she was encouraged to call with additional questions or concerns.  Chipper Oman, MS Certified Genetic Counselor 02/25/2017 12:32 PM

## 2017-03-05 ENCOUNTER — Encounter: Payer: Medicaid Other | Admitting: Obstetrics and Gynecology

## 2017-03-06 ENCOUNTER — Ambulatory Visit (INDEPENDENT_AMBULATORY_CARE_PROVIDER_SITE_OTHER): Payer: Medicaid Other | Admitting: Obstetrics and Gynecology

## 2017-03-06 ENCOUNTER — Other Ambulatory Visit: Payer: Self-pay

## 2017-03-06 VITALS — BP 142/72 | HR 54 | Wt 140.0 lb

## 2017-03-06 DIAGNOSIS — O0990 Supervision of high risk pregnancy, unspecified, unspecified trimester: Secondary | ICD-10-CM

## 2017-03-06 DIAGNOSIS — O09522 Supervision of elderly multigravida, second trimester: Secondary | ICD-10-CM | POA: Insufficient documentation

## 2017-03-06 DIAGNOSIS — K580 Irritable bowel syndrome with diarrhea: Secondary | ICD-10-CM

## 2017-03-06 DIAGNOSIS — R001 Bradycardia, unspecified: Secondary | ICD-10-CM | POA: Insufficient documentation

## 2017-03-06 DIAGNOSIS — O10919 Unspecified pre-existing hypertension complicating pregnancy, unspecified trimester: Secondary | ICD-10-CM

## 2017-03-06 DIAGNOSIS — Z98891 History of uterine scar from previous surgery: Secondary | ICD-10-CM | POA: Insufficient documentation

## 2017-03-06 DIAGNOSIS — O0992 Supervision of high risk pregnancy, unspecified, second trimester: Secondary | ICD-10-CM | POA: Diagnosis not present

## 2017-03-06 DIAGNOSIS — Z3689 Encounter for other specified antenatal screening: Secondary | ICD-10-CM

## 2017-03-06 DIAGNOSIS — O10912 Unspecified pre-existing hypertension complicating pregnancy, second trimester: Secondary | ICD-10-CM | POA: Diagnosis not present

## 2017-03-06 HISTORY — DX: History of uterine scar from previous surgery: Z98.891

## 2017-03-06 HISTORY — DX: Supervision of elderly multigravida, second trimester: O09.522

## 2017-03-06 LAB — POCT URINALYSIS DIP (DEVICE)
Bilirubin Urine: NEGATIVE
GLUCOSE, UA: NEGATIVE mg/dL
Hgb urine dipstick: NEGATIVE
Ketones, ur: NEGATIVE mg/dL
Leukocytes, UA: NEGATIVE
NITRITE: NEGATIVE
Protein, ur: NEGATIVE mg/dL
SPECIFIC GRAVITY, URINE: 1.015 (ref 1.005–1.030)
UROBILINOGEN UA: 0.2 mg/dL (ref 0.0–1.0)
pH: 6.5 (ref 5.0–8.0)

## 2017-03-06 MED ORDER — DICYCLOMINE HCL 20 MG PO TABS: ORAL_TABLET | ORAL | 3 refills | Status: DC

## 2017-03-06 NOTE — Progress Notes (Signed)
Breastfeeding tip reviewed

## 2017-03-06 NOTE — Progress Notes (Signed)
Prenatal Visit Note Date: 03/06/2017 Clinic: [facility]  Subjective:  Rhonda Graves is a 36 y.o. G2P1001 at [redacted]w[redacted]d being seen today for ongoing prenatal care.  She is currently monitored for the following issues for this high-risk pregnancy and has IBS (irritable bowel syndrome); Chronic hypertension in pregnancy; Migraine; Foot pain, bilateral; Nevus of multiple sites of trunk; Worries; Environmental allergies; Insomnia; Female fertility problem; Supervision of high risk pregnancy, antepartum; AMA (advanced maternal age) multigravida 35+, second trimester; and History of cesarean delivery on her problem list.  Patient self d/c'ed her procardia 30mg  xl about a week ago b/c it made her feel tired and weak and her BPs at home were running in the 90s-120s SBP.   . Vag. Bleeding: None.  Movement: Present. Denies leaking of fluid.   The following portions of the patient's history were reviewed and updated as appropriate: allergies, current medications, past family history, past medical history, past social history, past surgical history and problem list. Problem list updated.  Objective:   Vitals:   03/06/17 1000  BP: (!) 142/72  Pulse: (!) 54  Weight: 140 lb (63.5 kg)    Fetal Status: Fetal Heart Rate (bpm): 150s   Movement: Present     General:  Alert, oriented and cooperative. Patient is in no acute distress.  Skin: Skin is warm and dry. No rash noted.   Cardiovascular: HR 50s No MRGs, normal s1 and s2  Respiratory: Normal respiratory effort, no problems with respiration noted. CTAB  Abdomen: Soft, gravid, appropriate for gestational age. Pain/Pressure: Present    Soft, nttp, well healed low transverse skin incision, no masses or hernias.   Pelvic:  Cervical exam deferred        Extremities: Normal range of motion.  Edema: None  Mental Status: Normal mood and affect. Normal behavior. Normal judgment and thought content.   Urinalysis: Urine Protein: Negative Urine  Glucose: Negative  Assessment and Plan:  Pregnancy: G2P1001 at [redacted]w[redacted]d  1. Supervision of high risk pregnancy, antepartum, second trimester Routine care. Pt amenable to afp, will scheduled anatomy u/s  - [place] MFM OB DETAIL +14 WK; Future - AFP, Serum, Open Spina Bifida  2. Chronic hypertension in pregnancy Pt states BPs are only ever up at her ROBs. She had one mild range BP earlier in pregnancy. I told her that since her BPs off the medications here are just in the slight mild range and normal at home okay to come off the procardia; pt told to let us know if BPs start to creep up and also told her that pregnancy usually lower until the 3rd trimester so will continue to monitor closely. Baseline TSH obtained.   4. Supervision of high risk pregnancy, antepartum See above  5. AMA (advanced maternal age) multigravida 23+, second trimester Seen by [facility]. Low risk panorama. F/u anatomy u/s. Will get a1c for early GDM screening. Prior cmp negative.   6. Sinus bradycardia Could be from the procardia although she's been off it for awhile. Asymptomatic. Will continue to follow. TSH obtained. If needs to be started on BP meds, likely hydralazine if still brady.  - EKG 12-Lead   7. History of cesarean delivery   8. Irritable bowel syndrome with diarrhea Mostly bloating and diarrhea s/s. D/w pt to try kefir and pt amenable to trying bentyl.   Preterm labor symptoms and general obstetric precautions including but not limited to vaginal bleeding, contractions, leaking of fluid and fetal movement were reviewed in detail with the patient.  Please refer to After Visit Summary for other counseling recommendations.  Return in about 3 weeks (around 03/27/2017) for 2-3wk rob.   Aletha Halim, MD

## 2017-03-06 NOTE — Patient Instructions (Signed)
Take 4-8oz of Kefir nightly   Start aspirin 81mg  orally every day

## 2017-03-09 LAB — AFP, SERUM, OPEN SPINA BIFIDA
AFP MoM: 0.95
AFP VALUE AFPOSL: 30.3 ng/mL
GEST. AGE ON COLLECTION DATE: 15.5 wk
MATERNAL AGE AT EDD: 35.4 a
OSBR Risk 1 IN: 10000
Test Results:: NEGATIVE
Weight: 140 [lb_av]

## 2017-03-09 LAB — TSH: TSH: 2.38 u[IU]/mL (ref 0.450–4.500)

## 2017-03-19 ENCOUNTER — Encounter: Payer: Self-pay | Admitting: General Practice

## 2017-03-27 ENCOUNTER — Ambulatory Visit (INDEPENDENT_AMBULATORY_CARE_PROVIDER_SITE_OTHER): Payer: Medicaid Other | Admitting: Family Medicine

## 2017-03-27 VITALS — BP 122/84 | HR 58 | Wt 144.3 lb

## 2017-03-27 DIAGNOSIS — M549 Dorsalgia, unspecified: Secondary | ICD-10-CM | POA: Diagnosis not present

## 2017-03-27 DIAGNOSIS — O10919 Unspecified pre-existing hypertension complicating pregnancy, unspecified trimester: Secondary | ICD-10-CM

## 2017-03-27 DIAGNOSIS — O0992 Supervision of high risk pregnancy, unspecified, second trimester: Secondary | ICD-10-CM | POA: Diagnosis not present

## 2017-03-27 DIAGNOSIS — O0990 Supervision of high risk pregnancy, unspecified, unspecified trimester: Secondary | ICD-10-CM

## 2017-03-27 DIAGNOSIS — O10912 Unspecified pre-existing hypertension complicating pregnancy, second trimester: Secondary | ICD-10-CM | POA: Diagnosis not present

## 2017-03-27 DIAGNOSIS — M9905 Segmental and somatic dysfunction of pelvic region: Secondary | ICD-10-CM

## 2017-03-27 DIAGNOSIS — O9989 Other specified diseases and conditions complicating pregnancy, childbirth and the puerperium: Secondary | ICD-10-CM

## 2017-03-27 DIAGNOSIS — O26892 Other specified pregnancy related conditions, second trimester: Secondary | ICD-10-CM

## 2017-03-27 LAB — POCT URINALYSIS DIP (DEVICE)
Bilirubin Urine: NEGATIVE
Glucose, UA: NEGATIVE mg/dL
HGB URINE DIPSTICK: NEGATIVE
KETONES UR: NEGATIVE mg/dL
Leukocytes, UA: NEGATIVE
Nitrite: NEGATIVE
PH: 6.5 (ref 5.0–8.0)
Protein, ur: NEGATIVE mg/dL
SPECIFIC GRAVITY, URINE: 1.02 (ref 1.005–1.030)
Urobilinogen, UA: 0.2 mg/dL (ref 0.0–1.0)

## 2017-03-27 NOTE — Progress Notes (Signed)
   PRENATAL VISIT NOTE  Subjective:  Rhonda Graves is a 36 y.o. G2P1001 at [redacted]w[redacted]d being seen today for ongoing prenatal care.  She is currently monitored for the following issues for this high-risk pregnancy and has IBS (irritable bowel syndrome); Chronic hypertension in pregnancy; Migraine; Foot pain, bilateral; Nevus of multiple sites of trunk; Worries; Environmental allergies; Insomnia; Female fertility problem; Supervision of high risk pregnancy, antepartum; AMA (advanced maternal age) multigravida 60+, second trimester; History of cesarean delivery; and Sinus bradycardia on her problem list.  Patient reports back pain in lumbar area. worse after sleeping. Describes as achy..   .  .  Movement: Present. Denies leaking of fluid.   The following portions of the patient's history were reviewed and updated as appropriate: allergies, current medications, past family history, past medical history, past social history, past surgical history and problem list. Problem list updated.  Objective:   Vitals:   03/27/17 1014 03/27/17 1043  BP: (!) 150/88 122/84  Pulse: (!) 58   Weight: 144 lb 4.8 oz (65.5 kg)     Fetal Status: Fetal Heart Rate (bpm): 156   Movement: Present     General:  Alert, oriented and cooperative. Patient is in no acute distress.  Skin: Skin is warm and dry. No rash noted.   Cardiovascular: Normal heart rate noted  Respiratory: Normal respiratory effort, no problems with respiration noted  Abdomen: Soft, gravid, appropriate for gestational age. Pain/Pressure: Present     Pelvic:  Cervical exam deferred        MSK: Restriction, tenderness, tissue texture changes, and paraspinal spasm in the left lumbar spine  Neuro: Moves all four extremities with no focal neurological deficit  Extremities: Normal range of motion.     Mental Status: Normal mood and affect. Normal behavior. Normal judgment and thought content.   OSE: Head   Cervical   Thoracic   Rib   Lumbar L5 ESRR    Sacrum L/L torsion  Pelvis Right ant innom    Assessment and Plan:  Pregnancy: G2P1001 at [redacted]w[redacted]d  1. Supervision of high risk pregnancy, antepartum FHT and FH normal  2.  Chronic hypertension in pregnancy BP stable off antihypertension  3. Back pain affecting pregnancy in second trimester 4. Somatic dysfunction of pelvis region OMT done after patient permission. HVLA technique utilized. Patient tolerated procedure well. Pelvic exercises taught.   Preterm labor symptoms and general obstetric precautions including but not limited to vaginal bleeding, contractions, leaking of fluid and fetal movement were reviewed in detail with the patient. Please refer to After Visit Summary for other counseling recommendations.  No Follow-up on file.  Truett Mainland, DO

## 2017-03-28 ENCOUNTER — Ambulatory Visit (HOSPITAL_COMMUNITY)
Admission: RE | Admit: 2017-03-28 | Discharge: 2017-03-28 | Disposition: A | Discharge status: Home / Self Care | Payer: Medicaid Other | Source: Ambulatory Visit | Attending: Obstetrics and Gynecology | Admitting: Obstetrics and Gynecology

## 2017-03-28 ENCOUNTER — Encounter (HOSPITAL_COMMUNITY): Payer: Self-pay

## 2017-03-28 DIAGNOSIS — Z3689 Encounter for other specified antenatal screening: Secondary | ICD-10-CM | POA: Diagnosis not present

## 2017-03-28 DIAGNOSIS — Z3A18 18 weeks gestation of pregnancy: Secondary | ICD-10-CM | POA: Diagnosis not present

## 2017-03-28 DIAGNOSIS — O0992 Supervision of high risk pregnancy, unspecified, second trimester: Secondary | ICD-10-CM | POA: Diagnosis present

## 2017-03-31 ENCOUNTER — Other Ambulatory Visit (HOSPITAL_COMMUNITY): Payer: Self-pay | Admitting: *Deleted

## 2017-03-31 DIAGNOSIS — IMO0002 Reserved for concepts with insufficient information to code with codable children: Secondary | ICD-10-CM

## 2017-03-31 DIAGNOSIS — Z0489 Encounter for examination and observation for other specified reasons: Secondary | ICD-10-CM

## 2017-04-02 ENCOUNTER — Encounter: Payer: Medicaid Other | Admitting: Obstetrics and Gynecology

## 2017-04-16 ENCOUNTER — Other Ambulatory Visit: Payer: Self-pay | Admitting: Obstetrics and Gynecology

## 2017-04-16 MED ORDER — VALACYCLOVIR HCL 1 G PO TABS: 1000.0000 mg | ORAL_TABLET | Freq: Two times a day (BID) | ORAL | 1 refills | Status: DC

## 2017-04-17 ENCOUNTER — Ambulatory Visit (INDEPENDENT_AMBULATORY_CARE_PROVIDER_SITE_OTHER): Payer: Medicaid Other | Admitting: Family Medicine

## 2017-04-17 VITALS — BP 135/76 | HR 68 | Wt 145.5 lb

## 2017-04-17 DIAGNOSIS — O09522 Supervision of elderly multigravida, second trimester: Secondary | ICD-10-CM

## 2017-04-17 DIAGNOSIS — O10912 Unspecified pre-existing hypertension complicating pregnancy, second trimester: Secondary | ICD-10-CM

## 2017-04-17 DIAGNOSIS — O10919 Unspecified pre-existing hypertension complicating pregnancy, unspecified trimester: Secondary | ICD-10-CM

## 2017-04-17 DIAGNOSIS — K121 Other forms of stomatitis: Secondary | ICD-10-CM

## 2017-04-17 DIAGNOSIS — O9989 Other specified diseases and conditions complicating pregnancy, childbirth and the puerperium: Secondary | ICD-10-CM

## 2017-04-17 DIAGNOSIS — M9905 Segmental and somatic dysfunction of pelvic region: Secondary | ICD-10-CM | POA: Diagnosis not present

## 2017-04-17 DIAGNOSIS — O26892 Other specified pregnancy related conditions, second trimester: Secondary | ICD-10-CM | POA: Diagnosis not present

## 2017-04-17 DIAGNOSIS — O99891 Other specified diseases and conditions complicating pregnancy: Secondary | ICD-10-CM

## 2017-04-17 DIAGNOSIS — O0990 Supervision of high risk pregnancy, unspecified, unspecified trimester: Secondary | ICD-10-CM

## 2017-04-17 DIAGNOSIS — M549 Dorsalgia, unspecified: Secondary | ICD-10-CM

## 2017-04-17 DIAGNOSIS — O0992 Supervision of high risk pregnancy, unspecified, second trimester: Secondary | ICD-10-CM

## 2017-04-17 NOTE — Progress Notes (Signed)
   PRENATAL VISIT NOTE  Subjective:  Milla Wolff is a 36 y.o. G2P1001 at [redacted]w[redacted]d being seen today for ongoing prenatal care.  She is currently monitored for the following issues for this high-risk pregnancy and has IBS (irritable bowel syndrome); Chronic hypertension in pregnancy; Migraine; Foot pain, bilateral; Nevus of multiple sites of trunk; Worries; Environmental allergies; Insomnia; Female fertility problem; Supervision of high risk pregnancy, antepartum; AMA (advanced maternal age) multigravida 64+, second trimester; History of cesarean delivery; and Sinus bradycardia on her problem list.  Patient reports back pain. OMT helped for a couple of weeks - now pain increasing again..  Contractions: Irritability. Vag. Bleeding: None.  Movement: Present. Denies leaking of fluid.   Mouth ulcers started about 1 week ago - started Valtrex Tuesday evening. Hasn't improved yet. Mostly on sides of tongue.  Batholin glands cyst 1 week ago - spontanouesly ruptured. Improving now.  The following portions of the patient's history were reviewed and updated as appropriate: allergies, current medications, past family history, past medical history, past social history, past surgical history and problem list. Problem list updated.  Objective:   Vitals:   04/17/17 1604  BP: 135/76  Pulse: 68  Weight: 145 lb 8 oz (66 kg)    Fetal Status: Fetal Heart Rate (bpm): 145   Movement: Present     General:  Alert, oriented and cooperative. Patient is in no acute distress.  Skin: Skin is warm and dry. No rash noted.   Cardiovascular: Normal heart rate noted  Respiratory: Normal respiratory effort, no problems with respiration noted  Abdomen: Soft, gravid, appropriate for gestational age.  Pain/Pressure: Present     Pelvic: Cervical exam deferred        Extremities: Normal range of motion.  Edema: None  Mental Status:  Normal mood and affect. Normal behavior. Normal judgment and thought content.   MSK:  Restriction, tenderness, tissue texture changes, and paraspinal spasm in the left lumbar spine  Neuro: Moves all four extremities with no focal neurological deficit   OSE: Head   Cervical   Thoracic   Rib   Lumbar  L5 ESRL  Sacrum  L/L  Pelvis Right Ant innom     Assessment and Plan:  Pregnancy: G2P1001 at [redacted]w[redacted]d  1. Supervision of high risk pregnancy, antepartum FHT and FH normal.  2. AMA (advanced maternal age) multigravida 69+, second trimester No additional testing  3. Chronic hypertension in pregnancy BP controlled off medications  4. Mouth ulcers ? HSV vs aphthous ulcers. Check HSV IgG/IgM. If negative, then can ablate lesions with Silver Nitrate.  5. Back Pain in pregnancy 6. Somatic dysfunction OMT done after patient permission. HVLA technique utilized. Patient tolerated procedure well.   Preterm labor symptoms and general obstetric precautions including but not limited to vaginal bleeding, contractions, leaking of fluid and fetal movement were reviewed in detail with the patient. Please refer to After Visit Summary for other counseling recommendations.  No Follow-up on file.   Truett Mainland, DO

## 2017-04-22 LAB — HSV(HERPES SMPLX)ABS-I+II(IGG+IGM)-BLD
HSV 2 Glycoprotein G Ab, IgG: <0.91 index (ref 0.00–0.90)
HSVI/II COMB AB IGM: 0.92 ratio — AB (ref 0.00–0.90)

## 2017-04-25 ENCOUNTER — Ambulatory Visit (HOSPITAL_COMMUNITY)
Admission: RE | Admit: 2017-04-25 | Discharge: 2017-04-25 | Disposition: A | Discharge status: Home / Self Care | Payer: Medicaid Other | Source: Ambulatory Visit | Attending: Obstetrics and Gynecology | Admitting: Obstetrics and Gynecology

## 2017-04-25 ENCOUNTER — Encounter (HOSPITAL_COMMUNITY): Payer: Self-pay

## 2017-04-25 DIAGNOSIS — O34219 Maternal care for unspecified type scar from previous cesarean delivery: Secondary | ICD-10-CM | POA: Diagnosis not present

## 2017-04-25 DIAGNOSIS — Z3A22 22 weeks gestation of pregnancy: Secondary | ICD-10-CM | POA: Insufficient documentation

## 2017-04-25 DIAGNOSIS — O10012 Pre-existing essential hypertension complicating pregnancy, second trimester: Secondary | ICD-10-CM | POA: Insufficient documentation

## 2017-04-25 DIAGNOSIS — O09522 Supervision of elderly multigravida, second trimester: Secondary | ICD-10-CM | POA: Insufficient documentation

## 2017-04-25 DIAGNOSIS — Z362 Encounter for other antenatal screening follow-up: Secondary | ICD-10-CM | POA: Insufficient documentation

## 2017-04-25 DIAGNOSIS — O09812 Supervision of pregnancy resulting from assisted reproductive technology, second trimester: Secondary | ICD-10-CM | POA: Insufficient documentation

## 2017-04-25 DIAGNOSIS — IMO0002 Reserved for concepts with insufficient information to code with codable children: Secondary | ICD-10-CM

## 2017-04-25 DIAGNOSIS — Z0489 Encounter for examination and observation for other specified reasons: Secondary | ICD-10-CM

## 2017-04-28 ENCOUNTER — Other Ambulatory Visit (HOSPITAL_COMMUNITY): Payer: Self-pay | Admitting: *Deleted

## 2017-04-28 DIAGNOSIS — O10919 Unspecified pre-existing hypertension complicating pregnancy, unspecified trimester: Secondary | ICD-10-CM

## 2017-05-01 ENCOUNTER — Other Ambulatory Visit: Payer: Self-pay | Admitting: *Deleted

## 2017-05-01 DIAGNOSIS — K219 Gastro-esophageal reflux disease without esophagitis: Secondary | ICD-10-CM

## 2017-05-01 MED ORDER — ESOMEPRAZOLE MAGNESIUM 20 MG PO CPDR: 20.0000 mg | DELAYED_RELEASE_CAPSULE | Freq: Every day | ORAL | 1 refills | Status: DC

## 2017-05-08 ENCOUNTER — Ambulatory Visit (INDEPENDENT_AMBULATORY_CARE_PROVIDER_SITE_OTHER): Payer: Medicaid Other | Admitting: Family Medicine

## 2017-05-08 ENCOUNTER — Encounter: Payer: Medicaid Other | Admitting: Family Medicine

## 2017-05-08 VITALS — BP 116/73 | HR 59 | Wt 150.3 lb

## 2017-05-08 DIAGNOSIS — O09522 Supervision of elderly multigravida, second trimester: Secondary | ICD-10-CM

## 2017-05-08 DIAGNOSIS — O10912 Unspecified pre-existing hypertension complicating pregnancy, second trimester: Secondary | ICD-10-CM

## 2017-05-08 DIAGNOSIS — Z98891 History of uterine scar from previous surgery: Secondary | ICD-10-CM

## 2017-05-08 DIAGNOSIS — O10919 Unspecified pre-existing hypertension complicating pregnancy, unspecified trimester: Secondary | ICD-10-CM

## 2017-05-08 DIAGNOSIS — O0992 Supervision of high risk pregnancy, unspecified, second trimester: Secondary | ICD-10-CM

## 2017-05-08 DIAGNOSIS — O0990 Supervision of high risk pregnancy, unspecified, unspecified trimester: Secondary | ICD-10-CM

## 2017-05-08 NOTE — Progress Notes (Signed)
   PRENATAL VISIT NOTE  Subjective:  Rhonda Graves is a 36 y.o. G2P1001 at [redacted]w[redacted]d being seen today for ongoing prenatal care.  She is currently monitored for the following issues for this high-risk pregnancy and has IBS (irritable bowel syndrome); Chronic hypertension in pregnancy; Migraine; Nevus of multiple sites of trunk; Environmental allergies; Female fertility problem; Supervision of high risk pregnancy, antepartum; AMA (advanced maternal age) multigravida 41+, second trimester; History of cesarean delivery; and Sinus bradycardia on her problem list.  Patient reports having a cold, with congestion, sore throat and some ear pressure. No fever. Denies any other concerns.   Contractions: Irritability. Vag. Bleeding: None.  Movement: Present. Denies leaking of fluid.   The following portions of the patient's history were reviewed and updated as appropriate: allergies, current medications, past family history, past medical history, past social history, past surgical history and problem list. Problem list updated.  Objective:   Vitals:   05/08/17 0941  BP: 116/73  Pulse: (!) 59  Weight: 150 lb 4.8 oz (68.2 kg)    Fetal Status: Fetal Heart Rate (bpm): 151   Movement: Present     General:  Alert, oriented and cooperative. Patient is in no acute distress.  HEENT: Bilateral ear canals and TM wnl. Mildly edematous nasal mucosa. Posterior pharynx mildly edematous, w/o exudates  Skin: Skin is warm and dry. No rash noted.   Cardiovascular: Normal heart rate noted  Respiratory: Normal respiratory effort, no problems with respiration noted  Abdomen: Soft, gravid, appropriate for gestational age.  Pain/Pressure: Present     Pelvic: Cervical exam deferred        Extremities: Normal range of motion.  Edema: None  Mental Status:  Normal mood and affect. Normal behavior. Normal judgment and thought content.   Assessment and Plan:  Pregnancy: G2P1001 at [redacted]w[redacted]d  1. Supervision of high risk pregnancy,  antepartum Reviewed last U/S with patient Labs, GTT next visit  2. AMA (advanced maternal age) multigravida 30+, second trimester Low risk Panoroma.   3. History of cesarean delivery Planning on TOLAC. Will need VBAC consent  4. Chronic hypertension in pregnancy Not on any meds. BP wnl today Growth scan scheduled for 8/31  5. URI Improving. Counseled on appropriate use of acetaminophen; nasal saline wash to help with congestion, and honey to help with cough.  Preterm labor symptoms and general obstetric precautions including but not limited to vaginal bleeding, contractions, leaking of fluid and fetal movement were reviewed in detail with the patient. Please refer to After Visit Summary for other counseling recommendations.   Future Appointments Date Time Provider [place]  05/23/2017 3:00 PM [place] 3 [facility]-M[facility] MFC-US  05/29/2017 7:40 AM Aletha Halim, MD [facility]    Almyra Free P. Degele, MD OB Fellow

## 2017-05-09 ENCOUNTER — Encounter: Payer: Medicaid Other | Admitting: Obstetrics and Gynecology

## 2017-05-23 ENCOUNTER — Encounter (HOSPITAL_COMMUNITY): Payer: Self-pay

## 2017-05-23 ENCOUNTER — Ambulatory Visit (HOSPITAL_COMMUNITY)
Admission: RE | Admit: 2017-05-23 | Discharge: 2017-05-23 | Disposition: A | Discharge status: Home / Self Care | Payer: Medicaid Other | Source: Ambulatory Visit | Attending: Obstetrics and Gynecology | Admitting: Obstetrics and Gynecology

## 2017-05-23 DIAGNOSIS — O09522 Supervision of elderly multigravida, second trimester: Secondary | ICD-10-CM | POA: Diagnosis not present

## 2017-05-23 DIAGNOSIS — O34219 Maternal care for unspecified type scar from previous cesarean delivery: Secondary | ICD-10-CM | POA: Diagnosis not present

## 2017-05-23 DIAGNOSIS — O402XX0 Polyhydramnios, second trimester, not applicable or unspecified: Secondary | ICD-10-CM | POA: Diagnosis not present

## 2017-05-23 DIAGNOSIS — Z3A26 26 weeks gestation of pregnancy: Secondary | ICD-10-CM | POA: Insufficient documentation

## 2017-05-23 DIAGNOSIS — O10012 Pre-existing essential hypertension complicating pregnancy, second trimester: Secondary | ICD-10-CM | POA: Insufficient documentation

## 2017-05-23 DIAGNOSIS — O10919 Unspecified pre-existing hypertension complicating pregnancy, unspecified trimester: Secondary | ICD-10-CM

## 2017-05-23 DIAGNOSIS — Z362 Encounter for other antenatal screening follow-up: Secondary | ICD-10-CM | POA: Diagnosis present

## 2017-05-23 DIAGNOSIS — O09812 Supervision of pregnancy resulting from assisted reproductive technology, second trimester: Secondary | ICD-10-CM | POA: Insufficient documentation

## 2017-05-27 ENCOUNTER — Other Ambulatory Visit (HOSPITAL_COMMUNITY): Payer: Self-pay | Admitting: *Deleted

## 2017-05-27 DIAGNOSIS — O10919 Unspecified pre-existing hypertension complicating pregnancy, unspecified trimester: Secondary | ICD-10-CM

## 2017-05-29 ENCOUNTER — Ambulatory Visit (INDEPENDENT_AMBULATORY_CARE_PROVIDER_SITE_OTHER): Payer: Medicaid Other | Admitting: Obstetrics and Gynecology

## 2017-05-29 ENCOUNTER — Encounter: Payer: Self-pay | Admitting: Obstetrics and Gynecology

## 2017-05-29 ENCOUNTER — Ambulatory Visit: Payer: Self-pay

## 2017-05-29 VITALS — BP 123/84 | HR 62 | Wt 150.8 lb

## 2017-05-29 DIAGNOSIS — O10919 Unspecified pre-existing hypertension complicating pregnancy, unspecified trimester: Secondary | ICD-10-CM

## 2017-05-29 DIAGNOSIS — O09522 Supervision of elderly multigravida, second trimester: Secondary | ICD-10-CM

## 2017-05-29 DIAGNOSIS — Z98891 History of uterine scar from previous surgery: Secondary | ICD-10-CM

## 2017-05-29 DIAGNOSIS — Z8619 Personal history of other infectious and parasitic diseases: Secondary | ICD-10-CM

## 2017-05-29 DIAGNOSIS — R600 Localized edema: Secondary | ICD-10-CM

## 2017-05-29 DIAGNOSIS — O10913 Unspecified pre-existing hypertension complicating pregnancy, third trimester: Secondary | ICD-10-CM | POA: Diagnosis present

## 2017-05-29 DIAGNOSIS — O10912 Unspecified pre-existing hypertension complicating pregnancy, second trimester: Secondary | ICD-10-CM

## 2017-05-29 DIAGNOSIS — O403XX0 Polyhydramnios, third trimester, not applicable or unspecified: Secondary | ICD-10-CM | POA: Diagnosis not present

## 2017-05-29 DIAGNOSIS — O0992 Supervision of high risk pregnancy, unspecified, second trimester: Secondary | ICD-10-CM

## 2017-05-29 DIAGNOSIS — O0990 Supervision of high risk pregnancy, unspecified, unspecified trimester: Secondary | ICD-10-CM

## 2017-05-29 DIAGNOSIS — Z23 Encounter for immunization: Secondary | ICD-10-CM

## 2017-05-29 DIAGNOSIS — O409XX0 Polyhydramnios, unspecified trimester, not applicable or unspecified: Secondary | ICD-10-CM

## 2017-05-29 HISTORY — DX: Personal history of other infectious and parasitic diseases: Z86.19

## 2017-05-29 NOTE — Progress Notes (Signed)
Prenatal Visit Note Date: 05/29/2017 Clinic: [facility]  Subjective:  Rhonda Graves is a 36 y.o. G2P1001 at [redacted]w[redacted]d being seen today for ongoing prenatal care.  She is currently monitored for the following issues for this high-risk pregnancy and has IBS (irritable bowel syndrome); Polyhydramnios affecting pregnancy; Chronic hypertension in pregnancy; Migraine; Nevus of multiple sites of trunk; Environmental allergies; Female fertility problem; Supervision of high risk pregnancy, antepartum; AMA (advanced maternal age) multigravida 27+, second trimester; History of cesarean delivery; Sinus bradycardia; and History of herpes simplex infection on her problem list.  Patient reports RLE edema x 2-3wks with occasional numbness.   Contractions: Irregular. Vag. Bleeding: None.  Movement: Present. Denies leaking of fluid.   The following portions of the patient's history were reviewed and updated as appropriate: allergies, current medications, past family history, past medical history, past social history, past surgical history and problem list. Problem list updated.  Objective:   Vitals:   05/29/17 0751  BP: 123/84  Pulse: 62  Weight: 158 lb (71.7 kg)    Fetal Status: Fetal Heart Rate (bpm): 143   Movement: Present     General:  Alert, oriented and cooperative. Patient is in no acute distress.  Skin: Skin is warm and dry. No rash noted.   Cardiovascular: Normal heart rate noted  Respiratory: Normal respiratory effort, no problems with respiration noted  Abdomen: Soft, gravid, appropriate for gestational age. Pain/Pressure: Present     Pelvic:  Cervical exam deferred        Extremities: Normal range of motion.  Edema: Mild pitting, slight indentation  Mental Status: Normal mood and affect. Normal behavior. Normal judgment and thought content.   Urinalysis:      Assessment and Plan:  Pregnancy: G2P1001 at [redacted]w[redacted]d  1. Supervision of high risk pregnancy, antepartum Routine  care.  - Glucose Tolerance, 2 Hours w/1 Hour - RPR - CBC - HIV antibody - Tdap vaccine greater than or equal to 7yo IM  2. Chronic hypertension in pregnancy Doing well on no meds. Normal efw/ac on 8/31 (see below). Continue with serial growth u/s and start ap testing at [place]  3. AMA (advanced maternal age) multigravida 34+, second trimester No issues  4. History of cesarean delivery Leaning towards tolac. D/w pt more at 32-34wks  5. History of herpes simplex infection Cold sores.   6. Polyhydramnios affecting pregnancy mvp 10 on 8/31 u/s. 2hr GTT today (early a1c not run by [facility]). D/w pt mfm recommendation and reasonable to get repeat MVP today. D/w her that she's had normal anatomy and unless a lot of poly, exp management is the best course of action for right now.   7. Edema of right lower extremity Slightly larger, nttp with more varicose veins than LLE. Pre-dates her going on beach trip by about a week. No chest pain, sob. Will get RLE doppler.  - VAS [place] LOWER EXTREMITY VENOUS (DVT); Future  Preterm labor symptoms and general obstetric precautions including but not limited to vaginal bleeding, contractions, leaking of fluid and fetal movement were reviewed in detail with the patient. Please refer to After Visit Summary for other counseling recommendations.  Return in about 2 weeks (around 06/12/2017) for rob.   Aletha Halim, MD

## 2017-05-29 NOTE — Progress Notes (Signed)
Patient here today as a routine OB [redacted]w[redacted]d.Patient complains of swelling in right leg a lot in the evening, not much in the left leg.

## 2017-05-30 ENCOUNTER — Ambulatory Visit (HOSPITAL_COMMUNITY): Payer: Medicaid Other

## 2017-05-30 ENCOUNTER — Ambulatory Visit (HOSPITAL_COMMUNITY)
Admission: RE | Admit: 2017-05-30 | Discharge: 2017-05-30 | Disposition: A | Discharge status: Home / Self Care | Payer: Medicaid Other | Source: Ambulatory Visit | Attending: Obstetrics and Gynecology | Admitting: Obstetrics and Gynecology

## 2017-05-30 DIAGNOSIS — R600 Localized edema: Secondary | ICD-10-CM

## 2017-05-30 LAB — GLUCOSE TOLERANCE, 2 HOURS W/ 1HR
GLUCOSE, 1 HOUR: 90 mg/dL (ref 65–179)
GLUCOSE, 2 HOUR: 75 mg/dL (ref 65–152)
Glucose, Fasting: 73 mg/dL (ref 65–91)

## 2017-05-30 LAB — CBC
Hematocrit: 32.8 % — ABNORMAL LOW (ref 34.0–46.6)
Hemoglobin: 10.8 g/dL — ABNORMAL LOW (ref 11.1–15.9)
MCH: 32 pg (ref 26.6–33.0)
MCHC: 32.9 g/dL (ref 31.5–35.7)
MCV: 97 fL (ref 79–97)
PLATELETS: 233 10*3/uL (ref 150–379)
RBC: 3.37 x10E6/uL — ABNORMAL LOW (ref 3.77–5.28)
RDW: 13.8 % (ref 12.3–15.4)
WBC: 8.3 10*3/uL (ref 3.4–10.8)

## 2017-05-30 LAB — HIV ANTIBODY (ROUTINE TESTING W REFLEX): HIV SCREEN 4TH GENERATION: NONREACTIVE

## 2017-05-30 LAB — RPR: RPR Ser Ql: NONREACTIVE

## 2017-05-30 NOTE — Progress Notes (Signed)
*  Preliminary Results* Right lower extremity venous duplex completed. Right lower extremity is negative for deep vein thrombosis. There is no evidence of right Baker's cyst.  05/30/2017 3:42 PM  Maudry Mayhew, BS, RVT, RDCS, RDMS

## 2017-06-09 ENCOUNTER — Encounter: Payer: Medicaid Other | Admitting: Medical

## 2017-06-09 ENCOUNTER — Ambulatory Visit (INDEPENDENT_AMBULATORY_CARE_PROVIDER_SITE_OTHER): Payer: Medicaid Other | Admitting: Obstetrics & Gynecology

## 2017-06-09 VITALS — BP 114/76 | HR 72 | Wt 152.6 lb

## 2017-06-09 DIAGNOSIS — O409XX0 Polyhydramnios, unspecified trimester, not applicable or unspecified: Secondary | ICD-10-CM

## 2017-06-09 DIAGNOSIS — O10913 Unspecified pre-existing hypertension complicating pregnancy, third trimester: Secondary | ICD-10-CM

## 2017-06-09 DIAGNOSIS — Z98891 History of uterine scar from previous surgery: Secondary | ICD-10-CM

## 2017-06-09 DIAGNOSIS — O10919 Unspecified pre-existing hypertension complicating pregnancy, unspecified trimester: Secondary | ICD-10-CM

## 2017-06-09 DIAGNOSIS — O34219 Maternal care for unspecified type scar from previous cesarean delivery: Secondary | ICD-10-CM

## 2017-06-09 DIAGNOSIS — O0990 Supervision of high risk pregnancy, unspecified, unspecified trimester: Secondary | ICD-10-CM

## 2017-06-09 DIAGNOSIS — O0993 Supervision of high risk pregnancy, unspecified, third trimester: Secondary | ICD-10-CM

## 2017-06-09 NOTE — Patient Instructions (Addendum)
Return to clinic for any scheduled appointments or obstetric concerns, or go to [facility]U for evaluation  Vaginal Birth After Cesarean Delivery Vaginal birth after cesarean delivery (VBAC) is giving birth vaginally after previously delivering a baby by a cesarean. In the past, if a woman had a cesarean delivery, all births afterward would be done by cesarean delivery. This is no longer true. It can be safe for the mother to try a vaginal delivery after having a cesarean delivery. It is important to discuss VBAC with your health care provider early in the pregnancy so you can understand the risks, benefits, and options. It will give you time to decide what is best in your particular case. The final decision about whether to have a VBAC or repeat cesarean delivery should be between you and your health care provider. Any changes in your health or your baby's health during your pregnancy may make it necessary to change your initial decision about VBAC. Women who plan to have a VBAC should check with their health care provider to be sure that:  The previous cesarean delivery was done with a low transverse uterine cut (incision) (not a vertical classical incision).  The birth canal is big enough for the baby.  There were no other operations on the uterus.  An electronic fetal monitor (EFM) will be on at all times during labor.  An operating room will be available and ready in case an emergency cesarean delivery is needed.  A health care provider and surgical nursing staff will be available at all times during labor to be ready to do an emergency delivery cesarean if necessary.  An anesthesiologist will be present in case an emergency cesarean delivery is needed.  The nursery is prepared and has adequate personnel and necessary equipment available to care for the baby in case of an emergency cesarean delivery. Benefits of VBAC  Shorter stay in the hospital.  Avoidance of risks associated with cesarean  delivery, such as: ? Surgical complications, such as opening of the incision or hernia in the incision. ? Injury to other organs. ? Fever. This can occur if an infection develops after surgery. It can also occur as a reaction to the medicine given to make you numb during the surgery.  Less blood loss and need for blood transfusions.  Lower risk of blood clots and infection.  Shorter recovery.  Decreased risk for having to remove the uterus (hysterectomy).  Decreased risk for the placenta to completely or partially cover the opening of the uterus (placenta previa) with a future pregnancy.  Decrease risk in future labor and delivery. Risks of a VBAC  Tearing (rupture) of the uterus. This is occurs in less than 1% of VBACs. The risk of this happening is higher if: ? Steps are taken to begin the labor process (induce labor) or stimulate or strengthen contractions (augment labor). ? Medicine is used to soften (ripen) the cervix.  Having to remove the uterus (hysterectomy) if it ruptures. VBAC should not be done if:  The previous cesarean delivery was done with a vertical (classical) or T-shaped incision or you do not know what kind of incision was made.  You had a ruptured uterus.  You have had certain types of surgery on your uterus, such as removal of uterine fibroids. Ask your health care provider about other types of surgeries that prevent you from having a VBAC.  You have certain medical or childbirth (obstetrical) problems.  There are problems with the baby.  You have   had two previous cesarean deliveries and no vaginal deliveries. Other facts to know about VBAC:  It is safe to have an epidural anesthetic with VBAC.  It is safe to turn the baby from a breech position (attempt an external cephalic version).  It is safe to try a VBAC with twins.  VBAC may not be successful if your baby weights 8.8 lb (4 kg) or more. However, weight predictions are not always accurate and  should not be used alone to decide if VBAC is right for you.  There is an increased failure rate if the time between the cesarean delivery and VBAC is less than 19 months.  Your health care provider may advise against a VBAC if you have preeclampsia (high blood pressure, protein in the urine, and swelling of face and extremities).  VBAC is often successful if you previously gave birth vaginally.  VBAC is often successful when the labor starts spontaneously before the due date.  Delivering a baby through a VBAC is similar to having a normal spontaneous vaginal delivery. This information is not intended to replace advice given to you by your health care provider. Make sure you discuss any questions you have with your health care provider. Document Released: 03/02/2007 Document Revised: 02/15/2016 Document Reviewed: 04/08/2013 Elsevier Interactive Patient Education  2018 Elsevier Inc.  

## 2017-06-09 NOTE — Progress Notes (Signed)
   PRENATAL VISIT NOTE  Subjective:  Rhonda Graves is a 36 y.o. G2P1001 at [redacted]w[redacted]d being seen today for ongoing prenatal care.  She is currently monitored for the following issues for this high-risk pregnancy and has IBS (irritable bowel syndrome); Polyhydramnios affecting pregnancy; Chronic hypertension in pregnancy; Migraine; Nevus of multiple sites of trunk; Environmental allergies; Female fertility problem; Supervision of high risk pregnancy, antepartum; AMA (advanced maternal age) multigravida 54+, second trimester; History of cesarean delivery; Sinus bradycardia; History of herpes simplex infection; and Edema of right lower extremity on her problem list.  Patient reports no complaints.  Contractions: Irregular. Vag. Bleeding: None.  Movement: Present. Denies leaking of fluid.   The following portions of the patient's history were reviewed and updated as appropriate: allergies, current medications, past family history, past medical history, past social history, past surgical history and problem list. Problem list updated.  Objective:   Vitals:   06/09/17 1923  BP: 114/76  Pulse: 72  Weight: 152 lb 9.6 oz (69.2 kg)    Fetal Status: Fetal Heart Rate (bpm): 143 Fundal Height: 30 cm Movement: Present     General:  Alert, oriented and cooperative. Patient is in no acute distress.  Skin: Skin is warm and dry. No rash noted.   Cardiovascular: Normal heart rate noted  Respiratory: Normal respiratory effort, no problems with respiration noted  Abdomen: Soft, gravid, appropriate for gestational age.  Pain/Pressure: Present     Pelvic: Cervical exam deferred        Extremities: Normal range of motion.  Edema: Trace  Mental Status:  Normal mood and affect. Normal behavior. Normal judgment and thought content.   Assessment and Plan:  Pregnancy: G2P1001 at [redacted]w[redacted]d  1. Chronic hypertension in pregnancy Normal BP today, continue ASA Will start antenatal testing at 32 weeks.  Next growth scanon  06/20/17  2. Polyhydramnios affecting pregnancy Rescan scheduled 06/20/17  3. History of cesarean delivery Counseled about TOLAC vs RCS, risks discussed in detail. Desires TOLAC, consent signed today.   4. Supervision of high risk pregnancy, antepartum Preterm labor symptoms and general obstetric precautions including but not limited to vaginal bleeding, contractions, leaking of fluid and fetal movement were reviewed in detail with the patient. Please refer to After Visit Summary for other counseling recommendations.  Return in about 2 weeks (around 06/23/2017) for OB Visit ([place]).   Verita Schneiders, MD

## 2017-06-12 ENCOUNTER — Encounter: Payer: Medicaid Other | Admitting: Obstetrics & Gynecology

## 2017-06-20 ENCOUNTER — Other Ambulatory Visit (HOSPITAL_COMMUNITY): Payer: Self-pay | Admitting: Obstetrics and Gynecology

## 2017-06-20 ENCOUNTER — Ambulatory Visit (HOSPITAL_COMMUNITY)
Admission: RE | Admit: 2017-06-20 | Discharge: 2017-06-20 | Disposition: A | Discharge status: Home / Self Care | Payer: Medicaid Other | Source: Ambulatory Visit | Attending: Obstetrics and Gynecology | Admitting: Obstetrics and Gynecology

## 2017-06-20 ENCOUNTER — Encounter (HOSPITAL_COMMUNITY): Payer: Self-pay

## 2017-06-20 DIAGNOSIS — O34219 Maternal care for unspecified type scar from previous cesarean delivery: Secondary | ICD-10-CM | POA: Diagnosis not present

## 2017-06-20 DIAGNOSIS — O10013 Pre-existing essential hypertension complicating pregnancy, third trimester: Secondary | ICD-10-CM | POA: Insufficient documentation

## 2017-06-20 DIAGNOSIS — O09813 Supervision of pregnancy resulting from assisted reproductive technology, third trimester: Secondary | ICD-10-CM | POA: Diagnosis not present

## 2017-06-20 DIAGNOSIS — O09523 Supervision of elderly multigravida, third trimester: Secondary | ICD-10-CM | POA: Diagnosis not present

## 2017-06-20 DIAGNOSIS — Z3A30 30 weeks gestation of pregnancy: Secondary | ICD-10-CM | POA: Diagnosis present

## 2017-06-20 DIAGNOSIS — O10919 Unspecified pre-existing hypertension complicating pregnancy, unspecified trimester: Secondary | ICD-10-CM

## 2017-06-26 ENCOUNTER — Encounter: Payer: Medicaid Other | Admitting: Obstetrics & Gynecology

## 2017-06-26 ENCOUNTER — Other Ambulatory Visit: Payer: Self-pay | Admitting: *Deleted

## 2017-06-26 DIAGNOSIS — K121 Other forms of stomatitis: Secondary | ICD-10-CM

## 2017-06-26 MED ORDER — MAGIC MOUTHWASH: 5.0000 mL | Freq: Four times a day (QID) | ORAL | 1 refills | Status: DC | PRN

## 2017-06-26 NOTE — Progress Notes (Signed)
Patient to clinic for mouth ulcers, making it painful to eat and drink, has not been able to eat solids x2 days. Stated that her dentist prescribed antiviral medication but it doesn't seem to be helping. Consulted with Dr Hulan Fray who examined the patient. Order received for magic mouthwash. After consulting the hospital pharmacist,prescription was called to patient's pharmacy.

## 2017-06-27 ENCOUNTER — Telehealth: Payer: Self-pay | Admitting: *Deleted

## 2017-06-27 MED ORDER — VALACYCLOVIR HCL 1 G PO TABS: 1000.0000 mg | ORAL_TABLET | Freq: Two times a day (BID) | ORAL | 1 refills | Status: DC

## 2017-06-27 NOTE — Telephone Encounter (Signed)
Received call left on nurse voicemail today at [phone].  Patient requests a refill on Valtrex and requests a return call.  Refill ordered.  Phone call to patient to make her aware.  Pharmacy confirmed.  Told patient she could pick up at pharmacy later today.

## 2017-07-02 ENCOUNTER — Ambulatory Visit: Payer: Self-pay

## 2017-07-02 ENCOUNTER — Ambulatory Visit (INDEPENDENT_AMBULATORY_CARE_PROVIDER_SITE_OTHER): Payer: Medicaid Other | Admitting: *Deleted

## 2017-07-02 ENCOUNTER — Ambulatory Visit (INDEPENDENT_AMBULATORY_CARE_PROVIDER_SITE_OTHER): Payer: Medicaid Other | Admitting: Obstetrics & Gynecology

## 2017-07-02 VITALS — BP 123/79 | HR 65 | Wt 153.0 lb

## 2017-07-02 DIAGNOSIS — O10913 Unspecified pre-existing hypertension complicating pregnancy, third trimester: Secondary | ICD-10-CM

## 2017-07-02 DIAGNOSIS — O10919 Unspecified pre-existing hypertension complicating pregnancy, unspecified trimester: Secondary | ICD-10-CM

## 2017-07-02 DIAGNOSIS — O0992 Supervision of high risk pregnancy, unspecified, second trimester: Secondary | ICD-10-CM

## 2017-07-02 DIAGNOSIS — O09522 Supervision of elderly multigravida, second trimester: Secondary | ICD-10-CM

## 2017-07-02 DIAGNOSIS — G4701 Insomnia due to medical condition: Secondary | ICD-10-CM

## 2017-07-02 DIAGNOSIS — K219 Gastro-esophageal reflux disease without esophagitis: Secondary | ICD-10-CM

## 2017-07-02 DIAGNOSIS — O0990 Supervision of high risk pregnancy, unspecified, unspecified trimester: Secondary | ICD-10-CM

## 2017-07-02 LAB — POCT URINALYSIS DIP (DEVICE)
BILIRUBIN URINE: NEGATIVE
GLUCOSE, UA: NEGATIVE mg/dL
Hgb urine dipstick: NEGATIVE
KETONES UR: NEGATIVE mg/dL
LEUKOCYTES UA: NEGATIVE
Nitrite: NEGATIVE
PROTEIN: NEGATIVE mg/dL
Specific Gravity, Urine: 1.015 (ref 1.005–1.030)
Urobilinogen, UA: 0.2 mg/dL (ref 0.0–1.0)
pH: 7 (ref 5.0–8.0)

## 2017-07-02 MED ORDER — ZOLPIDEM TARTRATE 5 MG PO TABS: 5.0000 mg | ORAL_TABLET | Freq: Every evening | ORAL | 5 refills | Status: DC | PRN

## 2017-07-02 MED ORDER — ESOMEPRAZOLE MAGNESIUM 20 MG PO CPDR: 20.0000 mg | DELAYED_RELEASE_CAPSULE | Freq: Every day | ORAL | 1 refills | Status: DC

## 2017-07-02 NOTE — Progress Notes (Signed)

## 2017-07-02 NOTE — Patient Instructions (Signed)
Return to clinic for any scheduled appointments or obstetric concerns, or go to [facility]U for evaluation  

## 2017-07-02 NOTE — Progress Notes (Signed)
   PRENATAL VISIT NOTE  Subjective:  Rhonda Graves is a 36 y.o. G2P1001 at [redacted]w[redacted]d being seen today for ongoing prenatal care.  She is currently monitored for the following issues for this high-risk pregnancy and has IBS (irritable bowel syndrome); Chronic hypertension in pregnancy; Migraine; Nevus of multiple sites of trunk; Environmental allergies; Female fertility problem; Supervision of high risk pregnancy, antepartum; AMA (advanced maternal age) multigravida 56+, second trimester; History of cesarean delivery; Sinus bradycardia; History of herpes simplex infection; and Edema of right lower extremity on her problem list.  Patient reports insomnia, desires Ambien.  Contractions: Irregular. Vag. Bleeding: None.  Movement: Present. Denies leaking of fluid.   The following portions of the patient's history were reviewed and updated as appropriate: allergies, current medications, past family history, past medical history, past social history, past surgical history and problem list. Problem list updated.  Objective:   Vitals:   07/02/17 1252  BP: 123/79  Pulse: 65  Weight: 153 lb (69.4 kg)    Fetal Status: Fetal Heart Rate (bpm): NST   Movement: Present     General:  Alert, oriented and cooperative. Patient is in no acute distress.  Skin: Skin is warm and dry. No rash noted.   Cardiovascular: Normal heart rate noted  Respiratory: Normal respiratory effort, no problems with respiration noted  Abdomen: Soft, gravid, appropriate for gestational age.  Pain/Pressure: Present     Pelvic: Cervical exam deferred        Extremities: Normal range of motion.  Edema: None  Mental Status:  Normal mood and affect. Normal behavior. Normal judgment and thought content.   Assessment and Plan:  Pregnancy: G2P1001 at [redacted]w[redacted]d  1. Chronic hypertension in pregnancy Stable BP, no meds. BPP is 10/10, normal AFI of 18.6 cm.  Continue recommended antenatal testing and prenatal care.  - [place] MFM FETAL BPP WO NON  STRESS; Future  2. Gastroesophageal reflux disease, esophagitis presence not specified Refill ordered - esomeprazole (NEXIUM) 20 MG capsule; Take 1 capsule (20 mg total) by mouth daily at 12 noon.  Dispense: 30 capsule; Refill: 1  3. Insomnia due to medical condition - zolpidem (AMBIEN) 5 MG tablet; Take 1 tablet (5 mg total) by mouth at bedtime as needed for sleep.  Dispense: 30 tablet; Refill: 5  4. AMA (advanced maternal age) multigravida 77+, second trimester 5. Supervision of high risk pregnancy, antepartum Preterm labor symptoms and general obstetric precautions including but not limited to vaginal bleeding, contractions, leaking of fluid and fetal movement were reviewed in detail with the patient. Please refer to After Visit Summary for other counseling recommendations.  Return in about 7 days (around 07/09/2017) for as scheduled.   Verita Schneiders, MD

## 2017-07-09 ENCOUNTER — Ambulatory Visit (INDEPENDENT_AMBULATORY_CARE_PROVIDER_SITE_OTHER): Payer: Medicaid Other | Admitting: *Deleted

## 2017-07-09 ENCOUNTER — Ambulatory Visit: Payer: Self-pay

## 2017-07-09 ENCOUNTER — Ambulatory Visit (INDEPENDENT_AMBULATORY_CARE_PROVIDER_SITE_OTHER): Payer: Medicaid Other | Admitting: Obstetrics and Gynecology

## 2017-07-09 VITALS — BP 116/76 | HR 66 | Wt 154.5 lb

## 2017-07-09 DIAGNOSIS — O10913 Unspecified pre-existing hypertension complicating pregnancy, third trimester: Secondary | ICD-10-CM

## 2017-07-09 DIAGNOSIS — O10919 Unspecified pre-existing hypertension complicating pregnancy, unspecified trimester: Secondary | ICD-10-CM

## 2017-07-09 DIAGNOSIS — O0993 Supervision of high risk pregnancy, unspecified, third trimester: Secondary | ICD-10-CM

## 2017-07-09 DIAGNOSIS — Z98891 History of uterine scar from previous surgery: Secondary | ICD-10-CM

## 2017-07-09 DIAGNOSIS — O09522 Supervision of elderly multigravida, second trimester: Secondary | ICD-10-CM

## 2017-07-09 DIAGNOSIS — O0990 Supervision of high risk pregnancy, unspecified, unspecified trimester: Secondary | ICD-10-CM

## 2017-07-09 DIAGNOSIS — O09523 Supervision of elderly multigravida, third trimester: Secondary | ICD-10-CM

## 2017-07-09 NOTE — Progress Notes (Signed)
Pt informed that the ultrasound is considered a limited OB ultrasound and is not intended to be a complete ultrasound exam.  Patient also informed that the ultrasound is not being completed with the intent of assessing for fetal or placental anomalies or any pelvic abnormalities.  Explained that the purpose of today's ultrasound is to assess for presentation, BPP and .  Patient acknowledges the purpose of the exam and the limitations of the study.

## 2017-07-09 NOTE — Progress Notes (Signed)
Pt reports increase of negative feelings and feeling depressed.  She is concerned because og her history of depression. She does not want to be around friends or family and wants to be more by herself. PHQ-9 scale is 5 today.  Pt was offered appt to see [facility]. She declined for today but will schedule appt with her for next week.  [place] for growth and BPP next week.

## 2017-07-09 NOTE — Progress Notes (Signed)
Subjective:  Rhonda Graves is a 36 y.o. G2P1001 at [redacted]w[redacted]d being seen today for ongoing prenatal care.  She is currently monitored for the following issues for this high-risk pregnancy and has IBS (irritable bowel syndrome); Chronic hypertension in pregnancy; Migraine; Nevus of multiple sites of trunk; Environmental allergies; Female fertility problem; Supervision of high risk pregnancy, antepartum; AMA (advanced maternal age) multigravida 46+, second trimester; History of cesarean delivery; Sinus bradycardia; History of herpes simplex infection; and Edema of right lower extremity on her problem list.  Patient reports mouth sores.  Contractions: Irregular. Vag. Bleeding: None.  Movement: Present. Denies leaking of fluid.   The following portions of the patient's history were reviewed and updated as appropriate: allergies, current medications, past family history, past medical history, past social history, past surgical history and problem list. Problem list updated.  Objective:   Vitals:   07/09/17 1319  BP: 116/76  Pulse: 66  Weight: 70.1 kg (154 lb 8 oz)    Fetal Status: Fetal Heart Rate (bpm): NST   Movement: Present     General:  Alert, oriented and cooperative. Patient is in no acute distress.  Skin: Skin is warm and dry. No rash noted.   Cardiovascular: Normal heart rate noted  Respiratory: Normal respiratory effort, no problems with respiration noted  Abdomen: Soft, gravid, appropriate for gestational age. Pain/Pressure: Present     Pelvic:  Cervical exam deferred        Extremities: Normal range of motion.  Edema: None  Mental Status: Normal mood and affect. Normal behavior. Normal judgment and thought content.   Urinalysis:      Assessment and Plan:  Pregnancy: G2P1001 at [redacted]w[redacted]d  1. Supervision of high risk pregnancy, antepartum Tx for mouth sores reviewed - CBC With Differential  2. Chronic hypertension in pregnancy BP stable without meds Continue with BASA BPP/10/10  today Growth scan next week'  3. History of cesarean delivery Desires TOLAC and consents has been signed  4. AMA (advanced maternal age) multigravida 36+, second trimester Nl NIPS  Preterm labor symptoms and general obstetric precautions including but not limited to vaginal bleeding, contractions, leaking of fluid and fetal movement were reviewed in detail with the patient. Please refer to After Visit Summary for other counseling recommendations.  Return in about 1 week (around 07/16/2017) for [facility];  11/14  HOB.   Chancy Milroy, MD

## 2017-07-10 ENCOUNTER — Encounter: Payer: Medicaid Other | Admitting: Obstetrics & Gynecology

## 2017-07-10 LAB — CBC WITH DIFFERENTIAL
BASOS: 0 %
Basophils Absolute: 0 10*3/uL (ref 0.0–0.2)
EOS (ABSOLUTE): 0.2 10*3/uL (ref 0.0–0.4)
EOS: 2 %
HEMATOCRIT: 32.6 % — AB (ref 34.0–46.6)
Hemoglobin: 10.9 g/dL — ABNORMAL LOW (ref 11.1–15.9)
Immature Grans (Abs): 0.2 10*3/uL — ABNORMAL HIGH (ref 0.0–0.1)
Immature Granulocytes: 2 %
Lymphocytes Absolute: 2.1 10*3/uL (ref 0.7–3.1)
Lymphs: 21 %
MCH: 32.1 pg (ref 26.6–33.0)
MCHC: 33.4 g/dL (ref 31.5–35.7)
MCV: 96 fL (ref 79–97)
MONOS ABS: 0.6 10*3/uL (ref 0.1–0.9)
Monocytes: 6 %
NEUTROS ABS: 6.9 10*3/uL (ref 1.4–7.0)
Neutrophils: 69 %
RBC: 3.4 x10E6/uL — AB (ref 3.77–5.28)
RDW: 13.7 % (ref 12.3–15.4)
WBC: 10 10*3/uL (ref 3.4–10.8)

## 2017-07-16 ENCOUNTER — Ambulatory Visit (INDEPENDENT_AMBULATORY_CARE_PROVIDER_SITE_OTHER): Payer: Medicaid Other | Admitting: *Deleted

## 2017-07-16 ENCOUNTER — Ambulatory Visit (HOSPITAL_COMMUNITY)
Admission: RE | Admit: 2017-07-16 | Discharge: 2017-07-16 | Disposition: A | Discharge status: Home / Self Care | Payer: Medicaid Other | Source: Ambulatory Visit | Attending: Obstetrics and Gynecology | Admitting: Obstetrics and Gynecology

## 2017-07-16 ENCOUNTER — Other Ambulatory Visit (HOSPITAL_COMMUNITY)
Admission: RE | Admit: 2017-07-16 | Discharge: 2017-07-16 | Disposition: A | Discharge status: Home / Self Care | Payer: Medicaid Other | Source: Ambulatory Visit | Attending: Obstetrics and Gynecology | Admitting: Obstetrics and Gynecology

## 2017-07-16 ENCOUNTER — Encounter (HOSPITAL_COMMUNITY): Payer: Self-pay

## 2017-07-16 ENCOUNTER — Ambulatory Visit (INDEPENDENT_AMBULATORY_CARE_PROVIDER_SITE_OTHER): Payer: Medicaid Other | Admitting: Obstetrics and Gynecology

## 2017-07-16 VITALS — BP 121/83 | HR 62 | Wt 155.1 lb

## 2017-07-16 DIAGNOSIS — O09523 Supervision of elderly multigravida, third trimester: Secondary | ICD-10-CM | POA: Insufficient documentation

## 2017-07-16 DIAGNOSIS — O10913 Unspecified pre-existing hypertension complicating pregnancy, third trimester: Secondary | ICD-10-CM

## 2017-07-16 DIAGNOSIS — Z113 Encounter for screening for infections with a predominantly sexual mode of transmission: Secondary | ICD-10-CM

## 2017-07-16 DIAGNOSIS — O0990 Supervision of high risk pregnancy, unspecified, unspecified trimester: Secondary | ICD-10-CM

## 2017-07-16 DIAGNOSIS — Z3A34 34 weeks gestation of pregnancy: Secondary | ICD-10-CM | POA: Insufficient documentation

## 2017-07-16 DIAGNOSIS — Z98891 History of uterine scar from previous surgery: Secondary | ICD-10-CM

## 2017-07-16 DIAGNOSIS — O34219 Maternal care for unspecified type scar from previous cesarean delivery: Secondary | ICD-10-CM | POA: Insufficient documentation

## 2017-07-16 DIAGNOSIS — O10919 Unspecified pre-existing hypertension complicating pregnancy, unspecified trimester: Secondary | ICD-10-CM

## 2017-07-16 DIAGNOSIS — R109 Unspecified abdominal pain: Secondary | ICD-10-CM | POA: Diagnosis not present

## 2017-07-16 DIAGNOSIS — O26893 Other specified pregnancy related conditions, third trimester: Secondary | ICD-10-CM | POA: Diagnosis not present

## 2017-07-16 DIAGNOSIS — Z8619 Personal history of other infectious and parasitic diseases: Secondary | ICD-10-CM

## 2017-07-16 DIAGNOSIS — O0993 Supervision of high risk pregnancy, unspecified, third trimester: Secondary | ICD-10-CM | POA: Insufficient documentation

## 2017-07-16 DIAGNOSIS — O09522 Supervision of elderly multigravida, second trimester: Secondary | ICD-10-CM

## 2017-07-16 LAB — POCT URINALYSIS DIP (DEVICE)
BILIRUBIN URINE: NEGATIVE
Glucose, UA: NEGATIVE mg/dL
HGB URINE DIPSTICK: NEGATIVE
KETONES UR: NEGATIVE mg/dL
LEUKOCYTES UA: NEGATIVE
Nitrite: NEGATIVE
Protein, ur: NEGATIVE mg/dL
SPECIFIC GRAVITY, URINE: 1.015 (ref 1.005–1.030)
Urobilinogen, UA: 0.2 mg/dL (ref 0.0–1.0)
pH: 7.5 (ref 5.0–8.0)

## 2017-07-16 NOTE — Progress Notes (Signed)
Pt has observed a rash on her abdomen - she denies having itching or discomfort.

## 2017-07-16 NOTE — Patient Instructions (Signed)
Third Trimester of Pregnancy The third trimester is from week 28 through week 40 (months 7 through 9). The third trimester is a time when the unborn baby (fetus) is growing rapidly. At the end of the ninth month, the fetus is about 20 inches in length and weighs 6-10 pounds. Body changes during your third trimester Your body will continue to go through many changes during pregnancy. The changes vary from woman to woman. During the third trimester:  Your weight will continue to increase. You can expect to gain 25-35 pounds (11-16 kg) by the end of the pregnancy.  You may begin to get stretch marks on your hips, abdomen, and breasts.  You may urinate more often because the fetus is moving lower into your pelvis and pressing on your bladder.  You may develop or continue to have heartburn. This is caused by increased hormones that slow down muscles in the digestive tract.  You may develop or continue to have constipation because increased hormones slow digestion and cause the muscles that push waste through your intestines to relax.  You may develop hemorrhoids. These are swollen veins (varicose veins) in the rectum that can itch or be painful.  You may develop swollen, bulging veins (varicose veins) in your legs.  You may have increased body aches in the pelvis, back, or thighs. This is due to weight gain and increased hormones that are relaxing your joints.  You may have changes in your hair. These can include thickening of your hair, rapid growth, and changes in texture. Some women also have hair loss during or after pregnancy, or hair that feels dry or thin. Your hair will most likely return to normal after your baby is born.  Your breasts will continue to grow and they will continue to become tender. A yellow fluid (colostrum) may leak from your breasts. This is the first milk you are producing for your baby.  Your belly button may stick out.  You may notice more swelling in your hands,  face, or ankles.  You may have increased tingling or numbness in your hands, arms, and legs. The skin on your belly may also feel numb.  You may feel short of breath because of your expanding uterus.  You may have more problems sleeping. This can be caused by the size of your belly, increased need to urinate, and an increase in your body's metabolism.  You may notice the fetus "dropping," or moving lower in your abdomen (lightening).  You may have increased vaginal discharge.  You may notice your joints feel loose and you may have pain around your pelvic bone.  What to expect at prenatal visits You will have prenatal exams every 2 weeks until week 36. Then you will have weekly prenatal exams. During a routine prenatal visit:  You will be weighed to make sure you and the baby are growing normally.  Your blood pressure will be taken.  Your abdomen will be measured to track your baby's growth.  The fetal heartbeat will be listened to.  Any test results from the previous visit will be discussed.  You may have a cervical check near your due date to see if your cervix has softened or thinned (effaced).  You will be tested for Group B streptococcus. This happens between 35 and 37 weeks.  Your health care provider may ask you:  What your birth plan is.  How you are feeling.  If you are feeling the baby move.  If you have had   any abnormal symptoms, such as leaking fluid, bleeding, severe headaches, or abdominal cramping.  If you are using any tobacco products, including cigarettes, chewing tobacco, and electronic cigarettes.  If you have any questions.  Other tests or screenings that may be performed during your third trimester include:  Blood tests that check for low iron levels (anemia).  Fetal testing to check the health, activity level, and growth of the fetus. Testing is done if you have certain medical conditions or if there are problems during the  pregnancy.  Nonstress test (NST). This test checks the health of your baby to make sure there are no signs of problems, such as the baby not getting enough oxygen. During this test, a belt is placed around your belly. The baby is made to move, and its heart rate is monitored during movement.  What is false labor? False labor is a condition in which you feel small, irregular tightenings of the muscles in the womb (contractions) that usually go away with rest, changing position, or drinking water. These are called Braxton Hicks contractions. Contractions may last for hours, days, or even weeks before true labor sets in. If contractions come at regular intervals, become more frequent, increase in intensity, or become painful, you should see your health care provider. What are the signs of labor?  Abdominal cramps.  Regular contractions that start at 10 minutes apart and become stronger and more frequent with time.  Contractions that start on the top of the uterus and spread down to the lower abdomen and back.  Increased pelvic pressure and dull back pain.  A watery or bloody mucus discharge that comes from the vagina.  Leaking of amniotic fluid. This is also known as your "water breaking." It could be a slow trickle or a gush. Let your health care provider know if it has a color or strange odor. If you have any of these signs, call your health care provider right away, even if it is before your due date. Follow these instructions at home: Medicines  Follow your health care provider's instructions regarding medicine use. Specific medicines may be either safe or unsafe to take during pregnancy.  Take a prenatal vitamin that contains at least 600 micrograms (mcg) of folic acid.  If you develop constipation, try taking a stool softener if your health care provider approves. Eating and drinking  Eat a balanced diet that includes fresh fruits and vegetables, whole grains, good sources of protein  such as meat, eggs, or tofu, and low-fat dairy. Your health care provider will help you determine the amount of weight gain that is right for you.  Avoid raw meat and uncooked cheese. These carry germs that can cause birth defects in the baby.  If you have low calcium intake from food, talk to your health care provider about whether you should take a daily calcium supplement.  Eat four or five small meals rather than three large meals a day.  Limit foods that are high in fat and processed sugars, such as fried and sweet foods.  To prevent constipation: ? Drink enough fluid to keep your urine clear or pale yellow. ? Eat foods that are high in fiber, such as fresh fruits and vegetables, whole grains, and beans. Activity  Exercise only as directed by your health care provider. Most women can continue their usual exercise routine during pregnancy. Try to exercise for 30 minutes at least 5 days a week. Stop exercising if you experience uterine contractions.  Avoid heavy   lifting.  Do not exercise in extreme heat or humidity, or at high altitudes.  Wear low-heel, comfortable shoes.  Practice good posture.  You may continue to have sex unless your health care provider tells you otherwise. Relieving pain and discomfort  Take frequent breaks and rest with your legs elevated if you have leg cramps or low back pain.  Take warm sitz baths to soothe any pain or discomfort caused by hemorrhoids. Use hemorrhoid cream if your health care provider approves.  Wear a good support bra to prevent discomfort from breast tenderness.  If you develop varicose veins: ? Wear support pantyhose or compression stockings as told by your healthcare provider. ? Elevate your feet for 15 minutes, 3-4 times a day. Prenatal care  Write down your questions. Take them to your prenatal visits.  Keep all your prenatal visits as told by your health care provider. This is important. Safety  Wear your seat belt at  all times when driving.  Make a list of emergency phone numbers, including numbers for family, friends, the hospital, and police and fire departments. General instructions  Avoid cat litter boxes and soil used by cats. These carry germs that can cause birth defects in the baby. If you have a cat, ask someone to clean the litter box for you.  Do not travel far distances unless it is absolutely necessary and only with the approval of your health care provider.  Do not use hot tubs, steam rooms, or saunas.  Do not drink alcohol.  Do not use any products that contain nicotine or tobacco, such as cigarettes and e-cigarettes. If you need help quitting, ask your health care provider.  Do not use any medicinal herbs or unprescribed drugs. These chemicals affect the formation and growth of the baby.  Do not douche or use tampons or scented sanitary pads.  Do not cross your legs for long periods of time.  To prepare for the arrival of your baby: ? Take prenatal classes to understand, practice, and ask questions about labor and delivery. ? Make a trial run to the hospital. ? Visit the hospital and tour the maternity area. ? Arrange for maternity or paternity leave through employers. ? Arrange for family and friends to take care of pets while you are in the hospital. ? Purchase a rear-facing car seat and make sure you know how to install it in your car. ? Pack your hospital bag. ? Prepare the baby's nursery. Make sure to remove all pillows and stuffed animals from the baby's crib to prevent suffocation.  Visit your dentist if you have not gone during your pregnancy. Use a soft toothbrush to brush your teeth and be gentle when you floss. Contact a health care provider if:  You are unsure if you are in labor or if your water has broken.  You become dizzy.  You have mild pelvic cramps, pelvic pressure, or nagging pain in your abdominal area.  You have lower back pain.  You have persistent  nausea, vomiting, or diarrhea.  You have an unusual or bad smelling vaginal discharge.  You have pain when you urinate. Get help right away if:  Your water breaks before 37 weeks.  You have regular contractions less than 5 minutes apart before 37 weeks.  You have a fever.  You are leaking fluid from your vagina.  You have spotting or bleeding from your vagina.  You have severe abdominal pain or cramping.  You have rapid weight loss or weight gain.    You have shortness of breath with chest pain.  You notice sudden or extreme swelling of your face, hands, ankles, feet, or legs.  Your baby makes fewer than 10 movements in 2 hours.  You have severe headaches that do not go away when you take medicine.  You have vision changes. Summary  The third trimester is from week 28 through week 40, months 7 through 9. The third trimester is a time when the unborn baby (fetus) is growing rapidly.  During the third trimester, your discomfort may increase as you and your baby continue to gain weight. You may have abdominal, leg, and back pain, sleeping problems, and an increased need to urinate.  During the third trimester your breasts will keep growing and they will continue to become tender. A yellow fluid (colostrum) may leak from your breasts. This is the first milk you are producing for your baby.  False labor is a condition in which you feel small, irregular tightenings of the muscles in the womb (contractions) that eventually go away. These are called Braxton Hicks contractions. Contractions may last for hours, days, or even weeks before true labor sets in.  Signs of labor can include: abdominal cramps; regular contractions that start at 10 minutes apart and become stronger and more frequent with time; watery or bloody mucus discharge that comes from the vagina; increased pelvic pressure and dull back pain; and leaking of amniotic fluid. This information is not intended to replace advice  given to you by your health care provider. Make sure you discuss any questions you have with your health care provider. Document Released: 09/03/2001 Document Revised: 02/15/2016 Document Reviewed: 11/10/2012 Elsevier Interactive Patient Education  2017 [employer]  

## 2017-07-16 NOTE — Progress Notes (Signed)
   PRENATAL VISIT NOTE  Subjective:  Rhonda Graves is a 36 y.o. G2P1001 at [redacted]w[redacted]d being seen today for ongoing prenatal care.  She is currently monitored for the following issues for this high-risk pregnancy and has IBS (irritable bowel syndrome); Chronic hypertension in pregnancy; Migraine; Nevus of multiple sites of trunk; Environmental allergies; Female fertility problem; Supervision of high risk pregnancy, antepartum; AMA (advanced maternal age) multigravida 29+, second trimester; History of cesarean delivery; Sinus bradycardia; History of herpes simplex infection; and Edema of right lower extremity on her problem list.  Patient reports cramping today that is more regular and like a period than it has been.  Contractions: Irregular. Vag. Bleeding: None.  Movement: Present. Denies leaking of fluid. Also with rash on abdomen that is not painful or itchy  The following portions of the patient's history were reviewed and updated as appropriate: allergies, current medications, past family history, past medical history, past social history, past surgical history and problem list. Problem list updated.  Objective:   Vitals:   07/16/17 1258  BP: 121/83  Pulse: 62  Weight: 155 lb 1.6 oz (70.4 kg)    Fetal Status: Fetal Heart Rate (bpm): NST   Movement: Present     General:  Alert, oriented and cooperative. Patient is in no acute distress.  Skin: Skin is warm and dry. No rash noted.   Cardiovascular: Normal heart rate noted  Respiratory: Normal respiratory effort, no problems with respiration noted  Abdomen: Soft, gravid, appropriate for gestational age. Pin point rash across top of abdomen.  Pain/Pressure: Present     Pelvic: Cervical exam deferred        Extremities: Normal range of motion.  Edema: None  Mental Status:  Normal mood and affect. Normal behavior. Normal judgment and thought content.   Assessment and Plan:  Pregnancy: G2P1001 at [redacted]w[redacted]d  1. Chronic hypertension in  pregnancy 10/24: report not yet available, EFW 2405 gms (measuring [redacted]w[redacted]d) NST today reactive  2. Supervision of high risk pregnancy, antepartum  3. AMA (advanced maternal age) multigravida 2+, second trimester  4. History of cesarean delivery For TOLAC  5. History of herpes simplex infection Cold sores Have not recurred, has meds at home prn  6. Cramping U dip negative Wet prep sent Encouraged PO hydration and rest  Preterm labor symptoms and general obstetric precautions including but not limited to vaginal bleeding, contractions, leaking of fluid and fetal movement were reviewed in detail with the patient. Please refer to After Visit Summary for other counseling recommendations.  Return in about 7 days (around 07/23/2017) for as scheduled, OB visit.   Sloan Leiter, MD

## 2017-07-16 NOTE — Progress Notes (Signed)
Pt reports having intermittent abdominal pain today - "feels like period pain". [place] for growth and BPP done today

## 2017-07-17 LAB — CERVICOVAGINAL ANCILLARY ONLY
BACTERIAL VAGINITIS: NEGATIVE
CHLAMYDIA, DNA PROBE: NEGATIVE
Candida vaginitis: NEGATIVE
Neisseria Gonorrhea: NEGATIVE
Trichomonas: NEGATIVE

## 2017-07-18 ENCOUNTER — Ambulatory Visit (HOSPITAL_COMMUNITY): Payer: Self-pay

## 2017-07-23 ENCOUNTER — Ambulatory Visit (INDEPENDENT_AMBULATORY_CARE_PROVIDER_SITE_OTHER): Payer: Medicaid Other | Admitting: Family Medicine

## 2017-07-23 ENCOUNTER — Ambulatory Visit (INDEPENDENT_AMBULATORY_CARE_PROVIDER_SITE_OTHER): Payer: Medicaid Other | Admitting: *Deleted

## 2017-07-23 ENCOUNTER — Ambulatory Visit: Payer: Self-pay

## 2017-07-23 VITALS — BP 133/78 | HR 75 | Wt 158.0 lb

## 2017-07-23 DIAGNOSIS — M549 Dorsalgia, unspecified: Secondary | ICD-10-CM | POA: Diagnosis not present

## 2017-07-23 DIAGNOSIS — O09523 Supervision of elderly multigravida, third trimester: Secondary | ICD-10-CM | POA: Diagnosis not present

## 2017-07-23 DIAGNOSIS — O10913 Unspecified pre-existing hypertension complicating pregnancy, third trimester: Secondary | ICD-10-CM

## 2017-07-23 DIAGNOSIS — M9903 Segmental and somatic dysfunction of lumbar region: Secondary | ICD-10-CM | POA: Diagnosis not present

## 2017-07-23 DIAGNOSIS — O09522 Supervision of elderly multigravida, second trimester: Secondary | ICD-10-CM

## 2017-07-23 DIAGNOSIS — O34219 Maternal care for unspecified type scar from previous cesarean delivery: Secondary | ICD-10-CM

## 2017-07-23 DIAGNOSIS — Z98891 History of uterine scar from previous surgery: Secondary | ICD-10-CM

## 2017-07-23 DIAGNOSIS — O10919 Unspecified pre-existing hypertension complicating pregnancy, unspecified trimester: Secondary | ICD-10-CM

## 2017-07-23 DIAGNOSIS — O0993 Supervision of high risk pregnancy, unspecified, third trimester: Secondary | ICD-10-CM

## 2017-07-23 DIAGNOSIS — O9989 Other specified diseases and conditions complicating pregnancy, childbirth and the puerperium: Secondary | ICD-10-CM

## 2017-07-23 DIAGNOSIS — O0990 Supervision of high risk pregnancy, unspecified, unspecified trimester: Secondary | ICD-10-CM

## 2017-07-23 NOTE — Progress Notes (Signed)
   PRENATAL VISIT NOTE  Subjective:  Rhonda Graves is a 36 y.o. G2P1001 at [redacted]w[redacted]d being seen today for ongoing prenatal care.  She is currently monitored for the following issues for this high-risk pregnancy and has IBS (irritable bowel syndrome); Chronic hypertension in pregnancy; Migraine; Nevus of multiple sites of trunk; Environmental allergies; Female fertility problem; Supervision of high risk pregnancy, antepartum; AMA (advanced maternal age) multigravida 27+, second trimester; History of cesarean delivery; Sinus bradycardia; History of herpes simplex infection; and Edema of right lower extremity on her problem list.  Patient reports back pain, worse with prolonged standing.  No radiation of the pain.  Pain mostly in her left lower back.  Manipulation helpful for brief periods.  Patient noticing intermittent contractions -has had contractions every 5-7 minutes for a couple of hours it is not improved with rest and fluid intake. Contractions: Irregular. Vag. Bleeding: None.  Movement: Present. Denies leaking of fluid.   The following portions of the patient's history were reviewed and updated as appropriate: allergies, current medications, past family history, past medical history, past social history, past surgical history and problem list. Problem list updated.  Objective:   Vitals:   07/23/17 1255  BP: 133/78  Pulse: 75  Weight: 158 lb (71.7 kg)    Fetal Status: Fetal Heart Rate (bpm): NST   Movement: Present     General:  Alert, oriented and cooperative. Patient is in no acute distress.  Skin: Skin is warm and dry. No rash noted.   Cardiovascular: Normal heart rate noted  Respiratory: Normal respiratory effort, no problems with respiration noted  Abdomen: Soft, gravid, appropriate for gestational age. Pain/Pressure: Present     Pelvic:  Cervical exam deferred        MSK: Restriction, tenderness, tissue texture changes, and paraspinal spasm in the lumbar spine  Neuro: Moves all four  extremities with no focal neurological deficit  Extremities: Normal range of motion.  Edema: None  Mental Status: Normal mood and affect. Normal behavior. Normal judgment and thought content.   OSE: Head   Cervical   Thoracic  T4 FSRL, T7 FSRR  Rib  4-5 inhaled left  Lumbar L5 ESRL  Sacrum L/L  Pelvis  right ant innom    Assessment and Plan:  Pregnancy: G2P1001 at [redacted]w[redacted]d  1. Supervision of high risk pregnancy, antepartum FHT normal  2. Chronic hypertension in pregnancy BPP 10/10 BP slowly increasing [place] in 3 weeks for growth.  - [place] MFM OB FOLLOW UP; Future - [place] MFM FETAL BPP WO NON STRESS; Future  3. AMA (advanced maternal age) multigravida 60+, second trimester  4. History of cesarean delivery Wishes TOLAC  5. Back pain affecting pregnancy in second trimester 6. Somatic dysfunction of lumbar region OMT done after patient permission. HVLA technique utilized. Patient tolerated procedure well.    Preterm labor symptoms and general obstetric precautions including but not limited to vaginal bleeding, contractions, leaking of fluid and fetal movement were reviewed in detail with the patient. Please refer to After Visit Summary for other counseling recommendations.  Return in about 7 days (around 07/30/2017) for weekly as scheduled.  Truett Mainland, DO

## 2017-07-23 NOTE — Progress Notes (Signed)

## 2017-07-30 ENCOUNTER — Ambulatory Visit (INDEPENDENT_AMBULATORY_CARE_PROVIDER_SITE_OTHER): Payer: Medicaid Other | Admitting: *Deleted

## 2017-07-30 ENCOUNTER — Ambulatory Visit: Payer: Self-pay

## 2017-07-30 ENCOUNTER — Ambulatory Visit (INDEPENDENT_AMBULATORY_CARE_PROVIDER_SITE_OTHER): Payer: Medicaid Other | Admitting: Obstetrics and Gynecology

## 2017-07-30 VITALS — BP 124/74 | HR 73 | Wt 160.1 lb

## 2017-07-30 DIAGNOSIS — O403XX0 Polyhydramnios, third trimester, not applicable or unspecified: Secondary | ICD-10-CM

## 2017-07-30 DIAGNOSIS — Z8619 Personal history of other infectious and parasitic diseases: Secondary | ICD-10-CM

## 2017-07-30 DIAGNOSIS — O0990 Supervision of high risk pregnancy, unspecified, unspecified trimester: Secondary | ICD-10-CM

## 2017-07-30 DIAGNOSIS — O09523 Supervision of elderly multigravida, third trimester: Secondary | ICD-10-CM

## 2017-07-30 DIAGNOSIS — O10919 Unspecified pre-existing hypertension complicating pregnancy, unspecified trimester: Secondary | ICD-10-CM

## 2017-07-30 DIAGNOSIS — Z98891 History of uterine scar from previous surgery: Secondary | ICD-10-CM

## 2017-07-30 DIAGNOSIS — O10913 Unspecified pre-existing hypertension complicating pregnancy, third trimester: Secondary | ICD-10-CM | POA: Diagnosis not present

## 2017-07-30 DIAGNOSIS — M62838 Other muscle spasm: Secondary | ICD-10-CM

## 2017-07-30 LAB — POCT URINALYSIS DIP (DEVICE)
Bilirubin Urine: NEGATIVE
Glucose, UA: NEGATIVE mg/dL
HGB URINE DIPSTICK: NEGATIVE
Ketones, ur: NEGATIVE mg/dL
Nitrite: NEGATIVE
Protein, ur: NEGATIVE mg/dL
SPECIFIC GRAVITY, URINE: 1.015 (ref 1.005–1.030)
UROBILINOGEN UA: 0.2 mg/dL (ref 0.0–1.0)
pH: 7 (ref 5.0–8.0)

## 2017-07-30 NOTE — Progress Notes (Signed)
Prenatal Visit Note Date: 07/30/2017 Clinic: [facility]  Subjective:  Rhonda Graves is a 36 y.o. G2P1001 at [redacted]w[redacted]d being seen today for ongoing prenatal care.  She is currently monitored for the following issues for this high-risk pregnancy and has IBS (irritable bowel syndrome); Polyhydramnios affecting pregnancy in third trimester; Chronic hypertension in pregnancy; Migraine; Nevus of multiple sites of trunk; Environmental allergies; Female fertility problem; Supervision of high risk pregnancy, antepartum; AMA (advanced maternal age) multigravida 14+, second trimester; History of cesarean delivery; Sinus bradycardia; History of herpes simplex infection; Edema of right lower extremity; and Muscle spasms of neck on their problem list.  Patient reports right sided neck and back spams since last week when tried to move dresser. didn't help with prenatal massage.   Contractions: Irregular. Vag. Bleeding: None.  Movement: Present. Denies leaking of fluid.   The following portions of the patient's history were reviewed and updated as appropriate: allergies, current medications, past family history, past medical history, past social history, past surgical history and problem list. Problem list updated.  Objective:   Vitals:   07/30/17 1304  BP: 124/74  Pulse: 73  Weight: 160 lb 1.6 oz (72.6 kg)    Fetal Status: Fetal Heart Rate (bpm): NST   Movement: Present  Presentation: Vertex  General:  Alert, oriented and cooperative. Patient is in no acute distress.  Skin: Skin is warm and dry. No rash noted.   Cardiovascular: Normal heart rate noted  Respiratory: Normal respiratory effort, no problems with respiration noted  Abdomen: Soft, gravid, appropriate for gestational age. Pain/Pressure: Present     Pelvic:  Cervical exam deferred        Extremities: Normal range of motion.  Edema: None  Mental Status: Normal mood and affect. Normal behavior. Normal judgment and thought  content.   Urinalysis:      Assessment and Plan:  Pregnancy: G2P1001 at [redacted]w[redacted]d  1. Supervision of high risk pregnancy, antepartum Routine care. D/w pt re: BC nv - Strep Gp B NAA  2. Chronic hypertension in pregnancy Doing well on no meds. rNST today (140 baseline, +accels, no decel, mod variability, toco quiet x 44m) and BPP 8/8 (mild poly seen). Rpt one week. Normal EFW/AC on 10/24. Has rpt already scheduled  3. AMA (advanced maternal age) multigravida 19+, third trimester No issues  4. Muscle spasms of neck - Ambulatory referral to Physical Therapy  5. Polyhydramnios affecting pregnancy in third trimester No change in plan of care. If persists at 38wks, and especially if favorable cervix, consider 39wk IOL vs 40wks given cHTN, AMA high risk issues already present  6. History of cesarean delivery Had in 2015 pLTCS for arrest of dilation. Note says 6 but patient says 8cm, but she never got to 2nd stage and it was an IOL for poly. She still desires TOLAC; risks again reviewed with her and d/w her re: IOL and she would like that prn  7. History of herpes simplex infection D/w pt nv if still on ppx valtrex.   Preterm labor symptoms and general obstetric precautions including but not limited to vaginal bleeding, contractions, leaking of fluid and fetal movement were reviewed in detail with the patient. Please refer to After Visit Summary for other counseling recommendations.  Return in about 7 days (around 08/06/2017) for as scheduled.   Aletha Halim, MD

## 2017-07-30 NOTE — Progress Notes (Signed)

## 2017-08-01 LAB — STREP GP B NAA: Strep Gp B NAA: NEGATIVE

## 2017-08-06 ENCOUNTER — Ambulatory Visit (INDEPENDENT_AMBULATORY_CARE_PROVIDER_SITE_OTHER): Payer: Medicaid Other | Admitting: Obstetrics & Gynecology

## 2017-08-06 ENCOUNTER — Ambulatory Visit: Payer: Self-pay

## 2017-08-06 ENCOUNTER — Ambulatory Visit (INDEPENDENT_AMBULATORY_CARE_PROVIDER_SITE_OTHER): Payer: Medicaid Other | Admitting: *Deleted

## 2017-08-06 ENCOUNTER — Encounter: Payer: Self-pay | Admitting: Obstetrics & Gynecology

## 2017-08-06 VITALS — BP 123/74 | HR 72 | Wt 161.0 lb

## 2017-08-06 DIAGNOSIS — O10913 Unspecified pre-existing hypertension complicating pregnancy, third trimester: Secondary | ICD-10-CM | POA: Diagnosis not present

## 2017-08-06 DIAGNOSIS — O10919 Unspecified pre-existing hypertension complicating pregnancy, unspecified trimester: Secondary | ICD-10-CM

## 2017-08-06 DIAGNOSIS — O09523 Supervision of elderly multigravida, third trimester: Secondary | ICD-10-CM

## 2017-08-06 DIAGNOSIS — O0990 Supervision of high risk pregnancy, unspecified, unspecified trimester: Secondary | ICD-10-CM

## 2017-08-06 LAB — POCT URINALYSIS DIP (DEVICE)
BILIRUBIN URINE: NEGATIVE
Glucose, UA: NEGATIVE mg/dL
HGB URINE DIPSTICK: NEGATIVE
KETONES UR: NEGATIVE mg/dL
Nitrite: NEGATIVE
Protein, ur: NEGATIVE mg/dL
Specific Gravity, Urine: 1.02 (ref 1.005–1.030)
Urobilinogen, UA: 0.2 mg/dL (ref 0.0–1.0)
pH: 7 (ref 5.0–8.0)

## 2017-08-06 NOTE — Progress Notes (Signed)

## 2017-08-06 NOTE — Progress Notes (Signed)
   PRENATAL VISIT NOTE  Subjective:  Rhonda Graves is a 36 y.o. G2P1001 at [redacted]w[redacted]d being seen today for ongoing prenatal care.  She is currently monitored for the following issues for this low-risk pregnancy and has IBS (irritable bowel syndrome); Polyhydramnios affecting pregnancy in third trimester; Chronic hypertension in pregnancy; Migraine; Nevus of multiple sites of trunk; Environmental allergies; Female fertility problem; Supervision of high risk pregnancy, antepartum; AMA (advanced maternal age) multigravida 41+, second trimester; History of cesarean delivery; Sinus bradycardia; Edema of right lower extremity; and Muscle spasms of neck on their problem list.  Patient reports no complaints.  Contractions: Irregular. Vag. Bleeding: None.  Movement: Present. Denies leaking of fluid.   The following portions of the patient's history were reviewed and updated as appropriate: allergies, current medications, past family history, past medical history, past social history, past surgical history and problem list. Problem list updated.  Objective:   Vitals:   08/06/17 1250  BP: 123/74  Pulse: 72  Weight: 161 lb (73 kg)    Fetal Status: Fetal Heart Rate (bpm): NST   Movement: Present     General:  Alert, oriented and cooperative. Patient is in no acute distress.  Skin: Skin is warm and dry. No rash noted.   Cardiovascular: Normal heart rate noted  Respiratory: Normal respiratory effort, no problems with respiration noted  Abdomen: Soft, gravid, appropriate for gestational age.  Pain/Pressure: Present     Pelvic: Cervical exam deferred        Extremities: Normal range of motion.  Edema: None  Mental Status:  Normal mood and affect. Normal behavior. Normal judgment and thought content.   BPP 10/10.  High normal AFI at 23 cm.    Assessment and Plan:  Pregnancy: G2P1001 at [redacted]w[redacted]d  1. Chronic hypertension in pregnancy Stable BP. Desires IOL at 39 weeks, will schedule. Orders placed.  2. AMA  (advanced maternal age) multigravida 36+, third trimester 3. Supervision of high risk pregnancy, antepartum Term labor symptoms and general obstetric precautions including but not limited to vaginal bleeding, contractions, leaking of fluid and fetal movement were reviewed in detail with the patient. Please refer to After Visit Summary for other counseling recommendations.  Return in about 6 days (around 08/12/2017) for visit as scheduled.   Verita Schneiders, MD

## 2017-08-06 NOTE — Patient Instructions (Signed)
Return to clinic for any scheduled appointments or obstetric concerns, or go to [facility]U for evaluation  

## 2017-08-06 NOTE — Progress Notes (Signed)
Pt states she had a slight fall on 11/9. She slipped and her bottom hit the floor. She reports no abdominal pain following the incident and has had good FM daily. [place] for growth and BPP scheduled on 11/20

## 2017-08-07 ENCOUNTER — Telehealth (HOSPITAL_COMMUNITY): Payer: Self-pay | Admitting: *Deleted

## 2017-08-07 NOTE — Telephone Encounter (Signed)
Preadmission screen  

## 2017-08-08 ENCOUNTER — Telehealth (HOSPITAL_COMMUNITY): Payer: Self-pay | Admitting: *Deleted

## 2017-08-08 NOTE — Telephone Encounter (Signed)
Preadmission screen  

## 2017-08-11 ENCOUNTER — Other Ambulatory Visit: Payer: Self-pay | Admitting: Advanced Practice Midwife

## 2017-08-12 ENCOUNTER — Ambulatory Visit (HOSPITAL_COMMUNITY)
Admission: RE | Admit: 2017-08-12 | Discharge: 2017-08-12 | Disposition: A | Discharge status: Home / Self Care | Payer: Medicaid Other | Source: Ambulatory Visit | Attending: Family Medicine | Admitting: Family Medicine

## 2017-08-12 ENCOUNTER — Ambulatory Visit (INDEPENDENT_AMBULATORY_CARE_PROVIDER_SITE_OTHER): Payer: Medicaid Other | Admitting: *Deleted

## 2017-08-12 ENCOUNTER — Ambulatory Visit (INDEPENDENT_AMBULATORY_CARE_PROVIDER_SITE_OTHER): Payer: Medicaid Other | Admitting: Family Medicine

## 2017-08-12 VITALS — BP 126/88 | HR 74 | Wt 162.0 lb

## 2017-08-12 VITALS — BP 120/76 | HR 67

## 2017-08-12 DIAGNOSIS — O09523 Supervision of elderly multigravida, third trimester: Secondary | ICD-10-CM

## 2017-08-12 DIAGNOSIS — O403XX0 Polyhydramnios, third trimester, not applicable or unspecified: Secondary | ICD-10-CM

## 2017-08-12 DIAGNOSIS — O10919 Unspecified pre-existing hypertension complicating pregnancy, unspecified trimester: Secondary | ICD-10-CM

## 2017-08-12 DIAGNOSIS — Z3A38 38 weeks gestation of pregnancy: Secondary | ICD-10-CM | POA: Insufficient documentation

## 2017-08-12 DIAGNOSIS — O10913 Unspecified pre-existing hypertension complicating pregnancy, third trimester: Secondary | ICD-10-CM

## 2017-08-12 DIAGNOSIS — O0993 Supervision of high risk pregnancy, unspecified, third trimester: Secondary | ICD-10-CM

## 2017-08-12 DIAGNOSIS — O09522 Supervision of elderly multigravida, second trimester: Secondary | ICD-10-CM

## 2017-08-12 DIAGNOSIS — O34219 Maternal care for unspecified type scar from previous cesarean delivery: Secondary | ICD-10-CM

## 2017-08-12 DIAGNOSIS — Z98891 History of uterine scar from previous surgery: Secondary | ICD-10-CM

## 2017-08-12 DIAGNOSIS — O0990 Supervision of high risk pregnancy, unspecified, unspecified trimester: Secondary | ICD-10-CM

## 2017-08-12 NOTE — Progress Notes (Signed)
   PRENATAL VISIT NOTE  Subjective:  Rhonda Graves is a 36 y.o. G2P1001 at [redacted]w[redacted]d being seen today for ongoing prenatal care.  She is currently monitored for the following issues for this high-risk pregnancy and has IBS (irritable bowel syndrome); Polyhydramnios affecting pregnancy in third trimester; Chronic hypertension in pregnancy; Migraine; Nevus of multiple sites of trunk; Environmental allergies; Female fertility problem; Supervision of high risk pregnancy, antepartum; AMA (advanced maternal age) multigravida 107+, second trimester; History of cesarean delivery; Sinus bradycardia; Edema of right lower extremity; and Muscle spasms of neck on their problem list.  Patient reports no complaints.  Contractions: Irregular. Vag. Bleeding: None.  Movement: Present. Denies leaking of fluid.   The following portions of the patient's history were reviewed and updated as appropriate: allergies, current medications, past family history, past medical history, past social history, past surgical history and problem list. Problem list updated.  Objective:   Vitals:   08/12/17 1145  BP: 126/88  Pulse: 74  Weight: 162 lb (73.5 kg)    Fetal Status: Fetal Heart Rate (bpm): 133   Movement: Present  Presentation: Vertex  General:  Alert, oriented and cooperative. Patient is in no acute distress.  Skin: Skin is warm and dry. No rash noted.   Cardiovascular: Normal heart rate noted  Respiratory: Normal respiratory effort, no problems with respiration noted  Abdomen: Soft, gravid, appropriate for gestational age.  Pain/Pressure: Present     Pelvic: Cervical exam performed Dilation: Fingertip Effacement (%): 70 Station: -2  Extremities: Normal range of motion.  Edema: Trace  Mental Status:  Normal mood and affect. Normal behavior. Normal judgment and thought content.  NST:  Baseline: 125 bpm, Variability: Good {> 6 bpm), Accelerations: Reactive and Decelerations: Absent   Assessment and Plan:  Pregnancy:  G2P1001 at [redacted]w[redacted]d  1. Chronic hypertension in pregnancy On no meds and BP is WNL U/s for growth shows normal growth today  2. AMA (advanced maternal age) multigravida 44+, second trimester   3. Supervision of high risk pregnancy, antepartum Continue prenatal care.   4. Polyhydramnios affecting pregnancy in third trimester nml fluid today  5. History of cesarean delivery For TOLAC  Term labor symptoms and general obstetric precautions including but not limited to vaginal bleeding, contractions, leaking of fluid and fetal movement were reviewed in detail with the patient. Please refer to After Visit Summary for other counseling recommendations.  Return in 6 weeks (on 09/23/2017) for pp check.   Donnamae Jude, MD

## 2017-08-12 NOTE — Patient Instructions (Signed)
Breastfeeding Deciding to breastfeed is one of the best choices you can make for you and your baby. A change in hormones during pregnancy causes your breast tissue to grow and increases the number and size of your milk ducts. These hormones also allow proteins, sugars, and fats from your blood supply to make breast milk in your milk-producing glands. Hormones prevent breast milk from being released before your baby is born as well as prompt milk flow after birth. Once breastfeeding has begun, thoughts of your baby, as well as his or her sucking or crying, can stimulate the release of milk from your milk-producing glands. Benefits of breastfeeding For Your Baby  Your first milk (colostrum) helps your baby's digestive system function better.  There are antibodies in your milk that help your baby fight off infections.  Your baby has a lower incidence of asthma, allergies, and sudden infant death syndrome.  The nutrients in breast milk are better for your baby than infant formulas and are designed uniquely for your baby's needs.  Breast milk improves your baby's brain development.  Your baby is less likely to develop other conditions, such as childhood obesity, asthma, or type 2 diabetes mellitus.  For You  Breastfeeding helps to create a very special bond between you and your baby.  Breastfeeding is convenient. Breast milk is always available at the correct temperature and costs nothing.  Breastfeeding helps to burn calories and helps you lose the weight gained during pregnancy.  Breastfeeding makes your uterus contract to its prepregnancy size faster and slows bleeding (lochia) after you give birth.  Breastfeeding helps to lower your risk of developing type 2 diabetes mellitus, osteoporosis, and breast or ovarian cancer later in life.  Signs that your baby is hungry Early Signs of Hunger  Increased alertness or activity.  Stretching.  Movement of the head from side to  side.  Movement of the head and opening of the mouth when the corner of the mouth or cheek is stroked (rooting).  Increased sucking sounds, smacking lips, cooing, sighing, or squeaking.  Hand-to-mouth movements.  Increased sucking of fingers or hands.  Late Signs of Hunger  Fussing.  Intermittent crying.  Extreme Signs of Hunger Signs of extreme hunger will require calming and consoling before your baby will be able to breastfeed successfully. Do not wait for the following signs of extreme hunger to occur before you initiate breastfeeding:  Restlessness.  A loud, strong cry.  Screaming.  Breastfeeding basics Breastfeeding Initiation  Find a comfortable place to sit or lie down, with your neck and back well supported.  Place a pillow or rolled up blanket under your baby to bring him or her to the level of your breast (if you are seated). Nursing pillows are specially designed to help support your arms and your baby while you breastfeed.  Make sure that your baby's abdomen is facing your abdomen.  Gently massage your breast. With your fingertips, massage from your chest wall toward your nipple in a circular motion. This encourages milk flow. You may need to continue this action during the feeding if your milk flows slowly.  Support your breast with 4 fingers underneath and your thumb above your nipple. Make sure your fingers are well away from your nipple and your baby's mouth.  Stroke your baby's lips gently with your finger or nipple.  When your baby's mouth is open wide enough, quickly bring your baby to your breast, placing your entire nipple and as much of the colored area   around your nipple (areola) as possible into your baby's mouth. ? More areola should be visible above your baby's upper lip than below the lower lip. ? Your baby's tongue should be between his or her lower gum and your breast.  Ensure that your baby's mouth is correctly positioned around your nipple  (latched). Your baby's lips should create a seal on your breast and be turned out (everted).  It is common for your baby to suck about 2-3 minutes in order to start the flow of breast milk.  Latching Teaching your baby how to latch on to your breast properly is very important. An improper latch can cause nipple pain and decreased milk supply for you and poor weight gain in your baby. Also, if your baby is not latched onto your nipple properly, he or she may swallow some air during feeding. This can make your baby fussy. Burping your baby when you switch breasts during the feeding can help to get rid of the air. However, teaching your baby to latch on properly is still the best way to prevent fussiness from swallowing air while breastfeeding. Signs that your baby has successfully latched on to your nipple:  Silent tugging or silent sucking, without causing you pain.  Swallowing heard between every 3-4 sucks.  Muscle movement above and in front of his or her ears while sucking.  Signs that your baby has not successfully latched on to nipple:  Sucking sounds or smacking sounds from your baby while breastfeeding.  Nipple pain.  If you think your baby has not latched on correctly, slip your finger into the corner of your baby's mouth to break the suction and place it between your baby's gums. Attempt breastfeeding initiation again. Signs of Successful Breastfeeding Signs from your baby:  A gradual decrease in the number of sucks or complete cessation of sucking.  Falling asleep.  Relaxation of his or her body.  Retention of a small amount of milk in his or her mouth.  Letting go of your breast by himself or herself.  Signs from you:  Breasts that have increased in firmness, weight, and size 1-3 hours after feeding.  Breasts that are softer immediately after breastfeeding.  Increased milk volume, as well as a change in milk consistency and color by the fifth day of  breastfeeding.  Nipples that are not sore, cracked, or bleeding.  Signs That Your Baby is Getting Enough Milk  Wetting at least 1-2 diapers during the first 24 hours after birth.  Wetting at least 5-6 diapers every 24 hours for the first week after birth. The urine should be clear or pale yellow by 5 days after birth.  Wetting 6-8 diapers every 24 hours as your baby continues to grow and develop.  At least 3 stools in a 24-hour period by age 5 days. The stool should be soft and yellow.  At least 3 stools in a 24-hour period by age 7 days. The stool should be seedy and yellow.  No loss of weight greater than 10% of birth weight during the first 3 days of age.  Average weight gain of 4-7 ounces (113-198 g) per week after age 4 days.  Consistent daily weight gain by age 5 days, without weight loss after the age of 2 weeks.  After a feeding, your baby may spit up a small amount. This is common. Breastfeeding frequency and duration Frequent feeding will help you make more milk and can prevent sore nipples and breast engorgement. Breastfeed when   you feel the need to reduce the fullness of your breasts or when your baby shows signs of hunger. This is called "breastfeeding on demand." Avoid introducing a pacifier to your baby while you are working to establish breastfeeding (the first 4-6 weeks after your baby is born). After this time you may choose to use a pacifier. Research has shown that pacifier use during the first year of a baby's life decreases the risk of sudden infant death syndrome (SIDS). Allow your baby to feed on each breast as long as he or she wants. Breastfeed until your baby is finished feeding. When your baby unlatches or falls asleep while feeding from the first breast, offer the second breast. Because newborns are often sleepy in the first few weeks of life, you may need to awaken your baby to get him or her to feed. Breastfeeding times will vary from baby to baby. However,  the following rules can serve as a guide to help you ensure that your baby is properly fed:  Newborns (babies 4 weeks of age or younger) may breastfeed every 1-3 hours.  Newborns should not go longer than 3 hours during the day or 5 hours during the night without breastfeeding.  You should breastfeed your baby a minimum of 8 times in a 24-hour period until you begin to introduce solid foods to your baby at around 6 months of age.  Breast milk pumping Pumping and storing breast milk allows you to ensure that your baby is exclusively fed your breast milk, even at times when you are unable to breastfeed. This is especially important if you are going back to work while you are still breastfeeding or when you are not able to be present during feedings. Your lactation consultant can give you guidelines on how long it is safe to store breast milk. A breast pump is a machine that allows you to pump milk from your breast into a sterile bottle. The pumped breast milk can then be stored in a refrigerator or freezer. Some breast pumps are operated by hand, while others use electricity. Ask your lactation consultant which type will work best for you. Breast pumps can be purchased, but some hospitals and breastfeeding support groups lease breast pumps on a monthly basis. A lactation consultant can teach you how to hand express breast milk, if you prefer not to use a pump. Caring for your breasts while you breastfeed Nipples can become dry, cracked, and sore while breastfeeding. The following recommendations can help keep your breasts moisturized and healthy:  Avoid using soap on your nipples.  Wear a supportive bra. Although not required, special nursing bras and tank tops are designed to allow access to your breasts for breastfeeding without taking off your entire bra or top. Avoid wearing underwire-style bras or extremely tight bras.  Air dry your nipples for 3-4minutes after each feeding.  Use only cotton  bra pads to absorb leaked breast milk. Leaking of breast milk between feedings is normal.  Use lanolin on your nipples after breastfeeding. Lanolin helps to maintain your skin's normal moisture barrier. If you use pure lanolin, you do not need to wash it off before feeding your baby again. Pure lanolin is not toxic to your baby. You may also hand express a few drops of breast milk and gently massage that milk into your nipples and allow the milk to air dry.  In the first few weeks after giving birth, some women experience extremely full breasts (engorgement). Engorgement can make your   breasts feel heavy, warm, and tender to the touch. Engorgement peaks within 3-5 days after you give birth. The following recommendations can help ease engorgement:  Completely empty your breasts while breastfeeding or pumping. You may want to start by applying warm, moist heat (in the shower or with warm water-soaked hand towels) just before feeding or pumping. This increases circulation and helps the milk flow. If your baby does not completely empty your breasts while breastfeeding, pump any extra milk after he or she is finished.  Wear a snug bra (nursing or regular) or tank top for 1-2 days to signal your body to slightly decrease milk production.  Apply ice packs to your breasts, unless this is too uncomfortable for you.  Make sure that your baby is latched on and positioned properly while breastfeeding.  If engorgement persists after 48 hours of following these recommendations, contact your health care provider or a lactation consultant. Overall health care recommendations while breastfeeding  Eat healthy foods. Alternate between meals and snacks, eating 3 of each per day. Because what you eat affects your breast milk, some of the foods may make your baby more irritable than usual. Avoid eating these foods if you are sure that they are negatively affecting your baby.  Drink milk, fruit juice, and water to  satisfy your thirst (about 10 glasses a day).  Rest often, relax, and continue to take your prenatal vitamins to prevent fatigue, stress, and anemia.  Continue breast self-awareness checks.  Avoid chewing and smoking tobacco. Chemicals from cigarettes that pass into breast milk and exposure to secondhand smoke may harm your baby.  Avoid alcohol and drug use, including marijuana. Some medicines that may be harmful to your baby can pass through breast milk. It is important to ask your health care provider before taking any medicine, including all over-the-counter and prescription medicine as well as vitamin and herbal supplements. It is possible to become pregnant while breastfeeding. If birth control is desired, ask your health care provider about options that will be safe for your baby. Contact a health care provider if:  You feel like you want to stop breastfeeding or have become frustrated with breastfeeding.  You have painful breasts or nipples.  Your nipples are cracked or bleeding.  Your breasts are red, tender, or warm.  You have a swollen area on either breast.  You have a fever or chills.  You have nausea or vomiting.  You have drainage other than breast milk from your nipples.  Your breasts do not become full before feedings by the fifth day after you give birth.  You feel sad and depressed.  Your baby is too sleepy to eat well.  Your baby is having trouble sleeping.  Your baby is wetting less than 3 diapers in a 24-hour period.  Your baby has less than 3 stools in a 24-hour period.  Your baby's skin or the white part of his or her eyes becomes yellow.  Your baby is not gaining weight by 5 days of age. Get help right away if:  Your baby is overly tired (lethargic) and does not want to wake up and feed.  Your baby develops an unexplained fever. This information is not intended to replace advice given to you by your health care provider. Make sure you discuss  any questions you have with your health care provider. Document Released: 09/09/2005 Document Revised: 02/21/2016 Document Reviewed: 03/03/2013 Elsevier Interactive Patient Education  2017 [employer]  

## 2017-08-12 NOTE — Progress Notes (Signed)
[place] for growth and BPP today.  IOL scheduled 11/24 @ midnight. Marland Kitchen

## 2017-08-13 ENCOUNTER — Other Ambulatory Visit: Payer: Medicaid Other

## 2017-08-13 ENCOUNTER — Encounter: Payer: Medicaid Other | Admitting: Obstetrics & Gynecology

## 2017-08-16 ENCOUNTER — Encounter (HOSPITAL_COMMUNITY): Payer: Self-pay | Admitting: Anesthesiology

## 2017-08-16 ENCOUNTER — Inpatient Hospital Stay (HOSPITAL_COMMUNITY)
Admission: RE | Admit: 2017-08-16 | Discharge: 2017-08-19 | DRG: 788 | Disposition: A | Discharge status: Home / Self Care | Payer: Medicaid Other | Source: Ambulatory Visit | Attending: Obstetrics and Gynecology | Admitting: Obstetrics and Gynecology

## 2017-08-16 ENCOUNTER — Inpatient Hospital Stay (HOSPITAL_COMMUNITY): Payer: Medicaid Other | Admitting: Anesthesiology

## 2017-08-16 ENCOUNTER — Other Ambulatory Visit: Payer: Self-pay

## 2017-08-16 DIAGNOSIS — O9902 Anemia complicating childbirth: Secondary | ICD-10-CM | POA: Diagnosis present

## 2017-08-16 DIAGNOSIS — O99214 Obesity complicating childbirth: Secondary | ICD-10-CM | POA: Diagnosis present

## 2017-08-16 DIAGNOSIS — O34211 Maternal care for low transverse scar from previous cesarean delivery: Secondary | ICD-10-CM | POA: Diagnosis present

## 2017-08-16 DIAGNOSIS — O09522 Supervision of elderly multigravida, second trimester: Secondary | ICD-10-CM | POA: Diagnosis present

## 2017-08-16 DIAGNOSIS — O0990 Supervision of high risk pregnancy, unspecified, unspecified trimester: Secondary | ICD-10-CM

## 2017-08-16 DIAGNOSIS — O1002 Pre-existing essential hypertension complicating childbirth: Principal | ICD-10-CM | POA: Diagnosis present

## 2017-08-16 DIAGNOSIS — O09523 Supervision of elderly multigravida, third trimester: Secondary | ICD-10-CM | POA: Diagnosis not present

## 2017-08-16 DIAGNOSIS — T7840XA Allergy, unspecified, initial encounter: Secondary | ICD-10-CM | POA: Diagnosis not present

## 2017-08-16 DIAGNOSIS — I10 Essential (primary) hypertension: Secondary | ICD-10-CM | POA: Diagnosis present

## 2017-08-16 DIAGNOSIS — K219 Gastro-esophageal reflux disease without esophagitis: Secondary | ICD-10-CM | POA: Diagnosis present

## 2017-08-16 DIAGNOSIS — Z98891 History of uterine scar from previous surgery: Secondary | ICD-10-CM

## 2017-08-16 DIAGNOSIS — O1092 Unspecified pre-existing hypertension complicating childbirth: Secondary | ICD-10-CM | POA: Diagnosis not present

## 2017-08-16 DIAGNOSIS — O9962 Diseases of the digestive system complicating childbirth: Secondary | ICD-10-CM | POA: Diagnosis present

## 2017-08-16 DIAGNOSIS — Z3A39 39 weeks gestation of pregnancy: Secondary | ICD-10-CM

## 2017-08-16 DIAGNOSIS — D649 Anemia, unspecified: Secondary | ICD-10-CM | POA: Diagnosis present

## 2017-08-16 LAB — COMPREHENSIVE METABOLIC PANEL
ALT: 14 U/L (ref 14–54)
AST: 23 U/L (ref 15–41)
Albumin: 2.8 g/dL — ABNORMAL LOW (ref 3.5–5.0)
Alkaline Phosphatase: 159 U/L — ABNORMAL HIGH (ref 38–126)
Anion gap: 9 (ref 5–15)
BUN: 13 mg/dL (ref 6–20)
CHLORIDE: 106 mmol/L (ref 101–111)
CO2: 20 mmol/L — AB (ref 22–32)
Calcium: 9.4 mg/dL (ref 8.9–10.3)
Creatinine, Ser: 0.73 mg/dL (ref 0.44–1.00)
Glucose, Bld: 90 mg/dL (ref 65–99)
POTASSIUM: 3.7 mmol/L (ref 3.5–5.1)
SODIUM: 135 mmol/L (ref 135–145)
Total Bilirubin: 0.6 mg/dL (ref 0.3–1.2)
Total Protein: 6.4 g/dL — ABNORMAL LOW (ref 6.5–8.1)

## 2017-08-16 LAB — RPR: RPR: NONREACTIVE

## 2017-08-16 LAB — TYPE AND SCREEN
ABO/RH(D): A POS
Antibody Screen: NEGATIVE

## 2017-08-16 LAB — NO BLOOD PRODUCTS

## 2017-08-16 LAB — CBC
HCT: 34.9 % — ABNORMAL LOW (ref 36.0–46.0)
Hemoglobin: 11.8 g/dL — ABNORMAL LOW (ref 12.0–15.0)
MCH: 32.9 pg (ref 26.0–34.0)
MCHC: 33.8 g/dL (ref 30.0–36.0)
MCV: 97.2 fL (ref 78.0–100.0)
PLATELETS: 225 10*3/uL (ref 150–400)
RBC: 3.59 MIL/uL — AB (ref 3.87–5.11)
RDW: 13.3 % (ref 11.5–15.5)
WBC: 9.5 10*3/uL (ref 4.0–10.5)

## 2017-08-16 MED ORDER — OXYCODONE-ACETAMINOPHEN 5-325 MG PO TABS: 2.0000 | ORAL_TABLET | ORAL | Status: DC | PRN

## 2017-08-16 MED ORDER — DIPHENHYDRAMINE HCL 50 MG/ML IJ SOLN: 12.5000 mg | INTRAMUSCULAR | Status: DC | PRN

## 2017-08-16 MED ORDER — OXYTOCIN 40 UNITS IN LACTATED RINGERS INFUSION - SIMPLE MED
1.0000 m[IU]/min | INTRAVENOUS | Status: DC
  Filled 2017-08-16: qty 1000

## 2017-08-16 MED ORDER — ONDANSETRON HCL 4 MG/2ML IJ SOLN: 4.0000 mg | Freq: Four times a day (QID) | INTRAMUSCULAR | Status: DC | PRN

## 2017-08-16 MED ORDER — FLEET ENEMA 7-19 GM/118ML RE ENEM: 1.0000 | ENEMA | Freq: Every day | RECTAL | Status: DC | PRN

## 2017-08-16 MED ORDER — LACTATED RINGERS IV SOLN
500.0000 mL | INTRAVENOUS | Status: DC | PRN
  Administered 2017-08-17: 500 mL via INTRAVENOUS

## 2017-08-16 MED ORDER — FENTANYL 2.5 MCG/ML BUPIVACAINE 1/10 % EPIDURAL INFUSION (WH - ANES)
14.0000 mL/h | INTRAMUSCULAR | Status: DC | PRN
  Administered 2017-08-16: 14 mL/h via EPIDURAL
  Filled 2017-08-16: qty 100

## 2017-08-16 MED ORDER — FENTANYL 2.5 MCG/ML BUPIVACAINE 1/10 % EPIDURAL INFUSION (WH - ANES): 14.0000 mL/h | INTRAMUSCULAR | Status: DC | PRN

## 2017-08-16 MED ORDER — SOD CITRATE-CITRIC ACID 500-334 MG/5ML PO SOLN
30.0000 mL | ORAL | Status: DC | PRN
  Administered 2017-08-16 – 2017-08-17 (×2): 30 mL via ORAL
  Filled 2017-08-16 (×2): qty 15

## 2017-08-16 MED ORDER — ACETAMINOPHEN 325 MG PO TABS: 650.0000 mg | ORAL_TABLET | ORAL | Status: DC | PRN

## 2017-08-16 MED ORDER — OXYTOCIN 40 UNITS IN LACTATED RINGERS INFUSION - SIMPLE MED
2.5000 [IU]/h | INTRAVENOUS | Status: DC
  Administered 2017-08-17: 2.5 [IU]/h via INTRAVENOUS

## 2017-08-16 MED ORDER — EPHEDRINE 5 MG/ML INJ: 10.0000 mg | INTRAVENOUS | Status: DC | PRN

## 2017-08-16 MED ORDER — FENTANYL CITRATE (PF) 100 MCG/2ML IJ SOLN: 100.0000 ug | INTRAMUSCULAR | Status: DC | PRN

## 2017-08-16 MED ORDER — LACTATED RINGERS IV SOLN: 500.0000 mL | Freq: Once | INTRAVENOUS | Status: DC

## 2017-08-16 MED ORDER — OXYCODONE-ACETAMINOPHEN 5-325 MG PO TABS: 1.0000 | ORAL_TABLET | ORAL | Status: DC | PRN

## 2017-08-16 MED ORDER — OXYTOCIN 40 UNITS IN LACTATED RINGERS INFUSION - SIMPLE MED
1.0000 m[IU]/min | INTRAVENOUS | Status: DC
  Administered 2017-08-17: 8 m[IU]/min via INTRAVENOUS

## 2017-08-16 MED ORDER — ZOLPIDEM TARTRATE 5 MG PO TABS: 5.0000 mg | ORAL_TABLET | Freq: Every evening | ORAL | Status: DC | PRN

## 2017-08-16 MED ORDER — HYDROXYZINE HCL 50 MG PO TABS: 50.0000 mg | ORAL_TABLET | Freq: Four times a day (QID) | ORAL | Status: DC | PRN

## 2017-08-16 MED ORDER — LACTATED RINGERS IV SOLN
500.0000 mL | Freq: Once | INTRAVENOUS | Status: AC
  Administered 2017-08-16: 500 mL via INTRAVENOUS

## 2017-08-16 MED ORDER — OXYTOCIN BOLUS FROM INFUSION: 500.0000 mL | Freq: Once | INTRAVENOUS | Status: DC

## 2017-08-16 MED ORDER — OXYTOCIN 40 UNITS IN LACTATED RINGERS INFUSION - SIMPLE MED
1.0000 m[IU]/min | INTRAVENOUS | Status: DC
  Administered 2017-08-16: 1 m[IU]/min via INTRAVENOUS

## 2017-08-16 MED ORDER — FENTANYL CITRATE (PF) 100 MCG/2ML IJ SOLN: 50.0000 ug | INTRAMUSCULAR | Status: DC | PRN

## 2017-08-16 MED ORDER — DIPHENHYDRAMINE HCL 50 MG/ML IJ SOLN
12.5000 mg | INTRAMUSCULAR | Status: DC | PRN
  Administered 2017-08-17: 12.5 mg via INTRAVENOUS
  Filled 2017-08-16: qty 1

## 2017-08-16 MED ORDER — TERBUTALINE SULFATE 1 MG/ML IJ SOLN
0.2500 mg | Freq: Once | INTRAMUSCULAR | Status: DC | PRN
  Filled 2017-08-16: qty 1

## 2017-08-16 MED ORDER — TERBUTALINE SULFATE 1 MG/ML IJ SOLN
0.2500 mg | Freq: Once | INTRAMUSCULAR | Status: AC | PRN
  Administered 2017-08-17: 0.25 mg via SUBCUTANEOUS

## 2017-08-16 MED ORDER — PHENYLEPHRINE 40 MCG/ML (10ML) SYRINGE FOR IV PUSH (FOR BLOOD PRESSURE SUPPORT): 80.0000 ug | PREFILLED_SYRINGE | INTRAVENOUS | Status: DC | PRN

## 2017-08-16 MED ORDER — LIDOCAINE HCL (PF) 1 % IJ SOLN
INTRAMUSCULAR | Status: DC | PRN
  Administered 2017-08-16: 13 mL via EPIDURAL

## 2017-08-16 MED ORDER — PHENYLEPHRINE 40 MCG/ML (10ML) SYRINGE FOR IV PUSH (FOR BLOOD PRESSURE SUPPORT)
80.0000 ug | PREFILLED_SYRINGE | INTRAVENOUS | Status: DC | PRN
  Filled 2017-08-16: qty 10

## 2017-08-16 MED ORDER — LIDOCAINE HCL (PF) 1 % IJ SOLN
30.0000 mL | INTRAMUSCULAR | Status: DC | PRN
  Filled 2017-08-16: qty 30

## 2017-08-16 MED ORDER — LACTATED RINGERS IV SOLN
INTRAVENOUS | Status: DC
  Administered 2017-08-16 – 2017-08-17 (×4): via INTRAVENOUS

## 2017-08-16 NOTE — Anesthesia Procedure Notes (Signed)
Epidural Patient location during procedure: OB Start time: 08/16/2017 10:37 PM End time: 08/16/2017 10:51 PM  Staffing Anesthesiologist: Lynda Rainwater, MD Performed: anesthesiologist   Preanesthetic Checklist Completed: patient identified, site marked, surgical consent, pre-op evaluation, timeout performed, IV checked, risks and benefits discussed and monitors and equipment checked  Epidural Patient position: sitting Prep: ChloraPrep Patient monitoring: heart rate, cardiac monitor, continuous pulse ox and blood pressure Approach: midline Location: L2-L3 Injection technique: LOR saline  Needle:  Needle type: Tuohy  Needle gauge: 17 G Needle length: 9 cm Needle insertion depth: 4 cm Catheter type: closed end flexible Catheter size: 20 Guage Catheter at skin depth: 8 cm Test dose: negative  Assessment Events: blood not aspirated, injection not painful, no injection resistance, negative IV test and no paresthesia  Additional Notes Reason for block:procedure for pain

## 2017-08-16 NOTE — Anesthesia Pain Management Evaluation Note (Signed)
  CRNA Pain Management Visit Note  Patient: Rhonda Graves, 36 y.o., female  "Hello I am a member of the anesthesia team at [facility]. We have an anesthesia team available at all times to provide care throughout the hospital, including epidural management and anesthesia for C-section. I don't know your plan for the delivery whether it a natural birth, water birth, IV sedation, nitrous supplementation, doula or epidural, but we want to meet your pain goals."   1.Was your pain managed to your expectations on prior hospitalizations?   Yes   2.What is your expectation for pain management during this hospitalization?     Epidural  3.How can we help you reach that goal? unsure  Record the patient's initial score and the patient's pain goal.   Pain: 4  Pain Goal: 7 [facility] wants you to be able to say your pain was always managed very well.  Casimer Lanius 08/16/2017

## 2017-08-16 NOTE — Progress Notes (Signed)
Pt is a Jehovah witness and has signed a refusal of blood products form. Pt also has a copy of her own Health Care of Attorney Refusal of Blood products  Consent form in her chart. Pt has no blood bank bracelet  on per house coverage.

## 2017-08-16 NOTE — H&P (Signed)
LABOR AND DELIVERY ADMISSION HISTORY AND PHYSICAL NOTE  Rhonda Graves is a 36 y.o. female G2P1001 with IUP at [redacted]w[redacted]d by LMP presenting for IOL for chronic hypertension and AMA. Patient did not require any medications for her hypertension during this pregnancy. Patient was on procardia until 13 wk. Patient has a history of cesarean delivery in prior pregnancy. Patient also has polyhydramnios in her third trimester with last US showing normal fluid during her last office visit on 11/20. Patient agree to [facility].   She reports positive fetal movement. She denies leakage of fluid or vaginal bleeding.  Prenatal History/Complications: Prenatal care: [place] -Hx of chronic HTN ( on procardia initally until 13 wk>ASA until 35 wk) -Hx of cesarean delivery -Polyhydramnios resolved  Past Medical History: Past Medical History:  Diagnosis Date  . Abnormal Pap smear    had colpo in past, normal pap since then  . GERD (gastroesophageal reflux disease) Dx 2010  . Headache(784.0)    migraine  . History of oral herpes simplex infection 05/29/2017   +IgM 1/2 antibody screen done for cold sores vs aphthous ulcers  . Hypertension   . IBS (irritable bowel syndrome)   . Multiple allergies     Past Surgical History: Past Surgical History:  Procedure Laterality Date  . CESAREAN SECTION N/A 11/26/2013   Procedure: CESAREAN SECTION;  Surgeon: Lavonia Drafts, MD;  Location: [facility];  Service: Obstetrics;  Laterality: N/A;    Obstetrical History: OB History    Gravida Para Term Preterm AB Living   2 1 1     1    SAB TAB Ectopic Multiple Live Births           1      Social History: Social History   Socioeconomic History  . Marital status: Married    Spouse name: Not on file  . Number of children: 1   . Years of education: college   . Highest education level: Not on file  Social Needs  . Financial resource strain: Not on file  . Food insecurity - worry: Not on file  . Food insecurity - inability:  Not on file  . Transportation needs - medical: Not on file  . Transportation needs - non-medical: Not on file  Occupational History  . Occupation: Interpreter   Tobacco Use  . Smoking status: Never Smoker  . Smokeless tobacco: Never Used  Substance and Sexual Activity  . Alcohol use: No    Comment: Not currently drinking  . Drug use: No  . Sexual activity: Yes    Birth control/protection: None  Other Topics Concern  . Not on file  Social History Narrative   Originally from [place].   Has one daughter.   Lives with husband and daughter.     Family History: Family History  Problem Relation Age of Onset  . Hypothyroidism Mother   . Arthritis Mother   . Asthma Brother     Allergies: Allergies  Allergen Reactions  . Other Anaphylaxis    Walnuts  . 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 Peanut-Containing Drug Products Hives    Facility-Administered Medications Prior to Admission  Medication Dose Route Frequency Provider Last Rate Last Dose  . prenatal vitamin w/FE, FA (NATACHEW) chewable tablet 1 tablet  1 tablet Oral Q1200 Truett Mainland, DO       Medications Prior to Admission  Medication Sig Dispense Refill Last Dose  . acetaminophen (TYLENOL) 325 MG tablet Take 2 tablets (650 mg total) by mouth every 6 (  six) hours as needed. 60 tablet 0 Taking  . aspirin 81 MG chewable tablet Chew 81 mg by mouth daily.   Not Taking  . Cetirizine HCl (ZYRTEC ALLERGY) 10 MG CAPS Take 1 capsule (10 mg total) by mouth daily. 30 capsule 11 Taking  . EPIPEN 2-PAK 0.3 MG/0.3ML SOAJ injection Inject 1 Dose into the muscle as needed for anaphylaxis.  2 Taking  . esomeprazole (NEXIUM) 20 MG capsule Take 1 capsule (20 mg total) by mouth daily at 12 noon. 30 capsule 1 Taking  . Ferrous Gluconate (IRON 27 PO) Take 1 tablet by mouth daily.   Taking  . magic mouthwash SOLN Take 5 mLs by mouth 4 (four) times daily as needed for mouth pain. (Patient not taking: Reported on 07/16/2017) 120 mL 1 Not  Taking  . Prenatal Vit-Fe Fumarate-FA (MULTIVITAMIN-PRENATAL) 27-0.8 MG TABS tablet Take 1 tablet by mouth daily at 12 noon.   Taking  . valACYclovir (VALTREX) 1000 MG tablet Take 1 tablet (1,000 mg total) by mouth 2 (two) times daily. (Patient not taking: Reported on 08/12/2017) 20 tablet 1 Not Taking  . zolpidem (AMBIEN) 5 MG tablet Take 1 tablet (5 mg total) by mouth at bedtime as needed for sleep. 30 tablet 5 Taking     Review of Systems   All systems reviewed and negative except as stated in HPI  Last menstrual period 11/16/2016, currently breastfeeding. General appearance: alert, cooperative and no distress Lungs: clear to auscultation bilaterally Heart: regular rate and rhythm Abdomen: soft, non-tender; bowel sounds normal Extremities: No calf swelling or tenderness Presentation: cephalic Fetal monitoring: HR 150, moderate variability + accel, no decel Uterine activity: irregular contractions     Prenatal labs: ABO, Rh: A/Positive/-- (04/16 1631) Antibody: Negative (04/16 1631) Rubella: 4.57 (04/16 1631) RPR: Non Reactive (09/06 0836)  HBsAg: Negative (04/16 1631)  HIV:  Non reactive GBS: Negative (11/07 1459)  1 hr Glucola: Third trimester- within normal limits Genetic screening: normal Anatomy US: normal  Prenatal Transfer Tool  Maternal Diabetes: No Genetic Screening: Normal Maternal Ultrasounds/Referrals: Normal Fetal Ultrasounds or other Referrals:  None Maternal Substance Abuse:  No Significant Maternal Medications:  Meds include: Nexium Other: procardia (stopped@13w ), ASA (stopped@ 35w), iron supplementation, Zyrtec, Ambien (prn) Significant Maternal Lab Results: Lab values include: Group B Strep negative  No results found for this or any previous visit (from the past 24 hour(s)).  Patient Active Problem List   Diagnosis Date Noted  . Chronic hypertension 08/16/2017  . Muscle spasms of neck 07/30/2017  . Edema of right lower extremity 05/29/2017  . AMA  (advanced maternal age) multigravida 74+, second trimester 03/06/2017  . History of cesarean delivery 03/06/2017  . Sinus bradycardia 03/06/2017  . Supervision of high risk pregnancy, antepartum 01/06/2017  . 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 Environmental allergies 02/02/2016  . Nevus of multiple sites of trunk 09/08/2014  . Chronic hypertension in pregnancy 02/18/2014  . Migraine 02/18/2014  . Polyhydramnios affecting pregnancy in third trimester 11/25/2013  . IBS (irritable bowel syndrome) 04/08/2013    Assessment: Rhonda Graves is a 36 y.o. G2P1001 at [redacted]w[redacted]d here for IOL for chronic hypertension and AMA.  #Labor: Pitocin 1x1, Foley bulb as needed. #Pain: IV pain meds, epidural upon request  #FWB: Cat 1 #ID:  GBS negative #MOF: Breast #MOC: Undecided #Circ:  N/A  Marjie Skiff, MD 08/16/2017, 12:51 AM

## 2017-08-16 NOTE — Anesthesia Preprocedure Evaluation (Signed)
Anesthesia Evaluation  Patient identified by MRN, date of birth, ID band Patient awake    Reviewed: Allergy & Precautions, H&P , NPO status , Patient's Chart, lab work & pertinent test results  Airway Mallampati: II  TM Distance: >3 FB Neck ROM: full    Dental no notable dental hx. (+) Teeth Intact   Pulmonary neg pulmonary ROS,    Pulmonary exam normal breath sounds clear to auscultation       Cardiovascular hypertension, negative cardio ROS Normal cardiovascular exam Rhythm:regular Rate:Normal     Neuro/Psych  Headaches,    GI/Hepatic Neg liver ROS, GERD  Medicated and Controlled,  Endo/Other  Morbid obesity  Renal/GU negative Renal ROS  negative genitourinary   Musculoskeletal negative musculoskeletal ROS (+)   Abdominal (+) + obese,   Peds  Hematology  (+) anemia ,   Anesthesia Other Findings       Reproductive/Obstetrics (+) Pregnancy                             Anesthesia Physical  Anesthesia Plan  ASA: III and emergent  Anesthesia Plan: Epidural   Post-op Pain Management:    Induction:   PONV Risk Score and Plan:   Airway Management Planned: Natural Airway  Additional Equipment:   Intra-op Plan:   Post-operative Plan:   Informed Consent: I have reviewed the patients History and Physical, chart, labs and discussed the procedure including the risks, benefits and alternatives for the proposed anesthesia with the patient or authorized representative who has indicated his/her understanding and acceptance.   Dental Advisory Given  Plan Discussed with:   Anesthesia Plan Comments: (Labs checked- platelets confirmed with RN in room. Fetal heart tracing, per RN, reported to be stable enough for sitting procedure. Discussed epidural, and patient consents to the procedure:  included risk of possible headache,backache, failed block, allergic reaction, and nerve injury.  This patient was asked if she had any questions or concerns before the procedure started. Patient for C/Section for failure to progress. Will use epidural for C/Section.)        Anesthesia Quick Evaluation

## 2017-08-16 NOTE — Progress Notes (Signed)
Patient ID: Rhonda Graves, female   DOB: 1981/04/20, 36 y.o.   MRN: [id number]  Cervical foley came out at 1650; have been increasing Pit since then  BP 120/80, other VSS FHR 130s, +accels, no decels Ctx irreg 3-5 mins with Pit @ 14mu/min Cx post 4/thick/vtx ballotable  IUP@term  cHTN TOLAC Jehovah's Witness- NO blood products IOL process; vtx out of pelvis  Rev'd with pt and family the implications of the vtx not being engaged in the pelvis at all; shared that this is not an encouraging sign for a successful VBAC. Rev'd that currently mom/baby are stable, but we probably don't want to continue this process indefinitely.  Will revisit the issue after some time has passed.  Serita Grammes CNM 08/16/2017 9:29 PM

## 2017-08-16 NOTE — Progress Notes (Addendum)
Patient ID: Glenda Spelman, female   DOB: [birth year], 36 y.o.   MRN: [id number]  Pt doing well on 47mu/min Pit- has been running overnight (was a MN IOL). Doing well; feeling some cramping. Desires to attempt FB placement as soon as possible.  BP 106/61, other VSS FHR 140s, +accels, no decels Ctx irreg Cx 1/thick/soft/vtx -3  IUP@term  cHTN- BP stable without meds TOLAC Cx unfavorable GBS neg  Cook cervical foley placed without difficulty and inflated with 60cc. Will leave in place until it comes out; keep Pit going at 37mu/min. Hopeful for VBAC.  Serita Grammes CNM 08/16/2017

## 2017-08-16 NOTE — Progress Notes (Signed)
Labor Progress Note Rhonda Graves is a 36 y.o. G2P1001 at [redacted]w[redacted]d presented for IOL S: No complaints  O:  BP 110/78   Pulse 79   Temp 97.8 F (36.6 C) (Oral)   Resp 18   Ht 5\' 1"  (1.549 m)   Wt 162 lb (73.5 kg)   LMP 11/16/2016 (Exact Date)   SpO2 99%   BMI 30.61 kg/m  EFM: 120 bpm/mod var/prolonged decel  CVE: Dilation: 1 Effacement (%): 70 Cervical Position: Anterior Station: -3 Presentation: Vertex Exam by:: Vennie Homans   A&P: 36 y.o. G2P1001 [redacted]w[redacted]d here for IOL #Labor: currently on low dose pitocin. Attempted FB placement but internal os still closed. Will try again later. #FWB: cat 2  [name], DO 6:54 AM

## 2017-08-17 ENCOUNTER — Encounter (HOSPITAL_COMMUNITY): Payer: Self-pay

## 2017-08-17 ENCOUNTER — Encounter (HOSPITAL_COMMUNITY)
Admission: RE | Disposition: A | Discharge status: Home / Self Care | Payer: Self-pay | Source: Ambulatory Visit | Attending: Obstetrics and Gynecology

## 2017-08-17 DIAGNOSIS — O09523 Supervision of elderly multigravida, third trimester: Secondary | ICD-10-CM

## 2017-08-17 DIAGNOSIS — O34211 Maternal care for low transverse scar from previous cesarean delivery: Secondary | ICD-10-CM

## 2017-08-17 DIAGNOSIS — Z3A39 39 weeks gestation of pregnancy: Secondary | ICD-10-CM

## 2017-08-17 DIAGNOSIS — O1092 Unspecified pre-existing hypertension complicating childbirth: Secondary | ICD-10-CM

## 2017-08-17 LAB — CBC
HCT: 32 % — ABNORMAL LOW (ref 36.0–46.0)
HCT: 31 % — ABNORMAL LOW (ref 36.0–46.0)
Hemoglobin: 10.8 g/dL — ABNORMAL LOW (ref 12.0–15.0)
Hemoglobin: 10.5 g/dL — ABNORMAL LOW (ref 12.0–15.0)
MCH: 33.1 pg (ref 26.0–34.0)
MCH: 32.9 pg (ref 26.0–34.0)
MCHC: 33.8 g/dL (ref 30.0–36.0)
MCHC: 33.9 g/dL (ref 30.0–36.0)
MCV: 98.2 fL (ref 78.0–100.0)
MCV: 97.2 fL (ref 78.0–100.0)
PLATELETS: 176 10*3/uL (ref 150–400)
PLATELETS: 190 10*3/uL (ref 150–400)
RBC: 3.26 MIL/uL — AB (ref 3.87–5.11)
RBC: 3.19 MIL/uL — ABNORMAL LOW (ref 3.87–5.11)
RDW: 13.2 % (ref 11.5–15.5)
RDW: 13.1 % (ref 11.5–15.5)
WBC: 18.7 10*3/uL — ABNORMAL HIGH (ref 4.0–10.5)
WBC: 18.1 10*3/uL — AB (ref 4.0–10.5)

## 2017-08-17 SURGERY — Surgical Case
Anesthesia: Epidural | Site: Abdomen | Wound class: Clean Contaminated

## 2017-08-17 MED ORDER — METOCLOPRAMIDE HCL 5 MG/ML IJ SOLN
INTRAMUSCULAR | Status: DC | PRN
  Administered 2017-08-17: 5 mg via INTRAVENOUS

## 2017-08-17 MED ORDER — LIDOCAINE-EPINEPHRINE 2 %-1:100000 IJ SOLN
INTRAMUSCULAR | Status: DC | PRN
  Administered 2017-08-17 (×2): 5 mL via INTRADERMAL

## 2017-08-17 MED ORDER — MISOPROSTOL 200 MCG PO TABS
200.0000 ug | ORAL_TABLET | Freq: Once | ORAL | Status: AC
  Administered 2017-08-17: 200 ug via BUCCAL

## 2017-08-17 MED ORDER — DIPHENHYDRAMINE HCL 25 MG PO CAPS
25.0000 mg | ORAL_CAPSULE | Freq: Four times a day (QID) | ORAL | Status: DC | PRN
  Administered 2017-08-17 – 2017-08-18 (×2): 25 mg via ORAL
  Filled 2017-08-17 (×2): qty 1

## 2017-08-17 MED ORDER — MORPHINE SULFATE (PF) 0.5 MG/ML IJ SOLN
INTRAMUSCULAR | Status: AC
  Filled 2017-08-17: qty 10

## 2017-08-17 MED ORDER — NALOXONE HCL 0.4 MG/ML IJ SOLN: 1.0000 ug/kg/h | INTRAVENOUS | Status: DC | PRN

## 2017-08-17 MED ORDER — NALOXONE HCL 0.4 MG/ML IJ SOLN: 0.4000 mg | INTRAMUSCULAR | Status: DC | PRN

## 2017-08-17 MED ORDER — DIPHENHYDRAMINE HCL 50 MG/ML IJ SOLN
INTRAMUSCULAR | Status: AC
  Filled 2017-08-17: qty 1

## 2017-08-17 MED ORDER — DIPHENOXYLATE-ATROPINE 2.5-0.025 MG PO TABS
2.0000 | ORAL_TABLET | Freq: Once | ORAL | Status: AC
  Administered 2017-08-17: 2 via ORAL
  Filled 2017-08-17: qty 2

## 2017-08-17 MED ORDER — OXYTOCIN 10 UNIT/ML IJ SOLN
INTRAMUSCULAR | Status: AC
  Filled 2017-08-17: qty 4

## 2017-08-17 MED ORDER — PHENYLEPHRINE 40 MCG/ML (10ML) SYRINGE FOR IV PUSH (FOR BLOOD PRESSURE SUPPORT)
PREFILLED_SYRINGE | INTRAVENOUS | Status: AC
  Filled 2017-08-17: qty 20

## 2017-08-17 MED ORDER — TETANUS-DIPHTH-ACELL PERTUSSIS 5-2.5-18.5 LF-MCG/0.5 IM SUSP: 0.5000 mL | Freq: Once | INTRAMUSCULAR | Status: DC

## 2017-08-17 MED ORDER — DEXAMETHASONE SODIUM PHOSPHATE 10 MG/ML IJ SOLN
INTRAMUSCULAR | Status: DC | PRN
  Administered 2017-08-17: 10 mg via INTRAVENOUS

## 2017-08-17 MED ORDER — ARTIFICIAL TEARS OPHTHALMIC OINT
TOPICAL_OINTMENT | OPHTHALMIC | Status: AC
  Filled 2017-08-17: qty 3.5

## 2017-08-17 MED ORDER — MORPHINE SULFATE (PF) 0.5 MG/ML IJ SOLN
INTRAMUSCULAR | Status: DC | PRN
  Administered 2017-08-17: 4 mg via EPIDURAL

## 2017-08-17 MED ORDER — SIMETHICONE 80 MG PO CHEW
80.0000 mg | CHEWABLE_TABLET | ORAL | Status: DC
  Administered 2017-08-18: 80 mg via ORAL
  Filled 2017-08-17 (×2): qty 1

## 2017-08-17 MED ORDER — MEPERIDINE HCL 25 MG/ML IJ SOLN
INTRAMUSCULAR | Status: DC | PRN
  Administered 2017-08-17 (×2): 12.5 mg via INTRAVENOUS

## 2017-08-17 MED ORDER — NALBUPHINE HCL 10 MG/ML IJ SOLN: 5.0000 mg | Freq: Once | INTRAMUSCULAR | Status: DC | PRN

## 2017-08-17 MED ORDER — MISOPROSTOL 200 MCG PO TABS
ORAL_TABLET | ORAL | Status: AC
  Filled 2017-08-17: qty 1

## 2017-08-17 MED ORDER — SIMETHICONE 80 MG PO CHEW
80.0000 mg | CHEWABLE_TABLET | ORAL | Status: DC | PRN
  Administered 2017-08-19: 80 mg via ORAL

## 2017-08-17 MED ORDER — ACETAMINOPHEN 325 MG PO TABS
650.0000 mg | ORAL_TABLET | ORAL | Status: DC | PRN
  Administered 2017-08-17 – 2017-08-19 (×3): 650 mg via ORAL
  Filled 2017-08-17 (×3): qty 2

## 2017-08-17 MED ORDER — IBUPROFEN 600 MG PO TABS
600.0000 mg | ORAL_TABLET | Freq: Four times a day (QID) | ORAL | Status: DC
  Administered 2017-08-18 – 2017-08-19 (×4): 600 mg via ORAL
  Filled 2017-08-17 (×8): qty 1

## 2017-08-17 MED ORDER — MISOPROSTOL 200 MCG PO TABS: 200.0000 ug | ORAL_TABLET | Freq: Once | ORAL | Status: DC

## 2017-08-17 MED ORDER — PROMETHAZINE HCL 25 MG/ML IJ SOLN: 6.2500 mg | INTRAMUSCULAR | Status: DC | PRN

## 2017-08-17 MED ORDER — LACTATED RINGERS IV SOLN
INTRAVENOUS | Status: DC
  Administered 2017-08-17: 15:00:00 via INTRAVENOUS

## 2017-08-17 MED ORDER — COCONUT OIL OIL: 1.0000 "application " | TOPICAL_OIL | Status: DC | PRN

## 2017-08-17 MED ORDER — MEPERIDINE HCL 25 MG/ML IJ SOLN: 6.2500 mg | INTRAMUSCULAR | Status: DC | PRN

## 2017-08-17 MED ORDER — ZOLPIDEM TARTRATE 5 MG PO TABS: 5.0000 mg | ORAL_TABLET | Freq: Every evening | ORAL | Status: DC | PRN

## 2017-08-17 MED ORDER — LACTATED RINGERS IV SOLN
INTRAVENOUS | Status: DC | PRN
  Administered 2017-08-17 (×2): via INTRAVENOUS

## 2017-08-17 MED ORDER — PRENATAL MULTIVITAMIN CH
1.0000 | ORAL_TABLET | Freq: Every day | ORAL | Status: DC
  Administered 2017-08-18 – 2017-08-19 (×2): 1 via ORAL
  Filled 2017-08-17 (×2): qty 1

## 2017-08-17 MED ORDER — PANTOPRAZOLE SODIUM 40 MG PO TBEC
40.0000 mg | DELAYED_RELEASE_TABLET | Freq: Every day | ORAL | Status: DC
  Administered 2017-08-18 – 2017-08-19 (×2): 40 mg via ORAL
  Filled 2017-08-17 (×2): qty 1

## 2017-08-17 MED ORDER — METHYLPREDNISOLONE SODIUM SUCC 125 MG IJ SOLR
80.0000 mg | Freq: Once | INTRAMUSCULAR | Status: AC
  Administered 2017-08-17: 80 mg via INTRAVENOUS
  Filled 2017-08-17: qty 1.28

## 2017-08-17 MED ORDER — CEFAZOLIN SODIUM-DEXTROSE 2-3 GM-%(50ML) IV SOLR
INTRAVENOUS | Status: DC | PRN
  Administered 2017-08-17: 2 g via INTRAVENOUS

## 2017-08-17 MED ORDER — ONDANSETRON HCL 4 MG/2ML IJ SOLN
INTRAMUSCULAR | Status: AC
  Filled 2017-08-17: qty 2

## 2017-08-17 MED ORDER — SIMETHICONE 80 MG PO CHEW
80.0000 mg | CHEWABLE_TABLET | Freq: Three times a day (TID) | ORAL | Status: DC
  Administered 2017-08-17 – 2017-08-19 (×4): 80 mg via ORAL
  Filled 2017-08-17 (×5): qty 1

## 2017-08-17 MED ORDER — METOCLOPRAMIDE HCL 5 MG/ML IJ SOLN
INTRAMUSCULAR | Status: AC
  Filled 2017-08-17: qty 2

## 2017-08-17 MED ORDER — CARBOPROST TROMETHAMINE 250 MCG/ML IM SOLN
INTRAMUSCULAR | Status: AC
  Filled 2017-08-17: qty 1

## 2017-08-17 MED ORDER — NALBUPHINE HCL 10 MG/ML IJ SOLN: 5.0000 mg | INTRAMUSCULAR | Status: DC | PRN

## 2017-08-17 MED ORDER — SCOPOLAMINE 1 MG/3DAYS TD PT72: 1.0000 | MEDICATED_PATCH | Freq: Once | TRANSDERMAL | Status: DC

## 2017-08-17 MED ORDER — DIPHENHYDRAMINE HCL 25 MG PO CAPS
25.0000 mg | ORAL_CAPSULE | ORAL | Status: DC | PRN
  Filled 2017-08-17: qty 1

## 2017-08-17 MED ORDER — DEXAMETHASONE SODIUM PHOSPHATE 10 MG/ML IJ SOLN
INTRAMUSCULAR | Status: AC
  Filled 2017-08-17: qty 1

## 2017-08-17 MED ORDER — LORATADINE 10 MG PO TABS
10.0000 mg | ORAL_TABLET | Freq: Every day | ORAL | Status: DC
  Administered 2017-08-17 – 2017-08-19 (×3): 10 mg via ORAL
  Filled 2017-08-17 (×3): qty 1

## 2017-08-17 MED ORDER — SODIUM CHLORIDE 0.9% FLUSH: 3.0000 mL | INTRAVENOUS | Status: DC | PRN

## 2017-08-17 MED ORDER — MENTHOL 3 MG MT LOZG: 1.0000 | LOZENGE | OROMUCOSAL | Status: DC | PRN

## 2017-08-17 MED ORDER — FAMOTIDINE IN NACL 20-0.9 MG/50ML-% IV SOLN
20.0000 mg | INTRAVENOUS | Status: AC
  Administered 2017-08-17: 20 mg via INTRAVENOUS
  Filled 2017-08-17: qty 50

## 2017-08-17 MED ORDER — KETOROLAC TROMETHAMINE 30 MG/ML IJ SOLN
INTRAMUSCULAR | Status: DC | PRN
  Administered 2017-08-17: 30 mg via INTRAVENOUS

## 2017-08-17 MED ORDER — DIBUCAINE 1 % RE OINT: 1.0000 "application " | TOPICAL_OINTMENT | RECTAL | Status: DC | PRN

## 2017-08-17 MED ORDER — HYDROMORPHONE HCL 1 MG/ML IJ SOLN: 0.2500 mg | INTRAMUSCULAR | Status: DC | PRN

## 2017-08-17 MED ORDER — OXYTOCIN 40 UNITS IN LACTATED RINGERS INFUSION - SIMPLE MED: 2.5000 [IU]/h | INTRAVENOUS | Status: AC

## 2017-08-17 MED ORDER — MEPERIDINE HCL 25 MG/ML IJ SOLN
INTRAMUSCULAR | Status: AC
Start: 2017-08-17 — End: 2017-08-17
  Filled 2017-08-17: qty 1

## 2017-08-17 MED ORDER — FERROUS SULFATE 325 (65 FE) MG PO TABS
325.0000 mg | ORAL_TABLET | Freq: Every day | ORAL | Status: DC
  Administered 2017-08-19: 325 mg via ORAL
  Filled 2017-08-17: qty 1

## 2017-08-17 MED ORDER — DIPHENHYDRAMINE HCL 50 MG/ML IJ SOLN
12.5000 mg | INTRAMUSCULAR | Status: DC | PRN
  Administered 2017-08-17: 12.5 mg via INTRAVENOUS

## 2017-08-17 MED ORDER — CHLOROPROCAINE HCL (PF) 3 % IJ SOLN
INTRAMUSCULAR | Status: AC
  Filled 2017-08-17: qty 20

## 2017-08-17 MED ORDER — PHENYLEPHRINE HCL 10 MG/ML IJ SOLN
INTRAMUSCULAR | Status: DC | PRN
  Administered 2017-08-17 (×5): 80 ug via INTRAVENOUS

## 2017-08-17 MED ORDER — OXYCODONE HCL 5 MG/5ML PO SOLN: 5.0000 mg | Freq: Once | ORAL | Status: DC | PRN

## 2017-08-17 MED ORDER — SODIUM CHLORIDE 0.9 % IJ SOLN
INTRAMUSCULAR | Status: AC
  Filled 2017-08-17: qty 20

## 2017-08-17 MED ORDER — ONDANSETRON HCL 4 MG/2ML IJ SOLN
4.0000 mg | Freq: Three times a day (TID) | INTRAMUSCULAR | Status: DC | PRN
  Administered 2017-08-17: 4 mg via INTRAVENOUS
  Filled 2017-08-17: qty 2

## 2017-08-17 MED ORDER — SENNOSIDES-DOCUSATE SODIUM 8.6-50 MG PO TABS
2.0000 | ORAL_TABLET | ORAL | Status: DC
  Administered 2017-08-17 – 2017-08-18 (×2): 2 via ORAL
  Filled 2017-08-17 (×2): qty 2

## 2017-08-17 MED ORDER — WITCH HAZEL-GLYCERIN EX PADS: 1.0000 "application " | MEDICATED_PAD | CUTANEOUS | Status: DC | PRN

## 2017-08-17 MED ORDER — DIPHENHYDRAMINE HCL 50 MG/ML IJ SOLN
12.5000 mg | INTRAMUSCULAR | Status: DC | PRN
  Administered 2017-08-17: 12.5 mg via INTRAVENOUS
  Filled 2017-08-17 (×3): qty 1

## 2017-08-17 MED ORDER — OXYTOCIN 40 UNITS IN LACTATED RINGERS INFUSION - SIMPLE MED
INTRAVENOUS | Status: AC
  Filled 2017-08-17: qty 1000

## 2017-08-17 MED ORDER — SCOPOLAMINE 1 MG/3DAYS TD PT72
MEDICATED_PATCH | TRANSDERMAL | Status: AC
  Filled 2017-08-17: qty 1

## 2017-08-17 MED ORDER — SCOPOLAMINE 1 MG/3DAYS TD PT72
MEDICATED_PATCH | TRANSDERMAL | Status: DC | PRN
  Administered 2017-08-17: 1 via TRANSDERMAL

## 2017-08-17 MED ORDER — KETOROLAC TROMETHAMINE 30 MG/ML IJ SOLN
INTRAMUSCULAR | Status: AC
  Filled 2017-08-17: qty 1

## 2017-08-17 MED ORDER — CEFAZOLIN SODIUM-DEXTROSE 2-3 GM-%(50ML) IV SOLR
INTRAVENOUS | Status: AC
  Filled 2017-08-17: qty 50

## 2017-08-17 MED ORDER — CARBOPROST TROMETHAMINE 250 MCG/ML IM SOLN
250.0000 ug | INTRAMUSCULAR | Status: DC | PRN
  Administered 2017-08-17: 250 ug via INTRAMUSCULAR

## 2017-08-17 MED ORDER — TRANEXAMIC ACID 1000 MG/10ML IV SOLN
1000.0000 mg | INTRAVENOUS | Status: AC
  Administered 2017-08-17: 1000 mg via INTRAVENOUS
  Filled 2017-08-17: qty 10

## 2017-08-17 MED ORDER — OXYCODONE HCL 5 MG PO TABS: 5.0000 mg | ORAL_TABLET | Freq: Once | ORAL | Status: DC | PRN

## 2017-08-17 MED ORDER — ONDANSETRON HCL 4 MG/2ML IJ SOLN
INTRAMUSCULAR | Status: DC | PRN
  Administered 2017-08-17: 4 mg via INTRAVENOUS

## 2017-08-17 MED ORDER — DIPHENHYDRAMINE HCL 50 MG/ML IJ SOLN
12.5000 mg | INTRAMUSCULAR | Status: AC
  Administered 2017-08-17: 12.5 mg via INTRAVENOUS

## 2017-08-17 MED ORDER — DIPHENHYDRAMINE HCL 50 MG/ML IJ SOLN
12.5000 mg | Freq: Once | INTRAMUSCULAR | Status: AC
  Administered 2017-08-17: 12.5 mg via INTRAVENOUS

## 2017-08-17 SURGICAL SUPPLY — 38 items
BENZOIN TINCTURE PRP APPL 2/3 (GAUZE/BANDAGES/DRESSINGS) ×3 IMPLANT
CHLORAPREP W/TINT 26ML (MISCELLANEOUS) ×3 IMPLANT
CLAMP CORD UMBIL (MISCELLANEOUS) IMPLANT
CLOSURE WOUND 1/2 X4 (GAUZE/BANDAGES/DRESSINGS) ×1
CLOTH BEACON ORANGE TIMEOUT ST (SAFETY) ×3 IMPLANT
DECANTER SPIKE VIAL GLASS SM (MISCELLANEOUS) ×6 IMPLANT
DRSG OPSITE POSTOP 4X10 (GAUZE/BANDAGES/DRESSINGS) ×3 IMPLANT
ELECT REM PT RETURN 9FT ADLT (ELECTROSURGICAL) ×3
ELECTRODE REM PT RTRN 9FT ADLT (ELECTROSURGICAL) ×1 IMPLANT
EXTRACTOR VACUUM KIWI (MISCELLANEOUS) IMPLANT
GLOVE BIO SURGEON ST LM GN SZ9 (GLOVE) ×3 IMPLANT
GLOVE BIOGEL PI IND STRL 7.0 (GLOVE) ×3 IMPLANT
GLOVE BIOGEL PI IND STRL 9 (GLOVE) ×1 IMPLANT
GLOVE BIOGEL PI INDICATOR 7.0 (GLOVE) ×6
GLOVE BIOGEL PI INDICATOR 9 (GLOVE) ×2
GOWN STRL REUS W/TWL 2XL LVL3 (GOWN DISPOSABLE) ×3 IMPLANT
GOWN STRL REUS W/TWL LRG LVL3 (GOWN DISPOSABLE) ×3 IMPLANT
NEEDLE HYPO 25X5/8 SAFETYGLIDE (NEEDLE) IMPLANT
NS IRRIG 1000ML POUR BTL (IV SOLUTION) ×3 IMPLANT
PACK C SECTION WH (CUSTOM PROCEDURE TRAY) ×3 IMPLANT
PAD OB MATERNITY 4.3X12.25 (PERSONAL CARE ITEMS) ×3 IMPLANT
PENCIL SMOKE EVAC W/HOLSTER (ELECTROSURGICAL) ×3 IMPLANT
RTRCTR C-SECT PINK 25CM LRG (MISCELLANEOUS) ×3 IMPLANT
RTRCTR C-SECT PINK 34CM XLRG (MISCELLANEOUS) IMPLANT
STRIP CLOSURE SKIN 1/2X4 (GAUZE/BANDAGES/DRESSINGS) ×2 IMPLANT
SUT MNCRL 0 VIOLET CTX 36 (SUTURE) ×2 IMPLANT
SUT MONOCRYL 0 CTX 36 (SUTURE) ×4
SUT PLAIN 2 0 (SUTURE) ×2
SUT PLAIN ABS 2-0 CT1 27XMFL (SUTURE) ×1 IMPLANT
SUT VIC AB 0 CT1 27 (SUTURE) ×2
SUT VIC AB 0 CT1 27XBRD ANBCTR (SUTURE) ×1 IMPLANT
SUT VIC AB 2-0 CT1 27 (SUTURE) ×4
SUT VIC AB 2-0 CT1 TAPERPNT 27 (SUTURE) ×2 IMPLANT
SUT VIC AB 4-0 KS 27 (SUTURE) ×3 IMPLANT
SYR BULB IRRIGATION 50ML (SYRINGE) IMPLANT
SYR CONTROL 10ML LL (SYRINGE) ×3 IMPLANT
TOWEL OR 17X24 6PK STRL BLUE (TOWEL DISPOSABLE) ×3 IMPLANT
TRAY FOLEY BAG SILVER LF 14FR (SET/KITS/TRAYS/PACK) ×3 IMPLANT

## 2017-08-17 NOTE — Progress Notes (Signed)
Rhonda Graves is a 36 y.o. G2P1001 at [redacted]w[redacted]d  admitted for induction of labor due to Hypertension ot currently on meds,and AMA.  Subjective: Pt has not progressed in 3 hours, and cervix is thickening.  Pt had a prolonged decel requiring d/c of pitocin at 1:30 with slow recovery.   Objective: BP 112/65   Pulse 97   Temp 98.5 F (36.9 C) (Oral)   Resp 18   Ht 5\' 1"  (1.549 m)   Wt 162 lb (73.5 kg)   LMP 11/16/2016 (Exact Date)   SpO2 99%   BMI 30.61 kg/m  No intake/output data recorded. No intake/output data recorded.  FHT:  145 with accels and full recovery from prior decel UC:   regular, every 4- minutes SVE:   Dilation: 6 Effacement (%): 80 Station: -1 Exam by:: K Shaw,CNM My exam is 6/40/-2 , no lower. Exam during a contraction shows NO descent of vertex. Labs: Lab Results  Component Value Date   WBC 9.5 08/16/2017   HGB 11.8 (L) 08/16/2017   HCT 34.9 (L) 08/16/2017   MCV 97.2 08/16/2017   PLT 225 08/16/2017    Assessment / Plan: Arrest in active phase of labor Prior cesarean at 6 cm for FIOL, now with repeat arrest of labor. Labor: arrest of labor at 6 cm. Preeclampsia:   Fetal Wellbeing:  Category I Pain Control:  Epidural I/D:  n/a Anticipated MOD:  Cesarean delivery. We had a nice discussion regarding risk, alternative of more labor, potential complications such as uterine atony, uterine scar rupture, bleeding , infection. Pt agrees to the recommended repeat cesarean for failure to progress, failed TOLAC.  Jonnie Kind 08/17/2017, 2:54 AM

## 2017-08-17 NOTE — Progress Notes (Signed)
Went to check on patient.   She still had some mile itching but no facial swelling and no respiratory complaints.  We discussed seriousness of allergic reactions/anaphylaxis and some of her medical history.   She claims diagnosis of walnut allergy but no diagnosis of coconut allergy (suggest by pharmacy as possible given coconut oil on floor).   She says she occasionally eats coconut deserts without complication so coconut involvement seems unlikely.  Patient and husband counseled about importance of immediately reporting any respiratory/throat/swallowing difficulties and they understand/agree.

## 2017-08-17 NOTE — Addendum Note (Signed)
Addendum  created 08/17/17 1054 by Lyndle Herrlich, MD   Order sets accessed

## 2017-08-17 NOTE — Progress Notes (Signed)
Pt complains of slight dizziness while sitting on the side of the bed for the first time. Orthostatic vital signs noted. Manya Silvas CNM notified. No new orders given. Will continue to monitor pt.

## 2017-08-17 NOTE — Transfer of Care (Signed)
Immediate Anesthesia Transfer of Care Note  Patient: Rhonda Graves  Procedure(s) Performed: CESAREAN SECTION (N/A Abdomen)  Patient Location: PACU  Anesthesia Type:Epidural  Level of Consciousness: awake, alert  and oriented  Airway & Oxygen Therapy: Patient Spontanous Breathing  Post-op Assessment: Report given to RN and Post -op Vital signs reviewed and stable  Post vital signs: Reviewed and stable  Last Vitals:  Vitals:   08/17/17 0200 08/17/17 0230  BP: 118/65 112/65  Pulse: 100 97  Resp: 18 18  Temp:  36.9 C  SpO2:      Last Pain:  Vitals:   08/17/17 0230  TempSrc: Oral  PainSc:       Patients Stated Pain Goal: 2 (19/75/88 3254)  Complications: No apparent anesthesia complications and recall present

## 2017-08-17 NOTE — Progress Notes (Signed)
Patient ID: Rhonda Graves, female   DOB: July 03, 1981, 36 y.o.   MRN: [id number]  Eval for progress: FHR 120s, +accels, some early variables Ctx q 3-4 min w/ Pit @ 12mu/min IUPC inserted without difficulty Cx unchanged (6/80/-1)  IUP@term  cHTN Protracted active phase, suppose due to inadequate ctx TOLAC  Titrate Pit to keep MVUs adequate and eval for progress   Serita Grammes CNM 08/17/2017

## 2017-08-17 NOTE — Anesthesia Postprocedure Evaluation (Signed)
Anesthesia Post Note  Patient: Rhonda Graves  Procedure(s) Performed: CESAREAN SECTION (N/A Abdomen)     Patient location during evaluation: Mother Baby Anesthesia Type: Epidural Level of consciousness: awake and alert Pain management: pain level controlled Vital Signs Assessment: post-procedure vital signs reviewed and stable Respiratory status: spontaneous breathing, nonlabored ventilation and respiratory function stable Cardiovascular status: stable Postop Assessment: no headache, no backache and epidural receding Anesthetic complications: no    Last Vitals:  Vitals:   08/17/17 0545 08/17/17 0600  BP: 121/77 (!) 145/47  Pulse: 85 94  Resp: 16 14  Temp:    SpO2: 94% 94%    Last Pain:  Vitals:   08/17/17 0600  TempSrc:   PainSc: 0-No pain   Pain Goal: Patients Stated Pain Goal: 2 (08/16/17 2315)               Lynda [place]

## 2017-08-17 NOTE — Addendum Note (Signed)
Addendum  created 08/17/17 1028 by Lyndle Herrlich, MD   Order sets accessed

## 2017-08-17 NOTE — Progress Notes (Signed)
Patient ID: Rhonda Graves, female   DOB: [birth year], 36 y.o.   MRN: [id number] S: CNM CTBS for pt reporting possible allergic reaction. Facial itching started upon arrival to room a few hours ago. Now have mild swelling of lips and worsening facial itching. Upon questioning pt reports mild tightness of throat and feeling like saliva if a little harder to swallow. No SOB, wheezing, N/V.   Pt has significant food and environmental allergies, but no known medication allergies and no know exposure to know allergens.   Received .5 mg Benadryl ~30 minutes prior to CNM arrival. No improvement.   O: BP 110/67 (BP Location: Left Arm)   Pulse 71   Temp 99.4 F (37.4 C) (Oral)   Resp 18   Ht 5\' 1"  (1.549 m)   Wt 162 lb (73.5 kg)   LMP 11/16/2016 (Exact Date)   SpO2 95%   Breastfeeding? Unknown   BMI 30.61 kg/m   General: Well-appearing female in mild distress from itching.  Head: Face flushed, ?slight swelling across cheeks and upper lip.  Lungs: CTAB. No wheezing.  Heart: RRR Skin: No hives of rash on rest of body.   A: Allergic reaction to unknown substance. No sign of anaphylaxis at this time.   P: Will give 25 mg Benadryl, Solu-Medrol 80 mg and Pepcid 20 mg IV for itching.  Will give Epi if there are any airway concerns. Dr. Elly Modena made aware. Agrees w/ POC.   [name], [place], [place] 08/17/2017 12:03 PM

## 2017-08-17 NOTE — Progress Notes (Signed)
23 Dr Glennon Mac called and given report regarding previous lab results, bleeding. OK to discharge if hemoglobin 9 or greater and can remove epidural at any time. Lab called to come now.

## 2017-08-17 NOTE — Progress Notes (Signed)
Patient ID: Rhonda Graves, female   DOB: 06-10-1981, 36 y.o.   MRN: [id number]  Began leaking fluid @ 2200; pain increased w/ ctx and now w/ epidural and mostly comfortable  VSS, afebrile FHR 120s, +accels, early variables w/ ctx Ctx q 4-6 mins w/ Pit @ 42mu/min (had to be turned down after SROM due to increased ctx) Cx 6/80/-1 per RN exam  IUP@term  cHTN TOLAC Early active labor/SROM  Will reexamine cx at 0200 for change  Serita Grammes CNM 08/17/2017

## 2017-08-17 NOTE — Lactation Note (Signed)
This note was copied from a baby's chart. Lactation Consultation Note  Patient Name: Girl Rashada Klontz [id number] Date: 08/17/2017 Reason for consult: Initial assessment;Term  Visited with P2 Mom who delivered by C/S after failed VBAC.  PPH, Hgb 10, EBL 1100 ml.   Baby has been to breast 5 times, and baby is 29 hrs old.  Mom is worried about not having much milk yet.  She supplemented her first child, but wants to exclusively breast feed this baby.  Demonstrated hand expression, and colostrum easily expressed.  Baby sleeping in crib, but started making some noises, offered to assist with a latch.  Mom very sleepy and wanting to sleep, but agreed. Baby placed STS on Mom's chest.  After hand expressing, baby opened widely and latched to areola, but only sucked for a minute.  She did this 3 times.  Mom asked if she could sleep as baby was falling asleep as well. Swaddled baby and GMOB to hold baby.  Mom advised to ask for help at next feeding, when baby starts cueing.  After her nap, to put baby back STS on her chest.  Reassured Mom that she has exactly what baby needs.  Mom thinks her breasts are too big.  Explained the importance of latching baby deeply onto breast.  Mom needing guidance on hand support. Lactation brochure left with Mom.  Explained about lactation support available to her, and encouraged her to call prn.  Consult Status Consult Status: Follow-up Date: 08/18/17 Follow-up type: In-patient    Broadus John 08/17/2017, 4:58 PM

## 2017-08-17 NOTE — Op Note (Signed)
Please see the brief operative note for surgical details 

## 2017-08-17 NOTE — Brief Op Note (Signed)
08/17/2017  4:37 AM  PATIENT:  Rhonda Graves  36 y.o. female  PRE-OPERATIVE DIAGNOSIS: Pregnancy 39 weeks, chronic hypertension, status post failed induction of labor prior cesarean section requesting trial of labor, Repeat low transverse cesarean section for failure to progress   POST-OPERATIVE DIAGNOSIS: Same as above, delivered  repeat cesarean section failure to progress   PROCEDURE:  Procedure(s): CESAREAN SECTION (N/A) repeat low transverse  SURGEON:  Surgeon(s) and Role:    Jonnie Kind, MD - Primary  PHYSICIAN ASSISTANT:   ASSISTANTS: none   ANESTHESIA:   epidural  EBL:  1171 mL   BLOOD ADMINISTERED:none note patient is Jehovah's Witness  DRAINS: Urinary Catheter (Foley)   LOCAL MEDICATIONS USED:  NONE  SPECIMEN:  Source of Specimen:  Placental to labor and delivery  DISPOSITION OF SPECIMEN:  N/A  COUNTS:  YES  TOURNIQUET:  * No tourniquets in log *  DICTATION: .Dragon Dictation  PLAN OF CARE: Patient has active admission orders  PATIENT DISPOSITION:  PACU - hemodynamically stable.   Delay start of Pharmacological VTE agent (>24hrs) due to surgical blood loss or risk of bleeding: not applicable Indications: 36 year old multiparous female admitted for trial of labor at 39 weeks. Induction indication chronic hypertension not currently requiring blood pressure medications. The patient progressed to 6 cm dilated after Foley bulb cervical ripening and Pitocin but had arrest of labor at 6 cm 40% effaced -2 station with cervix never really well applied to the vertex. Details of procedure patient was taken to the operating room epidural analgesia confirmed. Timeout conducted, Ancef administered 2 g and transverse incision repeated, revising the old transverse skin incision which was asymmetric. Sharp dissection to the fascia which was opened transversely and then dissected easily off of the muscles with a very avascular space noted. Small bleeding vessel in the  midline which was coagulated. Peritoneal cavity was entered without difficulty. The bladder flap was noted to be very high on the lower uterine segment. Alexis retractor was positioned in the abdominal cavity opening. Bladder flap was developed. Transverse uterine incision made in the somewhat floppy lower uterine segment and was noted to be at the level of the fetal neck. A loop of cord bulge through the incision. The fetal vertex was rotated into the incision and delivered with some difficulty and required Kiwi vacuum to assist with getting the baby out after several minutes of trying to push the baby out without assistance the [facility] allowed for easy guidance of the vertex with fundal pressure applied. Patient was passed to the waiting pediatricians with good Apgars obtained and been clamped at 1 minute. The Pitocin was administered, the uterus had good tone and expel the placenta intact. Cord blood samples were obtained. The uterus was irrigated, confirmed as cleaned out effectively, with closure of the uterine incision being first layer running locking 0 Monocryl suture followed by an overlapping continuous running 0 Monocryl. Stasis was satisfactory. Abdomen was irrigated. The anterior peritoneum was closed using running 2-0 Vicryl. That midline bleeding vessel was again a problem with oozing and was coagulated carefully while holding the surgeon's fingers beneath the peritoneum. There was no rest of the bowel during this portion of the procedure. Midline was closed with running 2-0 Vicryl, the fascia then reapproximated with continuous running 0 Vicryl and subcutaneous tissues irrigated, confirmed as hemostatic, and reapproximated with 3 horizontal mattress sutures of 2-0 Vicryl followed by subcuticular 4-0 Vicryl closure of the skin incision with good cosmetic result and Steri-Strips with benzoin and  honeycomb dressing then applied. Sponge and needle counts were correct EBL at 1174 cc though the blood loss  during the procedure do not seem high.

## 2017-08-18 ENCOUNTER — Other Ambulatory Visit: Payer: Self-pay

## 2017-08-18 ENCOUNTER — Encounter (HOSPITAL_COMMUNITY): Payer: Self-pay

## 2017-08-18 LAB — CBC WITH DIFFERENTIAL/PLATELET
Basophils Absolute: 0 K/uL (ref 0.0–0.1)
Basophils Relative: 0 %
Eosinophils Absolute: 0.1 K/uL (ref 0.0–0.7)
Eosinophils Relative: 0 %
HCT: 28.5 % — ABNORMAL LOW (ref 36.0–46.0)
Hemoglobin: 9.5 g/dL — ABNORMAL LOW (ref 12.0–15.0)
Lymphocytes Relative: 16 %
Lymphs Abs: 3.2 K/uL (ref 0.7–4.0)
MCH: 32.9 pg (ref 26.0–34.0)
MCHC: 33.3 g/dL (ref 30.0–36.0)
MCV: 98.6 fL (ref 78.0–100.0)
Monocytes Absolute: 1.2 K/uL — ABNORMAL HIGH (ref 0.1–1.0)
Monocytes Relative: 6 %
Neutro Abs: 14.9 K/uL — ABNORMAL HIGH (ref 1.7–7.7)
Neutrophils Relative %: 78 %
Platelets: 226 K/uL (ref 150–400)
RBC: 2.89 MIL/uL — ABNORMAL LOW (ref 3.87–5.11)
RDW: 13.3 % (ref 11.5–15.5)
WBC: 19.3 K/uL — ABNORMAL HIGH (ref 4.0–10.5)

## 2017-08-18 MED ORDER — PREDNISONE 50 MG PO TABS
60.0000 mg | ORAL_TABLET | Freq: Once | ORAL | Status: AC
  Administered 2017-08-18: 12:00:00 60 mg via ORAL
  Filled 2017-08-18: qty 1

## 2017-08-18 MED ORDER — OXYTOCIN 10 UNIT/ML IJ SOLN
INTRAVENOUS | Status: DC | PRN
  Administered 2017-08-17: 40 [IU] via INTRAVENOUS

## 2017-08-18 NOTE — Addendum Note (Signed)
Addendum  created 08/18/17 0729 by Alvy Bimler, CRNA   Intraprocedure Meds edited, Sign clinical note

## 2017-08-18 NOTE — Progress Notes (Signed)
POSTPARTUM PROGRESS NOTE  Post Partum Day 1 Subjective:  Rhonda Graves is a 36 y.o. [id number] [redacted]w[redacted]d s/p LTCS for failed IOL.  Facial swelling has resolved but patient still has itching and a new sensation like sunburn diffusely.  Pt denies problems with ambulating, voiding or po intake.  She denies nausea or vomiting.  Pain is well controlled.  She has had flatus. She has not had bowel movement.  Lochia Minimal.   Objective: Blood pressure 125/74, pulse (!) 57, temperature 98.4 F (36.9 C), temperature source Oral, resp. rate 16, height 5\' 1"  (1.549 m), weight 162 lb (73.5 kg), last menstrual period 11/16/2016, SpO2 99 %, unknown if currently breastfeeding.  Physical Exam:  General: alert, cooperative and no distress Lochia:normal flow Chest: no respiratory distress Heart:regular rate, distal pulses intact Abdomen: soft, nontender,  Skin exam: no blistering, redness, erythema, or lesions of any kind.Marland Kitchendespite sensation of sun burn diffusely Uterine Fundus: firm, appropriately tender DVT Evaluation: No calf swelling or tenderness Extremities: minimal bilateral edema  Recent Labs    08/17/17 0833 08/18/17 0520  HGB 10.5* 9.5*  HCT 31.0* 28.5*    Assessment/Plan:  ASSESSMENT: Rhonda Graves is a 36 y.o. [id number] [redacted]w[redacted]d s/p LTCS after failed IOL 60mg  prednisone and continue benadryl for itching, medlist was reviewed for pharm and no specific concerns identified.   Medication from this surgery were added to possible med complication list  Plan for discharge tomorrow   LOS: 2 days   Overton Boggus BlandDO 08/18/2017, 9:15 AM

## 2017-08-18 NOTE — Anesthesia Postprocedure Evaluation (Signed)
Anesthesia Post Note  Patient: Rhonda Graves  Procedure(s) Performed: CESAREAN SECTION (N/A Abdomen)     Patient location during evaluation: Mother Baby Anesthesia Type: Epidural Level of consciousness: awake and alert Pain management: pain level controlled Vital Signs Assessment: post-procedure vital signs reviewed and stable Respiratory status: spontaneous breathing, nonlabored ventilation and respiratory function stable Cardiovascular status: stable Postop Assessment: no headache, no backache and epidural receding Anesthetic complications: no    Last Vitals:  Vitals:   08/18/17 0130 08/18/17 0626  BP: 118/68 125/74  Pulse: (!) 57   Resp: 16 16  Temp: 37.1 C 36.9 C  SpO2:      Last Pain:  Vitals:   08/18/17 0626  TempSrc: Oral  PainSc:    Pain Goal: Patients Stated Pain Goal: 2 (08/17/17 1705)               Gilmer Mor

## 2017-08-18 NOTE — Lactation Note (Signed)
This note was copied from a baby's chart. Lactation Consultation Note  Patient Name: Rhonda Graves [id number]'G Date: 08/18/2017 Reason for consult: Follow-up assessment Baby on breast well in cradle hold.  Mom states she wanted to use a nipple shield on right due to discomfort.  Nipples intact but mom c/o of pain.  Comfort gels given with instructions. Mom has many basic questions.  Teaching done and questions answered.  Recommended attempting latch without shield.  Mom very tired and baby cluster feeding.  Reassured.  Maternal Data    Feeding Feeding Type: Breast Fed Length of feed: 20 min  LATCH Score Latch: Grasps breast easily, tongue down, lips flanged, rhythmical sucking.  Audible Swallowing: Spontaneous and intermittent  Type of Nipple: Everted at rest and after stimulation  Comfort (Breast/Nipple): Soft / non-tender  Hold (Positioning): Assistance needed to correctly position infant at breast and maintain latch.  LATCH Score: 9  Interventions Interventions: Breast feeding basics reviewed  Lactation Tools Discussed/Used     Consult Status Consult Status: Follow-up Date: 08/19/17 Follow-up type: In-patient    Ave Filter 08/18/2017, 12:11 PM

## 2017-08-19 MED ORDER — OXYCODONE-ACETAMINOPHEN 5-325 MG PO TABS: 1.0000 | ORAL_TABLET | Freq: Four times a day (QID) | ORAL | 0 refills | Status: DC | PRN

## 2017-08-19 MED ORDER — OXYCODONE HCL 5 MG PO TABS
5.0000 mg | ORAL_TABLET | ORAL | Status: DC | PRN
  Administered 2017-08-19 (×2): 5 mg via ORAL
  Filled 2017-08-19 (×2): qty 1

## 2017-08-19 NOTE — Lactation Note (Signed)
This note was copied from a baby's chart. Lactation Consultation Note  Patient Name: Rhonda Graves [id number] Date: 08/19/2017 Reason for consult: Follow-up assessment;Difficult latch;Other (Comment);Infant weight loss(PPH 1700 EBL, NS use. )  Baby 81 hours old. Mom reports feeding remainder of EBM to baby and baby tolerated well. Discussed infant behavior at breast and enc to continue putting to breast with cues, then supplementing--increasing the amount with next feeding. Mom given [facility] loaner and enc to call for [facility] outpatient appointment if mom continues to have sore nipples or any issues with latch. Enc mom to call as needed as well.   Maternal Data    Feeding Feeding Type: Breast Milk Length of feed: 5 min  LATCH Score Latch: Too sleepy or reluctant, no latch achieved, no sucking elicited.  Audible Swallowing: A few with stimulation  Type of Nipple: Everted at rest and after stimulation  Comfort (Breast/Nipple): Filling, red/small blisters or bruises, mild/mod discomfort  Hold (Positioning): Assistance needed to correctly position infant at breast and maintain latch.  LATCH Score: 6  Interventions Interventions: Breast feeding basics reviewed;Assisted with latch;Skin to skin;Hand express;Breast compression;Adjust position;Support pillows;Expressed milk;DEBP  Lactation Tools Discussed/Used WIC Program: Yes Pump Review: Setup, frequency, and cleaning;Milk Storage Initiated by:: JW Date initiated:: 08/19/17   Consult Status Consult Status: PRN    Andres Labrum 08/19/2017, 3:45 PM

## 2017-08-19 NOTE — Progress Notes (Signed)
POSTPARTUM PROGRESS NOTE  Post Partum Day 2 Subjective:  Rhonda Graves is a 36 y.o. [id number] [redacted]w[redacted]d s/p LTCS for failed IOL for CHTN. Mostly resolved pruritus and sunburn-like feeling.   Patientt denies problems with ambulating, voiding or po intake.  She denies nausea or vomiting.  Pain is well controlled.  She has had flatus. She has not had bowel movement.  Lochia Minimal.   Objective: Blood pressure (!) 104/52, pulse 69, temperature 98.7 F (37.1 C), temperature source Oral, resp. rate 16, height 5\' 1"  (1.549 m), weight 162 lb (73.5 kg), last menstrual period 11/16/2016, SpO2 97 %, unknown if currently breastfeeding.  Physical Exam:  General: alert, cooperative and no distress Lochia:normal flow Abdomen: soft, nontender,  Uterine Fundus: firm, appropriately tender DVT Evaluation: No calf swelling or tenderness Extremities: minimal bilateral edema  Recent Labs    08/17/17 0833 08/18/17 0520  HGB 10.5* 9.5*  HCT 31.0* 28.5*    Assessment/Plan:  ASSESSMENT: Rhonda Graves is a 36 y.o. [id number] POD#2 s/p LTCS at [redacted]w[redacted]d  after failed IOL for CHTN. Stable BP. Breastfeeding, considering NFP for contraception. No other concerns Plan for discharge later today or tomorrow   LOS: 3 days   Verita Schneiders , MD 08/19/2017, 7:33 AM

## 2017-08-19 NOTE — Discharge Instructions (Signed)

## 2017-08-19 NOTE — Lactation Note (Signed)
This note was copied from a baby's chart. Lactation Consultation Note  Patient Name: Rhonda Graves JJHER'D Date: 08/19/2017 Reason for consult: Follow-up assessment;Difficult latch;Other (Comment);Infant weight loss(PPH 1700 EBL, NS use. )  Baby 50 hours old. Jana Half, RN called this LC to patient's room to assess the feeding. Ben-Davies, MD in the room, assessing baby and encouraging mom to make a follow-up appointment for baby with [facility] for the next day. Mom able to latch baby to left breast in football position and baby began BF with a deep latch, rhythmic suckles and a few swallows. However, baby sleepy at the breast and mom has bruised and scabbed areas on nipples, more pronounced on right breast, with bruising also on right breast--both upper-outer aspects (10- and 2-o'clock). Mom reports that she was initially using a NS, but has not needed the shield today.   Set mom up with DEBP and mom's milk flowing well. This LC changed a small stool diaper and baby noted to have a dry/hoarse cry. Baby given first 10 ml of EBM with curve-tipped syringe and this LC's gloved finger--and baby tolerated well. Reviewed how to supplement with syringe and finger with mom. Discussed using a bottle for larger amounts if necessary. Mom able to collect an additional 7 ml of EBM during remainder of 15-minute pumping session. As baby being burped and settled into crib, this LC noted that baby's fontanel appeared sunken--which was shown to [place], Therapist, sports and MOB. Discussed the need to continue to supplement baby until baby able to transfer breast milk better at breast as evidenced by mom's breast softening, baby swallowing at breast, baby satiated after feeding and not wanting to take supplemental milk as well as baby's output/diapers increasing and baby's weights within normal limits.  Plan is for mom to keep putting the baby to breast with cues and at least by 3 hours, then supplement with EBM/formula  according to guidelines--which were given with review. Stressed to mom that if her EBM volumes do not keep up with volumes needed for baby, then mom will need to supplement with formula. Discussed how to know to increase amounts of supplementation as well. Enc mom to continue to post-pump followed by hand expression. Discussed how to know when baby able to transfer sufficient volumes at the breast. Enc mom to drink plenty of water--to thirst, and discussed eating good/lean protein sources as well.   Mom has paperwork for [place], and will call for pump after she has fed baby and pumped one more time. Mom aware of OP/BFSG and [place] phone line assistance after D/C.    Maternal Data    Feeding Feeding Type: Breast Fed Length of feed: 15 min  LATCH Score Latch: Repeated attempts needed to sustain latch, nipple held in mouth throughout feeding, stimulation needed to elicit sucking reflex.  Audible Swallowing: A few with stimulation  Type of Nipple: Everted at rest and after stimulation  Comfort (Breast/Nipple): Filling, red/small blisters or bruises, mild/mod discomfort  Hold (Positioning): Assistance needed to correctly position infant at breast and maintain latch.  LATCH Score: 6  Interventions Interventions: Breast feeding basics reviewed;Assisted with latch;Skin to skin;Hand express;Breast compression;Adjust position;Support pillows;Expressed milk;DEBP  Lactation Tools Discussed/Used WIC Program: Yes Pump Review: Setup, frequency, and cleaning;Milk Storage Initiated by:: JW Date initiated:: 08/19/17   Consult Status Consult Status: PRN    Andres Labrum 08/19/2017, 2:31 PM

## 2017-08-19 NOTE — Discharge Summary (Signed)
OB Discharge Summary     Patient Name: Rhonda Graves DOB: 01-25-1981 MRN: [id number]  Date of admission: 08/16/2017 Delivering MD: Jonnie Kind   Date of discharge: 08/19/2017  Admitting diagnosis: 39 WK INDUCTION Intrauterine pregnancy: [redacted]w[redacted]d     Secondary diagnosis:  Active Problems:   Supervision of high risk pregnancy, antepartum   AMA (advanced maternal age) multigravida 35+, second trimester   History of cesarean delivery   Chronic hypertension   Status post repeat low transverse cesarean section  Additional problems:  -Hx of chronic HTN ( on procardia initally until 13 wk>ASA until 35 wk) -Hx of cesarean delivery -Polyhydramnios resolved    Discharge diagnosis: Term Pregnancy Delivered and CHTN                                                                                               Post partum procedures:N/A  Augmentation: Pitocin and Cytotec  Complications: None  Hospital course:  Onset of Labor With Unplanned C/S  36 y.o. yo [id number] at [redacted]w[redacted]d was admitted in Latent Labor on 08/16/2017. Patient had a labor course significant for failed IOL. Membrane Rupture Time/Date: 10:00 PM ,08/16/2017   The patient went for cesarean section due to Arrest of Descent, and delivered a Viable infant,08/17/2017  Details of operation can be found in separate operative note. Patient had an uncomplicated postpartum course.  She is ambulating,tolerating a regular diet, passing flatus, and urinating well.  Patient is discharged home in stable condition 08/19/17.  Patient had swelling of face, itching, and sensation of sunburn diffusely after csection.  We evaluated different medications from this admission vs last and they have been added as potential complications to her epic chart. She resolved with steroids/benadryl over 2 days and was d/c'd to home with pcp follow-up. Stable at discharge. Ambulating and tolerating PO normally. BPs normal PP.   Physical exam  Vitals:   08/18/17  0130 08/18/17 0626 08/18/17 1746 08/19/17 0603  BP: 118/68 125/74 (!) 112/58 (!) 104/52  Pulse: (!) 57  65 69  Resp: 16 16 16 16   Temp: 98.7 F (37.1 C) 98.4 F (36.9 C) 98.2 F (36.8 C) 98.7 F (37.1 C)  TempSrc: Oral Oral Oral Oral  SpO2:   97%   Weight:      Height:       General: alert, cooperative and no distress Lochia: appropriate Uterine Fundus: firm Incision: Healing well with no significant drainage DVT Evaluation: No evidence of DVT seen on physical exam. Labs: Lab Results  Component Value Date   WBC 19.3 (H) 08/18/2017   HGB 9.5 (L) 08/18/2017   HCT 28.5 (L) 08/18/2017   MCV 98.6 08/18/2017   PLT 226 08/18/2017   CMP Latest Ref Rng & Units 08/16/2017  Glucose 65 - 99 mg/dL 90  BUN 6 - 20 mg/dL 13  Creatinine 0.44 - 1.00 mg/dL 0.73  Sodium 135 - 145 mmol/L 135  Potassium 3.5 - 5.1 mmol/L 3.7  Chloride 101 - 111 mmol/L 106  CO2 22 - 32 mmol/L 20(L)  Calcium 8.9 - 10.3 mg/dL 9.4  Total Protein 6.5 - 8.1  g/dL 6.4(L)  Total Bilirubin 0.3 - 1.2 mg/dL 0.6  Alkaline Phos 38 - 126 U/L 159(H)  AST 15 - 41 U/L 23  ALT 14 - 54 U/L 14    Discharge instruction: per After Visit Summary and "Baby and Me Booklet".  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 Uncertain what caused reaction.   Lomotil [diphenoxylate] Itching, Swelling   Had allergic reaction after Lomotil, Lysteda and Hemabate. Uncertain what caused reaction.   Lysteda [tranexamic Acid] Itching, Swelling   Had allergic reaction after Lomotil, Lysteda and Hemabate. Uncertain what caused reaction.      Medication List    TAKE these medications   acetaminophen 325 MG tablet Commonly known as:  TYLENOL Take 2 tablets (650 mg total) by mouth every 6 (six) hours  as needed.   aspirin 81 MG chewable tablet Chew 81 mg by mouth daily.   Cetirizine HCl 10 MG Caps Commonly known as:  ZYRTEC ALLERGY Take 1 capsule (10 mg total) by mouth daily.   EPIPEN 2-PAK 0.3 mg/0.3 mL Soaj injection Generic drug:  EPINEPHrine Inject 1 Dose into the muscle as needed for anaphylaxis.   esomeprazole 20 MG capsule Commonly known as:  NEXIUM Take 1 capsule (20 mg total) by mouth daily at 12 noon.   IRON 27 PO Take 1 tablet by mouth daily.   magic mouthwash Soln Take 5 mLs by mouth 4 (four) times daily as needed for mouth pain.   multivitamin-prenatal 27-0.8 MG Tabs tablet Take 1 tablet by mouth daily at 12 noon.   oxyCODONE-acetaminophen 5-325 MG tablet Commonly known as:  ROXICET Take 1 tablet by mouth every 6 (six) hours as needed.   valACYclovir 1000 MG tablet Commonly known as:  VALTREX Take 1 tablet (1,000 mg total) by mouth 2 (two) times daily.   zolpidem 5 MG tablet Commonly known as:  AMBIEN Take 1 tablet (5 mg total) by mouth at bedtime as needed for sleep.       Diet: routine diet  Activity: Advance as tolerated. Pelvic rest for 6 weeks.   Outpatient follow up:6 weeks Follow up Appt:No future appointments. Follow up Visit: Appt reminder sent to clinic to schedule PP visit  Postpartum contraception: Undecided  Newborn Data: Live born female  Birth Weight: 6 lb 15.5 oz (3161 g) APGAR: 78, 10  Newborn Delivery   Birth date/time:  08/17/2017 03:45:00 Delivery type:  C-Section, Vacuum Assisted C-section categorization:  Repeat     Baby Feeding: Breast Disposition:home with mother   08/19/2017 Sherene Sires, DO PGY-1  OB [place]  I have seen and examined this patient. I agree with above documentation and have made edits as needed.   Luiz Blare, DO OB Fellow

## 2017-08-20 DIAGNOSIS — Z98891 History of uterine scar from previous surgery: Secondary | ICD-10-CM

## 2017-08-20 HISTORY — DX: History of uterine scar from previous surgery: Z98.891

## 2017-08-22 ENCOUNTER — Telehealth: Payer: Self-pay | Admitting: Clinical

## 2017-08-22 NOTE — Telephone Encounter (Signed)
Left HIPPA-compliant message to call Roselyn Reef at [facility] for [facility] at [phone].

## 2017-08-25 ENCOUNTER — Ambulatory Visit (INDEPENDENT_AMBULATORY_CARE_PROVIDER_SITE_OTHER): Payer: Medicaid Other | Admitting: Clinical

## 2017-08-25 DIAGNOSIS — F430 Acute stress reaction: Secondary | ICD-10-CM

## 2017-08-25 NOTE — BH Specialist Note (Signed)
Integrated Behavioral Health Initial Visit  MRN: [id number] Name: Rhonda Graves  Number of [place] Clinician visits:: 1/6 Session Start time: 11:15  Session End time: 12:15 Total time: 1 hour  Type of Service: [place] Interpretor:No. Interpretor Name and Language: n/a   Warm Hand Off Completed.       SUBJECTIVE: Rhonda Graves is a 36 y.o. female accompanied by n/a Patient was referred by self for traumatic birth experience. Patient reports the following symptoms/concerns: Pt states her primary concern today is a need to talk about her traumatic birth experience, and the effect it is having on her daily functioning, including anxiety, panic, depression, fatigue, feeling "like a failure", and feeling unheard and disregarded during labor. Pt feels she may need medication to cope. Duration of problem: Over one week; Severity of problem: severe  OBJECTIVE: Mood: Anxious and Depressed and Affect: Depressed and Tearful Risk of harm to self or others: No plan to harm self or others  LIFE CONTEXT: Family and Social: Pt lives with husband, 43yo and newborn daughters; her mother and father are visiting from [place] to help postpartum until end 2018 School/Work: On maternity leave(working for one hour/time, as needed) Self-Care: No substances; sleeping when baby sleeps Life Changes: Recent childbirth  GOALS ADDRESSED: Patient will: 1. Reduce symptoms of: anxiety and depression 2. Increase knowledge and/or ability of: coping skills  3. Demonstrate ability to: Increase healthy adjustment to current life circumstances  INTERVENTIONS: Interventions utilized: Solution-Focused Strategies and Psychoeducation and/or Health Education  Standardized Assessments completed: GAD-7 and PHQ 9  ASSESSMENT: Patient currently experiencing Acute stress reaction, following traumatic childbirth experience.   Patient may benefit from psychoeducation  and brief therapeutic intervention regarding coping with symptoms of anxiety and depression related to recent traumatic experience.  PLAN: 1. Follow up with behavioral health clinician on : Two weeks; phone f/u earlier, as discussed with patient 2. Behavioral recommendations:  -Continue allowing parents to help as much as they would like -Consider writing down birth experience expectations and reality -Consider apps as additional self-coping strategies -Read educational material regarding coping with symptoms of anxiety with panic and depression 3. Referral(s): [place] (In Clinic) 4. "From scale of 1-10, how likely are you to follow plan?": 8  Garlan Fair, LCSW   Depression screen [facility] 2/9 08/25/2017 07/23/2017 07/16/2017 07/09/2017 04/17/2017  Decreased Interest 0 0 0 1 0  Down, Depressed, Hopeless 1 1 0 1 1  PHQ - 2 Score 1 1 0 2 1  Altered sleeping 1 1 1 1  0  Tired, decreased energy 2 2 2 1 2   Change in appetite 0 0 0 0 0  Feeling bad or failure about yourself  2 0 0 - 0  Trouble concentrating 0 0 0 1 0  Moving slowly or fidgety/restless 0 0 0 0 0  Suicidal thoughts 0 0 0 0 0  PHQ-9 Score 6 4 3 5 3   Difficult doing work/chores Somewhat difficult - - - -  Some recent data might be hidden   GAD 7 : Generalized Anxiety Score 08/25/2017 07/23/2017 07/16/2017 07/09/2017  Nervous, Anxious, on Edge 1 1 0 1  Control/stop worrying 1 1 0 1  Worry too much - different things 1 1 0 1  Trouble relaxing 1 1 0 1  Restless 0 0 0 0  Easily annoyed or irritable 0 1 0 2  Afraid - awful might happen 1 1 0 0  Total GAD 7 Score 5 6 0  6  Anxiety Difficulty Very difficult - - -

## 2017-08-26 ENCOUNTER — Other Ambulatory Visit: Payer: Self-pay | Admitting: Advanced Practice Midwife

## 2017-08-26 DIAGNOSIS — O99345 Other mental disorders complicating the puerperium: Principal | ICD-10-CM

## 2017-08-26 DIAGNOSIS — F53 Postpartum depression: Secondary | ICD-10-CM

## 2017-08-26 MED ORDER — SERTRALINE HCL 50 MG PO TABS: 50.0000 mg | ORAL_TABLET | Freq: Every day | ORAL | 2 refills | Status: DC

## 2017-08-26 NOTE — Progress Notes (Signed)
Rx Zoloft per pt request for Hx depression. Needs Med check w/ Roselyn Reef.

## 2017-08-29 ENCOUNTER — Telehealth: Payer: Self-pay | Admitting: Clinical

## 2017-08-29 NOTE — Telephone Encounter (Signed)
[facility] Medication Management Phone Note  MRN: [id number] NAME: Rhonda Graves  Time Call Initiated: 11:39 Time Call Completed: 11:42 Total Call Time: 3 minutes  Current Medications:  Outpatient Medications Prior to Visit  Medication Sig Dispense Refill  . acetaminophen (TYLENOL) 325 MG tablet Take 2 tablets (650 mg total) by mouth every 6 (six) hours as needed. 60 tablet 0  . aspirin 81 MG chewable tablet Chew 81 mg by mouth daily.    . Cetirizine HCl (ZYRTEC ALLERGY) 10 MG CAPS Take 1 capsule (10 mg total) by mouth daily. 30 capsule 11  . EPIPEN 2-PAK 0.3 MG/0.3ML SOAJ injection Inject 1 Dose into the muscle as needed for anaphylaxis.  2  . esomeprazole (NEXIUM) 20 MG capsule Take 1 capsule (20 mg total) by mouth daily at 12 noon. 30 capsule 1  . Ferrous Gluconate (IRON 27 PO) Take 1 tablet by mouth daily.    . magic mouthwash SOLN Take 5 mLs by mouth 4 (four) times daily as needed for mouth pain. (Patient not taking: Reported on 07/16/2017) 120 mL 1  . oxyCODONE-acetaminophen (ROXICET) 5-325 MG tablet Take 1 tablet by mouth every 6 (six) hours as needed. 20 tablet 0  . Prenatal Vit-Fe Fumarate-FA (MULTIVITAMIN-PRENATAL) 27-0.8 MG TABS tablet Take 1 tablet by mouth daily at 12 noon.    . sertraline (ZOLOFT) 50 MG tablet Take 1 tablet (50 mg total) by mouth daily. 30 tablet 2  . valACYclovir (VALTREX) 1000 MG tablet Take 1 tablet (1,000 mg total) by mouth 2 (two) times daily. (Patient not taking: Reported on 08/12/2017) 20 tablet 1  . zolpidem (AMBIEN) 5 MG tablet Take 1 tablet (5 mg total) by mouth at bedtime as needed for sleep. 30 tablet 5   Facility-Administered Medications Prior to Visit  Medication Dose Route Frequency Provider Last Rate Last Dose  . prenatal vitamin w/FE, FA (NATACHEW) chewable tablet 1 tablet  1 tablet Oral Q1200 Truett Mainland, DO        Patient has been able to get all medications filled as prescribed: No: Pt is uncertain whether or not she  wants to begin taking Zoloft, but is considering picking it up, in case she feels she needs it later. She says she is not crying as much as the first week, and is starting to feel a little better.  Patient is currently taking all medications as prescribed: No:   Patient reports experiencing side effects: N/A  Patient describes feeling this way on medications: N/A  Additional patient concerns: Pt has no additional concerns at this time  Patient advised to schedule appointment with provider for evaluation of medication side effects or additional concerns: No   [place], LCSW

## 2017-09-09 ENCOUNTER — Ambulatory Visit: Payer: Medicaid Other

## 2017-09-11 ENCOUNTER — Ambulatory Visit: Payer: Medicaid Other | Admitting: Advanced Practice Midwife

## 2017-09-15 ENCOUNTER — Inpatient Hospital Stay (HOSPITAL_COMMUNITY)
Admission: AD | Admit: 2017-09-15 | Discharge: 2017-09-15 | Disposition: A | Discharge status: Home / Self Care | Payer: Medicaid Other | Source: Ambulatory Visit | Attending: Obstetrics & Gynecology | Admitting: Obstetrics & Gynecology

## 2017-09-15 ENCOUNTER — Encounter (HOSPITAL_COMMUNITY): Payer: Self-pay | Admitting: *Deleted

## 2017-09-15 DIAGNOSIS — Z4801 Encounter for change or removal of surgical wound dressing: Secondary | ICD-10-CM | POA: Insufficient documentation

## 2017-09-15 DIAGNOSIS — Z9889 Other specified postprocedural states: Secondary | ICD-10-CM | POA: Diagnosis not present

## 2017-09-15 DIAGNOSIS — L7682 Other postprocedural complications of skin and subcutaneous tissue: Secondary | ICD-10-CM

## 2017-09-15 DIAGNOSIS — K219 Gastro-esophageal reflux disease without esophagitis: Secondary | ICD-10-CM | POA: Insufficient documentation

## 2017-09-15 DIAGNOSIS — K589 Irritable bowel syndrome without diarrhea: Secondary | ICD-10-CM | POA: Insufficient documentation

## 2017-09-15 DIAGNOSIS — G8918 Other acute postprocedural pain: Secondary | ICD-10-CM | POA: Diagnosis present

## 2017-09-15 LAB — URINALYSIS, ROUTINE W REFLEX MICROSCOPIC
BILIRUBIN URINE: NEGATIVE
Glucose, UA: NEGATIVE mg/dL
HGB URINE DIPSTICK: NEGATIVE
KETONES UR: NEGATIVE mg/dL
NITRITE: NEGATIVE
PH: 5 (ref 5.0–8.0)
Protein, ur: NEGATIVE mg/dL
SPECIFIC GRAVITY, URINE: 1.008 (ref 1.005–1.030)

## 2017-09-15 NOTE — MAU Note (Signed)
Pt reports increased pain and incision slightly opening up draining som puss. Post Partum 11/25 c-section.

## 2017-09-15 NOTE — MAU Provider Note (Signed)
Chief Complaint: Wound Check   SUBJECTIVE HPI: Rhonda Graves is a 36 y.o. [id number] who presents to MAU for wound check. Patient concerned that her cesarean section scar may be infected. Patient had c/s on 11/25. States that Saturday she noticed some drainage and separation of the scar. Also started having pain around the incision. She tried using some peroxide and antibiotic ointment. Has not pulled off all the steri-strips. Denies fevers, warmth, or odor from incision. States she missed her wound check appointment in the clinic.    Past Medical History:  Diagnosis Date  . Abnormal Pap smear    had colpo in past, normal pap since then  . GERD (gastroesophageal reflux disease) Dx 2010  . Headache(784.0)    migraine  . History of oral herpes simplex infection 05/29/2017   +IgM 1/2 antibody screen done for cold sores vs aphthous ulcers  . Hypertension   . IBS (irritable bowel syndrome)   . Multiple allergies    OB History  Gravida Para Term Preterm AB Living  2 2 2     2   SAB TAB Ectopic Multiple Live Births        0 2    # Outcome Date GA Lbr Len/2nd Weight Sex Delivery Anes PTL Lv  2 Term 08/17/17 [redacted]w[redacted]d  3.161 kg (6 lb 15.5 oz) F CS-Vac EPI  LIV  1 Term 11/26/13 [redacted]w[redacted]d  3.31 kg (7 lb 4.8 oz) F CS-Vac EPI  LIV     Past Surgical History:  Procedure Laterality Date  . CESAREAN SECTION N/A 11/26/2013   Procedure: CESAREAN SECTION;  Surgeon: Lavonia Drafts, MD;  Location: [facility];  Service: Obstetrics;  Laterality: N/A;  . CESAREAN SECTION N/A 08/17/2017   Procedure: CESAREAN SECTION;  Surgeon: Jonnie Kind, MD;  Location: [facility];  Service: Obstetrics;  Laterality: N/A;   Social History   Socioeconomic History  . Marital status: Married    Spouse name: Not on file  . Number of children: 1   . Years of education: college   . Highest education level: Not on file  Social Needs  . Financial resource strain: Not on file  . Food insecurity - worry: Not on file  .  Food insecurity - inability: Not on file  . Transportation needs - medical: Not on file  . Transportation needs - non-medical: Not on file  Occupational History  . Occupation: Interpreter   Tobacco Use  . Smoking status: Never Smoker  . Smokeless tobacco: Never Used  Substance and Sexual Activity  . Alcohol use: No    Comment: Not currently drinking  . Drug use: No  . Sexual activity: Yes    Birth control/protection: None  Other Topics Concern  . Not on file  Social History Narrative   Originally from [place].   Has one daughter.   Lives with husband and daughter.    No current facility-administered medications on file prior to encounter.    Current Outpatient Medications on File Prior to Encounter  Medication Sig Dispense Refill  . acetaminophen (TYLENOL) 325 MG tablet Take 2 tablets (650 mg total) by mouth every 6 (six) hours as needed. 60 tablet 0  . aspirin 81 MG chewable tablet Chew 81 mg by mouth daily.    . Cetirizine HCl (ZYRTEC ALLERGY) 10 MG CAPS Take 1 capsule (10 mg total) by mouth daily. 30 capsule 11  . EPIPEN 2-PAK 0.3 MG/0.3ML SOAJ injection Inject 1 Dose into the muscle as needed for anaphylaxis.  2  . esomeprazole (NEXIUM) 20 MG capsule Take 1 capsule (20 mg total) by mouth daily at 12 noon. 30 capsule 1  . Ferrous Gluconate (IRON 27 PO) Take 1 tablet by mouth daily.    . magic mouthwash SOLN Take 5 mLs by mouth 4 (four) times daily as needed for mouth pain. (Patient not taking: Reported on 07/16/2017) 120 mL 1  . oxyCODONE-acetaminophen (ROXICET) 5-325 MG tablet Take 1 tablet by mouth every 6 (six) hours as needed. 20 tablet 0  . Prenatal Vit-Fe Fumarate-FA (MULTIVITAMIN-PRENATAL) 27-0.8 MG TABS tablet Take 1 tablet by mouth daily at 12 noon.    . sertraline (ZOLOFT) 50 MG tablet Take 1 tablet (50 mg total) by mouth daily. 30 tablet 2  . valACYclovir (VALTREX) 1000 MG tablet Take 1 tablet (1,000 mg total) by mouth 2 (two) times daily. (Patient not taking: Reported  on 08/12/2017) 20 tablet 1  . zolpidem (AMBIEN) 5 MG tablet Take 1 tablet (5 mg total) by mouth at bedtime as needed for sleep. 30 tablet 5   Allergies  Allergen Reactions  . Other Anaphylaxis    Walnuts  . 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 Peanut-Containing Drug Products Hives  . Hemabate [Carboprost Tromethamine] Itching and Swelling    Had allergic reaction after Lomotil, Lysteda and Hemabate. Uncertain what caused reaction.  . Lomotil [Diphenoxylate] Itching and Swelling    Had allergic reaction after Lomotil, Lysteda and Hemabate. Uncertain what caused reaction.  Marshell Levan [Tranexamic Acid] Itching and Swelling    Had allergic reaction after Lomotil, Lysteda and Hemabate. Uncertain what caused reaction.    I have reviewed the past Medical Hx, Surgical Hx, Social Hx, Allergies and Medications.   REVIEW OF SYSTEMS All systems reviewed and are negative for acute change except as noted in the HPI.   OBJECTIVE BP 125/79   Pulse (!) 55   Temp 98.5 F (36.9 C)   Resp 18   Ht 5\' 1"  (1.549 m)   Wt 65.8 kg (145 lb)   BMI 27.40 kg/m    PHYSICAL EXAM CONSTITUTIONAL: Well-developed, well-nourished female in no acute distress.  ENT: External right and left ear normal.  EYES: EOM intact, conjunctivae normal.  MUSCULOSKELETAL: Normal range of motion.  CARDIOVASCULAR: Regular heart rate RESPIRATORY: Normal effort NEUROLOGICAL: Alert and oriented to person, place, and time.  SKIN: Skin is warm and dry. No rash noted. Not diaphoretic. No erythema. No pallor. Incision: healed well. Small opening without drainage. Mild erythema from were steri-strips were. Slight .5cm induration but no warmth. PSYCH: Normal mood and affect. Normal behavior. Normal judgment and thought content.   LAB RESULTS Results for orders placed or performed during the hospital encounter of 09/15/17 (from the past 24 hour(s))  Urinalysis, Routine w reflex microscopic     Status: Abnormal   Collection Time:  09/15/17  1:02 PM  Result Value Ref Range   Color, Urine STRAW (A) YELLOW   APPearance CLEAR CLEAR   Specific Gravity, Urine 1.008 1.005 - 1.030   pH 5.0 5.0 - 8.0   Glucose, UA NEGATIVE NEGATIVE mg/dL   Hgb urine dipstick NEGATIVE NEGATIVE   Bilirubin Urine NEGATIVE NEGATIVE   Ketones, ur NEGATIVE NEGATIVE mg/dL   Protein, ur NEGATIVE NEGATIVE mg/dL   Nitrite NEGATIVE NEGATIVE   Leukocytes, UA SMALL (A) NEGATIVE   RBC / HPF 0-5 0 - 5 RBC/hpf   WBC, UA 0-5 0 - 5 WBC/hpf   Bacteria, UA RARE (A) NONE SEEN   Squamous Epithelial /  LPF 0-5 (A) NONE SEEN   Mucus PRESENT     IMAGING No results found.  MAU COURSE Vitals and nursing notes reviewed Incision does not appear infected at this time Dr. Roselie Awkward also came to assess.  Treatments given in MAU: None  MDM Plan of care reviewed with patient, including labs and tests ordered and medical treatment.   ASSESSMENT 1. Incisional pain     PLAN Discharge home in stable condition Reassurance given Counseled on return precautions  Conservative measures such as warm compresses Message to clinic sent to have patient be seen for postpartum visit sooner and to re-examine incision Handout given   Luiz Blare, DO [facility], [place] 09/15/2017, 1:55 PM

## 2017-09-15 NOTE — Discharge Instructions (Signed)
Apply warm compresses Can use antibiotic ointment; avoid peroxide Monitor for any fevers, warmth, foul smells May continue to drain some  Await clinic call for appointment

## 2017-09-18 ENCOUNTER — Encounter: Payer: Self-pay | Admitting: Obstetrics and Gynecology

## 2017-09-18 ENCOUNTER — Ambulatory Visit (INDEPENDENT_AMBULATORY_CARE_PROVIDER_SITE_OTHER): Payer: Medicaid Other | Admitting: Obstetrics and Gynecology

## 2017-09-18 DIAGNOSIS — G4701 Insomnia due to medical condition: Secondary | ICD-10-CM

## 2017-09-18 DIAGNOSIS — Z9889 Other specified postprocedural states: Secondary | ICD-10-CM

## 2017-09-18 DIAGNOSIS — Z3202 Encounter for pregnancy test, result negative: Secondary | ICD-10-CM | POA: Diagnosis present

## 2017-09-18 DIAGNOSIS — Z1389 Encounter for screening for other disorder: Secondary | ICD-10-CM | POA: Diagnosis not present

## 2017-09-18 LAB — POCT PREGNANCY, URINE: Preg Test, Ur: NEGATIVE

## 2017-09-18 MED ORDER — ZOLPIDEM TARTRATE 5 MG PO TABS: 5.0000 mg | ORAL_TABLET | Freq: Every evening | ORAL | 0 refills | Status: DC | PRN

## 2017-09-18 MED ORDER — NORETHINDRONE 0.35 MG PO TABS: 1.0000 | ORAL_TABLET | Freq: Every day | ORAL | 11 refills | Status: DC

## 2017-09-18 NOTE — Patient Instructions (Signed)
 Contraception Choices Contraception, also called birth control, refers to methods or devices that prevent pregnancy. Hormonal methods Contraceptive implant A contraceptive implant is a thin, plastic tube that contains a hormone. It is inserted into the upper part of the arm. It can remain in place for up to 3 years. Progestin-only injections Progestin-only injections are injections of progestin, a synthetic form of the hormone progesterone. They are given every 3 months by a health care provider. Birth control pills Birth control pills are pills that contain hormones that prevent pregnancy. They must be taken once a day, preferably at the same time each day. Birth control patch The birth control patch contains hormones that prevent pregnancy. It is placed on the skin and must be changed once a week for three weeks and removed on the fourth week. A prescription is needed to use this method of contraception. Vaginal ring A vaginal ring contains hormones that prevent pregnancy. It is placed in the vagina for three weeks and removed on the fourth week. After that, the process is repeated with a new ring. A prescription is needed to use this method of contraception. Emergency contraceptive Emergency contraceptives prevent pregnancy after unprotected sex. They come in pill form and can be taken up to 5 days after sex. They work best the sooner they are taken after having sex. Most emergency contraceptives are available without a prescription. This method should not be used as your only form of birth control. Barrier methods Female condom A female condom is a thin sheath that is worn over the penis during sex. Condoms keep sperm from going inside a woman's body. They can be used with a spermicide to increase their effectiveness. They should be disposed after a single use. Female condom A female condom is a soft, loose-fitting sheath that is put into the vagina before sex. The condom keeps sperm from  going inside a woman's body. They should be disposed after a single use. Diaphragm A diaphragm is a soft, dome-shaped barrier. It is inserted into the vagina before sex, along with a spermicide. The diaphragm blocks sperm from entering the uterus, and the spermicide kills sperm. A diaphragm should be left in the vagina for 6-8 hours after sex and removed within 24 hours. A diaphragm is prescribed and fitted by a health care provider. A diaphragm should be replaced every 1-2 years, after giving birth, after gaining more than 15 lb (6.8 kg), and after pelvic surgery. Cervical cap A cervical cap is a round, soft latex or plastic cup that fits over the cervix. It is inserted into the vagina before sex, along with spermicide. It blocks sperm from entering the uterus. The cap should be left in place for 6-8 hours after sex and removed within 48 hours. A cervical cap must be prescribed and fitted by a health care provider. It should be replaced every 2 years. Sponge A sponge is a soft, circular piece of polyurethane foam with spermicide on it. The sponge helps block sperm from entering the uterus, and the spermicide kills sperm. To use it, you make it wet and then insert it into the vagina. It should be inserted before sex, left in for at least 6 hours after sex, and removed and thrown away within 30 hours. Spermicides Spermicides are chemicals that kill or block sperm from entering the cervix and uterus. They can come as a cream, jelly, suppository, foam, or tablet. A spermicide should be inserted into the vagina with an applicator at least 10-15 minutes   sex to allow time for it to work. The process must be repeated every time you have sex. Spermicides do not require a prescription. Intrauterine contraception Intrauterine device (IUD) An IUD is a T-shaped device that is put in a woman's uterus. There are two types:  Hormone IUD.This type contains progestin, a synthetic form of the hormone progesterone. This  type can stay in place for 3-5 years.  Copper IUD.This type is wrapped in copper wire. It can stay in place for 10 years.  Permanent methods of contraception Female tubal ligation In this method, a woman's fallopian tubes are sealed, tied, or blocked during surgery to prevent eggs from traveling to the uterus. Hysteroscopic sterilization In this method, a small, flexible insert is placed into each fallopian tube. The inserts cause scar tissue to form in the fallopian tubes and block them, so sperm cannot reach an egg. The procedure takes about 3 months to be effective. Another form of birth control must be used during those 3 months. Female sterilization This is a procedure to tie off the tubes that carry sperm (vasectomy). After the procedure, the man can still ejaculate fluid (semen). Natural planning methods Natural family planning In this method, a couple does not have sex on days when the woman could become pregnant. Calendar method This means keeping track of the length of each menstrual cycle, identifying the days when pregnancy can happen, and not having sex on those days. Ovulation method In this method, a couple avoids sex during ovulation. Symptothermal method This method involves not having sex during ovulation. The woman typically checks for ovulation by watching changes in her temperature and in the consistency of cervical mucus. Post-ovulation method In this method, a couple waits to have sex until after ovulation. Summary  Contraception, also called birth control, means methods or devices that prevent pregnancy.  Hormonal methods of contraception include implants, injections, pills, patches, vaginal rings, and emergency contraceptives.  Barrier methods of contraception can include female condoms, female condoms, diaphragms, cervical caps, sponges, and spermicides.  There are two types of IUDs (intrauterine devices). An IUD can be put in a woman's uterus to prevent pregnancy  for 3-5 years.  Permanent sterilization can be done through a procedure for males, females, or both.  Natural family planning methods involve not having sex on days when the woman could become pregnant. This information is not intended to replace advice given to you by your health care provider. Make sure you discuss any questions you have with your health care provider. Document Released: 09/09/2005 Document Revised: 10/12/2016 Document Reviewed: 10/12/2016 Elsevier Interactive Patient Education  2018 Elsevier Inc.  

## 2017-09-18 NOTE — Progress Notes (Signed)
Subjective:     Rhonda Graves is a 36 y.o. female who presents for a postpartum visit. She is 4 weeks postpartum following a repeat low transverse c-section for failed induction with TOLAC for cHTN at 39 weeks. I have fully reviewed the prenatal and intrapartum course. Anesthesia: epidural. Postpartum course has been unremarkable. Baby's course has been unremarkable. Baby is feeding by breast. Bleeding brown. Bowel function is normal. Bladder function is normal. Patient is not sexually active. Contraception method is none. Postpartum depression screening: negative.  The following portions of the patient's history were reviewed and updated as appropriate: allergies, current medications, past family history, past medical history, past social history, past surgical history and problem list.  Review of Systems Pertinent items are noted in HPI.   Objective:    BP 117/81   Pulse (!) 57   Wt 143 lb 14.4 oz (65.3 kg)   Breastfeeding? Yes   BMI 27.19 kg/m   General:  alert, cooperative and appears stated age   Breasts:  deferred  Lungs: normal effort of breathing  Abdomen: Mildly tender to palpation at incision site, incision well healed at right aspect, small amount of redness at left aspect, well approximated, no oozing                             Assessment:    36 yo G2P2002 s/p RCS after failed induction. She is doing well, concerned about possible infection. No s/s wound infection, encouraged continued wound care as she just removed steristrips from left aspect. She is also requesting ambien refill, reviewed risks to infant with Lorrin Mais use while breastfeeding and should be used extremely cautiously, she will use only as needed. Also concerned about birth control, would like POPs for now but will consider IUD. Overall, doing well.  Plan:    1. Contraception: oral progesterone-only contraceptive sent to pharmacy 2. Okay to resume intercourse 3. She will consider IUD 4. F/u if no  improvement in pfannenstiel or if she is concerned about infection 5. Follow up in: 2 weeks or as needed for IUD insertion    K. Arvilla Meres, M.D. Attending [place], [facility] for [employer], [place]

## 2017-10-03 ENCOUNTER — Ambulatory Visit (INDEPENDENT_AMBULATORY_CARE_PROVIDER_SITE_OTHER): Payer: Medicaid Other | Admitting: Obstetrics and Gynecology

## 2017-10-03 ENCOUNTER — Encounter: Payer: Self-pay | Admitting: Obstetrics and Gynecology

## 2017-10-03 MED ORDER — CEPHALEXIN 500 MG PO CAPS: 500.0000 mg | ORAL_CAPSULE | Freq: Four times a day (QID) | ORAL | 0 refills | Status: DC

## 2017-10-03 NOTE — Progress Notes (Signed)
Here today for second postpartum for wound not healing completely.  Drainage on and off since 09/13/17.  Still having some pain at incision site.  Wants to discuss other contraception options.

## 2017-10-03 NOTE — Patient Instructions (Signed)
 Contraception Choices Contraception, also called birth control, refers to methods or devices that prevent pregnancy. Hormonal methods Contraceptive implant A contraceptive implant is a thin, plastic tube that contains a hormone. It is inserted into the upper part of the arm. It can remain in place for up to 3 years. Progestin-only injections Progestin-only injections are injections of progestin, a synthetic form of the hormone progesterone. They are given every 3 months by a health care provider. Birth control pills Birth control pills are pills that contain hormones that prevent pregnancy. They must be taken once a day, preferably at the same time each day. Birth control patch The birth control patch contains hormones that prevent pregnancy. It is placed on the skin and must be changed once a week for three weeks and removed on the fourth week. A prescription is needed to use this method of contraception. Vaginal ring A vaginal ring contains hormones that prevent pregnancy. It is placed in the vagina for three weeks and removed on the fourth week. After that, the process is repeated with a new ring. A prescription is needed to use this method of contraception. Emergency contraceptive Emergency contraceptives prevent pregnancy after unprotected sex. They come in pill form and can be taken up to 5 days after sex. They work best the sooner they are taken after having sex. Most emergency contraceptives are available without a prescription. This method should not be used as your only form of birth control. Barrier methods Female condom A female condom is a thin sheath that is worn over the penis during sex. Condoms keep sperm from going inside a woman's body. They can be used with a spermicide to increase their effectiveness. They should be disposed after a single use. Female condom A female condom is a soft, loose-fitting sheath that is put into the vagina before sex. The condom keeps sperm from  going inside a woman's body. They should be disposed after a single use. Diaphragm A diaphragm is a soft, dome-shaped barrier. It is inserted into the vagina before sex, along with a spermicide. The diaphragm blocks sperm from entering the uterus, and the spermicide kills sperm. A diaphragm should be left in the vagina for 6-8 hours after sex and removed within 24 hours. A diaphragm is prescribed and fitted by a health care provider. A diaphragm should be replaced every 1-2 years, after giving birth, after gaining more than 15 lb (6.8 kg), and after pelvic surgery. Cervical cap A cervical cap is a round, soft latex or plastic cup that fits over the cervix. It is inserted into the vagina before sex, along with spermicide. It blocks sperm from entering the uterus. The cap should be left in place for 6-8 hours after sex and removed within 48 hours. A cervical cap must be prescribed and fitted by a health care provider. It should be replaced every 2 years. Sponge A sponge is a soft, circular piece of polyurethane foam with spermicide on it. The sponge helps block sperm from entering the uterus, and the spermicide kills sperm. To use it, you make it wet and then insert it into the vagina. It should be inserted before sex, left in for at least 6 hours after sex, and removed and thrown away within 30 hours. Spermicides Spermicides are chemicals that kill or block sperm from entering the cervix and uterus. They can come as a cream, jelly, suppository, foam, or tablet. A spermicide should be inserted into the vagina with an applicator at least 10-15 minutes   sex to allow time for it to work. The process must be repeated every time you have sex. Spermicides do not require a prescription. Intrauterine contraception Intrauterine device (IUD) An IUD is a T-shaped device that is put in a woman's uterus. There are two types:  Hormone IUD.This type contains progestin, a synthetic form of the hormone progesterone. This  type can stay in place for 3-5 years.  Copper IUD.This type is wrapped in copper wire. It can stay in place for 10 years.  Permanent methods of contraception Female tubal ligation In this method, a woman's fallopian tubes are sealed, tied, or blocked during surgery to prevent eggs from traveling to the uterus. Hysteroscopic sterilization In this method, a small, flexible insert is placed into each fallopian tube. The inserts cause scar tissue to form in the fallopian tubes and block them, so sperm cannot reach an egg. The procedure takes about 3 months to be effective. Another form of birth control must be used during those 3 months. Female sterilization This is a procedure to tie off the tubes that carry sperm (vasectomy). After the procedure, the man can still ejaculate fluid (semen). Natural planning methods Natural family planning In this method, a couple does not have sex on days when the woman could become pregnant. Calendar method This means keeping track of the length of each menstrual cycle, identifying the days when pregnancy can happen, and not having sex on those days. Ovulation method In this method, a couple avoids sex during ovulation. Symptothermal method This method involves not having sex during ovulation. The woman typically checks for ovulation by watching changes in her temperature and in the consistency of cervical mucus. Post-ovulation method In this method, a couple waits to have sex until after ovulation. Summary  Contraception, also called birth control, means methods or devices that prevent pregnancy.  Hormonal methods of contraception include implants, injections, pills, patches, vaginal rings, and emergency contraceptives.  Barrier methods of contraception can include female condoms, female condoms, diaphragms, cervical caps, sponges, and spermicides.  There are two types of IUDs (intrauterine devices). An IUD can be put in a woman's uterus to prevent pregnancy  for 3-5 years.  Permanent sterilization can be done through a procedure for males, females, or both.  Natural family planning methods involve not having sex on days when the woman could become pregnant. This information is not intended to replace advice given to you by your health care provider. Make sure you discuss any questions you have with your health care provider. Document Released: 09/09/2005 Document Revised: 10/12/2016 Document Reviewed: 10/12/2016 Elsevier Interactive Patient Education  2018 Elsevier Inc.  

## 2017-10-03 NOTE — Progress Notes (Signed)
37yo G2P2 here for wound check. Patient is 6 weeks postpartum following a repeat c-section. She is breastfeeding and using micronor for contraception. Patient reports the presence of drainage from her incision a few weeks following her delivery. She states that over the past few weeks she noticed purulent drainage. She reports some soreness at the incision site. She denies any fever.  Past Medical History:  Diagnosis Date  . Abnormal Pap smear    had colpo in past, normal pap since then  . GERD (gastroesophageal reflux disease) Dx 2010  . Headache(784.0)    migraine  . History of oral herpes simplex infection 05/29/2017   +IgM 1/2 antibody screen done for cold sores vs aphthous ulcers  . Hypertension   . IBS (irritable bowel syndrome)   . Multiple allergies    Past Surgical History:  Procedure Laterality Date  . CESAREAN SECTION N/A 11/26/2013   Procedure: CESAREAN SECTION;  Surgeon: Lavonia Drafts, MD;  Location: [facility];  Service: Obstetrics;  Laterality: N/A;  . CESAREAN SECTION N/A 08/17/2017   Procedure: CESAREAN SECTION;  Surgeon: Jonnie Kind, MD;  Location: [facility];  Service: Obstetrics;  Laterality: N/A;   Family History  Problem Relation Age of Onset  . Hypothyroidism Mother   . Arthritis Mother   . Asthma Brother    Social History   Tobacco Use  . Smoking status: Never Smoker  . Smokeless tobacco: Never Used  Substance Use Topics  . Alcohol use: No    Comment: Not currently drinking  . Drug use: No   ROS See pertinent in HPI  Blood pressure (!) 149/98, pulse (!) 55, height 5\' 1"  (1.549 m), weight 142 lb (64.4 kg), currently breastfeeding. GENERAL: Well-developed, well-nourished female in no acute distress.  ABDOMEN: Soft, nontender, nondistended. No organomegaly. Incision: central area of erythema with mild tenderness. No induration. Skin well approximated with 2 scabs involving incision line. No expressible discharge. EXTREMITIES: No  cyanosis, clubbing, or edema, 2+ distal pulses.  A/P 37 yo here for wound check - Rx Keflex provided for presumed superficial skin infection - Patient is contemplating using Mirena IUD for contraception but will continue with micronor for now - Elevated BP today. Advised patient to monitor BP at home and to follow up with PCP if persistently elevated (greater than 140/90) - Patient to contact office if erythema does not resolve - Patient cleared to resume all activities of daily living. Patient has already returned to work

## 2017-10-06 ENCOUNTER — Ambulatory Visit: Payer: Medicaid Other | Admitting: Obstetrics and Gynecology

## 2017-12-19 ENCOUNTER — Ambulatory Visit: Payer: Medicaid Other | Attending: Internal Medicine

## 2017-12-28 ENCOUNTER — Other Ambulatory Visit: Payer: Self-pay

## 2017-12-28 ENCOUNTER — Ambulatory Visit (HOSPITAL_COMMUNITY)
Admission: EM | Admit: 2017-12-28 | Discharge: 2017-12-28 | Disposition: A | Discharge status: Home / Self Care | Payer: Medicaid Other | Attending: Physician Assistant | Admitting: Physician Assistant

## 2017-12-28 DIAGNOSIS — B9789 Other viral agents as the cause of diseases classified elsewhere: Secondary | ICD-10-CM

## 2017-12-28 DIAGNOSIS — J069 Acute upper respiratory infection, unspecified: Secondary | ICD-10-CM

## 2017-12-28 DIAGNOSIS — R05 Cough: Secondary | ICD-10-CM

## 2017-12-28 MED ORDER — IBUPROFEN 600 MG PO TABS: 600.0000 mg | ORAL_TABLET | Freq: Four times a day (QID) | ORAL | 0 refills | Status: DC | PRN

## 2017-12-28 NOTE — Discharge Instructions (Signed)
Of the options available for your symptoms Tessalon and Zyrtec-D the best choices for breast-feeding mothers as there can be side effects to the infant.  Think is best to stay with ibuprofen.  If your husband tests positive for strep I will certainly treat you as well with antibiotics.

## 2017-12-28 NOTE — ED Triage Notes (Signed)
Friday, body aches, sore throat, headaches, some cough,

## 2017-12-28 NOTE — ED Provider Notes (Signed)
12/28/2017 5:32 PM   DOB: 08-22-1981 / MRN: [id number]  SUBJECTIVE:  Rhonda Graves is a 37 y.o. female presenting for sore thorat that started three days ago. She is eating and drinking well. She is taking tylenol and ibuprofen. She has mild cough and associates fatigue.     She is allergic to other; peach [prunus persica]; peanut-containing drug products; hemabate [carboprost tromethamine]; lomotil [diphenoxylate]; and lysteda [tranexamic acid].   She  has a past medical history of Abnormal Pap smear, GERD (gastroesophageal reflux disease) (Dx 2010), Headache(784.0), History of oral herpes simplex infection (05/29/2017), Hypertension, IBS (irritable bowel syndrome), and Multiple allergies.    She  reports that she has never smoked. She has never used smokeless tobacco. She reports that she does not drink alcohol or use drugs. She  reports that she currently engages in sexual activity. She reports using the following method of birth control/protection: None. The patient  has a past surgical history that includes Cesarean section (N/A, 11/26/2013) and Cesarean section (N/A, 08/17/2017).  Her family history includes Arthritis in her mother; Asthma in her brother; Hypothyroidism in her mother.  Review of Systems  Constitutional: Negative for chills, diaphoresis and fever.  Eyes: Negative.   Respiratory: Negative for cough, hemoptysis, sputum production, shortness of breath and wheezing.   Cardiovascular: Negative for chest pain.  Gastrointestinal: Negative for abdominal pain, blood in stool, constipation, diarrhea, heartburn, melena, nausea and vomiting.  Genitourinary: Negative for flank pain.  Skin: Negative for rash.  Neurological: Negative for dizziness, sensory change, speech change, focal weakness and headaches.    OBJECTIVE:  BP 128/78 (BP Location: Left Arm)   Pulse (!) 56   Temp 98 F (36.7 C) (Oral)   SpO2 100%   Breastfeeding? Yes   Physical Exam  Constitutional: She is active.   Non-toxic appearance.  Cardiovascular: Normal rate, regular rhythm, S1 normal, S2 normal, normal heart sounds and intact distal pulses. Exam reveals no gallop, no friction rub and no decreased pulses.  No murmur heard. Pulmonary/Chest: Effort normal. No stridor. No tachypnea. No respiratory distress. She has no wheezes. She has no rales.  Abdominal: She exhibits no distension.  Musculoskeletal: She exhibits no edema.  Neurological: She is alert.  Skin: Skin is warm and dry. She is not diaphoretic. No pallor.    No results found for this or any previous visit (from the past 72 hour(s)).  No results found.  ASSESSMENT AND PLAN:  No orders of the defined types were placed in this encounter.    Viral URI with cough: HPI and exam are reassuring.  Essentially the whole family is sick with a respiratory virus.  She is breast-feeding.  Advised fluids and ibuprofen.      The patient is advised to call or return to clinic if she does not see an improvement in symptoms, or to seek the care of the closest emergency department if she worsens with the above plan.   Philis Fendt, MHS, PA-C 12/28/2017 5:32 PM    Tereasa Coop, PA-C 12/28/17 1733

## 2017-12-31 ENCOUNTER — Encounter: Payer: Self-pay | Admitting: Nurse Practitioner

## 2017-12-31 ENCOUNTER — Ambulatory Visit: Payer: Self-pay | Attending: Nurse Practitioner | Admitting: Nurse Practitioner

## 2017-12-31 VITALS — BP 133/83 | HR 56 | Temp 98.8°F | Ht 61.0 in | Wt 138.0 lb

## 2017-12-31 DIAGNOSIS — B9789 Other viral agents as the cause of diseases classified elsewhere: Secondary | ICD-10-CM | POA: Insufficient documentation

## 2017-12-31 DIAGNOSIS — J028 Acute pharyngitis due to other specified organisms: Secondary | ICD-10-CM

## 2017-12-31 DIAGNOSIS — G4700 Insomnia, unspecified: Secondary | ICD-10-CM | POA: Insufficient documentation

## 2017-12-31 DIAGNOSIS — K589 Irritable bowel syndrome without diarrhea: Secondary | ICD-10-CM | POA: Insufficient documentation

## 2017-12-31 DIAGNOSIS — G4709 Other insomnia: Secondary | ICD-10-CM

## 2017-12-31 DIAGNOSIS — Z760 Encounter for issue of repeat prescription: Secondary | ICD-10-CM | POA: Insufficient documentation

## 2017-12-31 DIAGNOSIS — J3089 Other allergic rhinitis: Secondary | ICD-10-CM

## 2017-12-31 DIAGNOSIS — Z3009 Encounter for other general counseling and advice on contraception: Secondary | ICD-10-CM

## 2017-12-31 DIAGNOSIS — J302 Other seasonal allergic rhinitis: Secondary | ICD-10-CM | POA: Insufficient documentation

## 2017-12-31 DIAGNOSIS — K219 Gastro-esophageal reflux disease without esophagitis: Secondary | ICD-10-CM | POA: Insufficient documentation

## 2017-12-31 DIAGNOSIS — J029 Acute pharyngitis, unspecified: Secondary | ICD-10-CM | POA: Insufficient documentation

## 2017-12-31 DIAGNOSIS — I10 Essential (primary) hypertension: Secondary | ICD-10-CM | POA: Insufficient documentation

## 2017-12-31 DIAGNOSIS — Z0000 Encounter for general adult medical examination without abnormal findings: Secondary | ICD-10-CM

## 2017-12-31 MED ORDER — ETONOGESTREL-ETHINYL ESTRADIOL 0.12-0.015 MG/24HR VA RING: VAGINAL_RING | VAGINAL | 12 refills | Status: DC

## 2017-12-31 MED ORDER — TRAZODONE HCL 50 MG PO TABS: 25.0000 mg | ORAL_TABLET | Freq: Every evening | ORAL | 3 refills | Status: DC | PRN

## 2017-12-31 MED FILL — NUVARING VAGINAL RING: 0.12-0.015 | 28 days supply | Qty: 1 | Fill #0

## 2017-12-31 MED FILL — traZODone HCL 50 MG TABS: 50 | 30 days supply | Qty: 30 | Fill #0

## 2017-12-31 NOTE — Patient Instructions (Signed)
Allergy Shots, Adult Allergy shots, also known as allergen immunotherapy, is a treatment that helps reduce allergy symptoms or conditions such as:  Sneezing.  Itchy, watery eyes.  A runny, stuffy nose.  Asthma.  The treatment may benefit people who are allergic to any of these substances:  Pollen from grasses, trees, plants, and weeds.  Insect venom.  Animal dander.  House dust mites.  Molds.  The treatment is not done for food allergies. During your allergy immunotherapy treatments, the substance that you are allergic to (allergen) will be injected under your skin.  At first, only a little bit of the substance will be given.  Over time, the amount will slowly be increased.  The treatments allow your body to build up a tolerance (immunity) to the allergen and create proteins called antibodies that block the effect that the allergen has on your body.  Most people start by getting shots 1-3 times a week for 3-6 months, then they continue to get maintenance shots about one time per month for life. Some people can stop getting shots after 3-5 years. It may be necessary to stop this treatment if:  The shots do not work for you.  You start taking certain medicines that interact with the shots.  You miss many appointments for your shots.  Allergy tablets given under the tongue is another treatment option that may work for certain types of allergies. Tell a health care provider about:  All medicines you are taking, including vitamins, herbs, eye drops, creams, and over-the-counter medicines. If you are taking certain types of heart medicine (beta blockers), you may not be able to receive allergy shots.  Any blood disorders you have.  Any medical conditions you have, especially asthma.  Whether you are pregnant or may be pregnant. What are the risks? This treatment can cause side effects. The most common side effects affect the injection area and include mild redness and  swelling. They often go away on their own. Serious reactions, while rare, can happen. A serious reaction that spreads throughout the body (systemic reaction) may require immediate treatment by a health care provider. Reactions usually happen within the first 30 minutes after the shots (injections), but they can occur later. What happens before the procedure?  Let your health care provider know if you are not feeling well the day of the injection. What happens during the procedure?  The allergen will be injected into your skin. The procedure may vary among health care providers and hospitals. What happens after the procedure?  You will need to stay at the clinic for up to 30 minutes so a health care provider can be sure that you do not have serious side effects.  The shots will begin to work shortly after you begin treatment, but your allergy symptoms may not improve for 3-12 months.  Get help right away if you have any symptoms of a systemic reaction. These include: ? Itchy, red, swollen areas of skin (hives) or rash. ? Itchy eyes, nose, or throat. ? Runny nose or nasal congestion. ? Coughing or trouble breathing. ? Wheezing. ? Chest tightness. ? Swelling of the throat. ? Nausea. ? Dizziness.  Keep all follow-up visits as told by your health care provider. This is important. Summary  Allergen immunotherapy is a treatment that helps reduce allergy symptoms.  Before receiving injections, tell a health care provider about all medicines you are taking.  The most common side effects of allergy shots are mild redness and swelling at the  injection site.  There is a risk of a serious allergic reaction. Seek medical care right away if you develop wheezing, chest tightness, or any trouble with breathing. This information is not intended to replace advice given to you by your health care provider. Make sure you discuss any questions you have with your health care provider. Document  Released: 06/18/2008 Document Revised: 07/29/2016 Document Reviewed: 07/29/2016 Elsevier Interactive Patient Education  2018 [employer]. Ethinyl Estradiol; Etonogestrel vaginal ring What is this medicine? ETHINYL ESTRADIOL; ETONOGESTREL (ETH in il es tra DYE ole; et oh noe JES trel) vaginal ring is a flexible, vaginal ring used as a contraceptive (birth control method). This medicine combines two types of female hormones, an estrogen and a progestin. This ring is used to prevent ovulation and pregnancy. Each ring is effective for one month. This medicine may be used for other purposes; ask your health care provider or pharmacist if you have questions. COMMON BRAND NAME(S): NuvaRing What should I tell my health care provider before I take this medicine? They need to know if you have or ever had any of these conditions: -abnormal vaginal bleeding -blood vessel disease or blood clots -breast, cervical, endometrial, ovarian, liver, or uterine cancer -diabetes -gallbladder disease -heart disease or recent heart attack -high blood pressure -high cholesterol -kidney disease -liver disease -migraine headaches -stroke -systemic lupus erythematosus (SLE) -tobacco smoker -an unusual or allergic reaction to estrogens, progestins, other medicines, foods, dyes, or preservatives -pregnant or trying to get pregnant -breast-feeding How should I use this medicine? Insert the ring into your vagina as directed. Follow the directions on the prescription label. The ring will remain place for 3 weeks and is then removed for a 1-week break. A new ring is inserted 1 week after the last ring was removed, on the same day of the week. Check often to make sure the ring is still in place, especially before and after sexual intercourse. If the ring was out of the vagina for an unknown amount of time, you may not be protected from pregnancy. Perform a pregnancy test and call your doctor. Do not use more often than  directed. A patient package insert for the product will be given with each prescription and refill. Read this sheet carefully each time. The sheet may change frequently. Contact your pediatrician regarding the use of this medicine in children. Special care may be needed. This medicine has been used in female children who have started having menstrual periods. Overdosage: If you think you have taken too much of this medicine contact a poison control center or emergency room at once. NOTE: This medicine is only for you. Do not share this medicine with others. What if I miss a dose? You will need to replace your vaginal ring once a month as directed. If the ring should slip out, or if you leave it in longer or shorter than you should, contact your health care professional for advice. What may interact with this medicine? Do not take this medicine with the following medication: -dasabuvir; ombitasvir; paritaprevir; ritonavir -ombitasvir; paritaprevir; ritonavir This medicine may also interact with the following medications: -acetaminophen -antibiotics or medicines for infections, especially rifampin, rifabutin, rifapentine, and griseofulvin, and possibly penicillins or tetracyclines -aprepitant -ascorbic acid (vitamin C) -atorvastatin -barbiturate medicines, such as phenobarbital -bosentan -carbamazepine -caffeine -clofibrate -cyclosporine -dantrolene -doxercalciferol -felbamate -grapefruit juice -hydrocortisone -medicines for anxiety or sleeping problems, such as diazepam or temazepam -medicines for diabetes, including pioglitazone -modafinil -mycophenolate -nefazodone -oxcarbazepine -phenytoin -prednisolone -ritonavir or other medicines  for HIV infection or AIDS -rosuvastatin -selegiline -soy isoflavones supplements -St. John's wort -tamoxifen or raloxifene -theophylline -thyroid hormones -topiramate -warfarin This list may not describe all possible interactions. Give your  health care provider a list of all the medicines, herbs, non-prescription drugs, or dietary supplements you use. Also tell them if you smoke, drink alcohol, or use illegal drugs. Some items may interact with your medicine. What should I watch for while using this medicine? Visit your doctor or health care professional for regular checks on your progress. You will need a regular breast and pelvic exam and Pap smear while on this medicine. Use an additional method of contraception during the first cycle that you use this ring. Do not use a diaphragm or female condom, as the ring can interfere with these birth control methods and their proper placement. If you have any reason to think you are pregnant, stop using this medicine right away and contact your doctor or health care professional. If you are using this medicine for hormone related problems, it may take several cycles of use to see improvement in your condition. Smoking increases the risk of getting a blood clot or having a stroke while you are using hormonal birth control, especially if you are more than 36 years old. You are strongly advised not to smoke. This medicine can make your body retain fluid, making your fingers, hands, or ankles swell. Your blood pressure can go up. Contact your doctor or health care professional if you feel you are retaining fluid. This medicine can make you more sensitive to the sun. Keep out of the sun. If you cannot avoid being in the sun, wear protective clothing and use sunscreen. Do not use sun lamps or tanning beds/booths. If you wear contact lenses and notice visual changes, or if the lenses begin to feel uncomfortable, consult your eye care specialist. In some women, tenderness, swelling, or minor bleeding of the gums may occur. Notify your dentist if this happens. Brushing and flossing your teeth regularly may help limit this. See your dentist regularly and inform your dentist of the medicines you are  taking. If you are going to have elective surgery, you may need to stop using this medicine before the surgery. Consult your health care professional for advice. This medicine does not protect you against HIV infection (AIDS) or any other sexually transmitted diseases. What side effects may I notice from receiving this medicine? Side effects that you should report to your doctor or health care professional as soon as possible: -breast tissue changes or discharge -changes in vaginal bleeding during your period or between your periods -chest pain -coughing up blood -dizziness or fainting spells -headaches or migraines -leg, arm or groin pain -severe or sudden headaches -stomach pain (severe) -sudden shortness of breath -sudden loss of coordination, especially on one side of the body -speech problems -symptoms of vaginal infection like itching, irritation or unusual discharge -tenderness in the upper abdomen -vomiting -weakness or numbness in the arms or legs, especially on one side of the body -yellowing of the eyes or skin Side effects that usually do not require medical attention (report to your doctor or health care professional if they continue or are bothersome): -breakthrough bleeding and spotting that continues beyond the 3 initial cycles of pills -breast tenderness -mood changes, anxiety, depression, frustration, anger, or emotional outbursts -increased sensitivity to sun or ultraviolet light -nausea -skin rash, acne, or brown spots on the skin -weight gain (slight) This list may not describe all  possible side effects. Call your doctor for medical advice about side effects. You may report side effects to FDA at 1-800-FDA-1088. Where should I keep my medicine? Keep out of the reach of children. Store at room temperature between 15 and 30 degrees C (59 and 86 degrees F) for up to 4 months. The product will expire after 4 months. Protect from light. Throw away any unused medicine  after the expiration date. NOTE: This sheet is a summary. It may not cover all possible information. If you have questions about this medicine, talk to your doctor, pharmacist, or health care provider.  2018 Elsevier/Gold Standard (2016-05-17 17:00:31)

## 2017-12-31 NOTE — Progress Notes (Signed)
Assessment & Plan:  [place] was seen today for establish care, sore throat and medication refill.  Diagnoses and all orders for this visit:  Sore throat (viral) -     CBC  Encounter for other general counseling or advice on contraception -     etonogestrel-ethinyl estradiol (NUVARING) 0.12-0.015 MG/24HR vaginal ring; Insert vaginally and leave in place for 3 consecutive weeks, then remove for 1 week. She was instructed that she can not start the nuvaring until after she has completely stopped breastfeeding. She verbalized understanding.   Environmental and seasonal allergies -     Ambulatory referral to Allergy  Other insomnia -     traZODone (DESYREL) 50 MG tablet; Take 0.5-1 tablets (25-50 mg total) by mouth at bedtime as needed for sleep.  Routine adult health maintenance -     CMP14+EGFR    Patient has been counseled on age-appropriate routine health concerns for screening and prevention. These are reviewed and up-to-date. Referrals have been placed accordingly. Immunizations are up-to-date or declined.    Subjective:   Chief Complaint  Patient presents with  . Establish Care    Pt. is here to establish care.   . Sore Throat    Pt. stated her throat hurts and would like for the PCP to look at it. Pt. stated started Friday night.   . Medication Refill    Pt. would like to see if she can get other options for birth control.    HPI Vi Haughton 37 y.o. female presents to office today to establish care. She has a history of hypertension and IBS. She has seen GI in the past and was prescribed Levsin and simethicone.   Essential Hypertension Chronic. She has been on Norvasc '10mg'$  and HCTZ '25mg'$  in the past. She was taken off amlodipine due to normal blood pressure readings.  BP Readings from Last 3 Encounters:  12/31/17 133/83  12/28/17 128/78  10/03/17 (!) 149/98   Insomnia Difficulty falling asleep. Is asking if she can have her ambien refilled. I instructed her that I  would not be willing to prescribe ambien due to its addictive properties. She is agreeable to try trazodone today.   Allergies She is requesting to be seen by an allergist for possible immunotherapy. Taking zyrtec as prescribed with little relief of symptoms. Symptoms occurring all year round. She states she was told by her previous provider that she should look into immunotherapy. She has tried epinephrine injection  in the past with good results.   Review of Systems  Constitutional: Negative for fever, malaise/fatigue and weight loss.  HENT: Positive for sore throat. Negative for nosebleeds.   Eyes: Negative.  Negative for blurred vision, double vision and photophobia.  Respiratory: Negative.  Negative for cough and shortness of breath.   Cardiovascular: Negative.  Negative for chest pain, palpitations and leg swelling.  Gastrointestinal: Positive for abdominal pain (hx of ibs) and heartburn. Negative for nausea and vomiting.  Musculoskeletal: Negative.  Negative for myalgias.  Neurological: Positive for headaches (migraines). Negative for dizziness, focal weakness and seizures.  Endo/Heme/Allergies: Positive for environmental allergies. Bruises/bleeds easily.  Psychiatric/Behavioral: Negative for suicidal ideas. The patient has insomnia.     Past Medical History:  Diagnosis Date  . Abnormal Pap smear    had colpo in past, normal pap since then  . GERD (gastroesophageal reflux disease) Dx 2010  . Headache(784.0)    migraine  . History of oral herpes simplex infection 05/29/2017   +IgM 1/2 antibody screen done  for cold sores vs aphthous ulcers  . IBS (irritable bowel syndrome)   . Multiple allergies     Past Surgical History:  Procedure Laterality Date  . CESAREAN SECTION N/A 11/26/2013   Procedure: CESAREAN SECTION;  Surgeon: Lavonia Drafts, MD;  Location: [facility];  Service: Obstetrics;  Laterality: N/A;  . CESAREAN SECTION N/A 08/17/2017   Procedure: CESAREAN SECTION;   Surgeon: Jonnie Kind, MD;  Location: [facility];  Service: Obstetrics;  Laterality: N/A;    Family History  Problem Relation Age of Onset  . Hypothyroidism Mother   . Arthritis Mother   . Asthma Brother     Social History Reviewed with no changes to be made today.   Outpatient Medications Prior to Visit  Medication Sig Dispense Refill  . Cetirizine HCl (ZYRTEC ALLERGY) 10 MG CAPS Take 1 capsule (10 mg total) by mouth daily. 30 capsule 11  . EPIPEN 2-PAK 0.3 MG/0.3ML SOAJ injection Inject 1 Dose into the muscle as needed for anaphylaxis.  2  . norethindrone (MICRONOR,CAMILA,ERRIN) 0.35 MG tablet Take 1 tablet (0.35 mg total) by mouth daily. 1 Package 11  . Prenatal Vit-Fe Fumarate-FA (MULTIVITAMIN-PRENATAL) 27-0.8 MG TABS tablet Take 1 tablet by mouth daily at 12 noon.    Marland Kitchen zolpidem (AMBIEN) 5 MG tablet Take 1 tablet (5 mg total) by mouth at bedtime as needed for sleep. 15 tablet 0  . acetaminophen (TYLENOL) 325 MG tablet Take 2 tablets (650 mg total) by mouth every 6 (six) hours as needed. (Patient not taking: Reported on 12/31/2017) 60 tablet 0  . Ferrous Gluconate (IRON 27 PO) Take 1 tablet by mouth daily.    Marland Kitchen ibuprofen (ADVIL,MOTRIN) 600 MG tablet Take 1 tablet (600 mg total) by mouth every 6 (six) hours as needed. (Patient not taking: Reported on 12/31/2017) 30 tablet 0  . cephALEXin (KEFLEX) 500 MG capsule Take 1 capsule (500 mg total) by mouth 4 (four) times daily. 28 capsule 0   No facility-administered medications prior to visit.     Allergies  Allergen Reactions  . Other Anaphylaxis    Walnuts  . 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 Peanut-Containing Drug Products Hives  . Hemabate [Carboprost Tromethamine] Itching and Swelling    Had allergic reaction after Lomotil, Lysteda and Hemabate. Uncertain what caused reaction.  . Lomotil [Diphenoxylate] Itching and Swelling    Had allergic reaction after Lomotil, Lysteda and Hemabate. Uncertain what caused reaction.   Marshell Levan [Tranexamic Acid] Itching and Swelling    Had allergic reaction after Lomotil, Lysteda and Hemabate. Uncertain what caused reaction.       Objective:    BP 133/83 (BP Location: Right Arm, Patient Position: Sitting, Cuff Size: Normal)   Pulse (!) 56   Temp 98.8 F (37.1 C) (Oral)   Ht '5\' 1"'$  (1.549 m)   Wt 138 lb (62.6 kg)   SpO2 98%   BMI 26.07 kg/m  Wt Readings from Last 3 Encounters:  12/31/17 138 lb (62.6 kg)  10/03/17 142 lb (64.4 kg)  09/18/17 143 lb 14.4 oz (65.3 kg)    Physical Exam  Constitutional: She is oriented to person, place, and time. She appears well-developed and well-nourished. She is cooperative.  HENT:  Head: Normocephalic and atraumatic.  Eyes: EOM are normal.  Neck: Normal range of motion.  Cardiovascular: Normal rate, regular rhythm and normal heart sounds. Exam reveals no gallop and no friction rub.  No murmur heard. Pulmonary/Chest: Effort normal and breath sounds normal. No tachypnea. No respiratory  distress. She has no decreased breath sounds. She has no wheezes. She has no rhonchi. She has no rales. She exhibits no tenderness.  Abdominal: Soft. Bowel sounds are normal.  Musculoskeletal: Normal range of motion. She exhibits no edema.  Neurological: She is alert and oriented to person, place, and time. Coordination normal.  Skin: Skin is warm and dry.  Psychiatric: She has a normal mood and affect. Her behavior is normal. Judgment and thought content normal.  Nursing note and vitals reviewed.      Patient has been counseled extensively about nutrition and exercise as well as the importance of adherence with medications and regular follow-up. The patient was given clear instructions to go to ER or return to medical center if symptoms don't improve, worsen or new problems develop. The patient verbalized understanding.   Follow-up: Return if symptoms worsen or fail to improve.   Gildardo Pounds, FNP-BC [facility] and  [facility] [place], [place]   12/31/2017, 5:15 PM

## 2018-01-01 LAB — CBC
Hematocrit: 39.3 % (ref 34.0–46.6)
Hemoglobin: 12.8 g/dL (ref 11.1–15.9)
MCH: 30.4 pg (ref 26.6–33.0)
MCHC: 32.6 g/dL (ref 31.5–35.7)
MCV: 93 fL (ref 79–97)
PLATELETS: 312 10*3/uL (ref 150–379)
RBC: 4.21 x10E6/uL (ref 3.77–5.28)
RDW: 13.4 % (ref 12.3–15.4)
WBC: 8 10*3/uL (ref 3.4–10.8)

## 2018-01-01 LAB — CMP14+EGFR
A/G RATIO: 1.6 (ref 1.2–2.2)
ALT: 29 IU/L (ref 0–32)
AST: 27 IU/L (ref 0–40)
Albumin: 4.5 g/dL (ref 3.5–5.5)
Alkaline Phosphatase: 116 IU/L (ref 39–117)
BUN/Creatinine Ratio: 23 (ref 9–23)
BUN: 17 mg/dL (ref 6–20)
Bilirubin Total: 0.3 mg/dL (ref 0.0–1.2)
CALCIUM: 9.2 mg/dL (ref 8.7–10.2)
CO2: 24 mmol/L (ref 20–29)
CREATININE: 0.75 mg/dL (ref 0.57–1.00)
Chloride: 104 mmol/L (ref 96–106)
GFR, EST AFRICAN AMERICAN: 119 mL/min/{1.73_m2} (ref >59)
GFR, EST NON AFRICAN AMERICAN: 104 mL/min/{1.73_m2} (ref >59)
GLOBULIN, TOTAL: 2.9 g/dL (ref 1.5–4.5)
Glucose: 75 mg/dL (ref 65–99)
POTASSIUM: 4.3 mmol/L (ref 3.5–5.2)
SODIUM: 141 mmol/L (ref 134–144)
Total Protein: 7.4 g/dL (ref 6.0–8.5)

## 2018-01-27 MED FILL — **NUVARING VAGINAL RING: 0.12-0.015 | 28 days supply | Qty: 1 | Fill #1

## 2018-02-04 ENCOUNTER — Encounter: Payer: Self-pay | Admitting: *Deleted

## 2018-02-05 ENCOUNTER — Ambulatory Visit (INDEPENDENT_AMBULATORY_CARE_PROVIDER_SITE_OTHER): Payer: Self-pay | Admitting: Allergy

## 2018-02-05 ENCOUNTER — Encounter: Payer: Self-pay | Admitting: Allergy

## 2018-02-05 VITALS — BP 110/68 | HR 60 | Temp 98.1°F | Resp 18 | Ht 60.0 in | Wt 137.4 lb

## 2018-02-05 DIAGNOSIS — Z9109 Other allergy status, other than to drugs and biological substances: Secondary | ICD-10-CM

## 2018-02-05 DIAGNOSIS — Z91018 Allergy to other foods: Secondary | ICD-10-CM

## 2018-02-05 DIAGNOSIS — J3089 Other allergic rhinitis: Secondary | ICD-10-CM

## 2018-02-05 DIAGNOSIS — L299 Pruritus, unspecified: Secondary | ICD-10-CM

## 2018-02-05 MED ORDER — EPIPEN 2-PAK 0.3 MG/0.3ML IJ SOAJ: 0.3000 mg | INTRAMUSCULAR | 2 refills | Status: DC | PRN

## 2018-02-05 MED ORDER — CETIRIZINE HCL 10 MG PO CAPS
1.0000 | ORAL_CAPSULE | Freq: Every day | ORAL | 11 refills | Status: DC
Start: 2018-02-05 — End: 2019-02-16

## 2018-02-05 NOTE — Patient Instructions (Addendum)
Pruritus (itching)    - will obtain environmental allergy testing today by serum IgE levels    - continue zyrtec 10mg  daily for control of itch    Allergic rhinitis    - as above will obtain environmental allergy testing    - continue zyrtec 10mg  daily    - for nasal congestion/drainage use Nasacort 2 sprays each nostril daily as needed.  Use for 1-2 weeks at a time before stopping once symptoms.   Food Allergy    - continue avoidance measures for peanut, tree nuts you avoid as well as peaches    - have access to self-injectable epinephrine Epipen 0.3mg  at all times    - follow emergency action plan in case of allergic reaction    - will obtain serum IgE levels to foods you are avoiding  Drug reaction     - at this time continue avoidance of the medications as at this time unsure which medication caused reaction.  Will research if any standardized testing has been done to these medications.  Graded drug challenge in-office may be an option to rule-out allergy.     Mosquito bite reaction   - Mosquito avoidance (see information below)  - Ice affected area  - Oral antihistamine (Benadryl or Zyrtec)  - Oral anti-inflammatory (ibuprofen)  - Topical corticosteroid (Hydrocortisone cream 1%)  Follow-up 4-6 months or sooner if needed   Strategies for Safer Mosquito Avoidance  by [place] are a terrible nuisance in the muggy summer months, especially now that the ferocious Asian tiger mosquito has made a permanent home here in [place]. The arrival of [place] virus has added some urgency to mosquito control measures, but spray programs and many repellents may do more harm than good in the long term. Choosing the least-toxic solutions can protect both your health and comfort in mosquito season. Here are some suggestions for safer and more effective bite avoidance this summer.   Population Control  Keeping mosquito populations in check is the most important way to avoid  bites. It's no secret that removing sources of standing water is crucial to eliminating mosquito breeding grounds. Common breeding sites to watch for include:  * Rain gutters. Clean them out and offer to do the same for elderly neighbors or others who may not be able to do the job themselves. Remember that mosquito control is a community-wide effort.  * Flowerpots, buckets and old tires. Be sure empty containers cannot hold water.  * Bird baths and pet dishes. Empty and clean them weekly.  * Recycling bins and the cans inside. These may harbor stagnant water if not emptied regularly.  * Rain barrels. Be sure they are sealed off from mosquitoes.  * Storm drains. Watch for clogs from branches and garbage.  Insecticide sprays targeting adult mosquitoes can only reduce mosquito populations for a day or two. In fact, since insecticides also kill off important mosquito predators such as dragonflies, a spray program can actually be counter-productive by leaving the rebounding mosquito population without natural enemies.  Instead, interrupt the breeding cycle by using the nontoxic bacterial larvicide Bacillus thuringiensis var. israelensis (Bti). Bti is sold in convenient donuts called "mosquito dunks" that you can safely use in your bird bath, rain barrel or low areas around your yard to kill mosquito larvae before the adults emerge and spread throughout the community, where they become much harder to kill. Bti is not harmful to fish, birds or mammals, and single applications  can remain effective for a month or more, even if the water source dries out and refills.   Safer Repellents  If you'll be outdoors at dawn or dusk when mosquitoes are most active, wear long clothes that don't leave skin exposed. (You may use insect repellent on your clothes). When you do get bites, soothe them by slathering on an astringent such as witch hazel after you come inside - it will prevent scratching and allow bites to heal  quickly.  Lately many public health officials concerned about [place] virus have been advising people to use repellents containing the pesticide DEET (N,N-diethyl-meta-toluamide). While DEET is an extremely effective mosquito repellent, it is also a neurotoxin, and studies have shown that prolonged frequent exposure can irritate skin, cause muscle twitching and weakness and harm the brain and nervous system, especially when combined with other pesticides such as permethrin.  Consumer studies report that [employer]'s Skin-So-Soft and herbal repellents containing citronella can be just as effective as DEET at repelling mosquitoes but need to be applied more often. The solution is to choose the safer formulas and reapply as needed.  General guidelines for using any insect repellent:  * Choose oils or lotions rather than sprays, which produce fine particles that are easily inhaled.  * Do not apply repellents to broken skin.  * Do not allow children to apply their own repellent, and do not apply repellents containing DEET or other pesticides directly to children's skin. If you use such products, they can be applied to children's clothing instead.  * Do not use sunscreen/repellent combinations. Sunscreen needs to be reapplied more often than repellents, so the combination products can result in overexposure to pesticides.  * Wash off all repellent from skin and clothing immediately after coming indoors.  Area-wide repellent strategies can also be effective for outdoor gatherings. There are various contraptions available that emit carbon dioxide to trap mosquitoes (such as the Mosquito Magnet and Mosquito Deleto). These are expensive, but they do work, and some companies will even rent them to you for an outdoor event. Citronella candles are also effective when there is no breeze, but beware of candles containing pesticides - the smoke is easily inhaled and can irritate the airway. Placing fans around your porch or  patio can blow mosquitoes away.  Keep in mind that only female mosquitoes actually bite and that most mosquito species in this area do not transmit West Nile virus. You are most at risk of being bitten by a mosquito carrying the disease at dawn and dusk, and even in these cases your chances of actually contracting the virus are extremely low. So take sensible steps to keep the buggers under control, but also keep them in perspective as the annoyances they are.

## 2018-02-05 NOTE — Progress Notes (Signed)
New Patient Note  RE: Rhonda Graves MRN: [id number] DOB: March 24, 1981 Date of Office Visit: 02/05/2018  Referring provider: Gildardo Pounds, NP Primary care provider: Gildardo Pounds, NP  Chief Complaint: Itching and allergies  History of present illness: Rhonda Graves is a 37 y.o. female presenting today for consultation for itching and allergies.  She is a former patient of ours last seeing Dr. Neldon Mc about 10 or so years ago.    She reports she is having daily allergy symptoms all through the year.   She states she can not make it a full day without taking zyrtec for symptoms relief.  She interested in allergen immunotherapy to not depend on zyrtec.  Her main concern is a generalized itch that is all year-round.  She states that the only thing she can think of that is causing her to itch as dust mite exposure.  With the itch she notices some redness of her palms but otherwise denies any visible rash.  She does report occasional sneezing and nasal congestion during the spring.  Zyrtec does help with her itching with taking 1 tab a day.  She states for her seasonal symptoms she will take benadryl.   She recalls on previous testing she was positive to dust mites and may be ragweed.    She is concerned for food allergy.  Walnuts she developed swelling and difficulty breathing and throat swelling and severe itching.  She had a reaction to walnuts in 2005.  She states she has similar reaction with peaches.   With peanuts she recalls having hives.  She tries to avoid stone fruits.  She does have an epipen however believes it is expired.  She can eat almonds, hazelnut, sunflower seeds without issue.  She does not like cashew but does eat pistachio without issue.   She has IBS and avoids cow's milk as she reports will make her IBS symptoms worse and drinks either soy milk or lactose-free milk.    She states she had a reaction after her C/S for failed induction of labor on 08/17/17.  She states she  developed hives, swelling of eyes and throat.  She states the procedure for the C-section went well without any problems however when she was in recovery she had a hemorrhage and was treated with hemabate, lomotil and lysteda.  She states she started itching all over and had eye swelling and she felt like her throat was starting to close.  She recalls getting benadryl and prednisone.  These the medications are not listed as an allergy for her. Per the discharge summary it was stated that the patient had "swelling of face, itching, and sensation of sunburn diffusely after csection.  We evaluated different medications from this admission vs last and they have been added as potential complications to her epic chart. She resolved with steroids/benadryl over 2 days and was d/c'd to home with pcp follow-up. Stable at discharge."   Timing of administration of medications to onset of reaction is not available in the EMR.    No history of asthma.  She does have ocasional flares of eczema.  She uses hydrocortisone as needed.    Review of systems: Review of Systems  Constitutional: Negative for chills, fever and malaise/fatigue.  HENT: Positive for congestion. Negative for ear discharge, ear pain, nosebleeds, sinus pain and sore throat.   Eyes: Negative for pain, discharge and redness.  Respiratory: Negative for cough, shortness of breath and wheezing.   Cardiovascular: Negative for chest  pain.  Gastrointestinal: Negative for abdominal pain, constipation, diarrhea, heartburn, nausea and vomiting.  Musculoskeletal: Negative for joint pain.  Skin: Positive for itching. Negative for rash.  Neurological: Negative for headaches.    All other systems negative unless noted above in HPI  Past medical history: Past Medical History:  Diagnosis Date  . Abnormal Pap smear    had colpo in past, normal pap since then  . GERD (gastroesophageal reflux disease) Dx 2010  . Headache(784.0)    migraine  . History of  oral herpes simplex infection 05/29/2017   +IgM 1/2 antibody screen done for cold sores vs aphthous ulcers  . IBS (irritable bowel syndrome)   . Multiple allergies     Past surgical history: Past Surgical History:  Procedure Laterality Date  . CESAREAN SECTION N/A 11/26/2013   Procedure: CESAREAN SECTION;  Surgeon: Lavonia Drafts, MD;  Location: [facility];  Service: Obstetrics;  Laterality: N/A;  . CESAREAN SECTION N/A 08/17/2017   Procedure: CESAREAN SECTION;  Surgeon: Jonnie Kind, MD;  Location: [facility];  Service: Obstetrics;  Laterality: N/A;    Family history:  Family History  Problem Relation Age of Onset  . Hypothyroidism Mother   . Arthritis Mother   . Asthma Brother   . Allergic rhinitis Brother     Social history: She lives in a home with her family that has carpeting with gas heating and central cooling.  She is fish in the home and a dog outside the home.  No concern for water damage, mildew or roaches in the home.  She works as a Academic librarian.  She denies any smoking history.  She is originally from [place].   Medication List: Allergies as of 02/05/2018      Reactions   Other Anaphylaxis   Walnuts   Peach [prunus Persica] Anaphylaxis   Peanut-containing Drug Products Hives   Hemabate [carboprost Tromethamine] Itching, Swelling   Had allergic reaction after Lomotil, Lysteda and Hemabate. Uncertain what caused reaction.   Lomotil [diphenoxylate] Itching, Swelling   Had allergic reaction after Lomotil, Lysteda and Hemabate. Uncertain what caused reaction.   Lysteda [tranexamic Acid] Itching, Swelling   Had allergic reaction after Lomotil, Lysteda and Hemabate. Uncertain what caused reaction.      Medication List        Accurate as of 02/05/18  4:09 PM. Always use your most recent med list.          Cetirizine HCl 10 MG Caps Commonly known as:  ZYRTEC ALLERGY Take 1 capsule (10 mg total) by mouth daily.   EPIPEN 2-PAK 0.3 mg/0.3 mL  Soaj injection Generic drug:  EPINEPHrine Inject 1 Dose into the muscle as needed for anaphylaxis.   etonogestrel-ethinyl estradiol 0.12-0.015 MG/24HR vaginal ring Commonly known as:  NUVARING Insert vaginally and leave in place for 3 consecutive weeks, then remove for 1 week.       Known medication allergies: Allergies  Allergen Reactions  . Other Anaphylaxis    Walnuts  . 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 Peanut-Containing Drug Products Hives  . Hemabate [Carboprost Tromethamine] Itching and Swelling    Had allergic reaction after Lomotil, Lysteda and Hemabate. Uncertain what caused reaction.  . Lomotil [Diphenoxylate] Itching and Swelling    Had allergic reaction after Lomotil, Lysteda and Hemabate. Uncertain what caused reaction.  Marshell Levan [Tranexamic Acid] Itching and Swelling    Had allergic reaction after Lomotil, Lysteda and Hemabate. Uncertain what caused reaction.     Physical examination: Blood  pressure 110/68, pulse 60, temperature 98.1 F (36.7 C), resp. rate 18, height 5' (1.524 m), weight 137 lb 6.4 oz (62.3 kg), SpO2 97 %, currently breastfeeding.  General: Alert, interactive, in no acute distress. HEENT: PERRLA, TMs pearly gray, turbinates moderately edematous without discharge, post-pharynx non erythematous. Neck: Supple without lymphadenopathy. Lungs: Clear to auscultation without wheezing, rhonchi or rales. {no increased work of breathing. CV: Normal S1, S2 without murmurs. Abdomen: Nondistended, nontender. Skin: Warm and dry, without lesions or rashes. Extremities:  No clubbing, cyanosis or edema. Neuro:   Grossly intact.  Diagnositics/Labs:  Allergy testing: Deferred due to recent antihistamine use  Assessment and plan:   Pruritus (itching)    - will obtain environmental allergy testing today by serum IgE levels    - continue zyrtec 10mg  daily for control of itch    -We did discuss allergen immunotherapy including benefits and risk as well  as protocol of schedule.  She is currently breast-feeding thus advised that she not start this while she is breast-feeding.  She is interested in allergen immunotherapy as she would like to not be dependent on Zyrtec to control her symptoms and she does believe that her symptoms are related to environmental exposures.  We will determine if this is the case based off of her environmental allergy testing.  I did advise that if her serum IgE levels are negative then would recommend she perform skin testing off antihistamines.  if she truly cannot come off of antihistamines for 72 hours then we can consider a quick course of prednisone to help with the pruritus.      Allergic rhinitis    - as above will obtain environmental allergy testing    - continue zyrtec 10mg  daily    - for nasal congestion/drainage use Nasacort 2 sprays each nostril daily as needed.  Use for 1-2 weeks at a time before stopping once symptoms.     -Discussed allergen immunotherapy as above  Food Allergy    - continue avoidance measures for peanut, tree nuts you avoid as well as peaches    - have access to self-injectable epinephrine Epipen 0.3mg  at all times    - follow emergency action plan in case of allergic reaction    - will obtain serum IgE levels to foods you are avoiding  Drug reaction     - at this time continue avoidance of these (hemabate, lomotil and lysteda) medications as at this time unsure which medication is the culprit.  At this time I do not believe there are any standardized testing or any reports of testing done to these medications.  We will do some further research to determine if there are any new reports of allergy to these medications and any possible testing.  Testing may including skin prick/intradermal testing and/or graded drug challenge in-office may be an option to rule-out allergy to these medications.     Mosquito bite reaction   - Mosquito avoidance measures provided  - Ice affected area  -  Oral antihistamine (Benadryl or Zyrtec)  - Oral anti-inflammatory (ibuprofen)  - Topical corticosteroid (Hydrocortisone cream 1%)  Follow-up 4-6 months or sooner if needed  I appreciate the opportunity to take part in Orissa's care. Please do not hesitate to contact me with questions.  Sincerely,   Prudy Feeler, MD Allergy/Immunology [facility]

## 2018-02-14 LAB — ALLERGENS(7)
F020-IgE Almond: 0.58 kU/L — AB
F202-IgE Cashew Nut: <0.1 kU/L
HAZELNUT (FILBERT) IGE: 1.3 kU/L — AB
Peanut IgE: 1.65 kU/L — AB
Pecan Nut IgE: 0.23 kU/L — AB
WALNUT IGE: 1.56 kU/L — AB

## 2018-02-14 LAB — ALLERGENS W/TOTAL IGE AREA 2
Aspergillus Fumigatus IgE: <0.1 kU/L
Bermuda Grass IgE: 0.1 kU/L — AB
Cedar, Mountain IgE: 0.11 kU/L — AB
Cladosporium Herbarum IgE: <0.1 kU/L
Cockroach, German IgE: <0.1 kU/L
Common Silver Birch IgE: <0.1 kU/L
Cottonwood IgE: <0.1 kU/L
D Pteronyssinus IgE: <0.1 kU/L
Dog Dander IgE: <0.1 kU/L
IgE (Immunoglobulin E), Serum: 16 IU/mL (ref 6–495)
Penicillium Chrysogen IgE: <0.1 kU/L
Ragweed, Short IgE: <0.1 kU/L
SHEEP SORREL IGE QN: 0.2 kU/L — AB
T001-IGE MAPLE/BOX ELDER: 0.35 kU/L — AB
T008-IGE ELM, AMERICAN: 0.38 kU/L — AB
White Mulberry IgE: <0.1 kU/L

## 2018-02-14 LAB — ALLERGEN MILK

## 2018-02-14 LAB — ALLERGEN PEACH F95: Allergen, Peach f95: 4.6 kU/L — AB

## 2018-02-23 MED FILL — **NUVARING VAGINAL RING: 0.12-0.015 | 28 days supply | Qty: 1 | Fill #2

## 2018-02-23 MED FILL — ?CETIRIZINE HCL 10 MG TABLE: 10 | 30 days supply | Qty: 30 | Fill #0

## 2018-02-23 MED FILL — traZODone HCL 50 MG TABS: 50 | 30 days supply | Qty: 30 | Fill #1

## 2018-02-27 ENCOUNTER — Ambulatory Visit: Payer: Self-pay | Admitting: Allergy

## 2018-03-30 MED FILL — ?CETIRIZINE HCL 10 MG TABLE: 10 | 30 days supply | Qty: 30 | Fill #1

## 2018-03-30 MED FILL — traZODone HCL 50 MG TABS: 50 | 60 days supply | Qty: 60 | Fill #2

## 2018-03-30 MED FILL — **NUVARING VAGINAL RING: 0.12-0.015 | 56 days supply | Qty: 2 | Fill #3

## 2018-04-08 ENCOUNTER — Ambulatory Visit: Payer: Self-pay

## 2018-04-09 ENCOUNTER — Ambulatory Visit: Payer: Self-pay | Admitting: Allergy
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
## 2018-05-19 MED FILL — **NUVARING VAGINAL RING: 0.12-0.015 | 28 days supply | Qty: 1 | Fill #4

## 2018-06-18 ENCOUNTER — Other Ambulatory Visit: Payer: Self-pay | Admitting: Obstetrics & Gynecology

## 2018-06-18 DIAGNOSIS — Z3009 Encounter for other general counseling and advice on contraception: Secondary | ICD-10-CM

## 2018-06-18 MED ORDER — ETONOGESTREL-ETHINYL ESTRADIOL 0.12-0.015 MG/24HR VA RING: VAGINAL_RING | VAGINAL | 12 refills | Status: DC

## 2018-06-18 MED ORDER — NORETHINDRONE 0.35 MG PO TABS: 1.0000 | ORAL_TABLET | Freq: Every day | ORAL | 3 refills | Status: DC

## 2018-06-18 MED FILL — NORETHINDRONE 0.35 MG TAB: 0.35 | 28 days supply | Qty: 28 | Fill #0

## 2018-06-23 ENCOUNTER — Other Ambulatory Visit: Payer: Self-pay | Admitting: Advanced Practice Midwife

## 2018-06-23 MED ORDER — DROSPIRENONE 4 MG PO TABS: 1.0000 | ORAL_TABLET | Freq: Every day | ORAL | 11 refills | Status: DC

## 2018-06-23 NOTE — Progress Notes (Signed)
Rhonda Graves is a 37 y.o. G2P2002 calling to change her birth control. She has used POPs, but then switched to nuva ring. She states that she is still breastfeeding and had decrease milk supply with nuva ring and her b/p increased. She went back to micronor, but has been late taking her pills. She is now having some irregular bleeding. Will try to switch her to [facility] a new POP that has more flexible dosing. RX sent to [facility].  Marcille Buffy 11:56 AM 06/23/18

## 2018-07-10 ENCOUNTER — Ambulatory Visit: Payer: Self-pay | Admitting: Allergy

## 2018-07-17 ENCOUNTER — Ambulatory Visit: Payer: Self-pay | Attending: Family Medicine

## 2018-07-23 ENCOUNTER — Ambulatory Visit: Payer: Self-pay

## 2018-07-31 ENCOUNTER — Other Ambulatory Visit: Payer: Self-pay | Admitting: Nurse Practitioner

## 2018-07-31 DIAGNOSIS — G4709 Other insomnia: Secondary | ICD-10-CM

## 2018-07-31 MED FILL — ?CETIRIZINE HCL 10 MG TABLE: 10 | 30 days supply | Qty: 30 | Fill #2

## 2018-08-03 ENCOUNTER — Ambulatory Visit: Payer: Self-pay | Admitting: Podiatry

## 2018-08-04 ENCOUNTER — Telehealth: Payer: Self-pay | Admitting: Nurse Practitioner

## 2018-08-04 ENCOUNTER — Ambulatory Visit: Payer: Self-pay | Admitting: Nurse Practitioner

## 2018-08-04 MED FILL — ?TRAZODONE HCL 50MG TABS: 50 | 30 days supply | Qty: 30 | Fill #0

## 2018-08-04 NOTE — Telephone Encounter (Signed)
Patient came in to get a copy of her [place] letter. Please follow up with patient.

## 2018-08-04 NOTE — Telephone Encounter (Signed)
LVM to inform them that the letter will be mail today

## 2018-08-12 ENCOUNTER — Ambulatory Visit: Payer: Self-pay | Admitting: Podiatry

## 2018-08-17 ENCOUNTER — Ambulatory Visit: Payer: No Typology Code available for payment source

## 2018-08-17 ENCOUNTER — Other Ambulatory Visit: Payer: Self-pay | Admitting: Podiatry

## 2018-08-17 ENCOUNTER — Encounter: Payer: Self-pay | Admitting: Podiatry

## 2018-08-17 ENCOUNTER — Ambulatory Visit: Payer: No Typology Code available for payment source | Admitting: Podiatry

## 2018-08-17 VITALS — BP 130/96 | HR 54

## 2018-08-17 DIAGNOSIS — M779 Enthesopathy, unspecified: Principal | ICD-10-CM

## 2018-08-17 DIAGNOSIS — M778 Other enthesopathies, not elsewhere classified: Secondary | ICD-10-CM

## 2018-08-17 DIAGNOSIS — M79671 Pain in right foot: Secondary | ICD-10-CM

## 2018-08-17 DIAGNOSIS — M7751 Other enthesopathy of right foot: Secondary | ICD-10-CM

## 2018-08-17 DIAGNOSIS — M79672 Pain in left foot: Secondary | ICD-10-CM

## 2018-08-17 NOTE — Patient Instructions (Signed)
Insoles at [employer]

## 2018-08-18 NOTE — Progress Notes (Signed)
   HPI: 37 year old female presenting today with a chief complaint of bilateral foot pain that has been present for the past few years. She states she was seen by Dr. Paulla Dolly in the past and received injections for treatment which helped temporarily. She states the pain has now returned and is worsening. There are no modifying factors noted. She has had no recent treatment. Patient is here for further evaluation and treatment.   Past Medical History:  Diagnosis Date  . Abnormal Pap smear    had colpo in past, normal pap since then  . GERD (gastroesophageal reflux disease) Dx 2010  . Headache(784.0)    migraine  . History of oral herpes simplex infection 05/29/2017   +IgM 1/2 antibody screen done for cold sores vs aphthous ulcers  . IBS (irritable bowel syndrome)   . Multiple allergies      Physical Exam: General: The patient is alert and oriented x3 in no acute distress.  Dermatology: Skin is warm, dry and supple bilateral lower extremities. Negative for open lesions or macerations.  Vascular: Palpable pedal pulses bilaterally. No edema or erythema noted. Capillary refill within normal limits.  Neurological: Epicritic and protective threshold grossly intact bilaterally.   Musculoskeletal Exam: Range of motion within normal limits to all pedal and ankle joints bilateral. Muscle strength 5/5 in all groups bilateral.   Radiographic Exam:  Normal osseous mineralization. Joint spaces preserved. No fracture/dislocation/boney destruction.    Assessment: 1. Generalized foot pain bilateral - currently asymptomatic    Plan of Care:  1. Patient evaluated. X-Rays reviewed.  2. Recommended OTC insoles from [employer].  3. Recommended good shoe gear.  4. Return to clinic as needed.      Edrick Kins, DPM [facility]  Dr. Edrick Kins, DPM    [address], [place] [address]                Office [phone]  Fax  [phone]

## 2018-08-27 ENCOUNTER — Ambulatory Visit (INDEPENDENT_AMBULATORY_CARE_PROVIDER_SITE_OTHER): Payer: Self-pay

## 2018-08-27 DIAGNOSIS — Z3202 Encounter for pregnancy test, result negative: Secondary | ICD-10-CM

## 2018-08-27 LAB — POCT PREGNANCY, URINE: Preg Test, Ur: NEGATIVE

## 2018-08-27 NOTE — Progress Notes (Signed)
Pt here for UPT-Negative,Pt verbalized understanding.  Olin Hauser [facility] 08/27/18 @ 10:10AM

## 2018-09-02 NOTE — Progress Notes (Signed)
I have reviewed this chart and agree with the RN/CMA assessment and management.    Dougles Kimmey C Zymeir Salminen, MD, FACOG Attending Physician, [facility]  

## 2018-09-10 IMAGING — US US MFM OB FOLLOW-UP
1 series · 14 of 28 positions shown · non-contrast
Comparison: none

[Series 1: us mfm ob follow-up · 28 acquisitions, 14 frames shown]
[im 2/28]
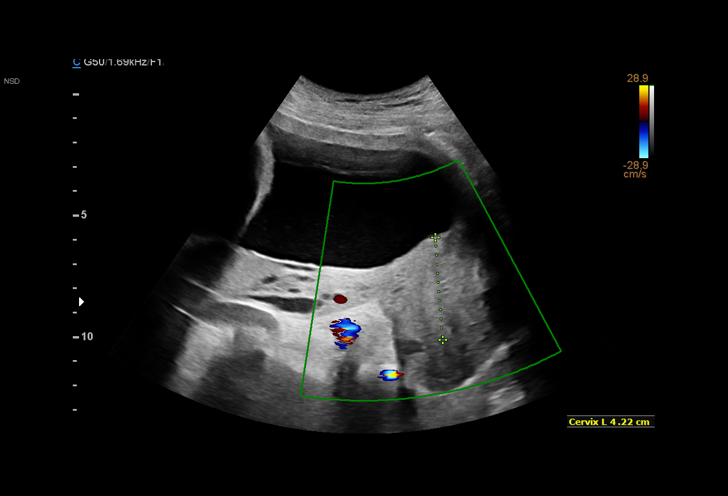
[im 4/28]
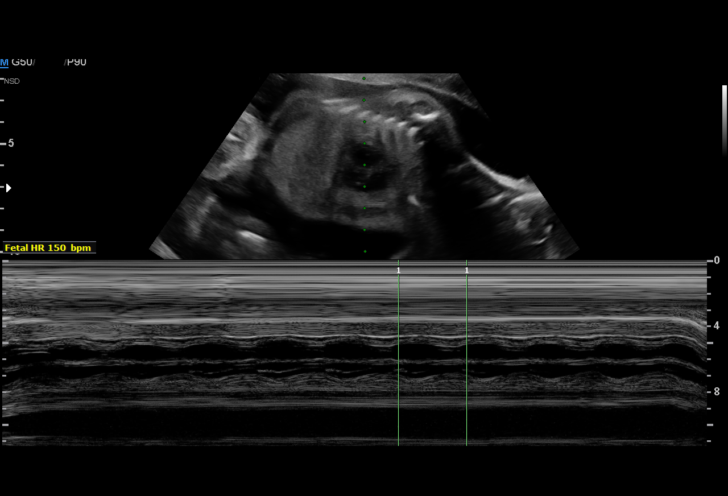
[im 6/28]
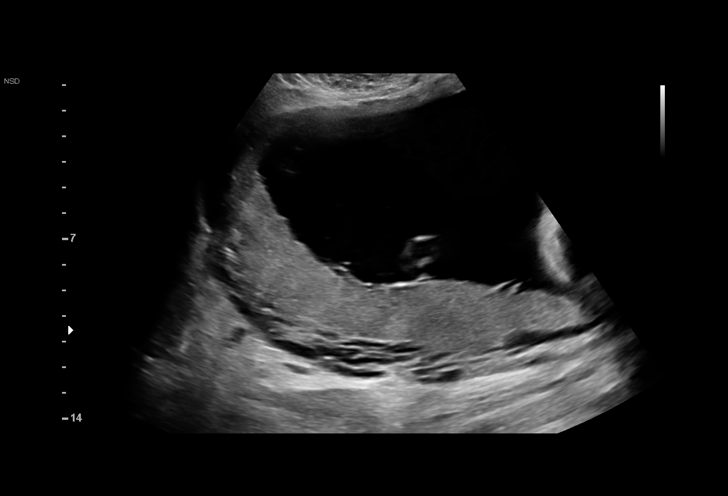
[im 8/28]
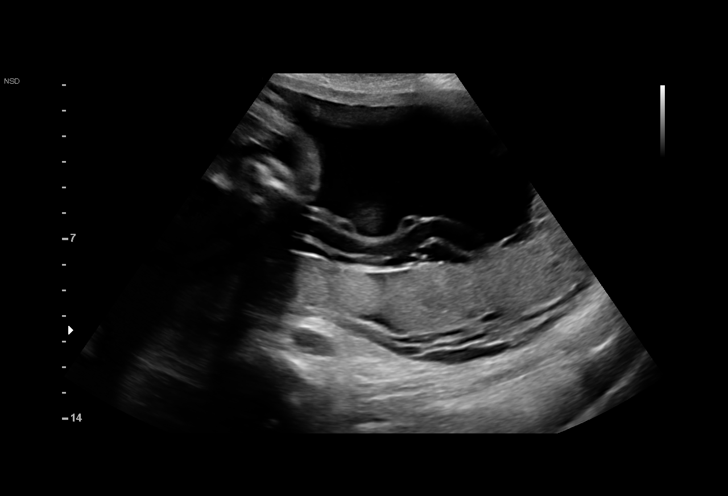
[im 10/28]
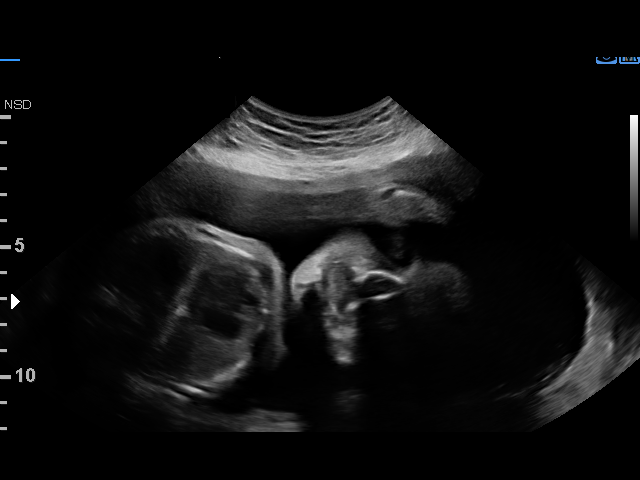
[im 12/28]
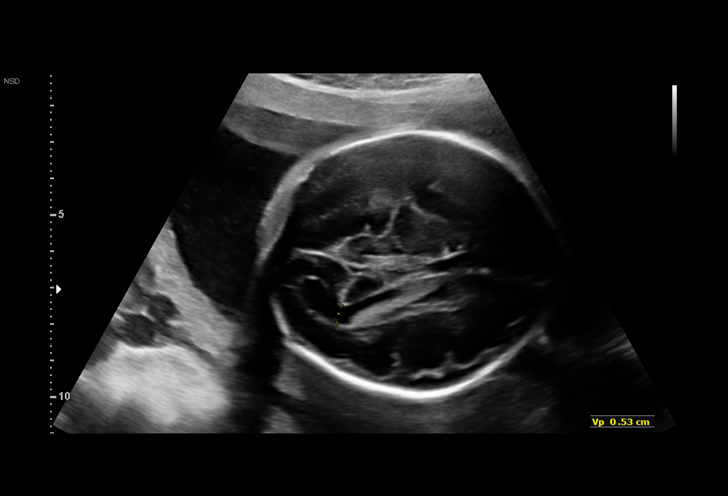
[im 14/28]
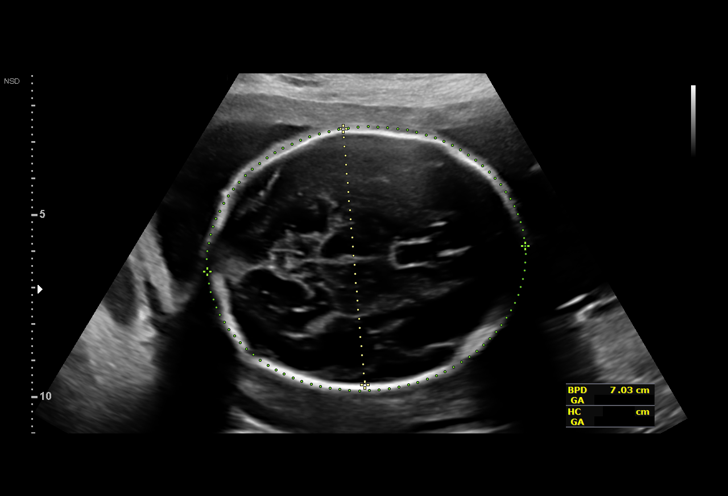
[im 16/28]
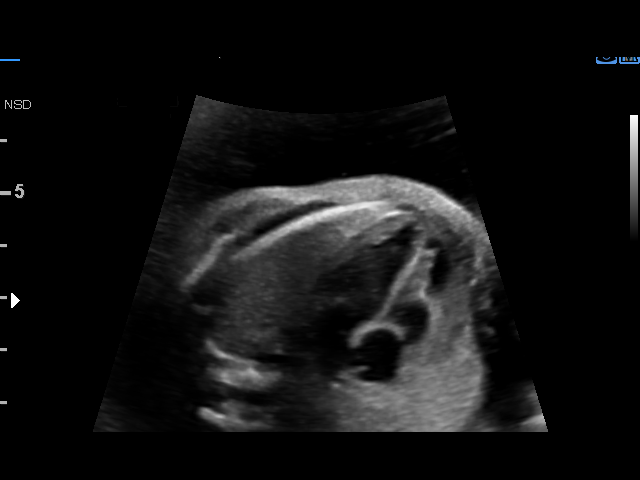
[im 18/28]
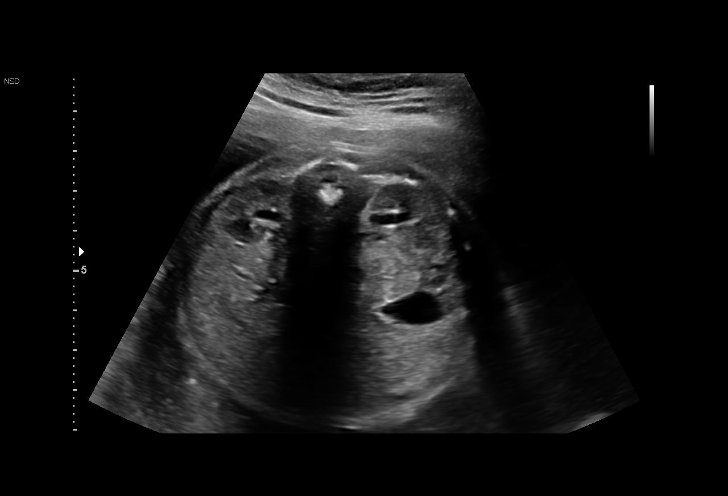
[im 20/28]
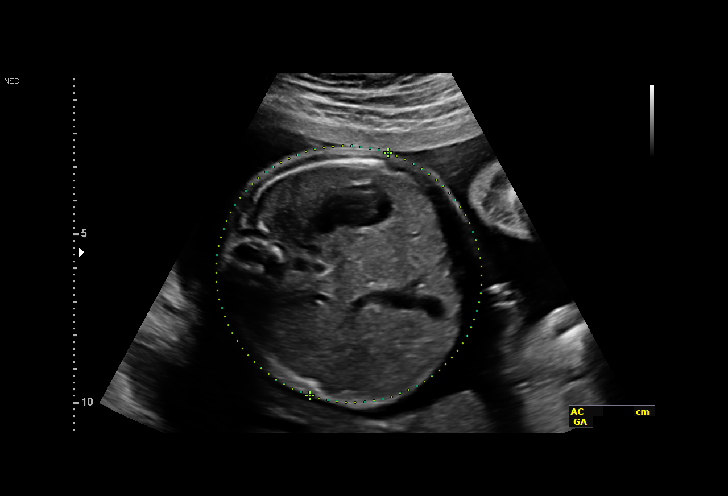
[im 22/28]
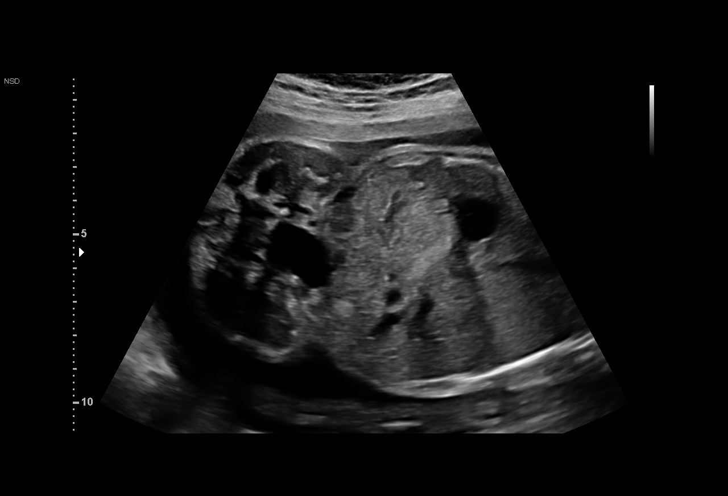
[im 24/28]
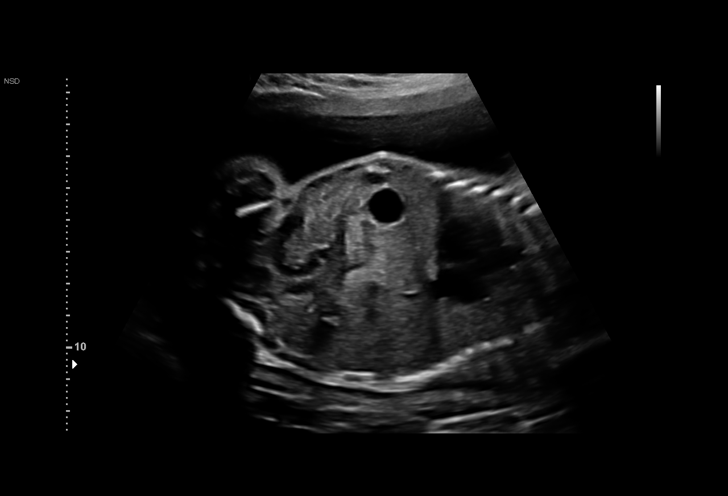
[im 26/28]
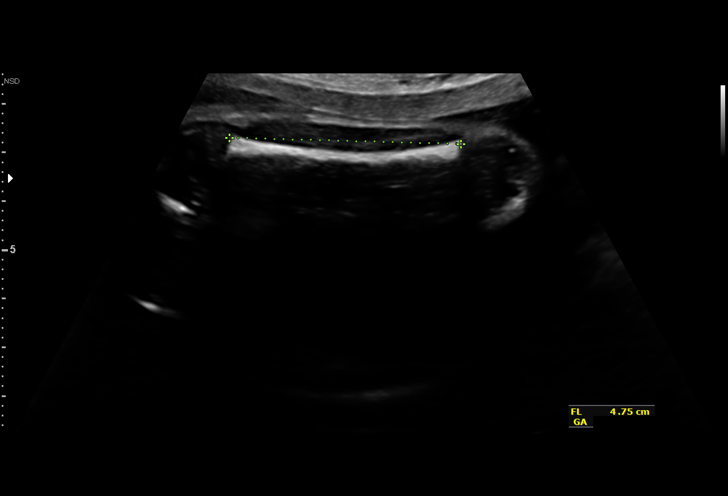
[im 28/28]
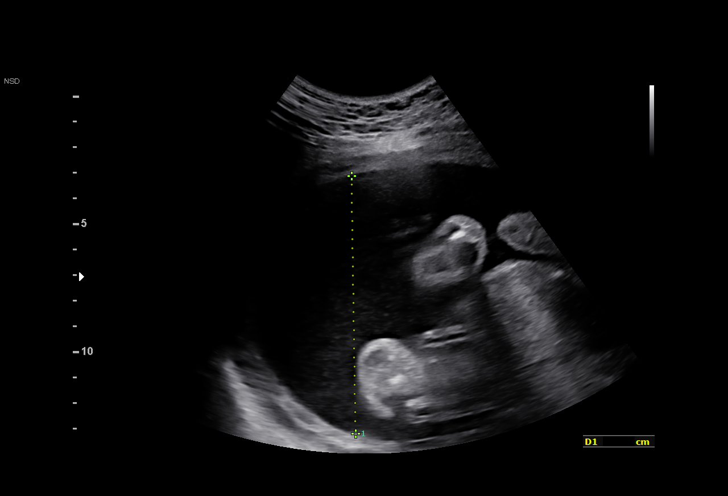

[14 of 28 positions shown; findings below may reference images not displayed]

OB/Gyn Clinic

1  LKENNA GOMBA            [id number]      [id number]     [id number]
Indications

26 weeks gestation of pregnancy
Hypertension - Chronic/Pre-existing
History of cesarean delivery, currently
pregnant
Pregnancy resulting from assisted
reproductive technology (IUI)
Advanced maternal age multigravida 35+,
first trimester (36 at delivery)
Antenatal follow-up for nonvisualized fetal
anatomy
OB History

Blood Type:            Height:  5'1"   Weight (lb):  137      BMI:
Gravidity:    2         Term:   1        Prem:   0        SAB:   0
TOP:          0       Ectopic:  0        Living: 1
Fetal Evaluation

Num Of Fetuses:     1
Fetal Heart         150
Rate(bpm):
Cardiac Activity:   Observed
Presentation:       Cephalic
Placenta:           Posterior, above cervical os
P. Cord Insertion:  Previously Visualized

Amniotic Fluid
AFI FV:      Polyhydramnios

Largest Pocket(cm)
10.1
Biometry

BPD:      70.3  mm     G. Age:  28w 2d         82  %    CI:         76.1   %   70 - 86
FL/HC:      18.7   %   18.6 -
HC:      255.4  mm     G. Age:  27w 5d         52  %    HC/AC:      1.05       1.05 -
AC:      242.8  mm     G. Age:  28w 4d         87  %    FL/BPD:     67.9   %   71 - 87
FL:       47.7  mm     G. Age:  26w 0d         14  %    FL/AC:      19.6   %   20 - 24

Est. FW:    5049  gm      2 lb 7 oz     68  %
Gestational Age

LMP:           26w 6d       Date:   11/16/16                 EDD:   08/23/17
U/S Today:     27w 5d                                        EDD:   08/17/17
Best:          26w 6d    Det. By:   LMP  (11/16/16)          EDD:   08/23/17
Anatomy

Cranium:               Appears normal         Aortic Arch:            Previously seen
Cavum:                 Previously seen        Ductal Arch:            Previously seen
Ventricles:            Appears normal         Diaphragm:              Appears normal
Choroid Plexus:        Previously seen        Stomach:                Appears normal, left
sided
Cerebellum:            Previously seen        Abdomen:                Appears normal
Posterior Fossa:       Previously seen        Abdominal Wall:         Previously seen
Nuchal Fold:           Previously seen        Cord Vessels:           Previously seen
Face:                  Orbits and profile     Kidneys:                Appear normal
previously seen
Lips:                  Previously seen        Bladder:                Appears normal
Thoracic:              Appears normal         Spine:                  Previously seen
Heart:                 Appears normal         Upper Extremities:      Previously seen
(4CH, axis, and situs
RVOT:                  Previously seen        Lower Extremities:      Previously seen
LVOT:                  Previously seen

Other:  Female gender previously seen. Heels and LT 5th digit previously
visualized.
Cervix Uterus Adnexa

Cervix
Length:            4.2  cm.
Normal appearance by transabdominal scan.

Left Ovary
Within normal limits.

Right Ovary
Within normal limits.

Adnexa:       No abnormality visualized.
Impression

Single IUP at 26w 6d
Chronic hypertension, advanced maternal age
No defects demonstrated on interval anatomy.
Fetal growth is appropriate (68th %tile)
Posterior placenta without previa
Polyhydramnios
cervix remains long and closed, conferring low risk for
spontaneous preterm delivery
Recommendations

1. interval growth in 4 weeks
2. begin antenatal testing at 32 weeks
3. given CHTN, delivery 38-39 weeks provided no indication
arises in the interim.

## 2018-09-14 ENCOUNTER — Telehealth: Payer: Self-pay | Admitting: *Deleted

## 2018-09-14 DIAGNOSIS — B379 Candidiasis, unspecified: Secondary | ICD-10-CM

## 2018-09-14 MED ORDER — FLUCONAZOLE 150 MG PO TABS: 150.0000 mg | ORAL_TABLET | Freq: Once | ORAL | 0 refills | Status: AC

## 2018-09-14 MED FILL — ?TRAZODONE HCL 50MG TABS: 50 | 30 days supply | Qty: 30 | Fill #1

## 2018-09-14 MED FILL — ?CETIRIZINE HCL 10 MG TABLE: 10 | 30 days supply | Qty: 30 | Fill #3

## 2018-09-14 MED FILL — FLUCONAZOLE 150 MG TABS: 150 | 1 days supply | Qty: 1 | Fill #0

## 2018-09-14 NOTE — Telephone Encounter (Signed)
Pt reports that she has a yeast infection and would like a prescription for Diflucan which she has taken in the past and it has worked. Pt reports her symptoms are white discharge and itching.  Discussed with Marcille Buffy CNM and Diflucan 150mg  PO 1 dose sent to the [facility]. Pt verbalized understanding.

## 2018-10-02 ENCOUNTER — Ambulatory Visit (INDEPENDENT_AMBULATORY_CARE_PROVIDER_SITE_OTHER): Payer: Self-pay | Admitting: *Deleted

## 2018-10-02 DIAGNOSIS — N898 Other specified noninflammatory disorders of vagina: Secondary | ICD-10-CM

## 2018-10-02 MED ORDER — FLUCONAZOLE 150 MG PO TABS: 150.0000 mg | ORAL_TABLET | Freq: Once | ORAL | 0 refills | Status: AC

## 2018-10-02 MED FILL — FLUCONAZOLE 150 MG TABS: 150 | 1 days supply | Qty: 1 | Fill #0

## 2018-10-02 NOTE — Progress Notes (Signed)
Here with c/o vaginal itching and vaginal discharge. Would like to do self swab to check for yeast. Also is being treated for yeast on her nipples - baby has thrush and is breastfeeding. Took diflucan once 2 weeks ago. Would like to repeat diflucan once today. Order sent.

## 2018-10-02 NOTE — Progress Notes (Signed)
Agree with A & P. 

## 2018-10-05 LAB — CERVICOVAGINAL ANCILLARY ONLY
Bacterial vaginitis: NEGATIVE
CANDIDA VAGINITIS: NEGATIVE

## 2018-10-06 ENCOUNTER — Other Ambulatory Visit: Payer: Self-pay

## 2018-10-06 ENCOUNTER — Other Ambulatory Visit: Payer: Self-pay | Admitting: Student

## 2018-10-06 MED ORDER — FLUCONAZOLE 200 MG PO TABS: 400.0000 mg | ORAL_TABLET | Freq: Once | ORAL | 0 refills | Status: AC

## 2018-10-06 MED ORDER — FLUCONAZOLE 150 MG PO TABS: 150.0000 mg | ORAL_TABLET | ORAL | 1 refills | Status: AC

## 2018-10-06 MED ORDER — FLUCONAZOLE 200 MG PO TABS: 200.0000 mg | ORAL_TABLET | Freq: Every day | ORAL | 0 refills | Status: DC

## 2018-10-06 NOTE — Telephone Encounter (Signed)
Per Sherian Maroon, pt needs to tx for thrush due to baby having thrush and pt c/o pain in the breast.  Per Dr. Danley Danker pt can take Diflucan 400 mg po x 1 day then 200 mg x 14 days.

## 2018-10-07 MED FILL — FLUCONAZOLE 200 MG TABLET: 200 | 1 days supply | Qty: 2 | Fill #0

## 2018-10-07 MED FILL — FLUCONAZOLE 200 MG TABLET: 200 | 14 days supply | Qty: 14 | Fill #0

## 2018-10-20 IMAGING — US US FETAL BPP W/ NON-STRESS
1 series · 13 of 14 positions shown · non-contrast
Comparison: none

[Series 1: us fetal bpp w/nonstress · 14 acquisitions, 13 frames shown]
[im 1/14]
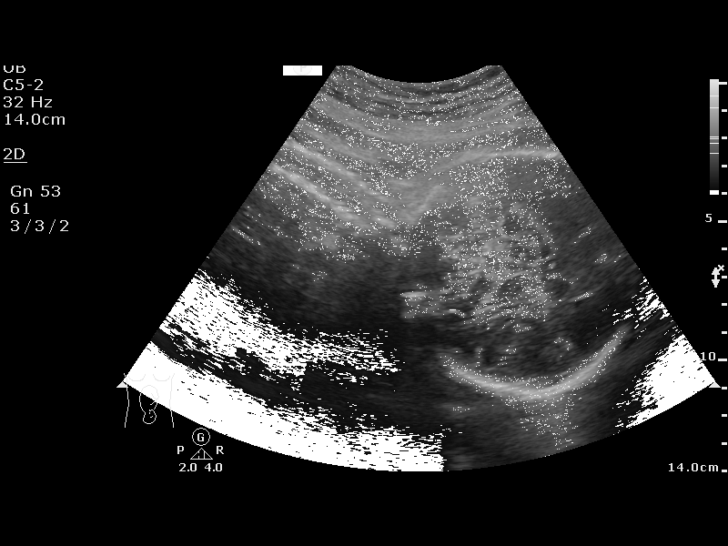
[im 2/14]
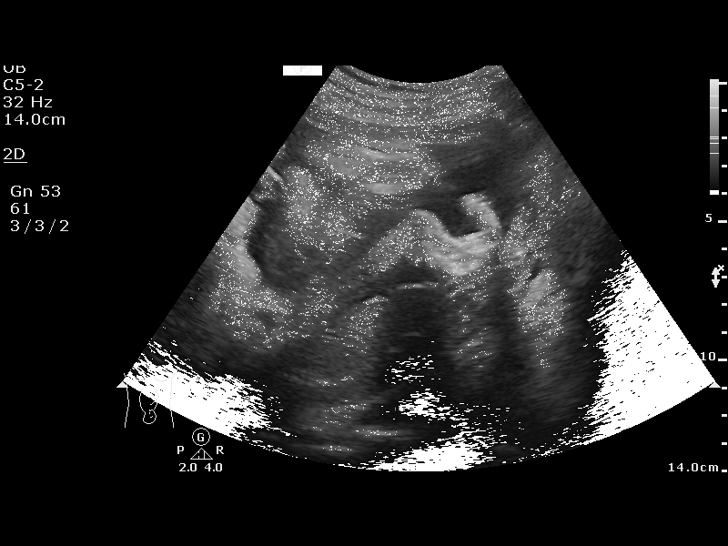
[im 3/14]
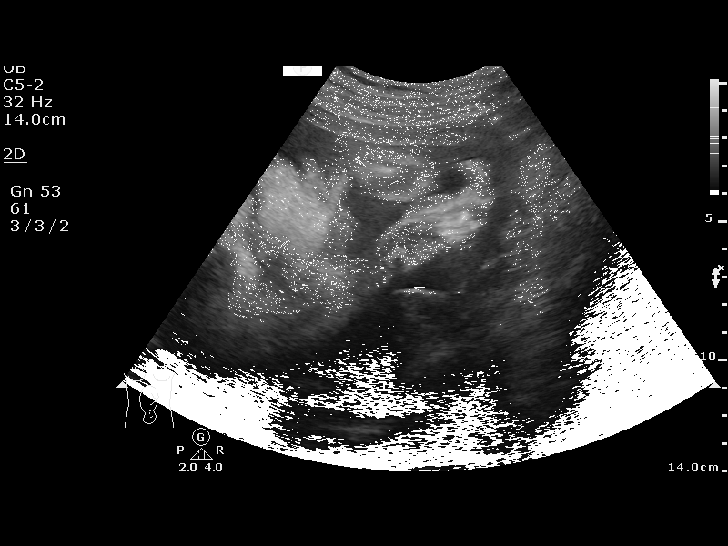
[im 4/14]
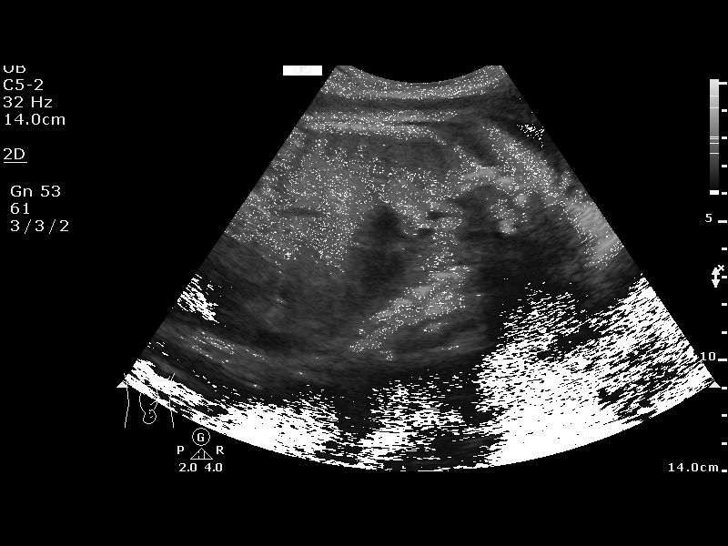
[im 5/14]
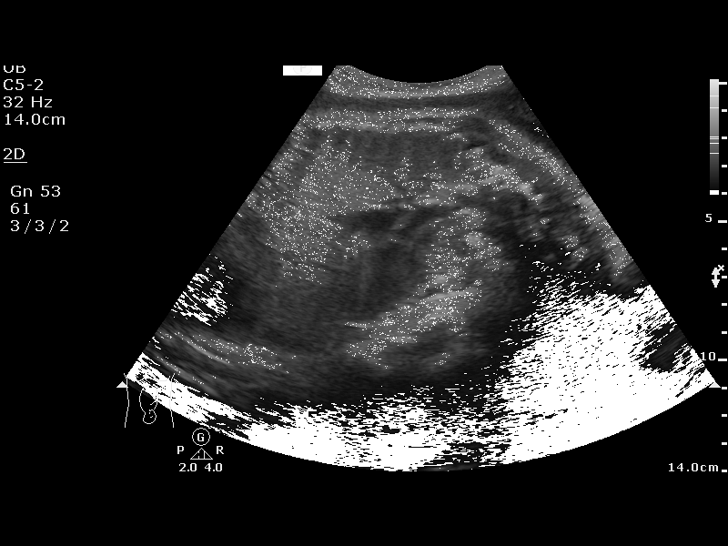
[im 6/14]
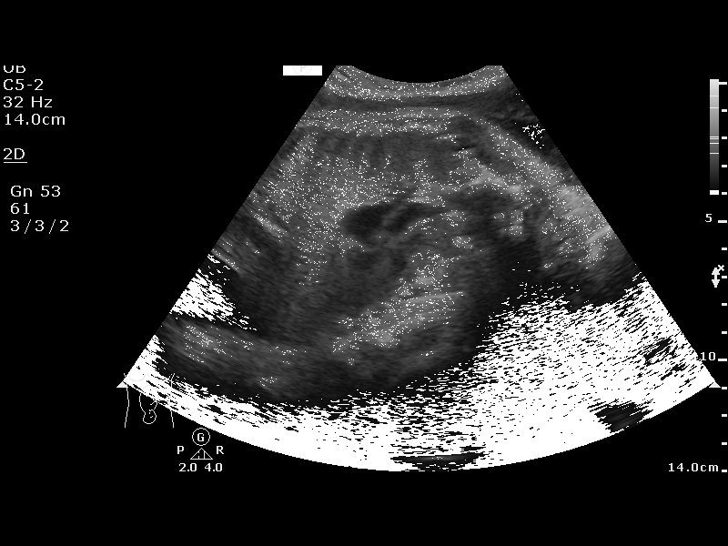
[im 8/14]
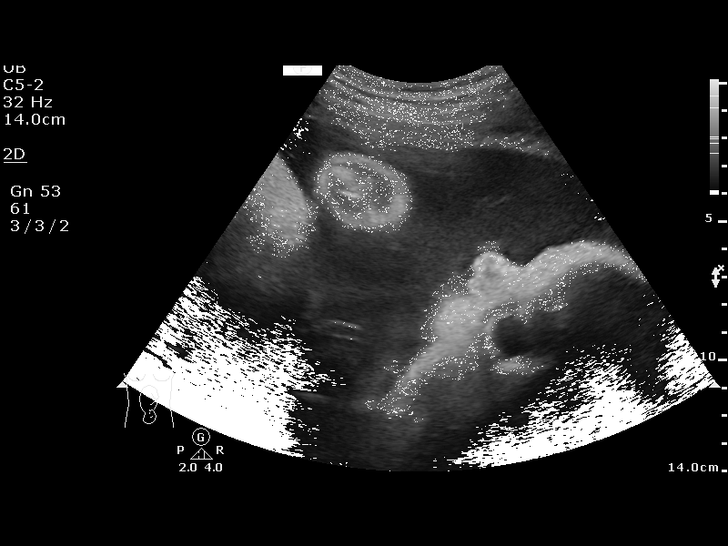
[im 9/14]
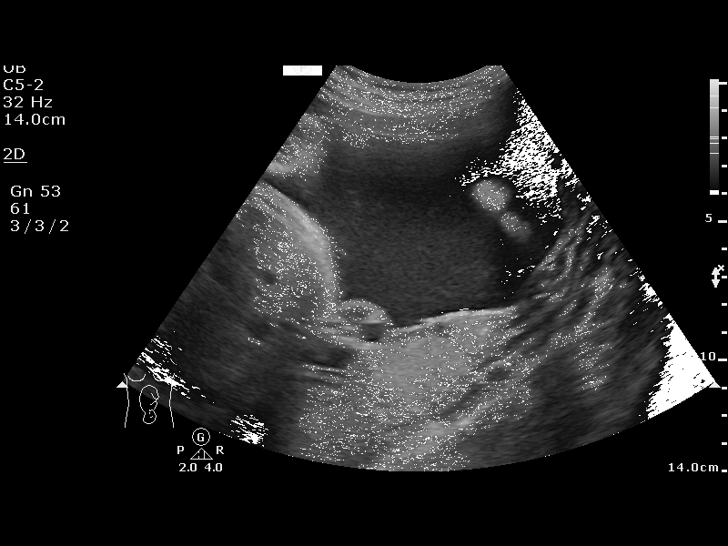
[im 10/14]
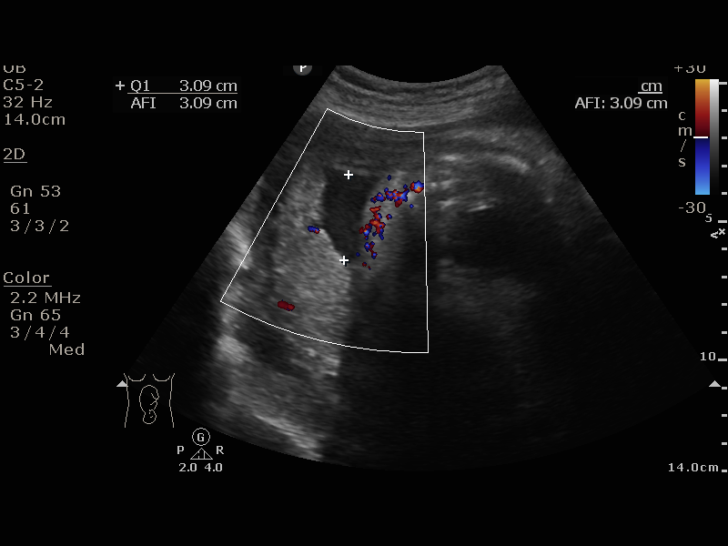
[im 11/14]
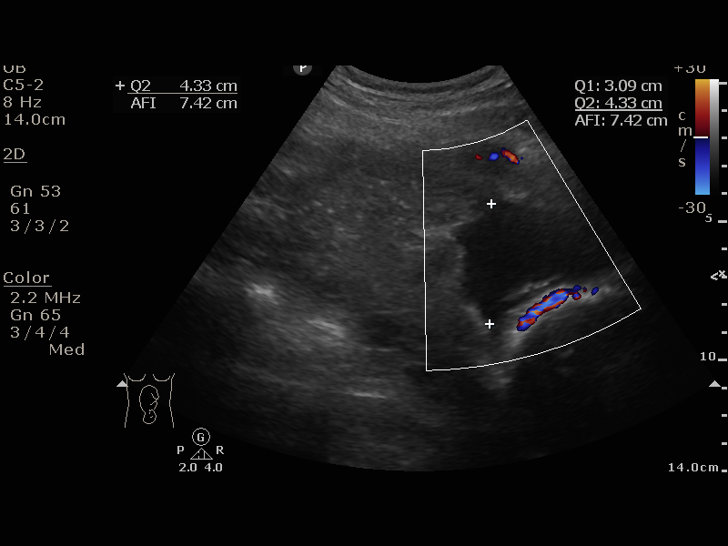
[im 12/14]
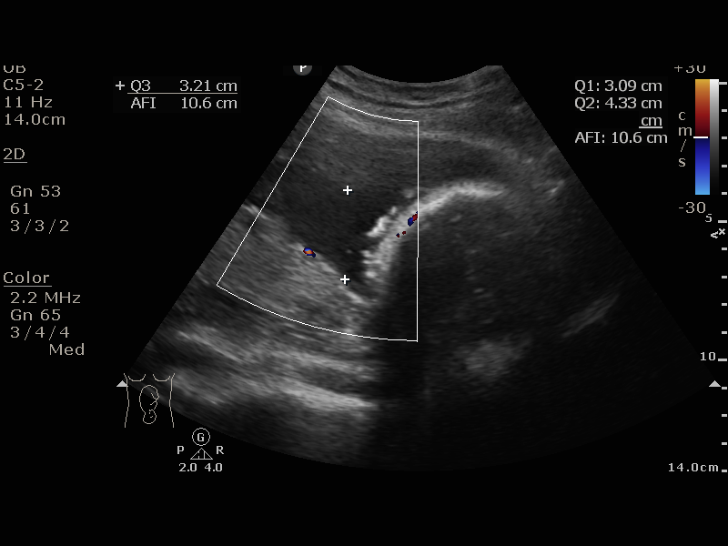
[im 13/14]
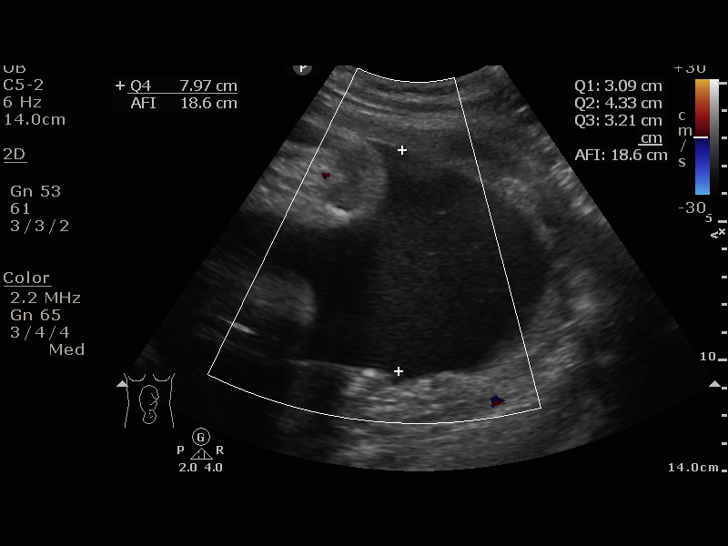
[im 14/14]
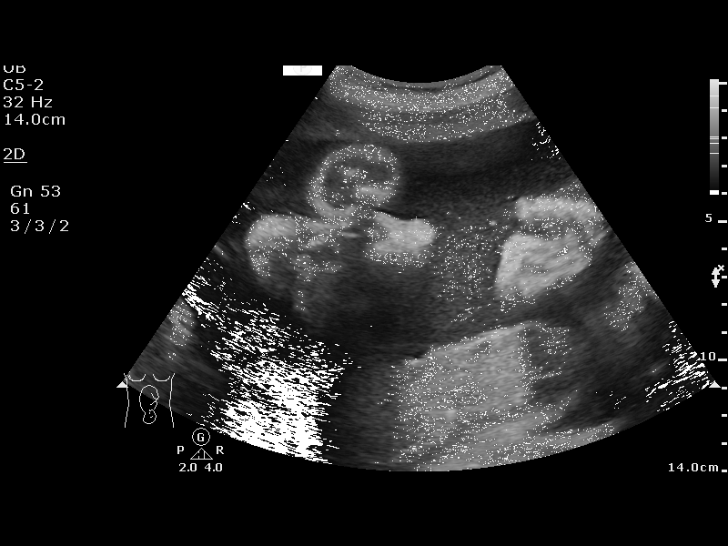

[13 of 14 positions shown; findings below may reference images not displayed]

Attending:        Che Wah Billie         Address:          [HOSPITAL]
[facility]
[REDACTED]

1  US FETAL BPP W/NONSTRESS                    76818.4

1  DONG MYUNG PURBRICK           [id number]      [id number]     [id number]
Service(s) Provided

Indications

32 weeks gestation of pregnancy
Unspecified pre-existing hypertension
complicating pregnancy, unspecified
trimester
OB History

Blood Type:            Height:  5'1"   Weight (lb):  137       BMI:
Gravidity:    2         Term:   1        Prem:   0        SAB:   0
TOP:          0       Ectopic:  0        Living: 1
Fetal Evaluation

Num Of Fetuses:     1
Preg. Location:     Intrauterine
Cardiac Activity:   Observed
Presentation:       Cephalic
AFI Sum(cm)     %Tile       Largest Pocket(cm)
18.6            69

RUQ(cm)       RLQ(cm)       LUQ(cm)        LLQ(cm)
3.09
Biophysical Evaluation

Amniotic F.V:   Pocket => 2 cm two         F. Tone:        Observed
planes
F. Movement:    Observed                   N.S.T:          Reactive
F. Breathing:   Observed                   Score:          [DATE]
Gestational Age

LMP:           32w 4d        Date:  11/16/16                 EDD:   08/23/17
Best:          32w 4d     Det. By:  LMP  (11/16/16)          EDD:   08/23/17
Impression

Normal amniotic fluid volume. BPP [DATE].
Recommendations

Continue antenatal testing as recommended.

## 2018-10-26 ENCOUNTER — Other Ambulatory Visit: Payer: Self-pay | Admitting: Obstetrics & Gynecology

## 2018-10-26 DIAGNOSIS — B001 Herpesviral vesicular dermatitis: Secondary | ICD-10-CM

## 2018-10-26 MED ORDER — VALACYCLOVIR HCL 1 G PO TABS: 2000.0000 mg | ORAL_TABLET | Freq: Two times a day (BID) | ORAL | 0 refills | Status: DC

## 2018-10-26 MED FILL — ?VALACYCLOVIR HCL 1 GRAM TA: 1 | 5 days supply | Qty: 20 | Fill #0

## 2018-10-26 NOTE — Progress Notes (Signed)
Acyclovir for cold sore

## 2018-10-27 IMAGING — US US FETAL BPP W/ NON-STRESS
1 series · 13 of 13 positions shown · non-contrast
Comparison: none

[Series 1: us fetal bpp w/nonstress · 13 acquisitions, 13 frames shown]
[im 1/13]
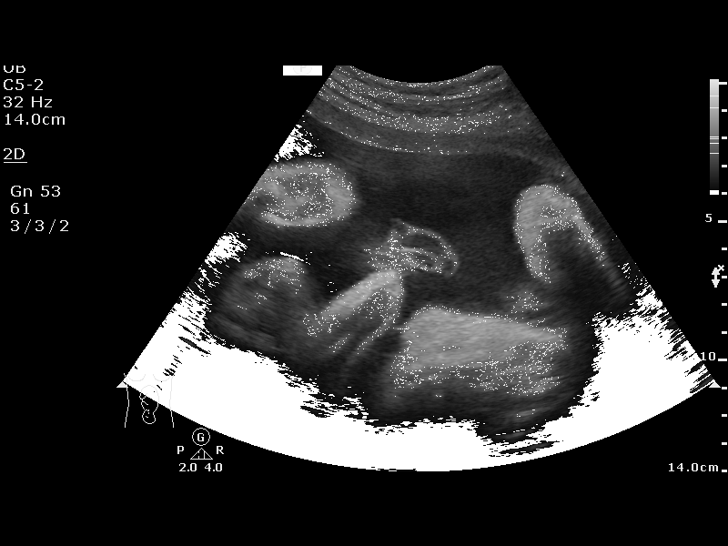
[im 2/13]
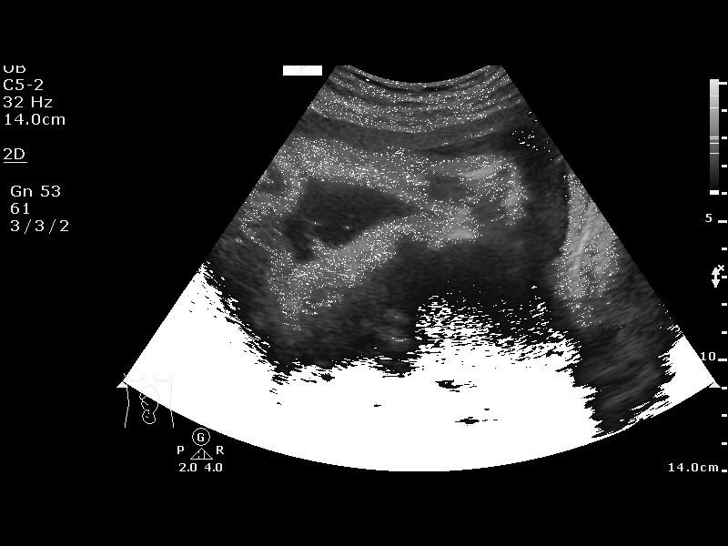
[im 3/13]
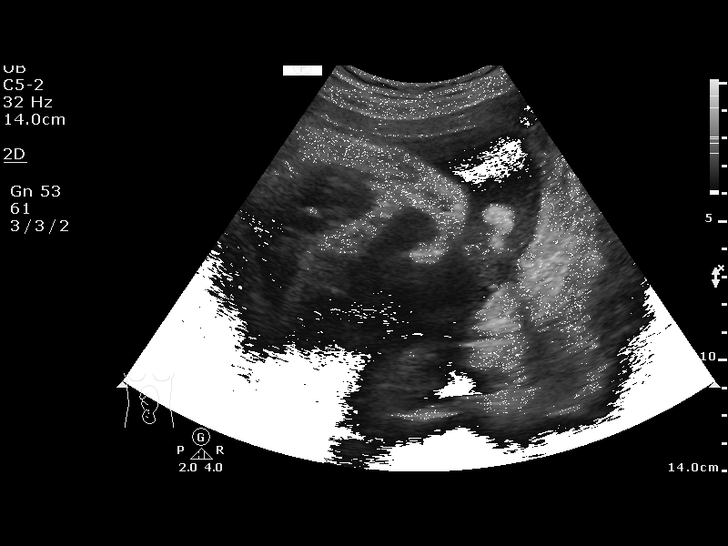
[im 4/13]
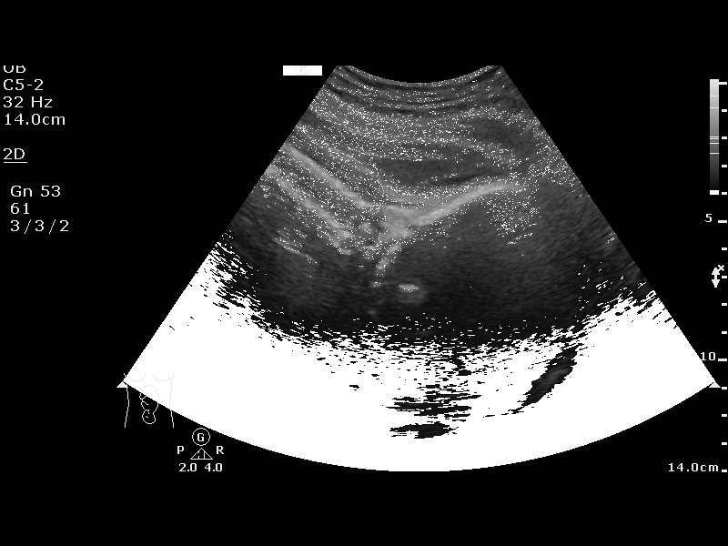
[im 5/13]
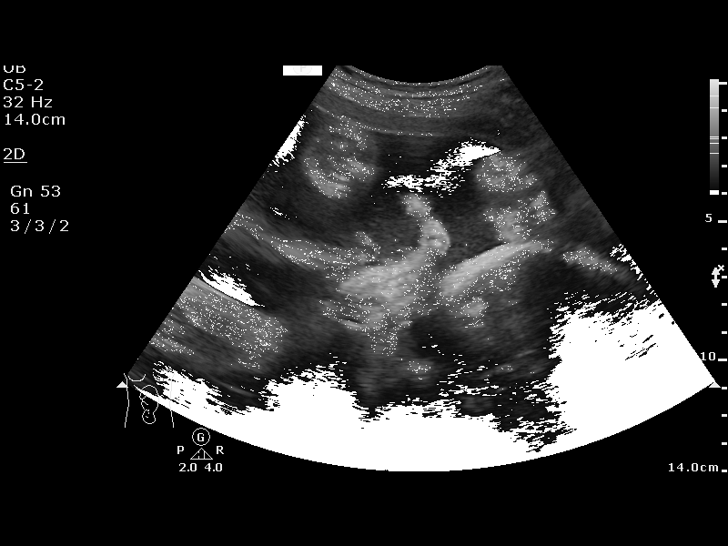
[im 6/13]
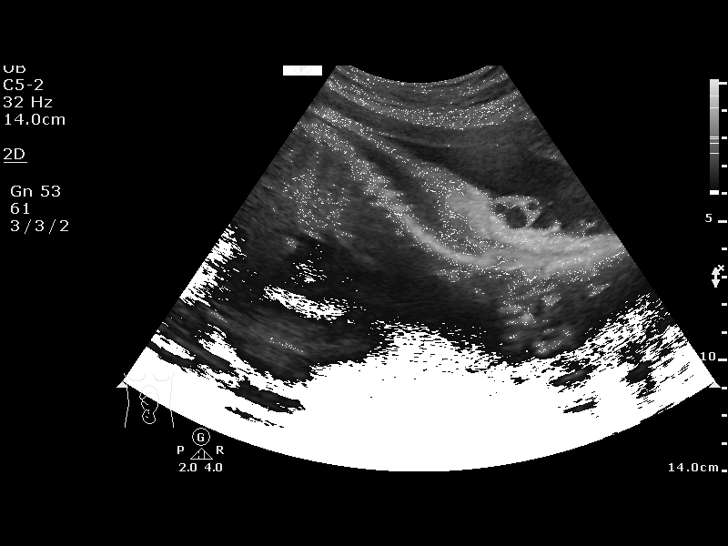
[im 7/13]
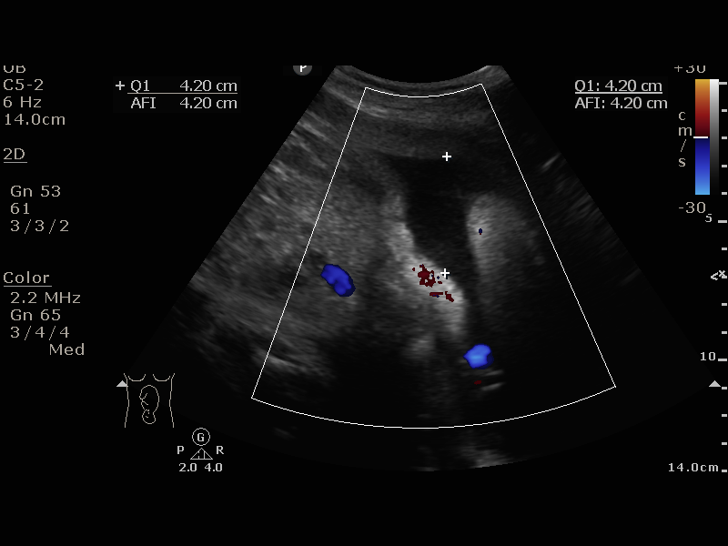
[im 8/13]
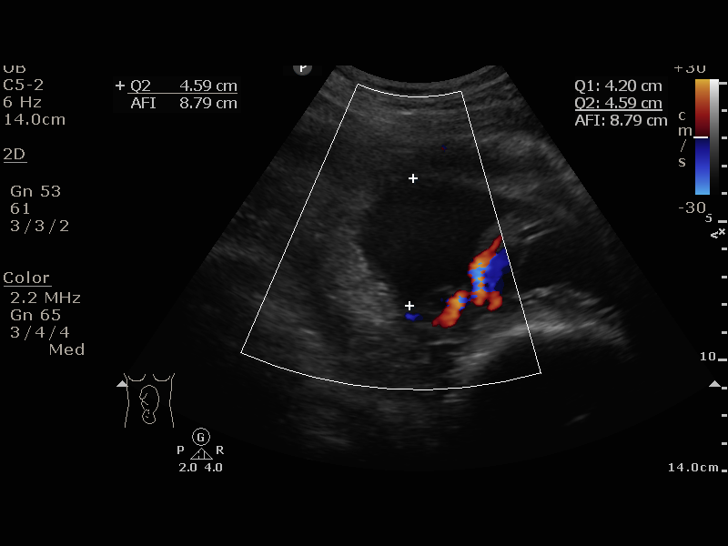
[im 9/13]
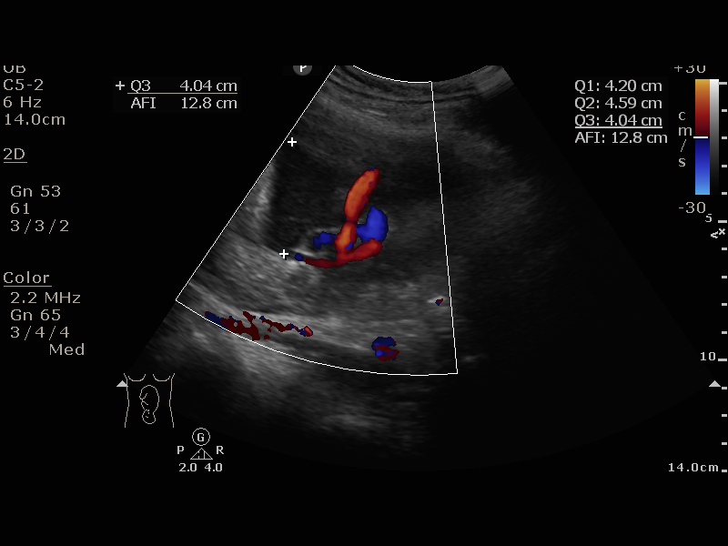
[im 10/13]
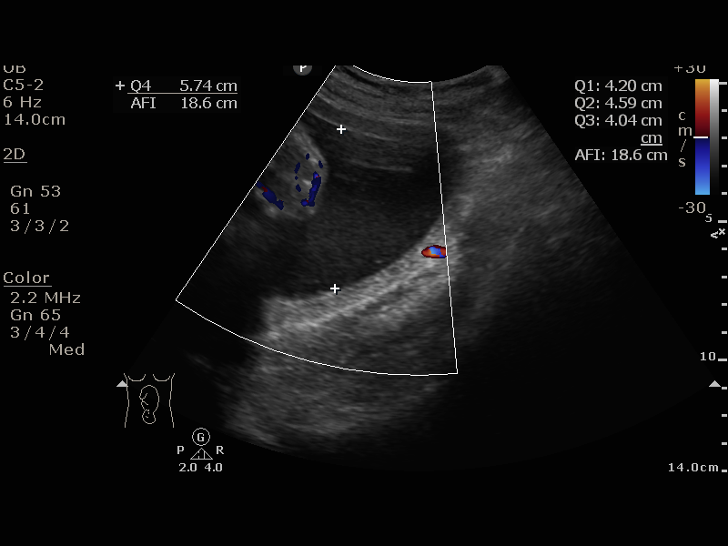
[im 11/13]
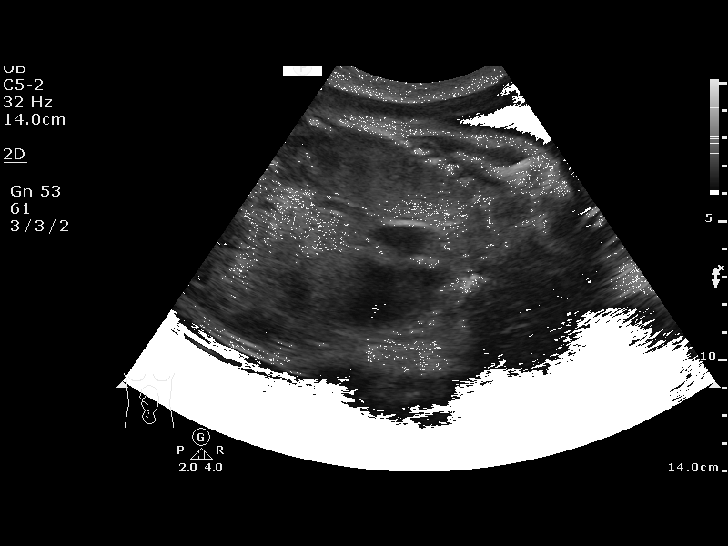
[im 12/13]
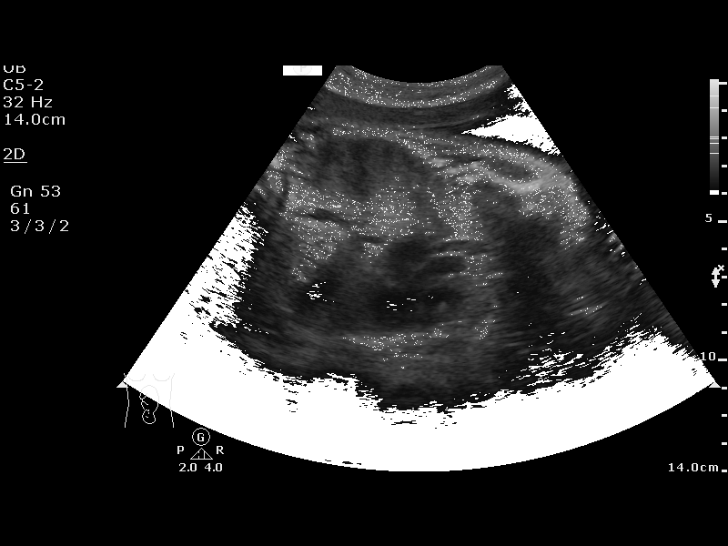
[im 13/13]
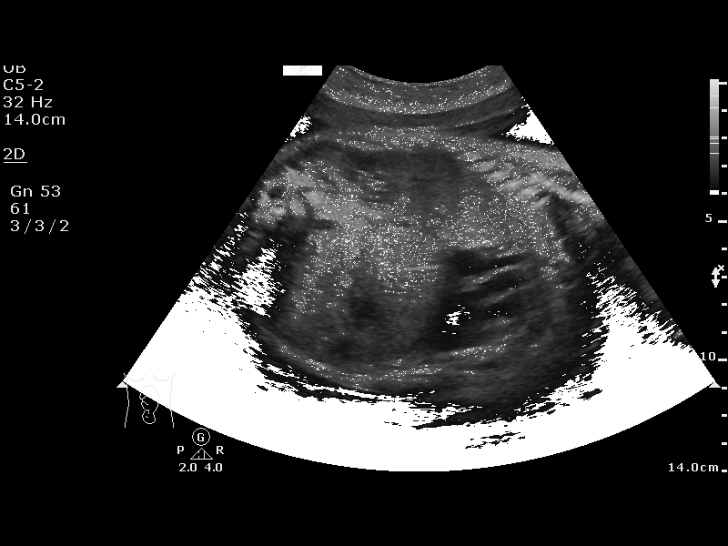

[13 of 13 positions shown; findings below may reference images not displayed]

OB/Gyn Clinic
[REDACTED]

1  US FETAL BPP W/NONSTRESS                    76818.4

1  SACHALIAM ATTEN            [id number]      [id number]     [id number]
Service(s) Provided

Indications

33 weeks gestation of pregnancy
Unspecified pre-existing hypertension
complicating pregnancy, unspecified
trimester
OB History

Blood Type:            Height:  5'1"   Weight (lb):  137       BMI:
Gravidity:    2         Term:   1        Prem:   0        SAB:   0
TOP:          0       Ectopic:  0        Living: 1
Fetal Evaluation

Num Of Fetuses:     1
Preg. Location:     Intrauterine
Cardiac Activity:   Observed
Presentation:       Cephalic
Amniotic Fluid
AFI FV:      Subjectively within normal limits

AFI Sum(cm)     %Tile       Largest Pocket(cm)
18.57           69

RUQ(cm)       RLQ(cm)       LUQ(cm)        LLQ(cm)
4.2
Biophysical Evaluation

Amniotic F.V:   Pocket => 2 cm two         F. Tone:        Observed
planes
F. Movement:    Observed                   N.S.T:          Reactive
F. Breathing:   Observed                   Score:          [DATE]
Gestational Age

LMP:           33w 4d        Date:  11/16/16                 EDD:   08/23/17
Best:          33w 4d     Det. By:  LMP  (11/16/16)          EDD:   08/23/17
Impression

Normal amniotic fluid volume. BPP [DATE].
Recommendations

Continue antenatal testing as recommended.

## 2018-10-30 MED FILL — ?CETIRIZINE HCL 10 MG TABLE: 10 | 30 days supply | Qty: 30 | Fill #4

## 2018-10-30 MED FILL — traZODone HCL 50 MG TABS: 50 | 30 days supply | Qty: 30 | Fill #2

## 2018-11-14 ENCOUNTER — Telehealth: Payer: Self-pay | Admitting: Pediatrics

## 2018-11-14 MED ORDER — OSELTAMIVIR PHOSPHATE 75 MG PO CAPS: 75.0000 mg | ORAL_CAPSULE | Freq: Every day | ORAL | 0 refills | Status: AC

## 2018-11-14 NOTE — Telephone Encounter (Signed)
Daughter with influenza- mother requesting prophylaxis.  Sent Tamiflu to pharmacy Murlean Hark MD

## 2018-11-30 MED FILL — ?CETIRIZINE HCL 10 MG TABLE: 10 | 30 days supply | Qty: 30 | Fill #5

## 2019-01-04 MED FILL — ?CETIRIZINE HCL 10 MG TABLE: 10 | 30 days supply | Qty: 30 | Fill #6

## 2019-01-04 MED FILL — traZODone HCL 50 MG TABS: 50 | 30 days supply | Qty: 30 | Fill #3

## 2019-01-06 ENCOUNTER — Telehealth: Payer: Self-pay | Admitting: Nurse Practitioner

## 2019-01-06 DIAGNOSIS — L719 Rosacea, unspecified: Secondary | ICD-10-CM

## 2019-01-06 MED ORDER — METRONIDAZOLE 0.75 % EX CREA: TOPICAL_CREAM | Freq: Two times a day (BID) | CUTANEOUS | 0 refills | Status: DC

## 2019-01-06 MED FILL — metroNIDAZOLE 0.75 % CREA: 0.75 | 30 days supply | Qty: 45 | Fill #0

## 2019-01-06 NOTE — Progress Notes (Signed)
E visit for Rosacea We are sorry that you are not feeling well. Here is how we plan to help! Based on what you shared with me it looks like you have Rosacea.  Rosacea is a common chronic skin condition that usually only affects the face and eyes.  Occasionally, the neck, chest, or other areas may be involved.  Characterized by redness, pimples, and broken blood vessels, rosacea tends to begin after middle age (between the ages of 64 and 27).  It is more common in fair-skinned people and women in menopause. It may appear differently in dark skinned people but rosacea effects all ethnic groups.  The cause of rosacea is not fully understood. We do know that rosacea is worsened by various trigger factors including, spicy or hot foods, hot beverages such as coffee or tea, alcohol, and sun exposure just to name a few.  Signs of rosacea may vary greatly from person to person. In some individuals it may only flare up from time to time.   I have prescribed: A topical antibiotic called Metronidazole 1%.  Apply a thin film to affected area once daily.  Wash your hands before and after use. Make sure your skin is clean and dry.  Rub in gently and completely over the affected areas.   HOME CARE: . Keep a record of triggers, such as stress, weather, or certain foods or drinks. Consider limiting hot or spicy foods and alcohol. . Always use sunscreen that protects against UVA and UVB rays and has a sun-protecting factor (SPF) of 15 or higher. . Avoid putting steroids on the skin sores. Steroids may make rosacea worse.  . You may use small amounts of water based cosmetic while using this medication.  Apply cosmetics after cream has dried. . If you shave your face, use an Copy . Don't scrub your skin or use sponges, brushes, or other abrasive tools. Doing so can irritate your skin. GET HELP RIGHT AWAY IF: . If your rosacea gets worse or is not better within 4 weeks. . If a new skin condition or rash  develops. . Loss of feeling or tingling of treated area . Nausea  MAKE SURE YOU    Understand these instructions.  Will watch your condition.  Will get help right away if you are not doing well or get worse.  Your e-visit answers were reviewed by a board certified advanced clinical practitioner to complete your personal care plan. Depending upon the condition, your plan could have included both over the counter or prescription medications. Please review your pharmacy choice. Be sure that the pharmacy you have chosen is open so that you can pick up your prescription now.  If there is a problem you may message your provider in [employer] to have the prescription routed to another pharmacy. Your safety is important to [place]. If you have drug allergies check your prescription carefully.  For the next 24 hours, you can use MyChart to ask questions about today's visit, request a non-urgent call back, or ask for a work or school excuse from your e-visit provider. You will get an email in the next two days asking about your experience. I hope that your e-visit has been valuable and will speed your recovery.  5 minutes spent reviewing and documenting in chart.

## 2019-02-05 ENCOUNTER — Other Ambulatory Visit: Payer: Self-pay | Admitting: Nurse Practitioner

## 2019-02-05 DIAGNOSIS — G4709 Other insomnia: Secondary | ICD-10-CM

## 2019-02-05 MED FILL — ?CETIRIZINE HCL 10 MG TABLE: 10 | 30 days supply | Qty: 30 | Fill #7

## 2019-02-16 ENCOUNTER — Other Ambulatory Visit: Payer: Self-pay | Admitting: Nurse Practitioner

## 2019-02-16 ENCOUNTER — Encounter: Payer: Self-pay | Admitting: Nurse Practitioner

## 2019-02-16 DIAGNOSIS — G4709 Other insomnia: Secondary | ICD-10-CM

## 2019-02-16 DIAGNOSIS — Z9109 Other allergy status, other than to drugs and biological substances: Secondary | ICD-10-CM

## 2019-02-16 MED ORDER — CETIRIZINE HCL 10 MG PO CAPS: 1.0000 | ORAL_CAPSULE | Freq: Every day | ORAL | 11 refills | Status: DC

## 2019-02-16 MED ORDER — TRAZODONE HCL 50 MG PO TABS: 50.0000 mg | ORAL_TABLET | Freq: Every evening | ORAL | 1 refills | Status: DC | PRN

## 2019-02-17 MED FILL — traZODone HCL 50 MG TABS: 50 | 30 days supply | Qty: 30 | Fill #0

## 2019-03-10 MED FILL — ?CETIRIZINE HCL 10 MG TABLE: 10 | 30 days supply | Qty: 30 | Fill #0

## 2019-03-18 ENCOUNTER — Telehealth: Payer: Self-pay | Admitting: Family Medicine

## 2019-03-24 ENCOUNTER — Telehealth (HOSPITAL_BASED_OUTPATIENT_CLINIC_OR_DEPARTMENT_OTHER): Payer: Self-pay | Admitting: Nurse Practitioner

## 2019-03-24 DIAGNOSIS — M25549 Pain in joints of unspecified hand: Secondary | ICD-10-CM

## 2019-03-24 DIAGNOSIS — Z9109 Other allergy status, other than to drugs and biological substances: Secondary | ICD-10-CM

## 2019-03-24 MED ORDER — DICLOFENAC SODIUM 1 % TD GEL: 2.0000 g | Freq: Four times a day (QID) | TRANSDERMAL | 0 refills | Status: AC

## 2019-03-24 MED ORDER — FLUTICASONE PROPIONATE 50 MCG/ACT NA SUSP: 1.0000 | Freq: Every day | NASAL | 0 refills | Status: DC

## 2019-03-24 MED FILL — FLUTICASONE PROP 50 MCG SPR: 50 | 30 days supply | Qty: 16 | Fill #0

## 2019-03-24 MED FILL — DICLOFENAC SODIUM 1 % GEL: 1 | 12 days supply | Qty: 100 | Fill #0

## 2019-03-26 ENCOUNTER — Encounter: Payer: Self-pay | Admitting: Nurse Practitioner

## 2019-03-26 NOTE — Progress Notes (Signed)
Virtual Visit via Telephone Note Due to national recommendations of social distancing due to [place] 19, telehealth visit is felt to be most appropriate for this patient at this time.  I discussed the limitations, risks, security and privacy concerns of performing an evaluation and management service by telephone and the availability of in person appointments. I also discussed with the patient that there may be a patient responsible charge related to this service. The patient expressed understanding and agreed to proceed.    I connected with Rhonda Graves on 03/24/19  at   3:30 PM EDT  EDT by telephone and verified that I am speaking with the correct person using two identifiers.   Consent I discussed the limitations, risks, security and privacy concerns of performing an evaluation and management service by telephone and the availability of in person appointments. I also discussed with the patient that there may be a patient responsible charge related to this service. The patient expressed understanding and agreed to proceed.   Location of Patient: Private Residence   Location of Provider: [place] [facility] [place] participating in Telemedicine visit: Geryl Rankins FNP-BC [place]    History of Present Illness: Telemedicine visit for: Nasal Congestion  Allergic Rhinitis:  She has a chronic history of allergies. She wakes up every morning with nasal congestion. Patient's symptoms include itchy eyes, nasal congestion and postnasal drip. These symptoms are perennial with seasonal exacerbation. Current triggers include exposure to pollens, dust and weather changes.  The patient has tried prescription antihistamines with satisfactory relief of symptoms. Immunotherapy has been used with good relief of symptoms. The patient has never had nasal polyps. The patient has no history of asthma. The patient does not suffer from frequent sinopulmonary infections.  The patient has not had sinus surgery in the past. The patient has no history of eczema.    Past Medical History:  Diagnosis Date  . Abnormal Pap smear    had colpo in past, normal pap since then  . GERD (gastroesophageal reflux disease) Dx 2010  . Headache(784.0)    migraine  . History of oral herpes simplex infection 05/29/2017   +IgM 1/2 antibody screen done for cold sores vs aphthous ulcers  . IBS (irritable bowel syndrome)   . Multiple allergies     Past Surgical History:  Procedure Laterality Date  . CESAREAN SECTION N/A 11/26/2013   Procedure: CESAREAN SECTION;  Surgeon: Lavonia Drafts, MD;  Location: [facility];  Service: Obstetrics;  Laterality: N/A;  . CESAREAN SECTION N/A 08/17/2017   Procedure: CESAREAN SECTION;  Surgeon: Jonnie Kind, MD;  Location: [facility];  Service: Obstetrics;  Laterality: N/A;    Family History  Problem Relation Age of Onset  . Hypothyroidism Mother   . Arthritis Mother   . Asthma Brother   . Allergic rhinitis Brother     Social History   Socioeconomic History  . Marital status: Married    Spouse name: Not on file  . Number of children: 1   . Years of education: college   . Highest education level: Not on file  Occupational History  . Occupation: Astronomer   Social Needs  . Financial resource strain: Not on file  . Food insecurity    Worry: Not on file    Inability: Not on file  . Transportation needs    Medical: Not on file    Non-medical: Not on file  Tobacco Use  . Smoking status: Never  Smoker  . Smokeless tobacco: Never Used  Substance and Sexual Activity  . Alcohol use: No    Comment: Not currently drinking  . Drug use: No  . Sexual activity: Yes    Birth control/protection: None  Lifestyle  . Physical activity    Days per week: Not on file    Minutes per session: Not on file  . Stress: Not on file  Relationships  . Social Herbalist on phone: Not on file    Gets together: Not on file     Attends religious service: Not on file    Active member of club or organization: Not on file    Attends meetings of clubs or organizations: Not on file    Relationship status: Not on file  Other Topics Concern  . Not on file  Social History Narrative   Originally from [place].   Has one daughter.   Lives with husband and daughter.      Observations/Objective: Awake, alert and oriented x 3   Review of Systems  Constitutional: Negative for fever, malaise/fatigue and weight loss.  HENT: Positive for congestion. Negative for nosebleeds.   Eyes: Negative.  Negative for blurred vision, double vision and photophobia.  Respiratory: Negative.  Negative for cough and shortness of breath.   Cardiovascular: Negative.  Negative for chest pain, palpitations and leg swelling.  Gastrointestinal: Negative.  Negative for heartburn, nausea and vomiting.  Musculoskeletal: Positive for joint pain. Negative for myalgias.  Neurological: Negative.  Negative for dizziness, focal weakness, seizures and headaches.  Psychiatric/Behavioral: Negative for suicidal ideas. The patient has insomnia.     Assessment and Plan: Diagnoses and all orders for this visit:  Environmental allergies -     fluticasone (FLONASE) 50 MCG/ACT nasal spray; Place 1 spray into both nostrils daily. PLEASE MAIL  Arthralgia of hand, unspecified laterality -     diclofenac sodium (VOLTAREN) 1 % GEL; Apply 2 g topically 4 (four) times daily. Patient would like mailed States she has been given diclofenac gel before in her native country. She applies it to her fingers for arthralgia which relieves her joint pain   Follow Up Instructions Return if symptoms worsen or fail to improve.     I discussed the assessment and treatment plan with the patient. The patient was provided an opportunity to ask questions and all were answered. The patient agreed with the plan and demonstrated an understanding of the instructions.   The patient was  advised to call back or seek an in-person evaluation if the symptoms worsen or if the condition fails to improve as anticipated.  I provided 18 minutes of non-face-to-face time during this encounter including median intraservice time, reviewing previous notes, labs, imaging, medications and explaining diagnosis and management.  Gildardo Pounds, FNP-BC

## 2019-03-30 ENCOUNTER — Other Ambulatory Visit: Payer: Self-pay

## 2019-03-31 ENCOUNTER — Other Ambulatory Visit: Payer: Self-pay

## 2019-03-31 ENCOUNTER — Ambulatory Visit: Payer: Self-pay | Attending: Nurse Practitioner

## 2019-03-31 DIAGNOSIS — Z0000 Encounter for general adult medical examination without abnormal findings: Secondary | ICD-10-CM

## 2019-03-31 MED FILL — traZODone HCL 50 MG TABS: 50 | 30 days supply | Qty: 30 | Fill #1

## 2019-04-01 LAB — BASIC METABOLIC PANEL
BUN/Creatinine Ratio: 19 (ref 9–23)
BUN: 13 mg/dL (ref 6–20)
CO2: 23 mmol/L (ref 20–29)
Calcium: 9 mg/dL (ref 8.7–10.2)
Chloride: 104 mmol/L (ref 96–106)
Creatinine, Ser: 0.69 mg/dL (ref 0.57–1.00)
GFR calc Af Amer: 129 mL/min/{1.73_m2} (ref >59)
GFR calc non Af Amer: 112 mL/min/{1.73_m2} (ref >59)
Glucose: 84 mg/dL (ref 65–99)
Potassium: 4.6 mmol/L (ref 3.5–5.2)
Sodium: 140 mmol/L (ref 134–144)

## 2019-04-01 LAB — LIPID PANEL
Chol/HDL Ratio: 2.9 ratio (ref 0.0–4.4)
Cholesterol, Total: 201 mg/dL — ABNORMAL HIGH (ref 100–199)
HDL: 69 mg/dL (ref >39)
LDL Calculated: 116 mg/dL — ABNORMAL HIGH (ref 0–99)
Triglycerides: 81 mg/dL (ref 0–149)
VLDL Cholesterol Cal: 16 mg/dL (ref 5–40)

## 2019-04-01 LAB — CBC
Hematocrit: 37.1 % (ref 34.0–46.6)
Hemoglobin: 12.2 g/dL (ref 11.1–15.9)
MCH: 30.9 pg (ref 26.6–33.0)
MCHC: 32.9 g/dL (ref 31.5–35.7)
MCV: 94 fL (ref 79–97)
Platelets: 246 10*3/uL (ref 150–450)
RBC: 3.95 x10E6/uL (ref 3.77–5.28)
RDW: 11.8 % (ref 11.7–15.4)
WBC: 4.9 10*3/uL (ref 3.4–10.8)

## 2019-04-02 ENCOUNTER — Other Ambulatory Visit: Payer: Self-pay | Admitting: Family Medicine

## 2019-04-02 MED ORDER — VALACYCLOVIR HCL 1 G PO TABS: 2000.0000 mg | ORAL_TABLET | Freq: Two times a day (BID) | ORAL | 6 refills | Status: DC

## 2019-04-02 MED FILL — valACYclovir HCL 1 GM TABS: 1 | 2 days supply | Qty: 8 | Fill #0

## 2019-04-12 MED FILL — ?CETIRIZINE HCL 10 MG TABLE: 10 | 30 days supply | Qty: 30 | Fill #1

## 2019-04-14 ENCOUNTER — Ambulatory Visit: Payer: Self-pay | Attending: Family Medicine

## 2019-04-14 ENCOUNTER — Other Ambulatory Visit: Payer: Self-pay

## 2019-04-14 ENCOUNTER — Ambulatory Visit: Payer: Self-pay

## 2019-04-26 ENCOUNTER — Telehealth: Payer: Self-pay | Admitting: Allergy & Immunology

## 2019-04-26 NOTE — Telephone Encounter (Signed)
I received a call from Rhonda Graves reporting that she ate pizza and her lips became swollen. This was nothing new and she thought that it would go away. Her throat was hurting and her eyes were swollen. She feels it on the inside. She has an out of date EpiPen. She reports a burning sensation on her lower lips and pulling. She is having a weird sensation in her throat. She took two Benadryl around two hours ago. She takes cetirizine 10mg  daily at night. She did take her blood pressure and it was 127/82. Pulse was 63 which is somewhat high for her since she has sinus bradycardia. She does have prednisone at home from her last appointment. She never took prednisone tablets from the last visit.   I recommended that she take 30mg  of prednisone and one more cetirizine now. She is going to text me in one hour with an update.   Salvatore Marvel, MD [facility]

## 2019-04-27 NOTE — Telephone Encounter (Signed)
Left voicemail to return call in regards to see how she is doing

## 2019-04-27 NOTE — Telephone Encounter (Signed)
Symptoms went away and the tip of tongue feels like she burned it/ soreness. Patient wanted to know if she should take anymore prednisone please advise

## 2019-04-27 NOTE — Telephone Encounter (Signed)
Contacted patient and recommended that she do prednisone 10mg  twice daily for four more days. Can we call later this week for an update?  Salvatore Marvel, MD [facility] [place]

## 2019-04-29 NOTE — Telephone Encounter (Signed)
Called patient left voicemail to return call need update on how she is doing

## 2019-05-14 ENCOUNTER — Other Ambulatory Visit: Payer: Self-pay | Admitting: Nurse Practitioner

## 2019-05-14 DIAGNOSIS — G4709 Other insomnia: Secondary | ICD-10-CM

## 2019-05-14 MED FILL — ?CETIRIZINE HCL 10 MG TABLE: 10 | 30 days supply | Qty: 30 | Fill #2

## 2019-05-14 MED FILL — valACYclovir HCL 1 GM TABS: 1 | 2 days supply | Qty: 8 | Fill #1

## 2019-05-20 ENCOUNTER — Other Ambulatory Visit: Payer: Self-pay | Admitting: Pharmacist

## 2019-05-20 MED ORDER — EPIPEN 2-PAK 0.3 MG/0.3ML IJ SOAJ: 0.3000 mg | INTRAMUSCULAR | 2 refills | Status: DC | PRN

## 2019-05-20 MED FILL — ?EPINOPHRINE 0.3MG AUTO-INJ: 0.3 | 2 days supply | Qty: 2 | Fill #0

## 2019-05-21 ENCOUNTER — Telehealth (HOSPITAL_BASED_OUTPATIENT_CLINIC_OR_DEPARTMENT_OTHER): Payer: Self-pay | Admitting: Nurse Practitioner

## 2019-05-21 DIAGNOSIS — M2550 Pain in unspecified joint: Secondary | ICD-10-CM

## 2019-05-21 DIAGNOSIS — G4700 Insomnia, unspecified: Secondary | ICD-10-CM

## 2019-05-21 NOTE — Progress Notes (Signed)
Virtual Visit via Telephone Note Due to national recommendations of social distancing due to [place] 19, telehealth visit is felt to be most appropriate for this patient at this time.  I discussed the limitations, risks, security and privacy concerns of performing an evaluation and management service by telephone and the availability of in person appointments. I also discussed with the patient that there may be a patient responsible charge related to this service. The patient expressed understanding and agreed to proceed.    I connected with Rhonda Graves on 05/22/19  at   1:30 PM EDT  EDT by telephone and verified that I am speaking with the correct person using two identifiers.   Consent I discussed the limitations, risks, security and privacy concerns of performing an evaluation and management service by telephone and the availability of in person appointments. I also discussed with the patient that there may be a patient responsible charge related to this service. The patient expressed understanding and agreed to proceed.   Location of Patient: Private Residence    Location of Provider: [facility] [employer] Office    Persons participating in Telemedicine visit: Geryl Rankins FNP-BC [place]    History of Present Illness: Telemedicine visit for: Follow up  has a past medical history of Abnormal Pap smear, GERD (gastroesophageal reflux disease) (Dx 2010), Headache(784.0), History of oral herpes simplex infection (05/29/2017), IBS (irritable bowel syndrome), and Multiple allergies.  She was supposed to have an in office visit today however she had to reschedule as a webex visit.   We discussed her recent lab results and all questions were answered.   Insomnia Somewhat controlled with trazodone 50mg  although she does not take this medication every night. She also takes melatonin with the trazodone.    Arthralgia of hand, unspecified laterality States she  has been given diclofenac gel before in her native country in the past. She applies it to her hand joints for arthralgia which relieves her joint pain  Today she has complaints of right thumb pain. Pain is worse with picking up her toddler or bringing her thumb to meet her pointer finger.     Past Medical History:  Diagnosis Date  . Abnormal Pap smear    had colpo in past, normal pap since then  . GERD (gastroesophageal reflux disease) Dx 2010  . Headache(784.0)    migraine  . History of oral herpes simplex infection 05/29/2017   +IgM 1/2 antibody screen done for cold sores vs aphthous ulcers  . IBS (irritable bowel syndrome)   . Multiple allergies     Past Surgical History:  Procedure Laterality Date  . CESAREAN SECTION N/A 11/26/2013   Procedure: CESAREAN SECTION;  Surgeon: Lavonia Drafts, MD;  Location: [facility];  Service: Obstetrics;  Laterality: N/A;  . CESAREAN SECTION N/A 08/17/2017   Procedure: CESAREAN SECTION;  Surgeon: Jonnie Kind, MD;  Location: [facility];  Service: Obstetrics;  Laterality: N/A;    Family History  Problem Relation Age of Onset  . Hypothyroidism Mother   . Arthritis Mother   . Asthma Brother   . Allergic rhinitis Brother     Social History   Socioeconomic History  . Marital status: Married    Spouse name: Not on file  . Number of children: 1   . Years of education: college   . Highest education level: Not on file  Occupational History  . Occupation: Astronomer   Social Needs  . Financial resource strain: Not  on file  . Food insecurity    Worry: Not on file    Inability: Not on file  . Transportation needs    Medical: Not on file    Non-medical: Not on file  Tobacco Use  . Smoking status: Never Smoker  . Smokeless tobacco: Never Used  Substance and Sexual Activity  . Alcohol use: No    Comment: Not currently drinking  . Drug use: No  . Sexual activity: Yes    Birth control/protection: None  Lifestyle  . Physical  activity    Days per week: Not on file    Minutes per session: Not on file  . Stress: Not on file  Relationships  . Social Herbalist on phone: Not on file    Gets together: Not on file    Attends religious service: Not on file    Active member of club or organization: Not on file    Attends meetings of clubs or organizations: Not on file    Relationship status: Not on file  Other Topics Concern  . Not on file  Social History Narrative   Originally from [place].   Has one daughter.   Lives with husband and daughter.      Observations/Objective: Awake, alert and oriented x 3   Review of Systems  Constitutional: Negative for fever, malaise/fatigue and weight loss.  HENT: Negative.  Negative for nosebleeds.   Eyes: Negative.  Negative for blurred vision, double vision and photophobia.  Respiratory: Negative.  Negative for cough and shortness of breath.   Cardiovascular: Negative.  Negative for chest pain, palpitations and leg swelling.  Gastrointestinal: Negative.  Negative for heartburn, nausea and vomiting.  Musculoskeletal: Positive for joint pain. Negative for myalgias.  Neurological: Negative.  Negative for dizziness, focal weakness, seizures and headaches.  Psychiatric/Behavioral: Negative for suicidal ideas. The patient has insomnia.     Physical Exam  Constitutional: She is oriented to person, place, and time and well-developed, well-nourished, and in no distress.  HENT:  Head: Normocephalic.  Eyes: EOM are normal.  Pulmonary/Chest: Effort normal.  Musculoskeletal: Normal range of motion.  Neurological: She is alert and oriented to person, place, and time.  Psychiatric: Mood, memory, affect and judgment normal.   Assessment and Plan:   Insomnia, unspecified type Continue trazodone as prescribed  Arthralgia of multiple sites -     diclofenac sodium (VOLTAREN) 1 % GEL; Apply 2 g topically 4 (four) times daily. Advised to wear wrist/thumb brace/splint  for next 4 weeks.      Follow Up Instructions No follow-ups on file.     I discussed the assessment and treatment plan with the patient. The patient was provided an opportunity to ask questions and all were answered. The patient agreed with the plan and demonstrated an understanding of the instructions.   The patient was advised to call back or seek an in-person evaluation if the symptoms worsen or if the condition fails to improve as anticipated.  I provided 18 minutes of non-face-to-face time during this encounter including median intraservice time, reviewing previous notes, labs, imaging, medications and explaining diagnosis and management.  Gildardo Pounds, FNP-BC

## 2019-05-22 ENCOUNTER — Encounter: Payer: Self-pay | Admitting: Nurse Practitioner

## 2019-05-22 MED ORDER — DICLOFENAC SODIUM 1 % TD GEL: 2.0000 g | Freq: Four times a day (QID) | TRANSDERMAL | 0 refills | Status: DC

## 2019-05-24 ENCOUNTER — Other Ambulatory Visit: Payer: Self-pay | Admitting: Nurse Practitioner

## 2019-05-24 ENCOUNTER — Telehealth: Payer: Self-pay | Admitting: Nurse Practitioner

## 2019-05-24 DIAGNOSIS — G4709 Other insomnia: Secondary | ICD-10-CM

## 2019-05-24 MED ORDER — ELETRIPTAN HYDROBROMIDE 20 MG PO TABS: 20.0000 mg | ORAL_TABLET | ORAL | 0 refills | Status: DC | PRN

## 2019-05-24 MED FILL — DICLOFENAC SODIUM 1% GEL: 1 | 12 days supply | Qty: 100 | Fill #0

## 2019-05-24 MED FILL — ELETRIPTAN HBR 20 MG TABLET: 20 | 18 days supply | Qty: 6 | Fill #0

## 2019-05-24 NOTE — Telephone Encounter (Signed)
I have sent to the pharmacy however I can not guarantee if we have it in stock. Thanks

## 2019-05-24 NOTE — Telephone Encounter (Signed)
New Message   Pt states she suffers from severe headaches and would like to know if she can be prescribed realpax. Please f/u

## 2019-05-24 NOTE — Telephone Encounter (Signed)
Will route to PCP 

## 2019-05-25 MED FILL — ?traZODONE HCL 50MG TAB: 50 | 30 days supply | Qty: 30 | Fill #0

## 2019-05-25 NOTE — Telephone Encounter (Signed)
CMA spoke to patient and inform medication was sent.  Pt. Is aware.

## 2019-06-01 ENCOUNTER — Telehealth: Payer: Self-pay

## 2019-06-17 MED FILL — ?CETIRIZINE HCL 10 MG TABLE: 10 | 30 days supply | Qty: 30 | Fill #3

## 2019-06-18 ENCOUNTER — Other Ambulatory Visit: Payer: Self-pay

## 2019-06-18 ENCOUNTER — Ambulatory Visit (INDEPENDENT_AMBULATORY_CARE_PROVIDER_SITE_OTHER): Payer: Self-pay | Admitting: General Practice

## 2019-06-18 DIAGNOSIS — Z23 Encounter for immunization: Secondary | ICD-10-CM

## 2019-06-18 NOTE — Progress Notes (Signed)
Ignacia Lea here for Influenza  Injection.  Injection administered without complication. Patient will return as needed for care.  Derinda Late, RN 06/18/2019  11:57 AM

## 2019-06-19 NOTE — Progress Notes (Signed)
Patient seen and assessed by nursing staff during this encounter. I have reviewed the chart and agree with the documentation and plan.  Verita Schneiders, MD 06/19/2019 10:18 AM

## 2019-07-09 ENCOUNTER — Encounter: Payer: Self-pay | Admitting: Nurse Practitioner

## 2019-07-09 ENCOUNTER — Other Ambulatory Visit: Payer: Self-pay | Admitting: Nurse Practitioner

## 2019-07-12 ENCOUNTER — Other Ambulatory Visit: Payer: Self-pay | Admitting: Nurse Practitioner

## 2019-07-12 DIAGNOSIS — M79646 Pain in unspecified finger(s): Secondary | ICD-10-CM

## 2019-07-12 DIAGNOSIS — M2550 Pain in unspecified joint: Secondary | ICD-10-CM

## 2019-07-13 ENCOUNTER — Other Ambulatory Visit: Payer: Self-pay

## 2019-07-13 DIAGNOSIS — Z20822 Contact with and (suspected) exposure to covid-19: Secondary | ICD-10-CM

## 2019-07-14 ENCOUNTER — Other Ambulatory Visit: Payer: Self-pay

## 2019-07-14 LAB — NOVEL CORONAVIRUS, NAA: SARS-CoV-2, NAA: NOT DETECTED

## 2019-07-16 MED FILL — DICLOFENAC SODIUM 1% GEL: 1 | 12 days supply | Qty: 100 | Fill #1

## 2019-07-16 MED FILL — ?CETIRIZINE HCL 10 MG TABLE: 10 | 30 days supply | Qty: 30 | Fill #4

## 2019-07-20 ENCOUNTER — Other Ambulatory Visit: Payer: Self-pay | Admitting: Nurse Practitioner

## 2019-07-20 ENCOUNTER — Encounter: Payer: Self-pay | Admitting: Nurse Practitioner

## 2019-07-20 MED ORDER — ELETRIPTAN HYDROBROMIDE 20 MG PO TABS: 20.0000 mg | ORAL_TABLET | ORAL | 2 refills | Status: DC | PRN

## 2019-07-20 MED ORDER — ELETRIPTAN HYDROBROMIDE 40 MG PO TABS: 40.0000 mg | ORAL_TABLET | ORAL | 2 refills | Status: DC | PRN

## 2019-07-21 MED FILL — ELETRIPTAN HYDROBROMIDE 40: 40 | 18 days supply | Qty: 6 | Fill #0

## 2019-07-22 ENCOUNTER — Ambulatory Visit: Payer: Self-pay | Admitting: Orthopaedic Surgery

## 2019-07-22 ENCOUNTER — Other Ambulatory Visit: Payer: Self-pay

## 2019-07-23 ENCOUNTER — Other Ambulatory Visit: Payer: Self-pay

## 2019-07-28 ENCOUNTER — Ambulatory Visit: Payer: Self-pay

## 2019-07-28 ENCOUNTER — Ambulatory Visit (INDEPENDENT_AMBULATORY_CARE_PROVIDER_SITE_OTHER): Payer: Self-pay

## 2019-07-28 ENCOUNTER — Other Ambulatory Visit: Payer: Self-pay

## 2019-07-28 ENCOUNTER — Encounter: Payer: Self-pay | Admitting: Orthopaedic Surgery

## 2019-07-28 ENCOUNTER — Ambulatory Visit (INDEPENDENT_AMBULATORY_CARE_PROVIDER_SITE_OTHER): Payer: Self-pay | Admitting: Orthopaedic Surgery

## 2019-07-28 VITALS — Ht 61.0 in | Wt 145.0 lb

## 2019-07-28 DIAGNOSIS — M79642 Pain in left hand: Secondary | ICD-10-CM

## 2019-07-28 DIAGNOSIS — M79641 Pain in right hand: Secondary | ICD-10-CM

## 2019-07-28 MED ORDER — MELOXICAM 7.5 MG PO TABS: 7.5000 mg | ORAL_TABLET | Freq: Two times a day (BID) | ORAL | 2 refills | Status: DC | PRN

## 2019-07-28 MED FILL — MELOXICAM 7.5 MG TABLET: 7.5 | 15 days supply | Qty: 30 | Fill #0

## 2019-07-28 NOTE — Progress Notes (Signed)
Office Visit Note   Patient: Rhonda Graves           Date of Birth: Jul 28, 1981           MRN: [id number] Visit Date: 07/28/2019              Requested by: Rhonda Pounds, NP Rhonda Graves,  Rhonda Graves [id number] PCP: Rhonda Pounds, NP   Assessment & Plan: Visit Diagnoses:  1. Bilateral hand pain     Plan: My impression is bilateral hand osteoarthritis.  We will obtain arthritis panel to rule out inflammatory arthritis.  I recommended 2-week trial of meloxicam.  We will notify the patient of the results of the arthritis panel.  Follow-Up Instructions: Return if symptoms worsen or fail to improve.   Orders:  Orders Placed This Encounter  Procedures  . XR Hand Complete Left  . XR Hand Complete Right  . Uric acid  . Antinuclear Antib (ANA)  . Sed Rate (ESR)  . Rheumatoid Factor   Meds ordered this encounter  Medications  . meloxicam (MOBIC) 7.5 MG tablet    Sig: Take 1 tablet (7.5 mg total) by mouth 2 (two) times daily as needed for pain.    Dispense:  30 tablet    Refill:  2      Procedures: No procedures performed   Clinical Data: No additional findings.   Subjective: Chief Complaint  Patient presents with  . Right Hand - Pain  . Left Hand - Pain    Rhonda Graves is coming in for evaluation of bilateral hand pain specifically of her right thumb and left long finger.  She endorses generalized discomfort.  She has been using Voltaren gel that was recommended by PCP and she is not sure how well this works.  Denies any numbness and tingling.  Denies any family history of inflammatory arthritis.  Denies any injuries.  Denies any constitutional symptoms.   Review of Systems  Constitutional: Negative.   HENT: Negative.   Eyes: Negative.   Respiratory: Negative.   Cardiovascular: Negative.   Endocrine: Negative.   Musculoskeletal: Negative.   Neurological: Negative.   Hematological: Negative.   Psychiatric/Behavioral: Negative.   All other systems reviewed  and are negative.    Objective: Vital Signs: Ht '5\' 1"'$  (1.549 m)   Wt 145 lb (65.8 kg)   BMI 27.40 kg/m   Physical Exam Vitals signs and nursing note reviewed.  Constitutional:      Appearance: She is well-developed.  HENT:     Head: Normocephalic and atraumatic.  Neck:     Musculoskeletal: Neck supple.  Pulmonary:     Effort: Pulmonary effort is normal.  Abdominal:     Palpations: Abdomen is soft.  Skin:    General: Skin is warm.     Capillary Refill: Capillary refill takes less than 2 seconds.  Neurological:     Mental Status: She is alert and oriented to person, place, and time.  Psychiatric:        Behavior: Behavior normal.        Thought Content: Thought content normal.        Judgment: Judgment normal.    Ortho exam Bilateral hand exams show no nodules or deformities.  She has slight tenderness to palpation of the PIP joint of the left long finger.  There is no triggering.  There is no swelling.  Specialty Comments:  No specialty comments available.  Imaging: Xr Hand Complete Left  Result Date: 07/28/2019  No acute or structural abnormalities  Xr Hand Complete Right  Result Date: 07/28/2019 No acute or structural abnormalities.    PMFS History: Patient Active Problem List   Diagnosis Date Noted  . Status post repeat low transverse cesarean section 08/20/2017  . Chronic hypertension 08/16/2017  . Muscle spasms of neck 07/30/2017  . Edema of right lower extremity 05/29/2017  . AMA (advanced maternal age) multigravida 82+, second trimester 03/06/2017  . History of cesarean delivery 03/06/2017  . Sinus bradycardia 03/06/2017  . Supervision of high risk pregnancy, antepartum 01/06/2017  . 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 Environmental allergies 02/02/2016  . Nevus of multiple sites of trunk 09/08/2014  . Chronic hypertension in pregnancy 02/18/2014  . Migraine 02/18/2014  . Polyhydramnios affecting pregnancy in third trimester 11/25/2013  . IBS  (irritable bowel syndrome) 04/08/2013   Past Medical History:  Diagnosis Date  . Abnormal Pap smear    had colpo in past, normal pap since then  . GERD (gastroesophageal reflux disease) Dx 2010  . Headache(784.0)    migraine  . History of oral herpes simplex infection 05/29/2017   +IgM 1/2 antibody screen done for cold sores vs aphthous ulcers  . IBS (irritable bowel syndrome)   . Multiple allergies     Family History  Problem Relation Age of Onset  . Hypothyroidism Mother   . Arthritis Mother   . Asthma Brother   . Allergic rhinitis Brother     Past Surgical History:  Procedure Laterality Date  . CESAREAN SECTION N/A 11/26/2013   Procedure: CESAREAN SECTION;  Surgeon: Lavonia Drafts, MD;  Location: [facility];  Service: Obstetrics;  Laterality: N/A;  . CESAREAN SECTION N/A 08/17/2017   Procedure: CESAREAN SECTION;  Surgeon: Jonnie Kind, MD;  Location: [facility];  Service: Obstetrics;  Laterality: N/A;   Social History   Occupational History  . Occupation: Interpreter   Tobacco Use  . Smoking status: Never Smoker  . Smokeless tobacco: Never Used  Substance and Sexual Activity  . Alcohol use: No    Comment: Not currently drinking  . Drug use: No  . Sexual activity: Yes    Birth control/protection: None

## 2019-07-29 LAB — RHEUMATOID FACTOR: Rheumatoid fact SerPl-aCnc: <14 [IU]/mL (ref <14)

## 2019-07-29 LAB — URIC ACID: Uric Acid, Serum: 4.6 mg/dL (ref 2.5–7.0)

## 2019-07-29 LAB — SEDIMENTATION RATE: Sed Rate: 6 mm/h (ref 0–20)

## 2019-07-29 LAB — ANA: Anti Nuclear Antibody (ANA): NEGATIVE

## 2019-07-30 NOTE — Progress Notes (Signed)
Arthritis panel all normal.  Please let her know if you have a chance.  Thanks.

## 2019-08-12 ENCOUNTER — Encounter: Payer: Self-pay | Admitting: Nurse Practitioner

## 2019-08-12 ENCOUNTER — Other Ambulatory Visit: Payer: Self-pay | Admitting: Nurse Practitioner

## 2019-08-12 DIAGNOSIS — E785 Hyperlipidemia, unspecified: Secondary | ICD-10-CM

## 2019-08-13 ENCOUNTER — Other Ambulatory Visit: Payer: Self-pay | Admitting: Nurse Practitioner

## 2019-08-14 LAB — LIPID PANEL
Chol/HDL Ratio: 2.3 ratio (ref 0.0–4.4)
Cholesterol, Total: 201 mg/dL — ABNORMAL HIGH (ref 100–199)
HDL: 87 mg/dL (ref >39)
LDL Chol Calc (NIH): 102 mg/dL — ABNORMAL HIGH (ref 0–99)
Triglycerides: 66 mg/dL (ref 0–149)
VLDL Cholesterol Cal: 12 mg/dL (ref 5–40)

## 2019-08-23 MED FILL — ?CETIRIZINE HCL 10 MG TABLE: 10 | 30 days supply | Qty: 30 | Fill #5

## 2019-09-10 MED FILL — ?CETIRIZINE HCL 10 MG TABLE: 10 | 30 days supply | Qty: 30 | Fill #6

## 2019-09-27 ENCOUNTER — Other Ambulatory Visit: Payer: Self-pay

## 2019-09-27 ENCOUNTER — Ambulatory Visit (INDEPENDENT_AMBULATORY_CARE_PROVIDER_SITE_OTHER): Payer: Self-pay | Admitting: Family Medicine

## 2019-09-27 VITALS — Wt 150.0 lb

## 2019-09-27 DIAGNOSIS — N898 Other specified noninflammatory disorders of vagina: Secondary | ICD-10-CM

## 2019-09-27 DIAGNOSIS — Z113 Encounter for screening for infections with a predominantly sexual mode of transmission: Secondary | ICD-10-CM

## 2019-09-27 DIAGNOSIS — R102 Pelvic and perineal pain: Secondary | ICD-10-CM

## 2019-09-27 NOTE — Progress Notes (Signed)
   Subjective:    Patient ID: Rhonda Graves, female    DOB: 1981-05-16, 39 y.o.   MRN: [id number]  HPI Here for pelvic cramping. Usually gets cramping around ovulation and before 2-3 days before period. For last 6 weeks, has had constant daily pelvic cramping. Menses normal, every 27-29 days. Normal flow. Denies abnormal discharge, breakthrough bleeding or spotting.   Review of Systems     Objective:   Physical Exam Constitutional:      Appearance: Normal appearance.  Cardiovascular:     Rate and Rhythm: Normal rate.     Pulses: Normal pulses.  Abdominal:     General: Abdomen is flat. There is no distension.     Palpations: Abdomen is soft.     Tenderness: There is no abdominal tenderness. There is no guarding.  Neurological:     Mental Status: She is alert.  Psychiatric:        Mood and Affect: Mood normal.        Behavior: Behavior normal.        Thought Content: Thought content normal.        Judgment: Judgment normal.        Assessment & Plan:  1. Pelvic pain Check [place] for structural changes: fibroids, adenomyosis, cyst, etc. Also check wet prep for mild infection. - [place] GYN Transvaginal; Future - Wet prep, genital

## 2019-09-27 NOTE — Addendum Note (Signed)
Addended by: Alric Seton on: 09/27/2019 04:32 PM   Modules accepted: Orders

## 2019-09-28 LAB — CERVICOVAGINAL ANCILLARY ONLY
Bacterial Vaginitis (gardnerella): NEGATIVE
Candida Glabrata: NEGATIVE
Candida Vaginitis: NEGATIVE
Chlamydia: NEGATIVE
Comment: NEGATIVE
Comment: NEGATIVE
Comment: NEGATIVE
Comment: NEGATIVE
Comment: NEGATIVE
Comment: NORMAL
Neisseria Gonorrhea: NEGATIVE
Trichomonas: NEGATIVE

## 2019-10-05 ENCOUNTER — Other Ambulatory Visit: Payer: Self-pay

## 2019-10-05 ENCOUNTER — Ambulatory Visit: Payer: HRSA Program | Attending: Internal Medicine

## 2019-10-05 ENCOUNTER — Ambulatory Visit (HOSPITAL_COMMUNITY)
Admission: RE | Admit: 2019-10-05 | Discharge: 2019-10-05 | Disposition: A | Discharge status: Home / Self Care | Payer: Self-pay | Source: Ambulatory Visit | Attending: Family Medicine | Admitting: Family Medicine

## 2019-10-05 DIAGNOSIS — R102 Pelvic and perineal pain: Secondary | ICD-10-CM | POA: Insufficient documentation

## 2019-10-05 DIAGNOSIS — Z20822 Contact with and (suspected) exposure to covid-19: Secondary | ICD-10-CM | POA: Insufficient documentation

## 2019-10-07 LAB — NOVEL CORONAVIRUS, NAA: SARS-CoV-2, NAA: NOT DETECTED

## 2019-10-19 MED FILL — ?CETIRIZINE HCL 10 MG TABLE: 10 | 30 days supply | Qty: 30 | Fill #7

## 2019-11-05 ENCOUNTER — Other Ambulatory Visit: Payer: Self-pay

## 2019-11-05 ENCOUNTER — Ambulatory Visit: Payer: Self-pay | Attending: Nurse Practitioner

## 2019-11-10 ENCOUNTER — Ambulatory Visit: Payer: Self-pay | Attending: Nurse Practitioner | Admitting: Nurse Practitioner

## 2019-11-10 ENCOUNTER — Encounter: Payer: Self-pay | Admitting: Nurse Practitioner

## 2019-11-10 DIAGNOSIS — M62838 Other muscle spasm: Secondary | ICD-10-CM

## 2019-11-10 DIAGNOSIS — G43709 Chronic migraine without aura, not intractable, without status migrainosus: Secondary | ICD-10-CM

## 2019-11-10 DIAGNOSIS — IMO0002 Reserved for concepts with insufficient information to code with codable children: Secondary | ICD-10-CM

## 2019-11-10 MED ORDER — CYCLOBENZAPRINE HCL 5 MG PO TABS: 5.0000 mg | ORAL_TABLET | Freq: Three times a day (TID) | ORAL | 1 refills | Status: DC | PRN

## 2019-11-10 MED ORDER — SUMATRIPTAN SUCCINATE 50 MG PO TABS: 50.0000 mg | ORAL_TABLET | ORAL | 1 refills | Status: DC | PRN

## 2019-11-10 MED FILL — ELETRIPTAN HYDROBROMIDE 40: 40 | 18 days supply | Qty: 6 | Fill #1

## 2019-11-10 MED FILL — CYCLOBENZAPRINE 5 MG TABLET: 5 | 10 days supply | Qty: 30 | Fill #0

## 2019-11-10 NOTE — Progress Notes (Signed)
Virtual Visit via Telephone Note Due to national recommendations of social distancing due to [place] 19, telehealth visit is felt to be most appropriate for this patient at this time.  I discussed the limitations, risks, security and privacy concerns of performing an evaluation and management service by telephone and the availability of in person appointments. I also discussed with the patient that there may be a patient responsible charge related to this service. The patient expressed understanding and agreed to proceed.    I connected with Rhonda Graves on 11/10/19  at   3:10 PM EST  EDT by telephone and verified that I am speaking with the correct person using two identifiers.   Consent I discussed the limitations, risks, security and privacy concerns of performing an evaluation and management service by telephone and the availability of in person appointments. I also discussed with the patient that there may be a patient responsible charge related to this service. The patient expressed understanding and agreed to proceed.   Location of Patient: Private Residence    Location of Provider: [place] [facility] [place] participating in Telemedicine visit: Geryl Rankins FNP-BC [place]    History of Present Illness: Telemedicine visit for: Medication Questions  Should she use home Nasacort or prescription Flonase? Instructed that she could use either nasal spray and if the OTC Nasacort is providing relief of her nasal congestion then she can continue on that.   Feels tightness in chest after taking eletriptan. Would like to discontinue and try alternative.   Endorses tightness in the back of her neck. Feels like she could have slept wrong. Pain and tightness does not radiate.    Past Medical History:  Diagnosis Date  . Abnormal Pap smear    had colpo in past, normal pap since then  . GERD (gastroesophageal reflux disease) Dx 2010  .  Headache(784.0)    migraine  . History of oral herpes simplex infection 05/29/2017   +IgM 1/2 antibody screen done for cold sores vs aphthous ulcers  . IBS (irritable bowel syndrome)   . Multiple allergies     Past Surgical History:  Procedure Laterality Date  . CESAREAN SECTION N/A 11/26/2013   Procedure: CESAREAN SECTION;  Surgeon: Lavonia Drafts, MD;  Location: [facility];  Service: Obstetrics;  Laterality: N/A;  . CESAREAN SECTION N/A 08/17/2017   Procedure: CESAREAN SECTION;  Surgeon: Jonnie Kind, MD;  Location: [facility];  Service: Obstetrics;  Laterality: N/A;    Family History  Problem Relation Age of Onset  . Hypothyroidism Mother   . Arthritis Mother   . Asthma Brother   . Allergic rhinitis Brother     Social History   Socioeconomic History  . Marital status: Married    Spouse name: Not on file  . Number of children: 1   . Years of education: college   . Highest education level: Not on file  Occupational History  . Occupation: Interpreter   Tobacco Use  . Smoking status: Never Smoker  . Smokeless tobacco: Never Used  Substance and Sexual Activity  . Alcohol use: Yes    Comment: occasionally   . Drug use: No  . Sexual activity: Yes    Birth control/protection: None  Other Topics Concern  . Not on file  Social History Narrative   Originally from [place].   Has one daughter.   Lives with husband and daughter.    Social Determinants of Health   Financial  Resource Strain:   . Difficulty of Paying Living Expenses: Not on file  Food Insecurity:   . Worried About Charity fundraiser in the Last Year: Not on file  . Ran Out of Food in the Last Year: Not on file  Transportation Needs:   . Lack of Transportation (Medical): Not on file  . Lack of Transportation (Non-Medical): Not on file  Physical Activity:   . Days of Exercise per Week: Not on file  . Minutes of Exercise per Session: Not on file  Stress:   . Feeling of Stress : Not on file   Social Connections:   . Frequency of Communication with Friends and Family: Not on file  . Frequency of Social Gatherings with Friends and Family: Not on file  . Attends Religious Services: Not on file  . Active Member of Clubs or Organizations: Not on file  . Attends Archivist Meetings: Not on file  . Marital Status: Not on file     Observations/Objective: Awake, alert and oriented x 3   Review of Systems  Constitutional: Negative for fever, malaise/fatigue and weight loss.  HENT: Positive for congestion. Negative for nosebleeds.        Nasal congestion  Eyes: Negative.  Negative for blurred vision, double vision and photophobia.  Respiratory: Negative.  Negative for cough and shortness of breath.   Cardiovascular: Negative.  Negative for chest pain, palpitations and leg swelling.  Gastrointestinal: Negative.  Negative for heartburn, nausea and vomiting.  Musculoskeletal: Positive for myalgias and neck pain.  Neurological: Positive for headaches. Negative for dizziness, focal weakness and seizures.  Psychiatric/Behavioral: Negative.  Negative for suicidal ideas.    Assessment and Plan: Rhonda Graves was seen today for sinus problem.  Diagnoses and all orders for this visit:  Muscle spasms of neck -     cyclobenzaprine (FLEXERIL) 5 MG tablet; Take 1 tablet (5 mg total) by mouth 3 (three) times daily as needed for muscle spasms.  Chronic migraine -     SUMAtriptan (IMITREX) 50 MG tablet; Take 1 tablet (50 mg total) by mouth every 2 (two) hours as needed for migraine. May repeat in 2 hours if headache persists or recurs.     Follow Up Instructions Return if symptoms worsen or fail to improve.     I discussed the assessment and treatment plan with the patient. The patient was provided an opportunity to ask questions and all were answered. The patient agreed with the plan and demonstrated an understanding of the instructions.   The patient was advised to call back or  seek an in-person evaluation if the symptoms worsen or if the condition fails to improve as anticipated.  I provided 16 minutes of non-face-to-face time during this encounter including median intraservice time, reviewing previous notes, labs, imaging, medications and explaining diagnosis and management.  Gildardo Pounds, FNP-BC

## 2019-11-11 MED FILL — ?SUMATRIPTAN SUCC 50 MG TAB: 50 | 23 days supply | Qty: 9 | Fill #0

## 2019-11-15 ENCOUNTER — Encounter: Payer: Self-pay | Admitting: Nurse Practitioner

## 2019-11-25 ENCOUNTER — Other Ambulatory Visit: Payer: Self-pay | Admitting: Nurse Practitioner

## 2019-11-25 DIAGNOSIS — Z1231 Encounter for screening mammogram for malignant neoplasm of breast: Secondary | ICD-10-CM

## 2019-11-25 MED FILL — ?CETIRIZINE HCL 10 MG TABLE: 10 | 30 days supply | Qty: 30 | Fill #8

## 2019-11-26 ENCOUNTER — Other Ambulatory Visit: Payer: Self-pay

## 2019-11-26 ENCOUNTER — Ambulatory Visit
Admission: RE | Admit: 2019-11-26 | Discharge: 2019-11-26 | Disposition: A | Discharge status: Home / Self Care | Payer: No Typology Code available for payment source | Source: Ambulatory Visit | Attending: Nurse Practitioner | Admitting: Nurse Practitioner

## 2019-11-26 DIAGNOSIS — Z1231 Encounter for screening mammogram for malignant neoplasm of breast: Secondary | ICD-10-CM

## 2019-12-23 MED FILL — ?CETIRIZINE HCL 10 MG TABLE: 10 | 30 days supply | Qty: 30 | Fill #9

## 2019-12-31 ENCOUNTER — Other Ambulatory Visit: Payer: Self-pay | Admitting: Nurse Practitioner

## 2019-12-31 DIAGNOSIS — Z9109 Other allergy status, other than to drugs and biological substances: Secondary | ICD-10-CM

## 2019-12-31 MED FILL — FLUTICASONE PROP 50 MCG SPR: 50 | 30 days supply | Qty: 16 | Fill #0

## 2020-01-11 ENCOUNTER — Ambulatory Visit: Payer: Self-pay | Admitting: Sports Medicine

## 2020-01-26 ENCOUNTER — Other Ambulatory Visit: Payer: Self-pay | Admitting: Nurse Practitioner

## 2020-01-26 DIAGNOSIS — M2550 Pain in unspecified joint: Secondary | ICD-10-CM

## 2020-01-26 MED FILL — ?CETIRIZINE HCL 10 MG TABLE: 10 | 30 days supply | Qty: 30 | Fill #10

## 2020-01-27 MED FILL — DICLOFENAC SODIUM 1% GEL: 1 | 12 days supply | Qty: 100 | Fill #0

## 2020-01-28 ENCOUNTER — Other Ambulatory Visit: Payer: Self-pay

## 2020-02-14 ENCOUNTER — Ambulatory Visit: Payer: Self-pay

## 2020-02-22 ENCOUNTER — Other Ambulatory Visit: Payer: Self-pay | Admitting: Nurse Practitioner

## 2020-02-22 DIAGNOSIS — Z9109 Other allergy status, other than to drugs and biological substances: Secondary | ICD-10-CM

## 2020-02-25 ENCOUNTER — Other Ambulatory Visit: Payer: Self-pay | Admitting: Nurse Practitioner

## 2020-02-25 MED FILL — ?CETIRIZINE HCL 10 MG TABLE: 10 | 30 days supply | Qty: 30 | Fill #0

## 2020-03-29 MED FILL — ?CETIRIZINE HCL 10 MG TABLE: 10 | 30 days supply | Qty: 30 | Fill #1

## 2020-04-26 ENCOUNTER — Other Ambulatory Visit: Payer: Self-pay | Admitting: Nurse Practitioner

## 2020-04-26 DIAGNOSIS — IMO0002 Reserved for concepts with insufficient information to code with codable children: Secondary | ICD-10-CM

## 2020-04-26 MED FILL — ?CETIRIZINE HCL 10 MG TABLE: 10 | 30 days supply | Qty: 30 | Fill #2

## 2020-04-26 NOTE — Telephone Encounter (Signed)
Requested medication (s) are due for refill today: yes  Requested medication (s) are on the active medication list: yes  Last refill:  11/10/19  Future visit scheduled: No  Notes to clinic:  Failed because last BP 130/96    Requested Prescriptions  Pending Prescriptions Disp Refills   SUMAtriptan (IMITREX) 50 MG tablet [Pharmacy Med Name: SUMATRIPTAN SUCC 50 MG TAB 50 Tablet] 9 tablet 1    Sig: Take 1 tablet (50 mg total) by mouth every 2 (two) hours as needed for migraine. May repeat in 2 hours if headache persists or recurs.      Neurology:  Migraine Therapy - Triptan Failed - 04/26/2020  1:50 PM      Failed - Last BP in normal range    BP Readings from Last 1 Encounters:  08/17/18 (!) 130/96          Passed - Valid encounter within last 12 months    Recent Outpatient Visits           5 months ago Muscle spasms of neck   [place], Vernia Buff, NP   11 months ago Insomnia, unspecified type   [place], Vernia Buff, NP   1 year ago Environmental allergies   [place], Vernia Buff, NP   2 years ago Sore throat (viral)   [place], Vernia Buff, NP   3 years ago Essential hypertension, benign   [facility] Boykin Nearing, MD

## 2020-04-27 MED FILL — ?SUMATRIPTAN SUCC 50 MG TAB: 50 | 23 days supply | Qty: 9 | Fill #0

## 2020-05-12 ENCOUNTER — Other Ambulatory Visit: Payer: Self-pay

## 2020-05-12 ENCOUNTER — Ambulatory Visit: Payer: Self-pay

## 2020-06-08 MED FILL — ?CETIRIZINE HCL 10 MG TABLE: 10 | 30 days supply | Qty: 30 | Fill #3

## 2020-06-13 ENCOUNTER — Encounter: Payer: Self-pay | Admitting: Nurse Practitioner

## 2020-06-15 ENCOUNTER — Ambulatory Visit: Payer: Self-pay | Admitting: Allergy & Immunology

## 2020-06-16 ENCOUNTER — Other Ambulatory Visit: Payer: Self-pay | Admitting: Nurse Practitioner

## 2020-06-16 ENCOUNTER — Ambulatory Visit: Payer: Self-pay | Attending: Nurse Practitioner | Admitting: Nurse Practitioner

## 2020-06-16 ENCOUNTER — Other Ambulatory Visit: Payer: Self-pay

## 2020-06-16 ENCOUNTER — Encounter: Payer: Self-pay | Admitting: Nurse Practitioner

## 2020-06-16 ENCOUNTER — Ambulatory Visit (HOSPITAL_BASED_OUTPATIENT_CLINIC_OR_DEPARTMENT_OTHER): Payer: No Typology Code available for payment source | Admitting: Pharmacist

## 2020-06-16 VITALS — BP 126/88 | HR 63 | Temp 97.7°F | Ht 61.0 in | Wt 153.0 lb

## 2020-06-16 DIAGNOSIS — Z131 Encounter for screening for diabetes mellitus: Secondary | ICD-10-CM

## 2020-06-16 DIAGNOSIS — Z130 Encounter for screening for diseases of the blood and blood-forming organs and certain disorders involving the immune mechanism: Secondary | ICD-10-CM

## 2020-06-16 DIAGNOSIS — Z1329 Encounter for screening for other suspected endocrine disorder: Secondary | ICD-10-CM

## 2020-06-16 DIAGNOSIS — M2550 Pain in unspecified joint: Secondary | ICD-10-CM

## 2020-06-16 DIAGNOSIS — Z1159 Encounter for screening for other viral diseases: Secondary | ICD-10-CM

## 2020-06-16 DIAGNOSIS — G4709 Other insomnia: Secondary | ICD-10-CM

## 2020-06-16 DIAGNOSIS — Z23 Encounter for immunization: Secondary | ICD-10-CM

## 2020-06-16 DIAGNOSIS — Z13228 Encounter for screening for other metabolic disorders: Secondary | ICD-10-CM

## 2020-06-16 DIAGNOSIS — Z13220 Encounter for screening for lipoid disorders: Secondary | ICD-10-CM

## 2020-06-16 DIAGNOSIS — Z124 Encounter for screening for malignant neoplasm of cervix: Secondary | ICD-10-CM

## 2020-06-16 DIAGNOSIS — Z9109 Other allergy status, other than to drugs and biological substances: Secondary | ICD-10-CM

## 2020-06-16 DIAGNOSIS — Z1321 Encounter for screening for nutritional disorder: Secondary | ICD-10-CM

## 2020-06-16 DIAGNOSIS — Z114 Encounter for screening for human immunodeficiency virus [HIV]: Secondary | ICD-10-CM

## 2020-06-16 MED ORDER — DICLOFENAC SODIUM 1 % EX GEL: CUTANEOUS | 1 refills | Status: DC

## 2020-06-16 MED ORDER — TRAZODONE HCL 50 MG PO TABS: 50.0000 mg | ORAL_TABLET | Freq: Every evening | ORAL | 2 refills | Status: DC | PRN

## 2020-06-16 MED ORDER — CETIRIZINE HCL 10 MG PO TABS: ORAL_TABLET | ORAL | 11 refills | Status: DC

## 2020-06-16 MED FILL — ?TRAZODONE HCL 50 TABS: 50 | 30 days supply | Qty: 30 | Fill #0

## 2020-06-16 MED FILL — DICLOFENAC SODIUM 1% GEL: 1 | 12 days supply | Qty: 100 | Fill #0

## 2020-06-16 NOTE — Progress Notes (Signed)
Patient presents for vaccination against influenza per orders of [facility]. Consent given. Counseling provided. No contraindications exists. Vaccine administered without incident.  ° °Luke Van Ausdall, PharmD, CPP °Clinical Pharmacist °[facility] °[phone] ° °

## 2020-06-16 NOTE — Progress Notes (Signed)
Assessment & Plan:  Rhonda Graves was seen today for gynecologic exam.  Diagnoses and all orders for this visit:  Encounter for Papanicolaou smear for cervical cancer screening -     Cytology - PAP -     Cervicovaginal ancillary only  Need for hepatitis C screening test -     Hepatitis C Antibody  Encounter for screening for HIV -     HIV antibody (with reflex)  Environmental allergies -     cetirizine (ZYRTEC) 10 MG tablet; TAKE 1 CAPSULE (10 MG TOTAL) BY MOUTH DAILY.  Arthralgia of multiple sites -     diclofenac Sodium (VOLTAREN) 1 % GEL; APPLY 2 G TOPICALLY 4 (FOUR) TIMES DAILY.  Other insomnia -     traZODone (DESYREL) 50 MG tablet; Take 1 tablet (50 mg total) by mouth at bedtime as needed. for sleep  Encounter for screening for diabetes mellitus -     Hemoglobin A1c  Screening for deficiency anemia -     CBC  Screening for metabolic disorder -     SWH67+RFFM  Thyroid disorder screening -     TSH  Lipid screening -     Lipid panel  Encounter for vitamin deficiency screening -     VITAMIN D 25 Hydroxy (Vit-D Deficiency, Fractures)  Other orders -     Flu Vaccine QUAD 6+ mos PF IM (Fluarix Quad PF)    Patient has been counseled on age-appropriate routine health concerns for screening and prevention. These are reviewed and up-to-date. Referrals have been placed accordingly. Immunizations are up-to-date or declined.    Subjective:   Chief Complaint  Patient presents with  . Gynecologic Exam    Pt. is here for a pap smear.    HPI Rhonda Graves 39 y.o. female presents to office today   Review of Systems  Constitutional: Negative.  Negative for chills, fever, malaise/fatigue and weight loss.  HENT: Negative for congestion, ear discharge, ear pain, hearing loss, sinus pain and sore throat.   Eyes: Negative.   Respiratory: Negative.  Negative for cough, sputum production, shortness of breath and wheezing.   Cardiovascular: Negative.  Negative for chest pain,  orthopnea and leg swelling.  Gastrointestinal: Negative.  Negative for abdominal pain, diarrhea, nausea and vomiting.  Genitourinary: Negative.  Negative for flank pain.  Skin: Negative.  Negative for rash.  Neurological: Negative for dizziness, focal weakness and headaches.  Endo/Heme/Allergies: Negative for environmental allergies.  Psychiatric/Behavioral: Negative for suicidal ideas. The patient is nervous/anxious and has insomnia.     Past Medical History:  Diagnosis Date  . Abnormal Pap smear    had colpo in past, normal pap since then  . GERD (gastroesophageal reflux disease) Dx 2010  . Headache(784.0)    migraine  . History of oral herpes simplex infection 05/29/2017   +IgM 1/2 antibody screen done for cold sores vs aphthous ulcers  . IBS (irritable bowel syndrome)   . Multiple allergies     Past Surgical History:  Procedure Laterality Date  . CESAREAN SECTION N/A 11/26/2013   Procedure: CESAREAN SECTION;  Surgeon: Lavonia Drafts, MD;  Location: [facility];  Service: Obstetrics;  Laterality: N/A;  . CESAREAN SECTION N/A 08/17/2017   Procedure: CESAREAN SECTION;  Surgeon: Jonnie Kind, MD;  Location: [facility];  Service: Obstetrics;  Laterality: N/A;    Family History  Problem Relation Age of Onset  . Hypothyroidism Mother   . Arthritis Mother   . Asthma Brother   . Allergic rhinitis  Brother     Social History Reviewed with no changes to be made today.   Outpatient Medications Prior to Visit  Medication Sig Dispense Refill  . EPIPEN 2-PAK 0.3 MG/0.3ML SOAJ injection Inject 0.3 mLs (0.3 mg total) into the muscle as needed. 1 each 2  . SUMAtriptan (IMITREX) 50 MG tablet Take 1 tab at onset of headache. Repeat once in 2 hours if headache persists or recurs. Must have office visit for refills 9 tablet 0  . diclofenac Sodium (VOLTAREN) 1 % GEL APPLY 2 G TOPICALLY 4 (FOUR) TIMES DAILY. 100 g 0  . cyclobenzaprine (FLEXERIL) 5 MG tablet Take 1 tablet (5 mg  total) by mouth 3 (three) times daily as needed for muscle spasms. (Patient not taking: Reported on 06/16/2020) 30 tablet 1  . valACYclovir (VALTREX) 1000 MG tablet Take 2 tablets (2,000 mg total) by mouth 2 (two) times daily. For two day (Patient not taking: Reported on 11/10/2019) 8 tablet 6  . cetirizine (ZYRTEC) 10 MG tablet TAKE 1 CAPSULE (10 MG TOTAL) BY MOUTH DAILY. (Patient not taking: Reported on 06/16/2020) 30 tablet 11  . fluticasone (FLONASE) 50 MCG/ACT nasal spray Place 1 spray into both nostrils daily. PLEASE MAIL 16 g 2  . meloxicam (MOBIC) 7.5 MG tablet Take 1 tablet (7.5 mg total) by mouth 2 (two) times daily as needed for pain. (Patient not taking: Reported on 09/27/2019) 30 tablet 2  . traZODone (DESYREL) 50 MG tablet TAKE 1 TABLET (50 MG TOTAL) BY MOUTH AT BEDTIME AS NEEDED FOR UP TO 30 DAYS. FOR SLEEP 30 tablet 2   No facility-administered medications prior to visit.    Allergies  Allergen Reactions  . Other Anaphylaxis    Walnuts  . 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 Peanut-Containing Drug Products Hives  . Hemabate [Carboprost Tromethamine] Itching and Swelling    Had allergic reaction after Lomotil, Lysteda and Hemabate. Uncertain what caused reaction.  . Lomotil [Diphenoxylate] Itching and Swelling    Had allergic reaction after Lomotil, Lysteda and Hemabate. Uncertain what caused reaction.  Marshell Levan [Tranexamic Acid] Itching and Swelling    Had allergic reaction after Lomotil, Lysteda and Hemabate. Uncertain what caused reaction.       Objective:    BP 126/88 (BP Location: Left Arm, Patient Position: Sitting, Cuff Size: Normal)   Pulse 63   Temp 97.7 F (36.5 C) (Temporal)   Ht _0  (1.549 m)   Wt 153 lb (69.4 kg)   LMP 05/27/2020   SpO2 99%   BMI 28.91 kg/m  Wt Readings from Last 3 Encounters:  06/16/20 153 lb (69.4 kg)  09/27/19 150 lb (68 kg)  07/28/19 145 lb (65.8 kg)    Physical Exam Exam conducted with a chaperone present.  Constitutional:       Appearance: She is well-developed.  HENT:     Head: Normocephalic.  Cardiovascular:     Rate and Rhythm: Normal rate and regular rhythm.     Heart sounds: Normal heart sounds.  Pulmonary:     Effort: Pulmonary effort is normal.     Breath sounds: Normal breath sounds.  Abdominal:     General: Bowel sounds are normal.     Palpations: Abdomen is soft.     Hernia: There is no hernia in the left inguinal area.  Genitourinary:    Exam position: Lithotomy position.     Labia:        Right: No rash, tenderness, lesion or injury.  Left: No rash, tenderness, lesion or injury.      Vagina: Normal. No signs of injury and foreign body. No vaginal discharge, erythema, tenderness or bleeding.     Cervix: No cervical motion tenderness or friability.     Uterus: Not deviated and not enlarged.      Adnexa:        Right: No mass, tenderness or fullness.         Left: No mass, tenderness or fullness.       Rectum: Normal. No external hemorrhoid.  Lymphadenopathy:     Lower Body: No right inguinal adenopathy. No left inguinal adenopathy.  Skin:    General: Skin is warm and dry.  Neurological:     Mental Status: She is alert and oriented to person, place, and time.  Psychiatric:        Behavior: Behavior normal.        Thought Content: Thought content normal.        Judgment: Judgment normal.          Patient has been counseled extensively about nutrition and exercise as well as the importance of adherence with medications and regular follow-up. The patient was given clear instructions to go to ER or return to medical center if symptoms don't improve, worsen or new problems develop. The patient verbalized understanding.   Follow-up: Return in about 6 months (around 12/14/2020).   Gildardo Pounds, FNP-BC [facility] [place], [place]   06/16/2020, 10:35 AM

## 2020-06-16 NOTE — Patient Instructions (Addendum)
  [facility] [place] [address],  [phone]   [address], [place], [place] [address]  [phone]

## 2020-06-17 LAB — CBC
Hematocrit: 38.2 % (ref 34.0–46.6)
Hemoglobin: 12.6 g/dL (ref 11.1–15.9)
MCH: 31.4 pg (ref 26.6–33.0)
MCHC: 33 g/dL (ref 31.5–35.7)
MCV: 95 fL (ref 79–97)
Platelets: 236 10*3/uL (ref 150–450)
RBC: 4.01 x10E6/uL (ref 3.77–5.28)
RDW: 12 % (ref 11.7–15.4)
WBC: 5 10*3/uL (ref 3.4–10.8)

## 2020-06-17 LAB — CMP14+EGFR
ALT: 39 IU/L — ABNORMAL HIGH (ref 0–32)
AST: 29 IU/L (ref 0–40)
Albumin/Globulin Ratio: 2.1 (ref 1.2–2.2)
Albumin: 4.6 g/dL (ref 3.8–4.8)
Alkaline Phosphatase: 78 IU/L (ref 44–121)
BUN/Creatinine Ratio: 18 (ref 9–23)
BUN: 14 mg/dL (ref 6–20)
Bilirubin Total: 0.5 mg/dL (ref 0.0–1.2)
CO2: 21 mmol/L (ref 20–29)
Calcium: 9.1 mg/dL (ref 8.7–10.2)
Chloride: 104 mmol/L (ref 96–106)
Creatinine, Ser: 0.77 mg/dL (ref 0.57–1.00)
GFR calc Af Amer: 113 mL/min/{1.73_m2} (ref >59)
GFR calc non Af Amer: 98 mL/min/{1.73_m2} (ref >59)
Globulin, Total: 2.2 g/dL (ref 1.5–4.5)
Glucose: 90 mg/dL (ref 65–99)
Potassium: 4.4 mmol/L (ref 3.5–5.2)
Sodium: 138 mmol/L (ref 134–144)
Total Protein: 6.8 g/dL (ref 6.0–8.5)

## 2020-06-17 LAB — LIPID PANEL
Chol/HDL Ratio: 2.5 ratio (ref 0.0–4.4)
Cholesterol, Total: 237 mg/dL — ABNORMAL HIGH (ref 100–199)
HDL: 94 mg/dL (ref >39)
LDL Chol Calc (NIH): 133 mg/dL — ABNORMAL HIGH (ref 0–99)
Triglycerides: 61 mg/dL (ref 0–149)
VLDL Cholesterol Cal: 10 mg/dL (ref 5–40)

## 2020-06-17 LAB — HEMOGLOBIN A1C
Est. average glucose Bld gHb Est-mCnc: 88 mg/dL
Hgb A1c MFr Bld: 4.7 % — ABNORMAL LOW (ref 4.8–5.6)

## 2020-06-17 LAB — HEPATITIS C ANTIBODY: Hep C Virus Ab: <0.1 s/co ratio (ref 0.0–0.9)

## 2020-06-17 LAB — TSH: TSH: 1.52 u[IU]/mL (ref 0.450–4.500)

## 2020-06-17 LAB — HIV ANTIBODY (ROUTINE TESTING W REFLEX): HIV Screen 4th Generation wRfx: NONREACTIVE

## 2020-06-17 LAB — VITAMIN D 25 HYDROXY (VIT D DEFICIENCY, FRACTURES): Vit D, 25-Hydroxy: 30.7 ng/mL (ref 30.0–100.0)

## 2020-06-19 ENCOUNTER — Ambulatory Visit: Payer: No Typology Code available for payment source | Admitting: Nurse Practitioner

## 2020-06-19 LAB — CERVICOVAGINAL ANCILLARY ONLY
Bacterial Vaginitis (gardnerella): NEGATIVE
Candida Glabrata: NEGATIVE
Candida Vaginitis: NEGATIVE
Chlamydia: NEGATIVE
Comment: NEGATIVE
Comment: NEGATIVE
Comment: NEGATIVE
Comment: NEGATIVE
Comment: NEGATIVE
Comment: NORMAL
Neisseria Gonorrhea: NEGATIVE
Trichomonas: NEGATIVE

## 2020-06-20 LAB — CYTOLOGY - PAP
Comment: NEGATIVE
Diagnosis: NEGATIVE
High risk HPV: NEGATIVE

## 2020-07-05 ENCOUNTER — Other Ambulatory Visit: Payer: Self-pay | Admitting: Family Medicine

## 2020-07-05 MED FILL — ?CETIRIZINE HCL 10 MG TABLE: 10 | 30 days supply | Qty: 30 | Fill #4

## 2020-07-05 MED FILL — ?SUMATRIPTAN SUCC 50 MG TAB: 50 | 23 days supply | Qty: 9 | Fill #0

## 2020-08-07 ENCOUNTER — Ambulatory Visit: Payer: No Typology Code available for payment source | Admitting: Nurse Practitioner

## 2020-08-07 ENCOUNTER — Other Ambulatory Visit: Payer: Self-pay | Admitting: Pharmacist

## 2020-08-07 MED ORDER — SUMATRIPTAN SUCCINATE 50 MG PO TABS: ORAL_TABLET | ORAL | 0 refills | Status: DC

## 2020-08-07 MED FILL — SUMATRIPTAN SUCC 50 MG TAB: 50 | 30 days supply | Qty: 9 | Fill #0

## 2020-08-07 MED FILL — ?TRAZODONE HCL 50 TABS: 50 | 30 days supply | Qty: 30 | Fill #1

## 2020-08-07 MED FILL — ?CETIRIZINE HCL 10 MG TABLE: 10 | 30 days supply | Qty: 30 | Fill #5

## 2020-09-08 MED FILL — ?CETIRIZINE HCL 10 MG TABLE: 10 | 30 days supply | Qty: 30 | Fill #6

## 2020-09-08 MED FILL — DICLOFENAC SODIUM 1% GEL: 1 | 12 days supply | Qty: 100 | Fill #1

## 2020-09-08 MED FILL — CYCLOBENZAPRINE 5 MG TABLET: 5 | 10 days supply | Qty: 30 | Fill #1

## 2020-10-13 MED FILL — ?CETIRIZINE HCL 10 MG TABLE: 10 | 30 days supply | Qty: 30 | Fill #7

## 2020-11-09 MED FILL — ?CETIRIZINE HCL 10 MG TABLE: 10 | 30 days supply | Qty: 30 | Fill #8

## 2020-12-04 ENCOUNTER — Other Ambulatory Visit: Payer: Self-pay | Admitting: Nurse Practitioner

## 2020-12-04 ENCOUNTER — Encounter: Payer: Self-pay | Admitting: Nurse Practitioner

## 2020-12-04 MED ORDER — FLUTICASONE PROPIONATE 50 MCG/ACT NA SUSP: 1.0000 | Freq: Every day | NASAL | 6 refills | Status: DC

## 2020-12-11 MED FILL — ?CETIRIZINE HCL 10 MG TABLE: 10 | 30 days supply | Qty: 30 | Fill #9

## 2020-12-18 ENCOUNTER — Other Ambulatory Visit: Payer: Self-pay | Admitting: Family Medicine

## 2020-12-18 NOTE — Telephone Encounter (Signed)
Requested medication (s) are due for refill today:no  Requested medication (s) are on the active medication list: yes  Last refill: 08/07/2020  Future visit scheduled:no  Notes to clinic:  review for refill Last filled on 08/07/2020   Requested Prescriptions  Pending Prescriptions Disp Refills   SUMAtriptan (IMITREX) 50 MG tablet [Pharmacy Med Name: SUMATRIPTAN SUCC 50 MG TAB 50 Tablet] 9 tablet 0    Sig: TAKE 1 TABLET BY MOUTH AT ONSET OF HEADACHE. MAY REPEAT IN 2 HOURS IF HEADACHE PERSISTS OR RECURS.      Neurology:  Migraine Therapy - Triptan Passed - 12/18/2020  9:01 AM      Passed - Last BP in normal range    BP Readings from Last 1 Encounters:  06/16/20 126/88          Passed - Valid encounter within last 12 months    Recent Outpatient Visits           6 months ago Need for influenza vaccination   [place], Jarome Matin, RPH-CPP   6 months ago Encounter for Papanicolaou smear for cervical cancer screening   [place], Vernia Buff, NP   1 year ago Muscle spasms of neck   [place], Vernia Buff, NP   1 year ago Insomnia, unspecified type   [place], Vernia Buff, NP   1 year ago Environmental allergies   [place], Vernia Buff, NP

## 2020-12-19 MED FILL — SUMATRIPTAN SUCC 50 MG TAB: 50 | 30 days supply | Qty: 9 | Fill #0

## 2020-12-23 ENCOUNTER — Other Ambulatory Visit: Payer: Self-pay

## 2020-12-26 ENCOUNTER — Other Ambulatory Visit: Payer: Self-pay

## 2021-01-25 ENCOUNTER — Other Ambulatory Visit: Payer: Self-pay

## 2021-01-25 MED FILL — Cetirizine HCl Tab 10 MG: ORAL | 30 days supply | Qty: 30 | Fill #0 | Status: AC

## 2021-01-26 ENCOUNTER — Other Ambulatory Visit: Payer: Self-pay

## 2021-02-23 ENCOUNTER — Other Ambulatory Visit: Payer: Self-pay

## 2021-02-23 MED FILL — Cetirizine HCl Tab 10 MG: ORAL | 30 days supply | Qty: 30 | Fill #1 | Status: AC

## 2021-02-27 ENCOUNTER — Other Ambulatory Visit: Payer: Self-pay

## 2021-02-28 ENCOUNTER — Ambulatory Visit: Payer: No Typology Code available for payment source

## 2021-03-30 ENCOUNTER — Other Ambulatory Visit: Payer: Self-pay | Admitting: Family Medicine

## 2021-03-30 ENCOUNTER — Other Ambulatory Visit: Payer: Self-pay

## 2021-03-30 ENCOUNTER — Other Ambulatory Visit: Payer: Self-pay | Admitting: Nurse Practitioner

## 2021-03-30 DIAGNOSIS — Z9109 Other allergy status, other than to drugs and biological substances: Secondary | ICD-10-CM

## 2021-03-30 MED ORDER — CETIRIZINE HCL 10 MG PO TABS
ORAL_TABLET | Freq: Every day | ORAL | 2 refills | Status: DC
  Filled 2021-03-30: qty 30, 30d supply, fill #0
  Filled 2021-04-16: qty 30, 30d supply, fill #1
  Filled 2021-05-29: qty 30, 30d supply, fill #2

## 2021-03-30 NOTE — Telephone Encounter (Signed)
Requested medications are due for refill today yes  Requested medications are on the active medication list yes  Last refill 12/19/20  Last visit 05/2020  Future visit scheduled No, pt canceled appt 02/2021  Notes to clinic No signature/instructions on this rx, please assess.

## 2021-04-06 ENCOUNTER — Telehealth: Payer: Self-pay | Admitting: Nurse Practitioner

## 2021-04-06 ENCOUNTER — Other Ambulatory Visit: Payer: Self-pay

## 2021-04-06 ENCOUNTER — Other Ambulatory Visit: Payer: Self-pay | Admitting: Family Medicine

## 2021-04-06 NOTE — Telephone Encounter (Signed)
  Notes to clinic:  Patient canceled appt on 02/28/2021 Review refill    Requested Prescriptions  Pending Prescriptions Disp Refills   EPIPEN 2-PAK 0.3 MG/0.3ML SOAJ injection 1 each 2    Sig: Inject 0.3 mLs (0.3 mg total) into the muscle as needed.      Immunology: Antidotes Passed - 04/06/2021 12:00 PM      Passed - Valid encounter within last 12 months    Recent Outpatient Visits           9 months ago Need for influenza vaccination   [place], RPH-CPP   9 months ago Encounter for Papanicolaou smear for cervical cancer screening   [place], Vernia Buff, NP   1 year ago Muscle spasms of neck   [place], Vernia Buff, NP   1 year ago Insomnia, unspecified type   [place], Vernia Buff, NP   2 years ago Environmental allergies   [place], Vernia Buff, NP

## 2021-04-06 NOTE — Telephone Encounter (Signed)
Called pt following up on a CRM requesting medication refill for EPIPEN 2-PAK 0.3 MG/0.3ML SOAJ injection [[id number]] .( Reason for CRM: pt called saying she is going out of town august 1 to the 23.  She needs her epi pen refilled before she goes.  She wants to know if she can be seen before she leaves) **  LVM: PT could be seen on Tuesday 19th of July at [facility] location w/ [employer].

## 2021-04-09 ENCOUNTER — Other Ambulatory Visit: Payer: Self-pay

## 2021-04-10 ENCOUNTER — Other Ambulatory Visit: Payer: Self-pay

## 2021-04-10 ENCOUNTER — Other Ambulatory Visit: Payer: Self-pay | Admitting: Family Medicine

## 2021-04-10 DIAGNOSIS — M62838 Other muscle spasm: Secondary | ICD-10-CM

## 2021-04-10 MED ORDER — EPINEPHRINE 0.3 MG/0.3ML IJ SOAJ
0.3000 mg | INTRAMUSCULAR | 2 refills | Status: DC | PRN
  Filled 2021-04-10 – 2022-04-03 (×2): qty 2, 2d supply, fill #0

## 2021-04-10 MED ORDER — SUMATRIPTAN SUCCINATE 50 MG PO TABS
ORAL_TABLET | ORAL | 0 refills | Status: DC
  Filled 2021-04-10: qty 9, 30d supply, fill #0

## 2021-04-10 MED ORDER — CYCLOBENZAPRINE HCL 5 MG PO TABS
5.0000 mg | ORAL_TABLET | Freq: Three times a day (TID) | ORAL | 1 refills | Status: DC | PRN
  Filled 2021-04-10: qty 30, 10d supply, fill #0
  Filled 2021-05-29: qty 30, 10d supply, fill #1

## 2021-04-11 ENCOUNTER — Other Ambulatory Visit: Payer: Self-pay

## 2021-04-16 ENCOUNTER — Other Ambulatory Visit: Payer: Self-pay

## 2021-04-17 ENCOUNTER — Other Ambulatory Visit: Payer: Self-pay

## 2021-05-29 ENCOUNTER — Other Ambulatory Visit: Payer: Self-pay

## 2021-06-01 ENCOUNTER — Other Ambulatory Visit: Payer: Self-pay

## 2021-06-08 ENCOUNTER — Other Ambulatory Visit: Payer: Self-pay

## 2021-06-08 ENCOUNTER — Ambulatory Visit: Payer: Self-pay | Attending: Nurse Practitioner | Admitting: Nurse Practitioner

## 2021-06-08 ENCOUNTER — Encounter: Payer: Self-pay | Admitting: Nurse Practitioner

## 2021-06-08 VITALS — BP 124/81 | HR 68 | Ht 61.0 in | Wt 156.0 lb

## 2021-06-08 DIAGNOSIS — M62838 Other muscle spasm: Secondary | ICD-10-CM

## 2021-06-08 DIAGNOSIS — Z862 Personal history of diseases of the blood and blood-forming organs and certain disorders involving the immune mechanism: Secondary | ICD-10-CM

## 2021-06-08 DIAGNOSIS — G43709 Chronic migraine without aura, not intractable, without status migrainosus: Secondary | ICD-10-CM

## 2021-06-08 DIAGNOSIS — M2550 Pain in unspecified joint: Secondary | ICD-10-CM

## 2021-06-08 DIAGNOSIS — E785 Hyperlipidemia, unspecified: Secondary | ICD-10-CM

## 2021-06-08 DIAGNOSIS — J3489 Other specified disorders of nose and nasal sinuses: Secondary | ICD-10-CM

## 2021-06-08 DIAGNOSIS — K589 Irritable bowel syndrome without diarrhea: Secondary | ICD-10-CM

## 2021-06-08 DIAGNOSIS — R053 Chronic cough: Secondary | ICD-10-CM

## 2021-06-08 DIAGNOSIS — Z9109 Other allergy status, other than to drugs and biological substances: Secondary | ICD-10-CM

## 2021-06-08 MED ORDER — SUMATRIPTAN SUCCINATE 50 MG PO TABS
ORAL_TABLET | ORAL | 6 refills | Status: DC
  Filled 2021-06-08 – 2021-06-29 (×2): qty 12, 30d supply, fill #0
  Filled 2021-09-14: qty 12, 30d supply, fill #1
  Filled 2021-10-16: qty 12, 30d supply, fill #0
  Filled 2021-10-16: qty 12, 30d supply, fill #2
  Filled 2021-11-29 – 2021-12-04 (×2): qty 12, 30d supply, fill #0
  Filled 2022-03-01: qty 12, 30d supply, fill #1
  Filled 2022-04-03: qty 12, 30d supply, fill #2

## 2021-06-08 MED ORDER — DICLOFENAC SODIUM 1 % EX GEL
2.0000 g | Freq: Four times a day (QID) | CUTANEOUS | 1 refills | Status: AC
  Filled 2021-06-08: qty 100, 12d supply, fill #0

## 2021-06-08 MED ORDER — SUMATRIPTAN SUCCINATE 50 MG PO TABS
ORAL_TABLET | ORAL | 3 refills | Status: DC
  Filled 2021-06-08: qty 9, 30d supply, fill #0

## 2021-06-08 MED ORDER — CYCLOBENZAPRINE HCL 5 MG PO TABS
5.0000 mg | ORAL_TABLET | Freq: Three times a day (TID) | ORAL | 2 refills | Status: DC | PRN
  Filled 2021-06-08 – 2022-03-01 (×3): qty 60, 20d supply, fill #0

## 2021-06-08 MED ORDER — DICYCLOMINE HCL 10 MG PO CAPS
10.0000 mg | ORAL_CAPSULE | Freq: Three times a day (TID) | ORAL | 2 refills | Status: DC
  Filled 2021-06-08: qty 50, 13d supply, fill #0

## 2021-06-08 MED ORDER — CETIRIZINE HCL 10 MG PO TABS
ORAL_TABLET | Freq: Every day | ORAL | 11 refills | Status: DC
  Filled 2021-06-08: qty 30, fill #0
  Filled 2022-03-01: qty 30, 30d supply, fill #0
  Filled 2022-04-03: qty 30, 30d supply, fill #1

## 2021-06-08 MED ORDER — FLUTICASONE PROPIONATE 50 MCG/ACT NA SUSP
1.0000 | Freq: Every day | NASAL | 6 refills | Status: DC
  Filled 2021-06-08: qty 16, 30d supply, fill #0

## 2021-06-08 MED ORDER — BENZONATATE 100 MG PO CAPS
100.0000 mg | ORAL_CAPSULE | Freq: Two times a day (BID) | ORAL | 1 refills | Status: DC | PRN
Start: 2021-06-08 — End: 2022-11-08
  Filled 2021-06-08 – 2021-11-29 (×2): qty 20, 10d supply, fill #0
  Filled 2021-11-29: qty 20, 10d supply, fill #1

## 2021-06-08 NOTE — Progress Notes (Signed)
Assessment & Plan:  Rhonda Graves was seen today for medication refill.  Diagnoses and all orders for this visit:  Chronic migraine without aura without status migrainosus, not intractable -     Discontinue: SUMAtriptan (IMITREX) 50 MG tablet; TAKE 1 TABLET BY MOUTH AT ONSET OF HEADACHE. MAY REPEAT IN 2 HOURS IF HEADACHE PERSISTS OR RECURS. -     Ambulatory referral to ENT -     SUMAtriptan (IMITREX) 50 MG tablet; TAKE 1 TABLET BY MOUTH AT ONSET OF HEADACHE. MAY REPEAT IN 2 HOURS IF HEADACHE PERSISTS OR RECURS.  History of anemia -     CMP14+EGFR -     CBC  Dyslipidemia, goal LDL below 100 -     Lipid panel  Environmental allergies -     cetirizine (ZYRTEC) 10 MG tablet; Take 1 (one) tablet by mouth daily -     fluticasone (FLONASE) 50 MCG/ACT nasal spray; PLACE 1 SPRAY INTO BOTH NOSTRILS DAILY.  Muscle spasms of neck -     cyclobenzaprine (FLEXERIL) 5 MG tablet; Take 1 tablet (5 mg total) by mouth 3 (three) times daily as needed for muscle spasms.  Arthralgia of multiple sites -     diclofenac Sodium (VOLTAREN) 1 % GEL; APPLY 2 G TOPICALLY 4 (FOUR) TIMES DAILY.  Sinus pressure -     Ambulatory referral to ENT  Irritable bowel syndrome, unspecified type -     dicyclomine (BENTYL) 10 MG capsule; Take 1 capsule (10 mg total) by mouth 4 (four) times daily -  before meals and at bedtime.  Chronic cough -     benzonatate (TESSALON) 100 MG capsule; Take 1 capsule (100 mg total) by mouth 2 (two) times daily as needed for cough.   Patient has been counseled on age-appropriate routine health concerns for screening and prevention. These are reviewed and up-to-date. Referrals have been placed accordingly. Immunizations are up-to-date or declined.    Subjective:   Chief Complaint  Patient presents with   Medication Refill   HPI Rhonda Graves 40 y.o. female presents to office today for medication refills.  She has a past medical history of Abnormal Pap smear, GERD (Dx 2010),  Headache(784.0), History of oral herpes simplex infection (05/29/2017), IBS, and Multiple allergies.   She is requesting refill of imitrex, tessalon, dicyclomine,  cyclobenzaprine, zyrtec and flonase. Denies any current symptoms aside from sinus pressure today but would like refills available at the pharmacy in case she needs them.   She states despite taking zyrtec and flonase she continues with sinus and periorbital pressure. Pressure is constant. She denies PND, foul breath or purulent sinus drainage.    Review of Systems  Constitutional:  Negative for fever, malaise/fatigue and weight loss.  HENT:  Positive for congestion. Negative for nosebleeds.   Eyes: Negative.  Negative for blurred vision, double vision and photophobia.  Respiratory: Negative.  Negative for cough and shortness of breath.   Cardiovascular: Negative.  Negative for chest pain, palpitations and leg swelling.  Gastrointestinal: Negative.  Negative for heartburn, nausea and vomiting.  Musculoskeletal: Negative.  Negative for myalgias.  Neurological:  Positive for headaches. Negative for dizziness, focal weakness and seizures.  Psychiatric/Behavioral: Negative.  Negative for suicidal ideas.    Past Medical History:  Diagnosis Date   Abnormal Pap smear    had colpo in past, normal pap since then   GERD (gastroesophageal reflux disease) Dx 2010   Headache(784.0)    migraine   History of oral herpes simplex infection 05/29/2017   +  IgM 1/2 antibody screen done for cold sores vs aphthous ulcers   IBS (irritable bowel syndrome)    Multiple allergies     Past Surgical History:  Procedure Laterality Date   CESAREAN SECTION N/A 11/26/2013   Procedure: CESAREAN SECTION;  Surgeon: Lavonia Drafts, MD;  Location: [facility];  Service: Obstetrics;  Laterality: N/A;   CESAREAN SECTION N/A 08/17/2017   Procedure: CESAREAN SECTION;  Surgeon: Jonnie Kind, MD;  Location: [facility];  Service: Obstetrics;   Laterality: N/A;    Family History  Problem Relation Age of Onset   Hypothyroidism Mother    Arthritis Mother    Asthma Brother    Allergic rhinitis Brother     Social History Reviewed with no changes to be made today.   Outpatient Medications Prior to Visit  Medication Sig Dispense Refill   EPINEPHrine 0.3 mg/0.3 mL IJ SOAJ injection Inject 0.3 mg into the muscle as needed. 2 each 2   cetirizine (ZYRTEC) 10 MG tablet Take 1 (one) tablet by mouth daily 30 tablet 2   cyclobenzaprine (FLEXERIL) 5 MG tablet Take 1 tablet (5 mg total) by mouth 3 (three) times daily as needed for muscle spasms. 30 tablet 1   diclofenac Sodium (VOLTAREN) 1 % GEL APPLY 2 G TOPICALLY 4 (FOUR) TIMES DAILY. 100 g 1   fluticasone (FLONASE) 50 MCG/ACT nasal spray PLACE 1 SPRAY INTO BOTH NOSTRILS DAILY. 16 g 6   SUMAtriptan (IMITREX) 50 MG tablet TAKE 1 TABLET BY MOUTH AT ONSET OF HEADACHE. MAY REPEAT IN 2 HOURS IF HEADACHE PERSISTS OR RECURS. 9 tablet 0   traZODone (DESYREL) 50 MG tablet Take 1 tablet (50 mg total) by mouth at bedtime as needed. for sleep 30 tablet 2   valACYclovir (VALTREX) 1000 MG tablet Take 2 tablets (2,000 mg total) by mouth 2 (two) times daily. For two day (Patient not taking: No sig reported) 8 tablet 6   No facility-administered medications prior to visit.    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 Uncertain what caused reaction.   Lomotil [Diphenoxylate] Itching and Swelling    Had allergic reaction after Lomotil, Lysteda and Hemabate. Uncertain what caused reaction.   Lysteda [Tranexamic Acid] Itching and Swelling    Had allergic reaction after Lomotil, Lysteda and Hemabate. Uncertain what caused reaction.       Objective:    BP 124/81   Pulse 68   Ht $R'5\' 1"'mq$   (1.549 m)   Wt 156 lb (70.8 kg)   LMP 05/26/2021 (Exact Date)   SpO2 97%   BMI 29.48 kg/m  Wt Readings from Last 3 Encounters:  06/08/21 156 lb (70.8 kg)  06/16/20 153 lb (69.4 kg)  09/27/19 150 lb (68 kg)    Physical Exam Vitals and nursing note reviewed.  Constitutional:      Appearance: She is well-developed.  HENT:     Head: Normocephalic and atraumatic.  Cardiovascular:     Rate and Rhythm: Normal rate and regular rhythm.     Heart sounds: Normal heart sounds. No murmur heard.   No friction rub. No gallop.  Pulmonary:     Effort: Pulmonary effort is normal. No tachypnea or respiratory distress.     Breath sounds: Normal breath sounds. No decreased breath sounds, wheezing, rhonchi or  rales.  Chest:     Chest wall: No tenderness.  Abdominal:     General: Bowel sounds are normal.     Palpations: Abdomen is soft.  Musculoskeletal:        General: Normal range of motion.     Cervical back: Normal range of motion.  Skin:    General: Skin is warm and dry.  Neurological:     Mental Status: She is alert and oriented to person, place, and time.     Coordination: Coordination normal.  Psychiatric:        Behavior: Behavior normal. Behavior is cooperative.        Thought Content: Thought content normal.        Judgment: Judgment normal.         Patient has been counseled extensively about nutrition and exercise as well as the importance of adherence with medications and regular follow-up. The patient was given clear instructions to go to ER or return to medical center if symptoms don't improve, worsen or new problems develop. The patient verbalized understanding.   Follow-up: Return in about 6 months (around 12/06/2021).   Gildardo Pounds, FNP-BC [facility], [place]   06/08/2021, 4:41 PM

## 2021-06-11 ENCOUNTER — Other Ambulatory Visit: Payer: Self-pay

## 2021-06-12 ENCOUNTER — Other Ambulatory Visit: Payer: Self-pay

## 2021-06-13 LAB — CMP14+EGFR
ALT: 28 IU/L (ref 0–32)
AST: 28 IU/L (ref 0–40)
Albumin/Globulin Ratio: 1.8 (ref 1.2–2.2)
Albumin: 4.4 g/dL (ref 3.8–4.8)
Alkaline Phosphatase: 80 IU/L (ref 44–121)
BUN/Creatinine Ratio: 13 (ref 9–23)
BUN: 10 mg/dL (ref 6–20)
Bilirubin Total: 0.3 mg/dL (ref 0.0–1.2)
CO2: 23 mmol/L (ref 20–29)
Calcium: 9.6 mg/dL (ref 8.7–10.2)
Chloride: 102 mmol/L (ref 96–106)
Creatinine, Ser: 0.75 mg/dL (ref 0.57–1.00)
Globulin, Total: 2.4 g/dL (ref 1.5–4.5)
Glucose: 85 mg/dL (ref 65–99)
Potassium: 4.4 mmol/L (ref 3.5–5.2)
Sodium: 139 mmol/L (ref 134–144)
Total Protein: 6.8 g/dL (ref 6.0–8.5)
eGFR: 104 mL/min/{1.73_m2} (ref >59)

## 2021-06-13 LAB — LIPID PANEL
Chol/HDL Ratio: 2.4 ratio (ref 0.0–4.4)
Cholesterol, Total: 225 mg/dL — ABNORMAL HIGH (ref 100–199)
HDL: 93 mg/dL (ref >39)
LDL Chol Calc (NIH): 121 mg/dL — ABNORMAL HIGH (ref 0–99)
Triglycerides: 66 mg/dL (ref 0–149)
VLDL Cholesterol Cal: 11 mg/dL (ref 5–40)

## 2021-06-13 LAB — CBC
Hematocrit: 39 % (ref 34.0–46.6)
Hemoglobin: 12.3 g/dL (ref 11.1–15.9)
MCH: 30.5 pg (ref 26.6–33.0)
MCHC: 31.5 g/dL (ref 31.5–35.7)
MCV: 97 fL (ref 79–97)
Platelets: 233 10*3/uL (ref 150–450)
RBC: 4.03 x10E6/uL (ref 3.77–5.28)
RDW: 11.9 % (ref 11.7–15.4)
WBC: 5.1 10*3/uL (ref 3.4–10.8)

## 2021-06-15 ENCOUNTER — Other Ambulatory Visit: Payer: Self-pay

## 2021-06-29 ENCOUNTER — Other Ambulatory Visit: Payer: Self-pay

## 2021-07-03 ENCOUNTER — Other Ambulatory Visit: Payer: Self-pay

## 2021-09-14 ENCOUNTER — Other Ambulatory Visit: Payer: Self-pay

## 2021-10-16 ENCOUNTER — Other Ambulatory Visit: Payer: Self-pay

## 2021-10-17 ENCOUNTER — Other Ambulatory Visit: Payer: Self-pay

## 2021-11-13 ENCOUNTER — Encounter: Payer: Self-pay | Admitting: Nurse Practitioner

## 2021-11-23 ENCOUNTER — Ambulatory Visit: Payer: Self-pay | Admitting: Nurse Practitioner

## 2021-11-26 ENCOUNTER — Encounter: Payer: Self-pay | Admitting: Nurse Practitioner

## 2021-11-26 ENCOUNTER — Ambulatory Visit: Payer: Self-pay

## 2021-11-26 NOTE — Telephone Encounter (Signed)
? ? ? ?  Chief Complaint: Upper abdominal pain. States has a history of ulcer. ?Symptoms: Pain  8/10 ?Frequency: This weekend ?Pertinent Negatives: Patient denies vomiting ?Disposition: '[]'$ ED /'[]'$ Urgent Care (no appt availability in office) / '[]'$ Appointment(In office/virtual)/ '[]'$  [facility]/ '[]'$ Home Care/ '[]'$ Refused Recommended Disposition /'[]'$ [place] [facility]/ '[]'$  Follow-up with PCP ?Additional Notes: Would like to be worked in today. Please advise.  ?Answer Assessment - Initial Assessment Questions ?1. LOCATION: "Where does it hurt?"  ?    Upper ?2. RADIATION: "Does the pain shoot anywhere else?" (e.g., chest, back) ?     No ?3. ONSET: "When did the pain begin?" (e.g., minutes, hours or days ago)  ?    Yesterday ?4. SUDDEN: "Gradual or sudden onset?" ?    Sudden ?5. PATTERN "Does the pain come and go, or is it constant?" ?   - If constant: "Is it getting better, staying the same, or worsening?"  ?    (Note: Constant means the pain never goes away completely; most serious pain is constant and it progresses)  ?   - If intermittent: "How long does it last?" "Do you have pain now?" ?    (Note: Intermittent means the pain goes away completely between bouts) ?    Constant ?6. SEVERITY: "How bad is the pain?"  (e.g., Scale 1-10; mild, moderate, or severe) ?  - MILD (1-3): doesn't interfere with normal activities, abdomen soft and not tender to touch  ?  - MODERATE (4-7): interferes with normal activities or awakens from sleep, abdomen tender to touch  ?  - SEVERE (8-10): excruciating pain, doubled over, unable to do any normal activities  ?    8 ?7. RECURRENT SYMPTOM: "Have you ever had this type of stomach pain before?" If Yes, ask: "When was the last time?" and "What happened that time?"  ?    Yes - 10 years ago had an ulcer ?8. CAUSE: "What do you think is causing the stomach pain?" ?    Unsure ?9. RELIEVING/AGGRAVATING FACTORS: "What makes it better or worse?" (e.g., movement, antacids, bowel movement) ?     No ?10. OTHER SYMPTOMS: "Do you have any other symptoms?" (e.g., back pain, diarrhea, fever, urination pain, vomiting) ?      No ?11. PREGNANCY: "Is there any chance you are pregnant?" "When was your last menstrual period?" ?      No ? ?Protocols used: Abdominal Pain - Female-A-AH ? ?

## 2021-11-26 NOTE — Telephone Encounter (Signed)
Schedule MyChart video visit in the am with PCP. Tomorrow at [phone] ?

## 2021-11-27 ENCOUNTER — Encounter: Payer: Self-pay | Admitting: Nurse Practitioner

## 2021-11-27 ENCOUNTER — Ambulatory Visit: Payer: Self-pay | Attending: Nurse Practitioner | Admitting: Nurse Practitioner

## 2021-11-27 ENCOUNTER — Other Ambulatory Visit: Payer: Self-pay

## 2021-11-27 DIAGNOSIS — R1013 Epigastric pain: Secondary | ICD-10-CM

## 2021-11-27 DIAGNOSIS — K589 Irritable bowel syndrome without diarrhea: Secondary | ICD-10-CM

## 2021-11-27 MED ORDER — DICYCLOMINE HCL 10 MG PO CAPS
10.0000 mg | ORAL_CAPSULE | Freq: Three times a day (TID) | ORAL | 2 refills | Status: DC
Start: 2021-11-27 — End: 2022-11-08
  Filled 2021-11-27: qty 50, 13d supply, fill #0

## 2021-11-27 MED ORDER — OMEPRAZOLE 40 MG PO CPDR
40.0000 mg | DELAYED_RELEASE_CAPSULE | Freq: Every day | ORAL | 3 refills | Status: DC
  Filled 2021-11-27: qty 30, 30d supply, fill #0

## 2021-11-27 NOTE — Progress Notes (Signed)
Virtual Visit via Telephone Note ?Due to national recommendations of social distancing due to [place] 19, telehealth visit is felt to be most appropriate for this patient at this time.  I discussed the limitations, risks, security and privacy concerns of performing an evaluation and management service by telephone and the availability of in person appointments. I also discussed with the patient that there may be a patient responsible charge related to this service. The patient expressed understanding and agreed to proceed.  ? ? ?I connected with Rayssa Ficek on 11/27/21  at   8:10 AM EST  EDT by telephone and verified that I am speaking with the correct person using two identifiers. ? ?Location of Patient: ?Private Residence ?  ?Location of Provider: ?Scientist, research (physical sciences) and [employer] Office  ?  ?Persons participating in Telemedicine visit: ?Geryl Rankins FNP-BC ?Tranise Adolf  ?  ?History of Present Illness: ?Telemedicine visit for: EPIGASTRIC PAIN ? ?She has a history of GERD and IBS (no longer taking levsin and states she does not remember ever starting this) ? ?She complains of  epigastric and upper abdominal pain. Worse with eating. She took her husband's esomerpazole yeserday and states today the pain has slightly eased off.  Pain has been associated with chest pain, deep pressure at base of neck, heartburn, midespigastric pain, and symptoms primarily related to meals .  She denies choking on food, hematemesis, hoarseness, melena, regurgitation of undigested food, and shortness of breath. Symptoms have been present for several weeks however she also states she caught the "stomach flu" back to back last month.   Medical therapy in the past has included antacids, H2 antagonists, proton pump inhibitors, and antispasmodics . She states current pain is dissimilar to previous IBS pain.  ? ? ? ?Past Medical History:  ?Diagnosis Date  ? Abnormal Pap smear   ? had colpo in past, normal pap since then  ? GERD  (gastroesophageal reflux disease) Dx 2010  ? Headache(784.0)   ? migraine  ? History of oral herpes simplex infection 05/29/2017  ? +IgM 1/2 antibody screen done for cold sores vs aphthous ulcers  ? IBS (irritable bowel syndrome)   ? Multiple allergies   ?  ?Past Surgical History:  ?Procedure Laterality Date  ? CESAREAN SECTION N/A 11/26/2013  ? Procedure: CESAREAN SECTION;  Surgeon: Lavonia Drafts, MD;  Location: [facility];  Service: Obstetrics;  Laterality: N/A;  ? CESAREAN SECTION N/A 08/17/2017  ? Procedure: CESAREAN SECTION;  Surgeon: Jonnie Kind, MD;  Location: [facility];  Service: Obstetrics;  Laterality: N/A;  ?  ?Family History  ?Problem Relation Age of Onset  ? Hypothyroidism Mother   ? Arthritis Mother   ? Asthma Brother   ? Allergic rhinitis Brother   ?  ?Social History  ? ?Socioeconomic History  ? Marital status: Married  ?  Spouse name: Not on file  ? Number of children: 1   ? Years of education: college   ? Highest education level: Not on file  ?Occupational History  ? Occupation: Interpreter   ?Tobacco Use  ? Smoking status: Never  ? Smokeless tobacco: Never  ?Vaping Use  ? Vaping Use: Never used  ?Substance and Sexual Activity  ? Alcohol use: Yes  ?  Comment: occasionally   ? Drug use: No  ? Sexual activity: Yes  ?  Birth control/protection: None  ?Other Topics Concern  ? Not on file  ?Social History Narrative  ? Originally from [place].  ? Has one daughter.  ?  Lives with husband and daughter.   ? ?Social Determinants of Health  ? ?Financial Resource Strain: Not on file  ?Food Insecurity: Not on file  ?Transportation Needs: Not on file  ?Physical Activity: Not on file  ?Stress: Not on file  ?Social Connections: Not on file  ?  ? ?Observations/Objective: ?Awake, alert and oriented x 3 ? ? ?Review of Systems  ?Constitutional:  Negative for fever, malaise/fatigue and weight loss.  ?HENT: Negative.  Negative for nosebleeds.   ?Eyes: Negative.  Negative for blurred vision, double vision  and photophobia.  ?Respiratory: Negative.  Negative for cough and shortness of breath.   ?Cardiovascular: Negative.  Negative for chest pain, palpitations and leg swelling.  ?Gastrointestinal:  Positive for abdominal pain and heartburn. Negative for blood in stool, constipation, diarrhea, melena, nausea and vomiting.  ?Musculoskeletal: Negative.  Negative for myalgias.  ?Neurological: Negative.  Negative for dizziness, focal weakness, seizures and headaches.  ?Psychiatric/Behavioral: Negative.  Negative for suicidal ideas.    ?Assessment and Plan: ?Diagnoses and all orders for this visit: ? ?Epigastric pain ?-     H. pylori breath test; Future ?-     omeprazole (PRILOSEC) 40 MG capsule; Take 1 capsule (40 mg total) by mouth daily. ? ?Irritable bowel syndrome, unspecified type ?-     dicyclomine (BENTYL) 10 MG capsule; Take 1 capsule (10 mg total) by mouth 4 (four) times daily -  before meals and at bedtime. ? ?  ? ?Follow Up Instructions ?Return in about 2 weeks (around 12/11/2021) for EPIGASTRIC PAIN.  ? ?  ?I discussed the assessment and treatment plan with the patient. The patient was provided an opportunity to ask questions and all were answered. The patient agreed with the plan and demonstrated an understanding of the instructions. ?  ?The patient was advised to call back or seek an in-person evaluation if the symptoms worsen or if the condition fails to improve as anticipated. ? ?I provided 11 minutes of non-face-to-face time during this encounter including median intraservice time, reviewing previous notes, labs, imaging, medications and explaining diagnosis and management. ? ?Gildardo Pounds, FNP-BC  ?

## 2021-11-29 ENCOUNTER — Other Ambulatory Visit: Payer: Self-pay

## 2021-12-03 ENCOUNTER — Other Ambulatory Visit: Payer: Self-pay

## 2021-12-04 ENCOUNTER — Other Ambulatory Visit: Payer: Self-pay

## 2021-12-06 ENCOUNTER — Other Ambulatory Visit: Payer: Self-pay

## 2021-12-06 ENCOUNTER — Telehealth: Payer: Self-pay | Admitting: Allergy

## 2021-12-06 NOTE — Telephone Encounter (Signed)
Please call patient to schedule appointment. She has not been seen for over 3 years so she would be a new patient visit. ? ?Patient called stating:  ?she had a migraine this afternoon. ? ?Then she noted some warmness of her cheeks and it was red.  ? ?She feels like that her face is on fire and noticed small itchy bumps. ? ?She took 2 benadryl and prednisone '30mg'$ .  ? ?Avoiding peanuts, tree nuts and peaches. ?She did not spend a lot of time outdoors but she did have her windows down while driving.  ? ?Increase zyrtec '10mg'$  to twice a day ?May take benadryl '50mg'$  every 4-6 hours as needed. ? ?If symptoms worsen or if you have severe symptoms including breathing issues, throat closure, significant swelling, whole body hives, severe diarrhea and vomiting, lightheadedness then inject epinephrine and seek immediate medical care afterwards. ? ?Take picture of the face. ? ?Call and make appointment for follow up. ? ?

## 2021-12-10 NOTE — Telephone Encounter (Signed)
Left message for patient to make new patient appointment.  ?

## 2021-12-11 ENCOUNTER — Telehealth: Payer: Self-pay | Admitting: Nurse Practitioner

## 2021-12-14 ENCOUNTER — Other Ambulatory Visit: Payer: Self-pay

## 2021-12-17 ENCOUNTER — Ambulatory Visit: Payer: Self-pay

## 2021-12-17 ENCOUNTER — Other Ambulatory Visit: Payer: Self-pay

## 2021-12-21 ENCOUNTER — Ambulatory Visit: Payer: Self-pay | Attending: Nurse Practitioner

## 2021-12-21 DIAGNOSIS — R1013 Epigastric pain: Secondary | ICD-10-CM

## 2021-12-22 LAB — H. PYLORI BREATH TEST: H pylori Breath Test: NEGATIVE

## 2022-01-04 ENCOUNTER — Encounter: Payer: Self-pay | Admitting: Nurse Practitioner

## 2022-03-01 ENCOUNTER — Encounter: Payer: Self-pay | Admitting: Nurse Practitioner

## 2022-03-01 ENCOUNTER — Other Ambulatory Visit: Payer: Self-pay | Admitting: Nurse Practitioner

## 2022-03-01 ENCOUNTER — Other Ambulatory Visit: Payer: Self-pay

## 2022-03-01 MED ORDER — TRAZODONE HCL 50 MG PO TABS
25.0000 mg | ORAL_TABLET | Freq: Every evening | ORAL | 3 refills | Status: DC | PRN
  Filled 2022-03-01: qty 30, 30d supply, fill #0
  Filled 2022-04-03: qty 30, 30d supply, fill #1
  Filled 2022-07-22: qty 30, 30d supply, fill #2
  Filled 2022-08-19: qty 30, 30d supply, fill #3

## 2022-04-03 ENCOUNTER — Other Ambulatory Visit: Payer: Self-pay

## 2022-04-04 ENCOUNTER — Other Ambulatory Visit: Payer: Self-pay

## 2022-04-05 ENCOUNTER — Other Ambulatory Visit: Payer: Self-pay

## 2022-04-10 ENCOUNTER — Other Ambulatory Visit: Payer: Self-pay

## 2022-04-12 ENCOUNTER — Other Ambulatory Visit: Payer: Self-pay

## 2022-04-12 ENCOUNTER — Other Ambulatory Visit: Payer: Self-pay | Admitting: Nurse Practitioner

## 2022-04-12 MED ORDER — EPINEPHRINE 0.3 MG/0.3ML IJ SOAJ
0.3000 mg | INTRAMUSCULAR | 0 refills | Status: DC | PRN
  Filled 2022-04-12: qty 2, 1d supply, fill #0

## 2022-04-12 NOTE — Telephone Encounter (Signed)
Requested medication (s) are due for refill today expired Rx  Requested medication (s) are on the active medication list yes  Future visit scheduled -no  Last refill: 04/10/21 2 each 2RF  Notes to clinic: no protocol listed for medication   Requested Prescriptions  Pending Prescriptions Disp Refills   EPINEPHrine 0.3 mg/0.3 mL IJ SOAJ injection 2 each 2    Sig: Inject 0.3 mg into the muscle as needed.     There is no refill protocol information for this order       Requested Prescriptions  Pending Prescriptions Disp Refills   EPINEPHrine 0.3 mg/0.3 mL IJ SOAJ injection 2 each 2    Sig: Inject 0.3 mg into the muscle as needed.     There is no refill protocol information for this order

## 2022-05-15 ENCOUNTER — Other Ambulatory Visit: Payer: Self-pay

## 2022-05-15 DIAGNOSIS — Z1231 Encounter for screening mammogram for malignant neoplasm of breast: Secondary | ICD-10-CM

## 2022-06-10 ENCOUNTER — Other Ambulatory Visit: Payer: Self-pay

## 2022-06-10 ENCOUNTER — Other Ambulatory Visit: Payer: Self-pay | Admitting: Nurse Practitioner

## 2022-06-10 DIAGNOSIS — M2550 Pain in unspecified joint: Secondary | ICD-10-CM

## 2022-06-11 NOTE — Telephone Encounter (Signed)
Requested medication (s) are due for refill today - expired Rx  Requested medication (s) are on the active medication list -no  Future visit scheduled -no  Last refill: 06/08/21  Notes to clinic: expired rx, no longer on current medication list  Requested Prescriptions  Pending Prescriptions Disp Refills   diclofenac Sodium (VOLTAREN) 1 % GEL 100 g 1    Sig: APPLY 2 G TOPICALLY 4 (FOUR) TIMES DAILY.     Analgesics:  Topicals Failed - 06/10/2022 12:35 PM      Failed - Manual Review: Labs are only required if the patient has taken medication for more than 8 weeks.      Failed - PLT in normal range and within 360 days    Platelets  Date Value Ref Range Status  06/12/2021 233 150 - 450 x10E3/uL Final         Failed - HGB in normal range and within 360 days    Hemoglobin  Date Value Ref Range Status  06/12/2021 12.3 11.1 - 15.9 g/dL Final   Hemoglobin Other  Date Value Ref Range Status  04/08/2013 0.0 0.0 % Final    Comment:      Interpretation -------------- Normal study.   Reviewed by Dallas Breeding, MD, PhD, FCAP (Electronic Signature on File)         Failed - HCT in normal range and within 360 days    Hematocrit  Date Value Ref Range Status  06/12/2021 39.0 34.0 - 46.6 % Final         Failed - Cr in normal range and within 360 days    Creat  Date Value Ref Range Status  02/02/2016 0.64 0.50 - 1.10 mg/dL Final   Creatinine, Ser  Date Value Ref Range Status  06/12/2021 0.75 0.57 - 1.00 mg/dL Final   Creatinine, Urine  Date Value Ref Range Status  02/13/2014 59.85 mg/dL Final         Failed - eGFR is 30 or above and within 360 days    GFR, Est African American  Date Value Ref Range Status  02/02/2016 >89 >=60 mL/min Final   GFR calc Af Amer  Date Value Ref Range Status  06/16/2020 113 >59 mL/min/1.73 Final    Comment:    **[facility] currently reports eGFR in compliance with the current**   recommendations of the [employer]. [facility]  will   update reporting as new guidelines are published from the NKF-ASN   Task force.    GFR, Est Non African American  Date Value Ref Range Status  02/02/2016 >89 >=60 mL/min Final    Comment:      The estimated GFR is a calculation valid for adults (>=12 years old) that uses the CKD-EPI algorithm to adjust for age and sex. It is   not to be used for children, pregnant women, hospitalized patients,    patients on dialysis, or with rapidly changing kidney function. According to the NKDEP, eGFR >89 is normal, 60-89 shows mild impairment, 30-59 shows moderate impairment, 15-29 shows severe impairment and <15 is ESRD.      GFR calc non Af Amer  Date Value Ref Range Status  06/16/2020 98 >59 mL/min/1.73 Final   eGFR  Date Value Ref Range Status  06/12/2021 104 >59 mL/min/1.73 Final         Passed - Patient is not pregnant      Passed - Valid encounter within last 12 months    Recent Outpatient Visits  6 months ago Epigastric pain   [place], [place] W, NP   1 year ago Chronic migraine without aura without status migrainosus, not intractable   [place], Vernia Buff, NP   1 year ago Need for influenza vaccination   [place], RPH-CPP   1 year ago Encounter for Papanicolaou smear for cervical cancer screening   [place], Vernia Buff, NP   2 years ago Muscle spasms of neck   [place], Vernia Buff, NP                 Requested Prescriptions  Pending Prescriptions Disp Refills   diclofenac Sodium (VOLTAREN) 1 % GEL 100 g 1    Sig: APPLY 2 G TOPICALLY 4 (FOUR) TIMES DAILY.     Analgesics:  Topicals Failed - 06/10/2022 12:35 PM      Failed - Manual Review: Labs are only required if the patient has taken medication for more than 8 weeks.      Failed - PLT  in normal range and within 360 days    Platelets  Date Value Ref Range Status  06/12/2021 233 150 - 450 x10E3/uL Final         Failed - HGB in normal range and within 360 days    Hemoglobin  Date Value Ref Range Status  06/12/2021 12.3 11.1 - 15.9 g/dL Final   Hemoglobin Other  Date Value Ref Range Status  04/08/2013 0.0 0.0 % Final    Comment:      Interpretation -------------- Normal study.   Reviewed by Odis Hollingshead, MD, PhD, FCAP (Electronic Signature on File)         Failed - HCT in normal range and within 360 days    Hematocrit  Date Value Ref Range Status  06/12/2021 39.0 34.0 - 46.6 % Final         Failed - Cr in normal range and within 360 days    Creat  Date Value Ref Range Status  02/02/2016 0.64 0.50 - 1.10 mg/dL Final   Creatinine, Ser  Date Value Ref Range Status  06/12/2021 0.75 0.57 - 1.00 mg/dL Final   Creatinine, Urine  Date Value Ref Range Status  02/13/2014 59.85 mg/dL Final         Failed - eGFR is 30 or above and within 360 days    GFR, Est African American  Date Value Ref Range Status  02/02/2016 >89 >=60 mL/min Final   GFR calc Af Amer  Date Value Ref Range Status  06/16/2020 113 >59 mL/min/1.73 Final    Comment:    **[facility] currently reports eGFR in compliance with the current**   recommendations of the [employer]. [facility] will   update reporting as new guidelines are published from the NKF-ASN   Task force.    GFR, Est Non African American  Date Value Ref Range Status  02/02/2016 >89 >=60 mL/min Final    Comment:      The estimated GFR is a calculation valid for adults (>=64 years old) that uses the CKD-EPI algorithm to adjust for age and sex. It is   not to be used for children, pregnant women, hospitalized patients,    patients on dialysis, or with rapidly changing kidney function. According to the NKDEP, eGFR >89 is normal, 60-89 shows mild impairment, 30-59  shows moderate impairment, 15-29 shows  severe impairment and <15 is ESRD.      GFR calc non Af Amer  Date Value Ref Range Status  06/16/2020 98 >59 mL/min/1.73 Final   eGFR  Date Value Ref Range Status  06/12/2021 104 >59 mL/min/1.73 Final         Passed - Patient is not pregnant      Passed - Valid encounter within last 12 months    Recent Outpatient Visits           6 months ago Epigastric pain   [place], [place] W, NP   1 year ago Chronic migraine without aura without status migrainosus, not intractable   [place], Vernia Buff, NP   1 year ago Need for influenza vaccination   [place], RPH-CPP   1 year ago Encounter for Papanicolaou smear for cervical cancer screening   [place], Vernia Buff, NP   2 years ago Muscle spasms of neck   [place], Vernia Buff, NP

## 2022-06-12 ENCOUNTER — Ambulatory Visit: Payer: Self-pay

## 2022-06-12 ENCOUNTER — Other Ambulatory Visit: Payer: Self-pay

## 2022-06-12 ENCOUNTER — Telehealth: Payer: Self-pay | Admitting: Emergency Medicine

## 2022-06-12 DIAGNOSIS — L299 Pruritus, unspecified: Secondary | ICD-10-CM

## 2022-06-12 MED ORDER — PREDNISONE 20 MG PO TABS
40.0000 mg | ORAL_TABLET | Freq: Every day | ORAL | 0 refills | Status: DC
  Filled 2022-06-12: qty 10, 5d supply, fill #0

## 2022-06-12 NOTE — Progress Notes (Signed)
Virtual Visit Consent   Rhonda Graves, you are scheduled for a virtual visit with a [facility] provider today. Just as with appointments in the office, your consent must be obtained to participate. Your consent will be active for this visit and any virtual visit you may have with one of our providers in the next 365 days. If you have a MyChart account, a copy of this consent can be sent to you electronically.  As this is a virtual visit, video technology does not allow for your provider to perform a traditional examination. This may limit your provider's ability to fully assess your condition. If your provider identifies any concerns that need to be evaluated in person or the need to arrange testing (such as labs, EKG, etc.), we will make arrangements to do so. Although advances in technology are sophisticated, we cannot ensure that it will always work on either your end or our end. If the connection with a video visit is poor, the visit may have to be switched to a telephone visit. With either a video or telephone visit, we are not always able to ensure that we have a secure connection.  By engaging in this virtual visit, you consent to the provision of healthcare and authorize for your insurance to be billed (if applicable) for the services provided during this visit. Depending on your insurance coverage, you may receive a charge related to this service.  I need to obtain your verbal consent now. Are you willing to proceed with your visit today? Rhonda Graves has provided verbal consent on 06/12/2022 for a virtual visit (video or telephone). Rhonda Circle, PA-C  Date: 06/12/2022 11:28 AM  Virtual Visit via Video Note   I, Rhonda Graves, connected with  Rhonda Graves  ([id number][id number]) on 06/12/22 at 11:30 AM EDT by a video-enabled telemedicine application and verified that I am speaking with the correct person using two identifiers.  Location: Patient: Virtual Visit Location Patient:  Home Provider: Virtual Visit Location Provider: Home   I discussed the limitations of evaluation and management by telemedicine and the availability of in person appointments. The patient expressed understanding and agreed to proceed.    History of Present Illness: Rhonda Graves is a 41 y.o. who identifies as a female who was assigned female at birth, and is being seen today for itching.  She states that she has been waking up in the middle of the night with itching.  She states that it is all over her body, but she has noticed any rash.  She states that she thought it might have been the sheets, but she washed those.  She states that it itches everyone.  She has used prednisone in the past with success.  Denies SOB, nausea, or associated diarrhea.  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 Uncertain what caused reaction.   Lomotil [Diphenoxylate] Itching and Swelling    Had allergic reaction after Lomotil,  Lysteda and Hemabate. Uncertain what caused reaction.   Lysteda [Tranexamic Acid] Itching and Swelling    Had allergic reaction after Lomotil, Lysteda and Hemabate. Uncertain what caused reaction.   Medications:  Current Outpatient Medications:    predniSONE (DELTASONE) 20 MG tablet, Take 2 tablets (40 mg total) by mouth daily., Disp: 10 tablet, Rfl: 0   benzonatate (TESSALON) 100 MG capsule, Take 1 capsule (100 mg total) by mouth 2 (two) times daily as needed for cough., Disp: 20 capsule, Rfl: 1   cetirizine (ZYRTEC) 10 MG tablet, Take 1 (one) tablet by mouth daily, Disp: 30 tablet, Rfl: 11   cyclobenzaprine (FLEXERIL) 5 MG tablet, Take 1 tablet (5 mg total) by mouth 3 (three) times daily as needed for muscle spasms., Disp: 60 tablet, Rfl: 2   dicyclomine (BENTYL) 10 MG capsule, Take 1 capsule (10 mg total) by mouth 4 (four) times daily -  before meals and at bedtime., Disp: 50 capsule, Rfl: 2   EPINEPHrine 0.3 mg/0.3 mL IJ SOAJ injection, Inject 0.3 mg into the muscle as needed., Disp: 2 each, Rfl: 0   fluticasone (FLONASE) 50 MCG/ACT nasal spray, PLACE 1 SPRAY INTO BOTH NOSTRILS DAILY., Disp: 16 g, Rfl: 6   omeprazole (PRILOSEC) 40 MG capsule, Take 1 capsule (40 mg total) by mouth daily., Disp: 30 capsule, Rfl: 3   SUMAtriptan (IMITREX) 50 MG tablet, TAKE 1 TABLET BY MOUTH AT ONSET OF HEADACHE. MAY REPEAT IN 2 HOURS IF HEADACHE PERSISTS OR RECURS., Disp: 12 tablet, Rfl: 6   traZODone (DESYREL) 50 MG tablet, Take 0.5-1 tablets (25-50 mg total) by mouth at bedtime as needed for sleep., Disp: 30 tablet, Rfl: 3  Observations/Objective: Patient is well-developed, well-nourished in no acute distress.  Resting comfortably  at home.  Head is normocephalic, atraumatic.  No labored breathing.  Speech is clear and coherent with logical content.  Patient is alert and oriented at baseline.  No visible rash  Assessment and Plan: 1. Pruritus  - trial prednisone - f/u with allergist  Follow Up  Instructions: I discussed the assessment and treatment plan with the patient. The patient was provided an opportunity to ask questions and all were answered. The patient agreed with the plan and demonstrated an understanding of the instructions.  A copy of instructions were sent to the patient via MyChart unless otherwise noted below.     The patient was advised to call back or seek an in-person evaluation if the symptoms worsen or if the condition fails to improve as anticipated.  Time:  I spent 13 minutes with the patient via telehealth technology discussing the above problems/concerns.    Rhonda Circle, PA-C

## 2022-06-12 NOTE — Telephone Encounter (Signed)
  Chief Complaint: itching Symptoms: itching from head to toe Frequency: 2 days Pertinent Negatives: Patient denies no red bumps, rash, hives Disposition: '[]'$ ED /'[]'$ Urgent Care (no appt availability in office) / '[]'$ Appointment(In office/virtual)/ '[x]'$  [facility]/ '[]'$ Home Care/ '[]'$ Refused Recommended Disposition /'[]'$ [facility]/ '[]'$  Follow-up with PCP Additional Notes: pt has been taking Benadryl but not helping, called Allergy provider but unable to get quick appt since been > 3 years since been there. Pt states she used to take prednisone when that happened previously but unsure what dose to take and wants to see provider. Advised no appts with practice but scheduled virtual UC appt today 1130 today.   Summary: Possible allergic reaction/Itching all over   Patient called in stating she has been itching all over. It has been waking her up at night. She says benedrly is not even helping. She has an appointment with an allergist on October 31st but needs some relief. Please assist patient further.      Reason for Disposition  [1] MODERATE-SEVERE widespread itching (i.e., interferes with sleep, normal activities or school) AND [2] not improved after 24 hours of itching Care Advice  Answer Assessment - Initial Assessment Questions 1. DESCRIPTION: "Describe the itching you are having."     Itching from head to toe 2. SEVERITY: "How bad is it?"    - MILD: Doesn't interfere with normal activities.   - MODERATE-SEVERE: Interferes with work, school, sleep, or other activities.      Moderate  4. ONSET: "When did this begin?"      2 nights  6. OTHER SYMPTOMS: "Do you have any other symptoms?"      No  Protocols used: Itching - [facility]

## 2022-06-14 ENCOUNTER — Other Ambulatory Visit: Payer: Self-pay

## 2022-06-21 ENCOUNTER — Other Ambulatory Visit: Payer: Self-pay

## 2022-07-02 ENCOUNTER — Ambulatory Visit
Admission: RE | Admit: 2022-07-02 | Discharge: 2022-07-02 | Disposition: A | Discharge status: Home / Self Care | Payer: No Typology Code available for payment source | Source: Ambulatory Visit | Attending: Obstetrics and Gynecology | Admitting: Obstetrics and Gynecology

## 2022-07-02 ENCOUNTER — Ambulatory Visit: Payer: Self-pay | Admitting: *Deleted

## 2022-07-02 VITALS — BP 120/82 | Wt 147.6 lb

## 2022-07-02 DIAGNOSIS — Z1239 Encounter for other screening for malignant neoplasm of breast: Secondary | ICD-10-CM

## 2022-07-02 DIAGNOSIS — Z1231 Encounter for screening mammogram for malignant neoplasm of breast: Secondary | ICD-10-CM

## 2022-07-02 NOTE — Patient Instructions (Signed)
Explained breast self awareness with Rhonda Graves. Patient did not need a Pap smear today due to last Pap smear and HPV typing was 06/16/2020. Let her know [facility] will cover Pap smears and HPV typing every 5 years unless has a history of abnormal Pap smears. Referred patient to the [facility] for a screening mammogram on mobile unit. Appointment scheduled Tuesday, July 02, 2022 at 1000. Patient aware of appointment and will be there. Let patient know the [facility] will follow up with her within the next couple weeks with results of mammogram by letter or phone. Rhonda Graves verbalized understanding.  Raynard Mapps, Arvil Ch[place], RN 9:18 AM

## 2022-07-02 NOTE — Progress Notes (Signed)
[name] is a 41 y.o. female who presents to [facility] clinic today with no complaints.    Pap Smear: Pap smear not completed today. Last Pap smear was 06/16/2020 at [facility] and was normal with negative HPV. Per patient has history of two abnormal Pap smears between 2007-2012. Patient stated the first one she had a repeat Pap smear that was normal and the second one she had a colposcopy to follow up. Patient stated that all Pap smears have been normal since colposcopy that she has had at least three normal Pap smears since. Last Pap smear result is available in Epic.   Physical exam: Breasts Breasts symmetrical. No skin abnormalities bilateral breasts. No nipple retraction bilateral breasts. No nipple discharge bilateral breasts. No lymphadenopathy. No lumps palpated bilateral breasts. Complaints of right outer breast tenderness on exam.     MS DIGITAL SCREENING TOMO BILATERAL  Result Date: 11/30/2019 CLINICAL DATA:  Screening. EXAM: DIGITAL SCREENING BILATERAL MAMMOGRAM WITH TOMO AND CAD COMPARISON:  None. ACR Breast Density Category c: The breast tissue is heterogeneously dense, which may obscure small masses FINDINGS: There are no findings suspicious for malignancy. Images were processed with CAD. IMPRESSION: No mammographic evidence of malignancy. A result letter of this screening mammogram will be mailed directly to the patient. RECOMMENDATION: Screening mammogram at age 26. (Code:SM-B-40A) BI-RADS CATEGORY  1: Negative. Electronically Signed   By: Kristopher Oppenheim M.D.   On: 11/30/2019 09:35     Pelvic/Bimanual Pap is not indicated today per [facility]CP guidelines.   Smoking History: Patient has never smoked.  Patient Navigation: Patient education provided. Access to services provided for patient through [facility] program.   Breast and Cervical Cancer Risk Assessment: Patient does not have family history of breast cancer, known genetic mutations, or radiation  treatment to the chest before age 38. Per patient has history of cervical dysplasia. Patient has no history of being immunocompromised or DES exposure in-utero.  Risk Assessment     Risk Scores       07/02/2022   Last edited by: Royston Bake, CMA   5-year risk: 0.5 %   Lifetime risk: 9.7 %            A: [facility] exam without pap smear No complaints.  P: Referred patient to the [facility] for a screening mammogram on mobile unit. Appointment scheduled Tuesday, July 02, 2022 at 1000.  Loletta Parish, RN 07/02/2022 9:18 AM

## 2022-07-22 ENCOUNTER — Other Ambulatory Visit: Payer: Self-pay

## 2022-07-23 ENCOUNTER — Ambulatory Visit: Payer: Self-pay | Admitting: Internal Medicine

## 2022-07-23 ENCOUNTER — Ambulatory Visit: Payer: Self-pay | Admitting: Podiatry

## 2022-08-19 ENCOUNTER — Other Ambulatory Visit: Payer: Self-pay | Admitting: Nurse Practitioner

## 2022-08-19 ENCOUNTER — Other Ambulatory Visit: Payer: Self-pay

## 2022-08-19 DIAGNOSIS — G43709 Chronic migraine without aura, not intractable, without status migrainosus: Secondary | ICD-10-CM

## 2022-08-19 DIAGNOSIS — M62838 Other muscle spasm: Secondary | ICD-10-CM

## 2022-08-20 ENCOUNTER — Other Ambulatory Visit: Payer: Self-pay

## 2022-08-29 ENCOUNTER — Other Ambulatory Visit: Payer: Self-pay

## 2022-09-18 ENCOUNTER — Other Ambulatory Visit: Payer: Self-pay | Admitting: Nurse Practitioner

## 2022-09-18 ENCOUNTER — Other Ambulatory Visit: Payer: Self-pay

## 2022-09-18 DIAGNOSIS — G43709 Chronic migraine without aura, not intractable, without status migrainosus: Secondary | ICD-10-CM

## 2022-09-20 ENCOUNTER — Other Ambulatory Visit: Payer: Self-pay

## 2022-10-01 ENCOUNTER — Ambulatory Visit: Payer: Self-pay | Admitting: Nurse Practitioner

## 2022-11-05 ENCOUNTER — Ambulatory Visit: Payer: No Typology Code available for payment source | Admitting: Nurse Practitioner

## 2022-11-08 ENCOUNTER — Telehealth: Payer: Self-pay | Admitting: Nurse Practitioner

## 2022-11-08 ENCOUNTER — Telehealth: Payer: Self-pay | Admitting: Physician Assistant

## 2022-11-08 ENCOUNTER — Ambulatory Visit: Payer: Self-pay | Admitting: *Deleted

## 2022-11-08 DIAGNOSIS — J019 Acute sinusitis, unspecified: Secondary | ICD-10-CM

## 2022-11-08 DIAGNOSIS — B9689 Other specified bacterial agents as the cause of diseases classified elsewhere: Secondary | ICD-10-CM

## 2022-11-08 MED ORDER — PREDNISONE 20 MG PO TABS: 40.0000 mg | ORAL_TABLET | Freq: Every day | ORAL | 0 refills | Status: DC

## 2022-11-08 MED ORDER — AMOXICILLIN-POT CLAVULANATE 875-125 MG PO TABS: 1.0000 | ORAL_TABLET | Freq: Two times a day (BID) | ORAL | 0 refills | Status: DC

## 2022-11-08 NOTE — Patient Instructions (Signed)
Laural Graves, thank you for joining Rhonda Rio, PA-C for today's virtual visit.  While this provider is not your primary care provider (PCP), if your PCP is located in our provider database this encounter information will be shared with them immediately following your visit.   [facility] account gives you access to today's visit and all your visits, tests, and labs performed at [facility] " click here if you don't have a [facility] account or go to [email or web address]  Consent: (Patient) Rhonda Graves provided verbal consent for this virtual visit at the beginning of the encounter.  Current Medications:  Current Outpatient Medications:    benzonatate (TESSALON) 100 MG capsule, Take 1 capsule (100 mg total) by mouth 2 (two) times daily as needed for cough. (Patient not taking: Reported on 07/02/2022), Disp: 20 capsule, Rfl: 1   cetirizine (ZYRTEC) 10 MG tablet, Take 1 (one) tablet by mouth daily, Disp: 30 tablet, Rfl: 11   cyclobenzaprine (FLEXERIL) 5 MG tablet, Take 1 tablet (5 mg total) by mouth 3 (three) times daily as needed for muscle spasms. (Patient not taking: Reported on 07/02/2022), Disp: 60 tablet, Rfl: 2   dicyclomine (BENTYL) 10 MG capsule, Take 1 capsule (10 mg total) by mouth 4 (four) times daily -  before meals and at bedtime. (Patient not taking: Reported on 07/02/2022), Disp: 50 capsule, Rfl: 2   EPINEPHrine 0.3 mg/0.3 mL IJ SOAJ injection, Inject 0.3 mg into the muscle as needed., Disp: 2 each, Rfl: 0   fluticasone (FLONASE) 50 MCG/ACT nasal spray, PLACE 1 SPRAY INTO BOTH NOSTRILS DAILY., Disp: 16 g, Rfl: 6   omeprazole (PRILOSEC) 40 MG capsule, Take 1 capsule (40 mg total) by mouth daily. (Patient not taking: Reported on 07/02/2022), Disp: 30 capsule, Rfl: 3   predniSONE (DELTASONE) 20 MG tablet, Take 2 tablets (40 mg total) by mouth daily. (Patient not taking: Reported on 07/02/2022), Disp: 10 tablet, Rfl: 0   SUMAtriptan (IMITREX)  50 MG tablet, TAKE 1 TABLET BY MOUTH AT ONSET OF HEADACHE. MAY REPEAT IN 2 HOURS IF HEADACHE PERSISTS OR RECURS., Disp: 12 tablet, Rfl: 6   traZODone (DESYREL) 50 MG tablet, Take 0.5-1 tablets (25-50 mg total) by mouth at bedtime as needed for sleep. (Patient not taking: Reported on 07/02/2022), Disp: 30 tablet, Rfl: 3   Medications ordered in this encounter:  No orders of the defined types were placed in this encounter.    *If you need refills on other medications prior to your next appointment, please contact your pharmacy*  Follow-Up: Call back or seek an in-person evaluation if the symptoms worsen or if the condition fails to improve as anticipated.  [place] [phone]  Other Instructions Please take antibiotic and steroid as directed.  Increase fluid intake.  Use Saline nasal spray.  Take a daily multivitamin. Try to switch your Zyrtec for Xyzal OTC once daily. Ok to continue Sudafed for now.  Place a humidifier in the bedroom.  Please call or return clinic if symptoms are not improving.  Sinusitis Sinusitis is redness, soreness, and swelling (inflammation) of the paranasal sinuses. Paranasal sinuses are air pockets within the bones of your face (beneath the eyes, the middle of the forehead, or above the eyes). In healthy paranasal sinuses, mucus is able to drain out, and air is able to circulate through them by way of your nose. However, when your paranasal sinuses are inflamed, mucus and air can become trapped. This can allow bacteria and other  germs to grow and cause infection. Sinusitis can develop quickly and last only a short time (acute) or continue over a long period (chronic). Sinusitis that lasts for more than 12 weeks is considered chronic.  CAUSES  Causes of sinusitis include: Allergies. Structural abnormalities, such as displacement of the cartilage that separates your nostrils (deviated septum), which can decrease the air flow through your nose and  sinuses and affect sinus drainage. Functional abnormalities, such as when the small hairs (cilia) that line your sinuses and help remove mucus do not work properly or are not present. SYMPTOMS  Symptoms of acute and chronic sinusitis are the same. The primary symptoms are pain and pressure around the affected sinuses. Other symptoms include: Upper toothache. Earache. Headache. Bad breath. Decreased sense of smell and taste. A cough, which worsens when you are lying flat. Fatigue. Fever. Thick drainage from your nose, which often is green and may contain pus (purulent). Swelling and warmth over the affected sinuses. DIAGNOSIS  Your caregiver will perform a physical exam. During the exam, your caregiver may: Look in your nose for signs of abnormal growths in your nostrils (nasal polyps). Tap over the affected sinus to check for signs of infection. View the inside of your sinuses (endoscopy) with a special imaging device with a light attached (endoscope), which is inserted into your sinuses. If your caregiver suspects that you have chronic sinusitis, one or more of the following tests may be recommended: Allergy tests. Nasal culture A sample of mucus is taken from your nose and sent to a lab and screened for bacteria. Nasal cytology A sample of mucus is taken from your nose and examined by your caregiver to determine if your sinusitis is related to an allergy. TREATMENT  Most cases of acute sinusitis are related to a viral infection and will resolve on their own within 10 days. Sometimes medicines are prescribed to help relieve symptoms (pain medicine, decongestants, nasal steroid sprays, or saline sprays).  However, for sinusitis related to a bacterial infection, your caregiver will prescribe antibiotic medicines. These are medicines that will help kill the bacteria causing the infection.  Rarely, sinusitis is caused by a fungal infection. In theses cases, your caregiver will prescribe  antifungal medicine. For some cases of chronic sinusitis, surgery is needed. Generally, these are cases in which sinusitis recurs more than 3 times per year, despite other treatments. HOME CARE INSTRUCTIONS  Drink plenty of water. Water helps thin the mucus so your sinuses can drain more easily. Use a humidifier. Inhale steam 3 to 4 times a day (for example, sit in the bathroom with the shower running). Apply a warm, moist washcloth to your face 3 to 4 times a day, or as directed by your caregiver. Use saline nasal sprays to help moisten and clean your sinuses. Take over-the-counter or prescription medicines for pain, discomfort, or fever only as directed by your caregiver. SEEK IMMEDIATE MEDICAL CARE IF: You have increasing pain or severe headaches. You have nausea, vomiting, or drowsiness. You have swelling around your face. You have vision problems. You have a stiff neck. You have difficulty breathing. MAKE SURE YOU:  Understand these instructions. Will watch your condition. Will get help right away if you are not doing well or get worse. Document Released: 09/09/2005 Document Revised: 12/02/2011 Document Reviewed: 09/24/2011 [facility] Patient Information 2014 [place], [place].    If you have been instructed to have an in-person evaluation today at a local Urgent Care facility, please use the link below. It will  take you to a list of all of our available [facility], including address, phone number and hours of operation. Please do not delay care.  [facility]  If you or a family member do not have a primary care provider, use the link below to schedule a visit and establish care. When you choose a [facility] primary care physician or advanced practice provider, you gain a long-term partner in health. Find a Primary Care Provider  Learn more about [facility]'s in-office and virtual care options: [facility]

## 2022-11-08 NOTE — Progress Notes (Signed)
Virtual Visit Consent   Rhonda Graves, you are scheduled for a virtual visit with a [facility] provider today. Just as with appointments in the office, your consent must be obtained to participate. Your consent will be active for this visit and any virtual visit you may have with one of our providers in the next 365 days. If you have a MyChart account, a copy of this consent can be sent to you electronically.  As this is a virtual visit, video technology does not allow for your provider to perform a traditional examination. This may limit your provider's ability to fully assess your condition. If your provider identifies any concerns that need to be evaluated in person or the need to arrange testing (such as labs, EKG, etc.), we will make arrangements to do so. Although advances in technology are sophisticated, we cannot ensure that it will always work on either your end or our end. If the connection with a video visit is poor, the visit may have to be switched to a telephone visit. With either a video or telephone visit, we are not always able to ensure that we have a secure connection.  By engaging in this virtual visit, you consent to the provision of healthcare and authorize for your insurance to be billed (if applicable) for the services provided during this visit. Depending on your insurance coverage, you may receive a charge related to this service.  I need to obtain your verbal consent now. Are you willing to proceed with your visit today? Rhonda Graves has provided verbal consent on 11/08/2022 for a virtual visit (video or telephone). Rhonda Graves, [place]  Date: 11/08/2022 4:45 PM  Virtual Visit via Video Note   I, Rhonda Graves, connected with  Rhonda Graves  ([id number], 08-24-1981) on 11/08/22 at  4:45 PM EST by a video-enabled telemedicine application and verified that I am speaking with the correct person using two identifiers.  Location: Patient: Virtual Visit Location Patient:  Home Provider: Virtual Visit Location Provider: Home Office   I discussed the limitations of evaluation and management by telemedicine and the availability of in person appointments. The patient expressed understanding and agreed to proceed.    History of Present Illness: Rhonda Graves is a 42 y.o. who identifies as a female who was assigned female at birth, and is being seen today for concern of sinus infection after having COVID. Was first diagnosed with COVID on 2/7 with nasal congestion, headache, cough and fatigue. Notes most of the symptoms improved/resolve by Monday. By Wednesday noting substantial increase in nasal and head congestion, sinus pressure and sinus pain. Notes change in nasal discharge to thick and green.   OTC -- Tylenol/Ibuprofen, Fluticasone spray, Sudafed.  HPI: HPI  Problems:  Patient Active Problem List   Diagnosis Date Noted   Status post repeat low transverse cesarean section 08/20/2017   Chronic hypertension 08/16/2017   Muscle spasms of neck 07/30/2017   Edema of right lower extremity 05/29/2017   AMA (advanced maternal age) multigravida 35+, second trimester 03/06/2017   History of cesarean delivery 03/06/2017   Sinus bradycardia 03/06/2017   Supervision of high risk pregnancy, antepartum 01/06/2017   Female fertility problem 04/05/2016   Environmental allergies 02/02/2016   Nevus of multiple sites of trunk 09/08/2014   Chronic hypertension in pregnancy 02/18/2014   Migraine 02/18/2014   Polyhydramnios affecting pregnancy in third trimester 11/25/2013   IBS (irritable bowel syndrome) 04/08/2013    Allergies:  Allergies  Allergen Reactions  Other Anaphylaxis    Walnuts   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 Uncertain what caused reaction.   Lomotil [Diphenoxylate] Itching and Swelling    Had  allergic reaction after Lomotil, Lysteda and Hemabate. Uncertain what caused reaction.   Lysteda [Tranexamic Acid] Itching and Swelling    Had allergic reaction after Lomotil, Lysteda and Hemabate. Uncertain what caused reaction.   Medications:  Current Outpatient Medications:    cetirizine (ZYRTEC) 10 MG tablet, Take 1 (one) tablet by mouth daily, Disp: 30 tablet, Rfl: 11   cyclobenzaprine (FLEXERIL) 5 MG tablet, Take 1 tablet (5 mg total) by mouth 3 (three) times daily as needed for muscle spasms. (Patient not taking: Reported on 07/02/2022), Disp: 60 tablet, Rfl: 2   EPINEPHrine 0.3 mg/0.3 mL IJ SOAJ injection, Inject 0.3 mg into the muscle as needed., Disp: 2 each, Rfl: 0   SUMAtriptan (IMITREX) 50 MG tablet, TAKE 1 TABLET BY MOUTH AT ONSET OF HEADACHE. MAY REPEAT IN 2 HOURS IF HEADACHE PERSISTS OR RECURS., Disp: 12 tablet, Rfl: 6   traZODone (DESYREL) 50 MG tablet, Take 0.5-1 tablets (25-50 mg total) by mouth at bedtime as needed for sleep. (Patient not taking: Reported on 07/02/2022), Disp: 30 tablet, Rfl: 3  Observations/Objective: Patient is well-developed, well-nourished in no acute distress.  Resting comfortably at home.  Head is normocephalic, atraumatic.  No labored breathing. Speech is clear and coherent with logical content.  Patient is alert and oriented at baseline.   Assessment and Plan: 1. Acute bacterial sinusitis  Rx Augmentin.  Increase fluids.  Rest.  Saline nasal spray.  Probiotic.  Mucinex as directed.  Humidifier in bedroom. Prednisone per orders. Ok to continue Fluticasone nasal spray.  Call or return to clinic if symptoms are not improving.  Follow Up Instructions: I discussed the assessment and treatment plan with the patient. The patient was provided an opportunity to ask questions and all were answered. The patient agreed with the plan and demonstrated an understanding of the instructions.  A copy of instructions were sent to the patient via MyChart unless  otherwise noted below.   The patient was advised to call back or seek an in-person evaluation if the symptoms worsen or if the condition fails to improve as anticipated.  Time:  I spent 10 minutes with the patient via telehealth technology discussing the above problems/concerns.    Rhonda Rio, PA-C

## 2022-11-08 NOTE — Telephone Encounter (Signed)
Pt called to apply for [facility]. Sched appt for Tues 11/12/2022. Pt to bring completed app w/ ID, 3 months utility bill, 3 month bank stmts, 2022 tax return.

## 2022-11-08 NOTE — Telephone Encounter (Signed)
  Chief Complaint: sinus pressure- + COVID 10/30/22 Symptoms: sinus pain- pressure, nasal congestion , headache Frequency: 10/30/22 Pertinent Negatives: Patient denies fever Disposition: []$ ED /[]$ Urgent Care (no appt availability in office) / []$ Appointment(In office/virtual)/ []$  [facility]/ []$ Home Care/ []$ Refused Recommended Disposition /[]$ [facility]/ []$  Follow-up with PCP Additional Notes: Patient tested + COVID 2/7 - may have secondary infection- sinus pain and pressure- no open appointment- UC virtual/UC next option- patient asked for call back at 12:30- she is at work and gets break. Attempted to call patient- left message on VM with options- UC/UC virtual appointment

## 2022-11-08 NOTE — Telephone Encounter (Signed)
Summary: Seeking Rx, symptoms. No appts soon enough ([place])   Pt called requesting to be seen sooner than her next appt, has possible sinus infection symptoms that she says are severe/messing up her day. Seeking Rx bc she says OTC meds are not providing relief.         Reason for Disposition  [1] SEVERE pain AND [2] not improved 2 hours after pain medicine  Answer Assessment - Initial Assessment Questions 1. LOCATION: "Where does it hurt?"      Eye pressure- cheek pain, forehead 2. ONSET: "When did the sinus pain start?"  (e.g., hours, days)      10/30/22 3. SEVERITY: "How bad is the pain?"   (Scale 1-10; mild, moderate or severe)   - MILD (1-3): doesn't interfere with normal activities    - MODERATE (4-7): interferes with normal activities (e.g., work or school) or awakens from sleep   - SEVERE (8-10): excruciating pain and patient unable to do any normal activities        severe 4. RECURRENT SYMPTOM: "Have you ever had sinus problems before?" If Yes, ask: "When was the last time?" and "What happened that time?"      Yes- frequent 5. NASAL CONGESTION: "Is the nose blocked?" If Yes, ask: "Can you open it or must you breathe through your mouth?"     At times- one nostril blocked then the other 6. NASAL DISCHARGE: "Do you have discharge from your nose?" If so ask, "What color?"     green 7. FEVER: "Do you have a fever?" If Yes, ask: "What is it, how was it measured, and when did it start?"      No- using Ibuprofen and tylenol 8. OTHER SYMPTOMS: "Do you have any other symptoms?" (e.g., sore throat, cough, earache, difficulty breathing)     Yes- negative COVID- 2/7- tested positive  Protocols used: Sinus Pain or Congestion-A-AH

## 2022-11-12 ENCOUNTER — Ambulatory Visit: Payer: Self-pay | Attending: Nurse Practitioner

## 2022-11-15 ENCOUNTER — Encounter: Payer: Self-pay | Admitting: Nurse Practitioner

## 2022-11-17 ENCOUNTER — Encounter: Payer: Self-pay | Admitting: Family Medicine

## 2022-11-17 MED ORDER — FLUCONAZOLE 150 MG PO TABS: 150.0000 mg | ORAL_TABLET | Freq: Once | ORAL | 1 refills | Status: DC | PRN

## 2022-11-18 ENCOUNTER — Other Ambulatory Visit: Payer: Self-pay | Admitting: Nurse Practitioner

## 2022-11-20 ENCOUNTER — Other Ambulatory Visit: Payer: Self-pay

## 2022-11-20 ENCOUNTER — Ambulatory Visit: Payer: Self-pay | Attending: Nurse Practitioner | Admitting: Nurse Practitioner

## 2022-11-20 ENCOUNTER — Encounter: Payer: Self-pay | Admitting: Nurse Practitioner

## 2022-11-20 VITALS — BP 138/89 | HR 74 | Ht 61.0 in | Wt 142.2 lb

## 2022-11-20 DIAGNOSIS — Z9109 Other allergy status, other than to drugs and biological substances: Secondary | ICD-10-CM

## 2022-11-20 DIAGNOSIS — M62838 Other muscle spasm: Secondary | ICD-10-CM

## 2022-11-20 DIAGNOSIS — Z13228 Encounter for screening for other metabolic disorders: Secondary | ICD-10-CM

## 2022-11-20 DIAGNOSIS — F5101 Primary insomnia: Secondary | ICD-10-CM

## 2022-11-20 DIAGNOSIS — E782 Mixed hyperlipidemia: Secondary | ICD-10-CM

## 2022-11-20 DIAGNOSIS — Z862 Personal history of diseases of the blood and blood-forming organs and certain disorders involving the immune mechanism: Secondary | ICD-10-CM

## 2022-11-20 MED ORDER — LEVOCETIRIZINE DIHYDROCHLORIDE 5 MG PO TABS
5.0000 mg | ORAL_TABLET | Freq: Every evening | ORAL | 3 refills | Status: DC
  Filled 2022-11-20: qty 90, 90d supply, fill #0
  Filled 2022-11-21: qty 30, 30d supply, fill #0
  Filled 2022-12-24: qty 30, 30d supply, fill #1
  Filled 2023-01-20 – 2023-01-27 (×2): qty 30, 30d supply, fill #2
  Filled 2023-02-28: qty 30, 30d supply, fill #3
  Filled 2023-03-25: qty 30, 30d supply, fill #4
  Filled 2023-05-13: qty 30, 30d supply, fill #5

## 2022-11-20 MED ORDER — TRAZODONE HCL 100 MG PO TABS
100.0000 mg | ORAL_TABLET | Freq: Every evening | ORAL | 1 refills | Status: DC | PRN
  Filled 2022-11-20: qty 30, 30d supply, fill #0

## 2022-11-20 MED ORDER — CYCLOBENZAPRINE HCL 5 MG PO TABS
5.0000 mg | ORAL_TABLET | Freq: Three times a day (TID) | ORAL | 3 refills | Status: DC | PRN
  Filled 2022-11-20: qty 60, 20d supply, fill #0
  Filled 2023-02-07: qty 90, 30d supply, fill #1
  Filled 2023-02-07: qty 60, 20d supply, fill #1
  Filled 2023-05-13: qty 90, 30d supply, fill #2

## 2022-11-20 NOTE — Progress Notes (Deleted)
Rhemutiod panel

## 2022-11-20 NOTE — Progress Notes (Signed)
Assessment & Plan:  Rhonda Graves was seen today for medication refill.  Diagnoses and all orders for this visit:  Environmental allergies -     levocetirizine (XYZAL ALLERGY 24HR) 5 MG tablet; Take 1 tablet (5 mg total) by mouth every evening.  Primary insomnia  Muscle spasms of neck -     cyclobenzaprine (FLEXERIL) 5 MG tablet; Take 1 tablet (5 mg total) by mouth 3 (three) times daily as needed for muscle spasms.  Screening for metabolic disorder -     0000000  Mixed hyperlipidemia -     Lipid panel  History of anemia -     CBC with Differential  Other orders -     traZODone (DESYREL) 100 MG tablet; Take 1 tablet (100 mg total) by mouth at bedtime as needed for sleep.    Patient has been counseled on age-appropriate routine health concerns for screening and prevention. These are reviewed and up-to-date. Referrals have been placed accordingly. Immunizations are up-to-date or declined.    Subjective:   Chief Complaint  Patient presents with   Medication Refill   Medication Refill Associated symptoms include myalgias. Pertinent negatives include no chest pain, coughing, fever, headaches, nausea or vomiting.   Rhonda Graves 42 y.o. female presents to office today for medication refills.  Would like to try xyzal for her allergies instead of ceterizine  She requested a lab that her friend had performed ([facility]). Unfortunately this lab is not available through our system. I have instructed her that the lab is not needed at this time. Based on her cholesterol levels being elevated I would recommend eliminating beef from her diet. Also informed her that not only does beef increase the risk of heart disease but also the risk of colon cancer.   Blood pressure is well-controlled today.  She tries to eat well and exercise been exercising routinely BP Readings from Last 3 Encounters:  11/20/22 138/89  07/02/22 120/82  06/08/21 124/81     Primary insomnia  Unfortunately trazodone  25 to 50 mg nightly coupled with melatonin 10 mg has not been effective and allowing her to remain asleep throughout the night.      Review of Systems  Constitutional:  Negative for fever, malaise/fatigue and weight loss.  HENT: Negative.  Negative for nosebleeds.   Eyes: Negative.  Negative for blurred vision, double vision and photophobia.  Respiratory: Negative.  Negative for cough and shortness of breath.   Cardiovascular: Negative.  Negative for chest pain, palpitations and leg swelling.  Gastrointestinal: Negative.  Negative for heartburn, nausea and vomiting.  Musculoskeletal:  Positive for myalgias.  Neurological: Negative.  Negative for dizziness, focal weakness, seizures and headaches.  Endo/Heme/Allergies:  Positive for environmental allergies.  Psychiatric/Behavioral:  Negative for suicidal ideas. The patient has insomnia.     Past Medical History:  Diagnosis Date   Abnormal Pap smear    had colpo in past, normal pap since then   GERD (gastroesophageal reflux disease) Dx 2010   Headache(784.0)    migraine   History of oral herpes simplex infection 05/29/2017   +IgM 1/2 antibody screen done for cold sores vs aphthous ulcers   IBS (irritable bowel syndrome)    Multiple allergies     Past Surgical History:  Procedure Laterality Date   CESAREAN SECTION N/A 11/26/2013   Procedure: CESAREAN SECTION;  Surgeon: Lavonia Drafts, MD;  Location: [facility];  Service: Obstetrics;  Laterality: N/A;   CESAREAN SECTION N/A 08/17/2017   Procedure: CESAREAN SECTION;  Surgeon:  Jonnie Kind, MD;  Location: [facility];  Service: Obstetrics;  Laterality: N/A;    Family History  Problem Relation Age of Onset   Hypothyroidism Mother    Arthritis Mother    Asthma Brother    Allergic rhinitis Brother    Breast cancer Neg Hx     Social History Reviewed with no changes to be made today.   Outpatient Medications Prior to Visit  Medication Sig Dispense Refill    cetirizine (ZYRTEC) 10 MG tablet Take 1 (one) tablet by mouth daily 30 tablet 11   EPINEPHrine 0.3 mg/0.3 mL IJ SOAJ injection Inject 0.3 mg into the muscle as needed. 2 each 0   fluconazole (DIFLUCAN) 150 MG tablet Take 1 tablet (150 mg total) by mouth once as needed. Can take additional dose three days later if symptoms persist 1 tablet 1   SUMAtriptan (IMITREX) 50 MG tablet TAKE 1 TABLET BY MOUTH AT ONSET OF HEADACHE. MAY REPEAT IN 2 HOURS IF HEADACHE PERSISTS OR RECURS. 12 tablet 6   amoxicillin-clavulanate (AUGMENTIN) 875-125 MG tablet Take 1 tablet by mouth 2 (two) times daily. (Patient not taking: Reported on 11/20/2022) 14 tablet 0   cyclobenzaprine (FLEXERIL) 5 MG tablet Take 1 tablet (5 mg total) by mouth 3 (three) times daily as needed for muscle spasms. (Patient not taking: Reported on 07/02/2022) 60 tablet 2   predniSONE (DELTASONE) 20 MG tablet Take 2 tablets (40 mg total) by mouth daily with breakfast. (Patient not taking: Reported on 11/20/2022) 10 tablet 0   traZODone (DESYREL) 50 MG tablet Take 0.5-1 tablets (25-50 mg total) by mouth at bedtime as needed for sleep. (Patient not taking: Reported on 07/02/2022) 30 tablet 3   No facility-administered medications prior to visit.    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 Uncertain what caused reaction.   Lomotil [Diphenoxylate] Itching and Swelling    Had allergic reaction after Lomotil, Lysteda and Hemabate. Uncertain what caused reaction.   Lysteda [Tranexamic Acid] Itching and Swelling    Had allergic reaction after Lomotil, Lysteda and Hemabate. Uncertain what caused reaction.       Objective:    BP 138/89   Pulse 74   Ht '5\' 1"'$  (1.549 m)   Wt 142 lb 3.2 oz (64.5 kg)   LMP 10/29/2022 (Exact Date)   SpO2  98%   BMI 26.87 kg/m  Wt Readings from Last 3 Encounters:  11/20/22 142 lb 3.2 oz (64.5 kg)  07/02/22 147 lb 9.6 oz (67 kg)  06/08/21 156 lb (70.8 kg)    Physical Exam Vitals and nursing note reviewed.  Constitutional:      Appearance: She is well-developed.  HENT:     Head: Normocephalic and atraumatic.  Cardiovascular:     Rate and Rhythm: Normal rate and regular rhythm.     Heart sounds: Normal heart sounds. No murmur heard.    No friction rub. No gallop.  Pulmonary:     Effort: Pulmonary effort is normal. No tachypnea or respiratory distress.     Breath sounds: Normal breath sounds. No decreased breath sounds, wheezing, rhonchi or rales.  Chest:     Chest wall: No tenderness.  Abdominal:     General: Bowel sounds are normal.     Palpations: Abdomen is soft.  Musculoskeletal:        General: Normal range of motion.     Cervical back: Normal range of motion.  Skin:    General: Skin is warm and dry.  Neurological:     Mental Status: She is alert and oriented to person, place, and time.     Coordination: Coordination normal.  Psychiatric:        Behavior: Behavior normal. Behavior is cooperative.        Thought Content: Thought content normal.        Judgment: Judgment normal.          Patient has been counseled extensively about nutrition and exercise as well as the importance of adherence with medications and regular follow-up. The patient was given clear instructions to go to ER or return to medical center if symptoms don't improve, worsen or new problems develop. The patient verbalized understanding.   Follow-up: Return if symptoms worsen or fail to improve.   Gildardo Pounds, FNP-BC [facility], [place]   11/20/2022, 5:59 PM

## 2022-11-21 ENCOUNTER — Other Ambulatory Visit: Payer: Self-pay

## 2022-11-22 ENCOUNTER — Encounter: Payer: Self-pay | Admitting: Nurse Practitioner

## 2022-11-22 ENCOUNTER — Other Ambulatory Visit: Payer: Self-pay

## 2022-11-22 ENCOUNTER — Ambulatory Visit: Payer: Self-pay | Attending: Nurse Practitioner

## 2022-11-22 ENCOUNTER — Other Ambulatory Visit: Payer: Self-pay | Admitting: Nurse Practitioner

## 2022-11-22 DIAGNOSIS — G43709 Chronic migraine without aura, not intractable, without status migrainosus: Secondary | ICD-10-CM

## 2022-11-22 MED ORDER — SUMATRIPTAN SUCCINATE 50 MG PO TABS
ORAL_TABLET | ORAL | 6 refills | Status: DC
  Filled 2022-11-22: qty 9, 30d supply, fill #0
  Filled 2023-02-07: qty 9, 30d supply, fill #1

## 2022-11-22 MED ORDER — DICLOFENAC SODIUM 1 % EX GEL
2.0000 g | Freq: Four times a day (QID) | CUTANEOUS | 1 refills | Status: DC
  Filled 2022-11-22: qty 100, 12d supply, fill #0
  Filled 2023-02-07 (×2): qty 100, 12d supply, fill #1
  Filled 2023-02-28 – 2023-03-25 (×2): qty 100, 12d supply, fill #2
  Filled 2023-08-26: qty 100, 12d supply, fill #3

## 2022-11-23 LAB — CBC WITH DIFFERENTIAL/PLATELET
Basophils Absolute: 0 10*3/uL (ref 0.0–0.2)
Basos: 1 %
EOS (ABSOLUTE): 0.1 10*3/uL (ref 0.0–0.4)
Eos: 3 %
Hematocrit: 37.2 % (ref 34.0–46.6)
Hemoglobin: 12.2 g/dL (ref 11.1–15.9)
Immature Grans (Abs): 0 10*3/uL (ref 0.0–0.1)
Immature Granulocytes: 0 %
Lymphocytes Absolute: 2.1 10*3/uL (ref 0.7–3.1)
Lymphs: 43 %
MCH: 31.9 pg (ref 26.6–33.0)
MCHC: 32.8 g/dL (ref 31.5–35.7)
MCV: 97 fL (ref 79–97)
Monocytes Absolute: 0.5 10*3/uL (ref 0.1–0.9)
Monocytes: 11 %
Neutrophils Absolute: 2 10*3/uL (ref 1.4–7.0)
Neutrophils: 42 %
Platelets: 256 10*3/uL (ref 150–450)
RBC: 3.82 x10E6/uL (ref 3.77–5.28)
RDW: 12.3 % (ref 11.7–15.4)
WBC: 4.8 10*3/uL (ref 3.4–10.8)

## 2022-11-23 LAB — CMP14+EGFR
ALT: 42 IU/L — ABNORMAL HIGH (ref 0–32)
AST: 30 IU/L (ref 0–40)
Albumin/Globulin Ratio: 2 (ref 1.2–2.2)
Albumin: 4.5 g/dL (ref 3.9–4.9)
Alkaline Phosphatase: 87 IU/L (ref 44–121)
BUN/Creatinine Ratio: 17 (ref 9–23)
BUN: 12 mg/dL (ref 6–24)
Bilirubin Total: 0.8 mg/dL (ref 0.0–1.2)
CO2: 21 mmol/L (ref 20–29)
Calcium: 9.4 mg/dL (ref 8.7–10.2)
Chloride: 104 mmol/L (ref 96–106)
Creatinine, Ser: 0.72 mg/dL (ref 0.57–1.00)
Globulin, Total: 2.3 g/dL (ref 1.5–4.5)
Glucose: 91 mg/dL (ref 70–99)
Potassium: 4 mmol/L (ref 3.5–5.2)
Sodium: 139 mmol/L (ref 134–144)
Total Protein: 6.8 g/dL (ref 6.0–8.5)
eGFR: 108 mL/min/{1.73_m2} (ref >59)

## 2022-11-23 LAB — LIPID PANEL
Chol/HDL Ratio: 2.7 ratio (ref 0.0–4.4)
Cholesterol, Total: 218 mg/dL — ABNORMAL HIGH (ref 100–199)
HDL: 80 mg/dL (ref >39)
LDL Chol Calc (NIH): 126 mg/dL — ABNORMAL HIGH (ref 0–99)
Triglycerides: 70 mg/dL (ref 0–149)
VLDL Cholesterol Cal: 12 mg/dL (ref 5–40)

## 2022-12-16 ENCOUNTER — Other Ambulatory Visit: Payer: Self-pay

## 2022-12-24 ENCOUNTER — Other Ambulatory Visit: Payer: Self-pay

## 2023-01-16 ENCOUNTER — Other Ambulatory Visit: Payer: Self-pay

## 2023-01-16 ENCOUNTER — Encounter: Payer: Self-pay | Admitting: Allergy

## 2023-01-16 ENCOUNTER — Ambulatory Visit (INDEPENDENT_AMBULATORY_CARE_PROVIDER_SITE_OTHER): Payer: No Typology Code available for payment source | Admitting: Allergy

## 2023-01-16 VITALS — BP 110/70 | HR 63 | Temp 98.4°F | Resp 16 | Ht 60.63 in | Wt 143.6 lb

## 2023-01-16 DIAGNOSIS — J301 Allergic rhinitis due to pollen: Secondary | ICD-10-CM

## 2023-01-16 DIAGNOSIS — L509 Urticaria, unspecified: Secondary | ICD-10-CM

## 2023-01-16 DIAGNOSIS — L299 Pruritus, unspecified: Secondary | ICD-10-CM

## 2023-01-16 DIAGNOSIS — T7800XD Anaphylactic reaction due to unspecified food, subsequent encounter: Secondary | ICD-10-CM

## 2023-01-16 DIAGNOSIS — Z888 Allergy status to other drugs, medicaments and biological substances status: Secondary | ICD-10-CM

## 2023-01-16 NOTE — Progress Notes (Signed)
New Patient Note  RE: Rhonda Graves MRN: [id number] DOB: 03-10-1981 Date of Office Visit: 01/16/2023  Primary care provider: Claiborne Rigg, NP  Chief Complaint: allergies  History of present illness: Rhonda Graves is a 42 y.o. female presenting today for evaluation of allergies.   I have seen her as a patient last on 02/05/18 for itching, allergic rhinitis, food allergy, insect bite hypersensitivity and medication adverse events.    She has had previous allergy testing from 2019 that did show detectable IgE levels for grass pollen, tree pollen, weed pollen.  On Food IgE testing with detectable levels for peanut, hazelnut, almond, pecan, walnut, peach.  No detectable levels for milk.  She states that with sunflower seeds sometimes she can have symptoms but it is not every time. With peach ingestion with touching the skin she develops a rash.  She has to use a plastic bag to grab them at the grocery store.  With ingestion she can have anaphylaxis.  Thus she tries to avoid stone fruits. In 2005 she had a very bad reaction to walnuts.  She had to go to ED with throat swelling and generalized swelling.  With peanuts she can have itching then redness then development of hives continues to progress.  She feels like when she sleeps in her bed and hasn't changed her sheets in a while she gets itchy.  She is wondering if she is allergic to dust mites.  She was taking cetrizine daily and recently changed to xyzal.   She states she has had hives randomly about 1-2 times a year that last for about a day.    She does get quite a few sinus infections.  She is wondering what she can do if she has reaction symptoms or hives and if she can take prednisone and if so how much it should she be taking at a time.  She has been able to get a prednisone when she has been in [place] as this is an over-the-counter medication there.  She does have access to an epinephrine device actually multiple however is some are  expired.  She does continue to avoid the 3 medications that she received during delivery as she has not needed these medications again including Hemabate, Lomotil and Lysteda.  She required a C-section in 2018 which she then had a hemorrhage and after administration of these medications she developed hives, swelling of the eyes and throat.  Component     Latest Ref Rng 02/05/2018  IgE (Immunoglobulin E), Serum     6 - 495 IU/mL 16   D Pteronyssinus IgE     Class 0 kU/L <0.10   D Farinae IgE     Class 0 kU/L <0.10   Cat Dander IgE     Class 0 kU/L <0.10   Dog Dander IgE     Class 0 kU/L <0.10   [place] Grass IgE     Class 0/I kU/L 0.10 !   Timothy Grass IgE     Class 0 kU/L <0.10   Johnson Grass IgE     Class 0 kU/L <0.10   Cockroach, German IgE     Class 0 kU/L <0.10   Penicillium Chrysogen IgE     Class 0 kU/L <0.10   Cladosporium Herbarum IgE     Class 0 kU/L <0.10   Aspergillus Fumigatus IgE     Class 0 kU/L <0.10   Alternaria Alternata IgE     Class 0 kU/L <0.10  Maple/Box Elder IgE     Class I kU/L 0.35 !   Common Silver Charletta Cousin IgE     Class 0 kU/L <0.10   Rapids, [place] IgE     Class 0/I kU/L 0.11 !   Oak, White IgE     Class 0 kU/L <0.10   Elm, American IgE     Class I kU/L 0.38 !   Cottonwood IgE     Class 0 kU/L <0.10   Pecan, Hickory IgE     Class 0 kU/L <0.10   White Mulberry IgE     Class 0 kU/L <0.10   Ragweed, Short IgE     Class 0 kU/L <0.10   Pigweed, Rough IgE     Class 0 kU/L <0.10   Sheep Sorrel IgE Qn     Class 0/I kU/L 0.20 !   Mouse Urine IgE     Class 0 kU/L <0.10   Peanut IgE     Class III kU/L 1.65 !   Hazelnut (Filbert) IgE     Class II kU/L 1.30 !   [place] Nut IgE     Class 0 kU/L <0.10   F020-IgE Almond     Class II kU/L 0.58 !   Pecan Nut IgE     Class 0/I kU/L 0.23 !   F202-IgE Cashew Nut     Class 0 kU/L <0.10   Walnut IgE     Class III kU/L 1.56 !   Allergen, Peach f95     Class IV kU/L 4.60 !   Milk IgE      Class 0 kU/L <0.10      Review of systems: Review of Systems  Constitutional: Negative.   HENT: Negative.    Eyes: Negative.   Respiratory: Negative.    Cardiovascular: Negative.   Gastrointestinal: Negative.   Musculoskeletal: Negative.   Skin: Negative.   Allergic/Immunologic: Negative.   Neurological: Negative.     All other systems negative unless noted above in HPI  Past medical history: Past Medical History:  Diagnosis Date   Abnormal Pap smear    had colpo in past, normal pap since then   Eczema    GERD (gastroesophageal reflux disease) Dx 2010   Headache(784.0)    migraine   History of oral herpes simplex infection 05/29/2017   +IgM 1/2 antibody screen done for cold sores vs aphthous ulcers   IBS (irritable bowel syndrome)    Multiple allergies    Urticaria     Past surgical history: Past Surgical History:  Procedure Laterality Date   CESAREAN SECTION N/A 11/26/2013   Procedure: CESAREAN SECTION;  Surgeon: Willodean Rosenthal, MD;  Location: [facility];  Service: Obstetrics;  Laterality: N/A;   CESAREAN SECTION N/A 08/17/2017   Procedure: CESAREAN SECTION;  Surgeon: Tilda Burrow, MD;  Location: [facility] BIRTHING SUITES;  Service: Obstetrics;  Laterality: N/A;    Family history:  Family History  Problem Relation Age of Onset   Hypothyroidism Mother    Arthritis Mother    Allergic rhinitis Father    Asthma Brother    Allergic rhinitis Brother    Breast cancer Neg Hx     Social history: Lives in a home with carpeting in the bedroom with gas heating and central cooling.  Fish in the home.  Dogs outside the home.  No concern for water damage, mildew or roaches in the home.  She is a Ecologist.  Denies a smoking history.    Medication List:  Current Outpatient Medications  Medication Sig Dispense Refill   cyclobenzaprine (FLEXERIL) 5 MG tablet Take 1 tablet (5 mg total) by mouth 3 (three) times daily as needed for muscle spasms. 60 tablet 3    diclofenac Sodium (VOLTAREN) 1 % GEL Apply 2 g topically 4 (four) times daily. 200 g 1   EPINEPHrine 0.3 mg/0.3 mL IJ SOAJ injection Inject 0.3 mg into the muscle as needed. 2 each 0   levocetirizine (XYZAL ALLERGY 24HR) 5 MG tablet Take 1 tablet (5 mg total) by mouth every evening. 90 tablet 3   SUMAtriptan (IMITREX) 50 MG tablet TAKE 1 TABLET BY MOUTH AT ONSET OF HEADACHE. MAY REPEAT IN 2 HOURS IF HEADACHE PERSISTS OR RECURS. 12 tablet 6   cetirizine (ZYRTEC) 10 MG tablet Take 1 (one) tablet by mouth daily 30 tablet 11   traZODone (DESYREL) 100 MG tablet Take 1 tablet (100 mg total) by mouth at bedtime as needed for sleep. (Patient not taking: Reported on 01/16/2023) 90 tablet 1   No current facility-administered medications for this visit.    Known medication allergies: Allergies  Allergen Reactions   Other Anaphylaxis    Walnuts   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 Uncertain what caused reaction.   Lomotil [Diphenoxylate] Itching and Swelling    Had allergic reaction after Lomotil, Lysteda and Hemabate. Uncertain what caused reaction.   Lysteda [Tranexamic Acid] Itching and Swelling    Had allergic reaction after Lomotil, Lysteda and Hemabate. Uncertain what caused reaction.     Physical examination: Blood pressure 110/70, pulse 63, temperature 98.4 F (36.9 C), temperature source Temporal, resp. rate 16, height 5' 0.63" (1.54 m), weight 143 lb 9.6 oz (65.1 kg), SpO2 99 %.  General: Alert, interactive, in no acute distress. HEENT: PERRLA, TMs pearly gray, turbinates minimally edematous without discharge, post-pharynx non erythematous. Neck: Supple without lymphadenopathy. Lungs: Clear to auscultation without wheezing, rhonchi or rales. {no increased work of breathing. CV: Normal S1, S2 without murmurs. Abdomen:  Nondistended, nontender. Skin: Warm and dry, without lesions or rashes. Extremities:  No clubbing, cyanosis or edema. Neuro:   Grossly intact.  Diagnositics/Labs: None today Interested in retesting likely later in the year  Assessment and plan: Food Allergy    - continue avoidance measures for peanut, tree nuts you avoid as well as peaches/stone fruits, sunflower seed    - have access to self-injectable epinephrine Epipen 0.3mg  at all times    - follow emergency action plan in case of allergic reaction    - recommend repeat testing in the future    -  discussed if having a reaction that is involving more than just her skin (itching, rash, swelling) then would recommend epinephrine use.  Discussed she can take prednisone 20 mg would be appropriate dose if she does have allergic reaction symptoms to help minimize risk of a biphasic reaction.    Allergic rhinitis Urticaria Pruritus    - continue avoidance measures for grass pollen, tree pollen, weed pollen.     - recommend updating environmental allergy testing    - continue Xyzal 5mg  daily.  May take additional dose if needed for itching or hives.      - if having nasal congestion can use nasal steroid spray like Nasacort or Rhinocort 2 sprays each nostril daily as needed.  Use for 1-2 weeks at a time before stopping  once symptoms.     - if having runny nose can use nasal antihistamine Astelin 2 sprays each nostril twice a day as needed    - if having itchy/watery eyes can use Pataday 1 drop each eye daily as needed.  Drug reaction     - at this time continue avoidance of the medications (Hemabate, Lysteda and Lomotil).  There is no standardized testing for any of these medications.  Graded drug challenge in-office may be an option to rule-out allergy especially for Lomotil as this does come as a orally administrated medication.   Follow-up in 6 to 12 months or sooner if needed.  I appreciate the opportunity to take part in Nell's  care. Please do not hesitate to contact me with questions.  Sincerely,   Margo Aye, MD Allergy/Immunology [facility]

## 2023-01-16 NOTE — Patient Instructions (Addendum)
Food Allergy    - continue avoidance measures for peanut, tree nuts you avoid as well as peaches/stone fruits, sunflower seed    - have access to self-injectable epinephrine Epipen 0.3mg  at all times    - follow emergency action plan in case of allergic reaction    - recommend repeat testing in the future    -  discussed if having a reaction that is involving more than just her skin (itching, rash, swelling) then would recommend epinephrine use.  Discussed she can take prednisone 20 mg would be appropriate dose if she does have allergic reaction symptoms to help minimize risk of a biphasic reaction.    Allergic rhinitis Urticaria Pruritus    - continue avoidance measures for grass pollen, tree pollen, weed pollen.     - recommend updating environmental allergy testing    - continue Xyzal  daily.  May take additional dose if needed for itching or hives.      - if having nasal congestion can use nasal steroid spray like Nasacort or Rhinocort 2 sprays each nostril daily as needed.  Use for 1-2 weeks at a time before stopping once symptoms.     - if having runny nose can use nasal antihistamine Astelin 2 sprays each nostril twice a day as needed    - if having itchy/watery eyes can use Pataday 1 drop each eye daily as needed.  Drug reaction     - at this time continue avoidance of the medications (Hemabate, Lysteda and Lomotil).  There is no standardized testing for any of these medications.  Graded drug challenge in-office may be an option to rule-out allergy especially for Lomotil as this does come as a orally administrated medication.   Follow-up in 6 to 12 months or sooner if needed.

## 2023-01-20 ENCOUNTER — Other Ambulatory Visit: Payer: Self-pay

## 2023-01-27 ENCOUNTER — Other Ambulatory Visit: Payer: Self-pay

## 2023-02-07 ENCOUNTER — Other Ambulatory Visit: Payer: Self-pay

## 2023-02-11 ENCOUNTER — Encounter: Payer: Self-pay | Admitting: Plastic Surgery

## 2023-02-11 ENCOUNTER — Ambulatory Visit (INDEPENDENT_AMBULATORY_CARE_PROVIDER_SITE_OTHER): Payer: Self-pay | Admitting: Plastic Surgery

## 2023-02-11 VITALS — BP 127/85 | HR 68 | Ht 61.0 in | Wt 145.0 lb

## 2023-02-11 DIAGNOSIS — L719 Rosacea, unspecified: Secondary | ICD-10-CM

## 2023-02-11 NOTE — Progress Notes (Signed)
Patient ID: Rhonda Graves, female    DOB: 29-Jul-1981, 42 y.o.   MRN: [id number]   Chief Complaint  Patient presents with   Consult    It is a 42 year old female here for evaluation of her face.  She has rosacea mostly in the medial midline aspect of her face on her cheek glabella area and chin.  She says her father has it even worse.  She was exposed to the sun quite a bit as a child as she grew up in [place].  She has tried over-the-counter creams without any long-term benefits.  She is interested in seeing if laser would be of benefit to her.  She is also interested in skin care routine.  She is likely a Fitzpatrick 3.    Review of Systems  Constitutional: Negative.   Eyes: Negative.   Respiratory: Negative.  Negative for chest tightness and shortness of breath.   Cardiovascular: Negative.   Gastrointestinal: Negative.   Genitourinary: Negative.   Musculoskeletal: Negative.   Skin:  Positive for color change.    Past Medical History:  Diagnosis Date   Abnormal Pap smear    had colpo in past, normal pap since then   Eczema    GERD (gastroesophageal reflux disease) Dx 2010   Headache(784.0)    migraine   History of oral herpes simplex infection 05/29/2017   +IgM 1/2 antibody screen done for cold sores vs aphthous ulcers   IBS (irritable bowel syndrome)    Multiple allergies    Urticaria     Past Surgical History:  Procedure Laterality Date   CESAREAN SECTION N/A 11/26/2013   Procedure: CESAREAN SECTION;  Surgeon: Willodean Rosenthal, MD;  Location: [facility];  Service: Obstetrics;  Laterality: N/A;   CESAREAN SECTION N/A 08/17/2017   Procedure: CESAREAN SECTION;  Surgeon: Tilda Burrow, MD;  Location: [facility] BIRTHING SUIT[facility];  Service: Obstetrics;  Laterality: N/A;      Current Outpatient Medications:    cyclobenzaprine (FLEXERIL) 5 MG tablet, Take 1 tablet (5 mg total) by mouth 3 (three) times daily as needed for muscle spasms., Disp: 60 tablet, Rfl: 3   diclofenac  Sodium (VOLTAREN) 1 % GEL, Apply 2 g topically 4 (four) times daily., Disp: 200 g, Rfl: 1   EPINEPHrine 0.3 mg/0.3 mL IJ SOAJ injection, Inject 0.3 mg into the muscle as needed., Disp: 2 each, Rfl: 0   levocetirizine (XYZAL ALLERGY 24HR) 5 MG tablet, Take 1 tablet (5 mg total) by mouth every evening., Disp: 90 tablet, Rfl: 3   SUMAtriptan (IMITREX) 50 MG tablet, TAKE 1 TABLET BY MOUTH AT ONSET OF HEADACHE. MAY REPEAT IN 2 HOURS IF HEADACHE PERSISTS OR RECURS., Disp: 12 tablet, Rfl: 6   traZODone (DESYREL) 100 MG tablet, Take 1 tablet (100 mg total) by mouth at bedtime as needed for sleep., Disp: 90 tablet, Rfl: 1   cetirizine (ZYRTEC) 10 MG tablet, Take 1 (one) tablet by mouth daily, Disp: 30 tablet, Rfl: 11   Objective:   Vitals:   02/11/23 0838  BP: 127/85  Pulse: 68  SpO2: 99%    Physical Exam Vitals and nursing note reviewed.  Constitutional:      Appearance: Normal appearance.  HENT:     Head: Normocephalic and atraumatic.  Cardiovascular:     Rate and Rhythm: Normal rate.     Pulses: Normal pulses.  Pulmonary:     Effort: Pulmonary effort is normal.  Musculoskeletal:        General: No swelling,  deformity or signs of injury.  Skin:    General: Skin is warm.     Capillary Refill: Capillary refill takes less than 2 seconds.  Neurological:     Mental Status: She is alert and oriented to person, place, and time.  Psychiatric:        Mood and Affect: Mood normal.        Behavior: Behavior normal.        Thought Content: Thought content normal.        Judgment: Judgment normal.     Assessment & Plan:  Rosacea  The patient is a good candidate for rosacea treatment with the BBL.  We have given her a quote and some information.  We also provided her with a consult with Lilly.  We will see her back for the treatment.  Pictures were obtained of the patient and placed in the chart with the patient's or guardian's permission.   Alena Bills Roy Snuffer, DO

## 2023-02-28 ENCOUNTER — Other Ambulatory Visit: Payer: Self-pay

## 2023-03-03 ENCOUNTER — Ambulatory Visit: Payer: Self-pay

## 2023-03-10 ENCOUNTER — Ambulatory Visit: Payer: Self-pay

## 2023-03-11 ENCOUNTER — Encounter: Payer: Self-pay | Admitting: Gastroenterology

## 2023-03-11 ENCOUNTER — Other Ambulatory Visit: Payer: Self-pay

## 2023-03-11 ENCOUNTER — Ambulatory Visit (INDEPENDENT_AMBULATORY_CARE_PROVIDER_SITE_OTHER): Payer: Self-pay | Admitting: Gastroenterology

## 2023-03-11 VITALS — BP 126/84 | HR 70 | Ht 61.0 in | Wt 143.0 lb

## 2023-03-11 DIAGNOSIS — Z800 Family history of malignant neoplasm of digestive organs: Secondary | ICD-10-CM

## 2023-03-11 DIAGNOSIS — K580 Irritable bowel syndrome with diarrhea: Secondary | ICD-10-CM

## 2023-03-11 MED ORDER — DICYCLOMINE HCL 10 MG PO CAPS
10.0000 mg | ORAL_CAPSULE | Freq: Two times a day (BID) | ORAL | 1 refills | Status: DC
  Filled 2023-03-11: qty 60, 30d supply, fill #0

## 2023-03-11 NOTE — Progress Notes (Signed)
Chief Complaint: To get established  Referring Provider:  Claiborne Rigg, NP      ASSESSMENT AND PLAN;   #1. IBS-D w/t bloating.  Exacerbated by Mg  #2. Abn ALT- likely d/t fatty liver (wt gain/ETOH)  #3. FH CRC (GM at age 42)  Plan: -Stop chewing gums, wine, ibuprofen -Stop Mg, fish oil -dicyclomine 10mg  BID #90 -Wants to hold off on US/colon or further workup at this time (d/t lack of INS) -CBC, CMP, TSH, fasting lipid profile in 6 weeks -FU in 12 weeks.  If still with problems, further workup -Encouraged her to sign up for Obama-care   HPI:    Rhonda Graves is a 42 y.o. female  With H/O migraines, anxiety, urticaria, neg celiac screen, neg SB Bx for celiac, significant allergies  C/O longstanding H/O diarrhea (>15 yrs) Recently getting worse Used to be mostly after eating, now can happen at any time with urgency without nocturnal symptoms Rare constipation (she used to get more constipation previously) Has been taking magnesium supplements at night and fish oil.  Also complains of abdominal bloating and significant gas-at times foul-smelling. Has generalized abdominal discomfort when bloated.  No nausea vomiting heartburn regurgitation odynophagia or dysphagia.  No fever chills or night sweats.  No jaundice dark urine or pale stools.  No family history of liver problems  Has gained weight as below.  Used to be 125 pounds in 2012  She has been drinking 1 glass of wine per day-used to be more in pandemic. Drinking 1 soda per day Chewing gums And using ibuprofen as needed  No fever chills or night sweats.  We have gone over her labs over the last 10 years Shows increased ALT     Latest Ref Rng & Units 11/22/2022    8:55 AM 06/12/2021    8:32 AM 06/16/2020   10:55 AM  Hepatic Function  Total Protein 6.0 - 8.5 g/dL 6.8  6.8  6.8   Albumin 3.9 - 4.9 g/dL 4.5  4.4  4.6   AST 0 - 40 IU/L 30  28  29    ALT 0 - 32 IU/L 42  28  39   Alk Phosphatase 44 - 121  IU/L 87  80  78   Total Bilirubin 0.0 - 1.2 mg/dL 0.8  0.3  0.5     Unfortunately, since she does not have any health insurance, she would like to minimize testing.  Would like to hold off on ultrasound/colonoscopy/extensive blood testing for now.   FH (GM at 56s with CRC)-mom or dad did not have any polyps.  Mom with IBS.  Brother with IBS.    Wt Readings from Last 3 Encounters:  03/11/23 143 lb (64.9 kg)  02/11/23 145 lb (65.8 kg)  01/16/23 143 lb 9.6 oz (65.1 kg)   Past GI workup: EGD 05/23/2011 -Mild gastritis -Neg CLO -Neg SB Bx for celiac.  [place] 2012: neg HIDA with EF 2012: neg  Also had negative H. pylori breath test Negative celiac screen serology.    SH- married.  Has 2 daughters  Past Medical History:  Diagnosis Date   Abnormal Pap smear    had colpo in past, normal pap since then   Anxiety    Eczema    GERD (gastroesophageal reflux disease) Dx 2010   Headache(784.0)    migraine   History of oral herpes simplex infection 05/29/2017   +IgM 1/2 antibody screen done for cold sores vs aphthous ulcers   IBS (  irritable bowel syndrome)    Multiple allergies    Urticaria     Past Surgical History:  Procedure Laterality Date   CESAREAN SECTION N/A 11/26/2013   Procedure: CESAREAN SECTION;  Surgeon: Willodean Rosenthal, MD;  Location: [facility];  Service: Obstetrics;  Laterality: N/A;   CESAREAN SECTION N/A 08/17/2017   Procedure: CESAREAN SECTION;  Surgeon: Tilda Burrow, MD;  Location: [facility] BIRTHING SUITES;  Service: Obstetrics;  Laterality: N/A;    Family History  Problem Relation Age of Onset   Hypothyroidism Mother    Arthritis Mother    Allergic rhinitis Father    Asthma Brother    Allergic rhinitis Brother    Colon cancer Paternal Grandmother    Breast cancer Neg Hx     Social History   Tobacco Use   Smoking status: Never    Passive exposure: Never   Smokeless tobacco: Never  Vaping Use   Vaping Use: Never used  Substance Use Topics    Alcohol use: Yes    Comment: occasionally    Drug use: No    Current Outpatient Medications  Medication Sig Dispense Refill   cyclobenzaprine (FLEXERIL) 5 MG tablet Take 1 tablet (5 mg total) by mouth 3 (three) times daily as needed for muscle spasms. 60 tablet 3   diclofenac Sodium (VOLTAREN) 1 % GEL Apply 2 g topically 4 (four) times daily. 200 g 1   EPINEPHrine 0.3 mg/0.3 mL IJ SOAJ injection Inject 0.3 mg into the muscle as needed. 2 each 0   levocetirizine (XYZAL ALLERGY 24HR) 5 MG tablet Take 1 tablet (5 mg total) by mouth every evening. 90 tablet 3   Melatonin 10 MG TABS Take 1 tablet by mouth daily.     SUMAtriptan (IMITREX) 50 MG tablet TAKE 1 TABLET BY MOUTH AT ONSET OF HEADACHE. MAY REPEAT IN 2 HOURS IF HEADACHE PERSISTS OR RECURS. 12 tablet 6   No current facility-administered medications for this visit.    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 Uncertain what caused reaction.   Lomotil [Diphenoxylate] Itching and Swelling    Had allergic reaction after Lomotil, Lysteda and Hemabate. Uncertain what caused reaction.   Lysteda [Tranexamic Acid] Itching and Swelling    Had allergic reaction after Lomotil, Lysteda and Hemabate. Uncertain what caused reaction.    Review of Systems:  Constitutional: Denies fever, chills, diaphoresis, appetite change and fatigue.  HEENT: Has multiple allergies. Respiratory: Denies SOB, DOE, cough, chest tightness,  and wheezing.   Cardiovascular: Denies chest pain, palpitations and leg swelling.  Genitourinary: Denies dysuria, urgency, frequency, hematuria, flank pain and difficulty urinating.  Musculoskeletal: Denies myalgias, back pain, joint swelling, arthralgias and gait problem.  Skin: No rash.  Neurological: Denies  dizziness, seizures, syncope, weakness, light-headedness, numbness and has headaches.  Hematological: Denies adenopathy. Easy bruising, personal or family bleeding history  Psychiatric/Behavioral: has anxiety or depression     Physical Exam:    BP (!) 142/76   Pulse 70   Ht 5\' 1"  (1.549 m)   Wt 143 lb (64.9 kg)   BMI 27.02 kg/m  Wt Readings from Last 3 Encounters:  03/11/23 143 lb (64.9 kg)  02/11/23 145 lb (65.8 kg)  01/16/23 143 lb 9.6 oz (65.1 kg)   Constitutional:  Well-developed, in no acute distress.  Psychiatric: Normal mood and affect. Behavior is normal. HEENT: Pupils normal.  Conjunctivae are normal. No scleral icterus. Cardiovascular: Normal rate, regular rhythm. No edema Pulmonary/chest: Effort normal and breath sounds normal. No wheezing, rales or rhonchi. Abdominal: Soft, nondistended. Nontender. Bowel sounds active throughout. There are no masses palpable. No hepatomegaly. Rectal: Deferred Neurological: Alert and oriented to person place and time. Skin: Skin is warm and dry. No rashes noted.  Data Reviewed: I have personally reviewed following labs and imaging studies  CBC:    Latest Ref Rng & Units 11/22/2022    8:55 AM 06/12/2021    8:32 AM 06/16/2020   10:55 AM  CBC  WBC 3.4 - 10.8 x10E3/uL 4.8  5.1  5.0   Hemoglobin 11.1 - 15.9 g/dL 40.9  81.1  91.4   Hematocrit 34.0 - 46.6 % 37.2  39.0  38.2   Platelets 150 - 450 x10E3/uL 256  233  236     CMP:    Latest Ref Rng & Units 11/22/2022    8:55 AM 06/12/2021    8:32 AM 06/16/2020   10:55 AM  CMP  Glucose 70 - 99 mg/dL 91  85  90   BUN 6 - 24 mg/dL 12  10  14    Creatinine 0.57 - 1.00 mg/dL 7.82  9.56  2.13   Sodium 134 - 144 mmol/L 139  139  138   Potassium 3.5 - 5.2 mmol/L 4.0  4.4  4.4   Chloride 96 - 106 mmol/L 104  102  104   CO2 20 - 29 mmol/L 21  23  21    Calcium 8.7 - 10.2 mg/dL 9.4  9.6  9.1   Total Protein 6.0 - 8.5 g/dL 6.8  6.8  6.8   Total Bilirubin 0.0 - 1.2 mg/dL 0.8  0.3  0.5   Alkaline  Phos 44 - 121 IU/L 87  80  78   AST 0 - 40 IU/L 30  28  29    ALT 0 - 32 IU/L 42  28  39         Edman Circle, MD 03/11/2023, 2:19 PM  Cc: Claiborne Rigg, NP

## 2023-03-11 NOTE — Patient Instructions (Addendum)
_______________________________________________________  If your blood pressure at your visit was 140/90 or greater, please contact your primary care physician to follow up on this.  _______________________________________________________  If you are age 41 or older, your body mass index should be between 23-30. Your Body mass index is 27.02 kg/m. If this is out of the aforementioned range listed, please consider follow up with your Primary Care Provider.  If you are age 55 or younger, your body mass index should be between 19-25. Your Body mass index is 27.02 kg/m. If this is out of the aformentioned range listed, please consider follow up with your Primary Care Provider.   ________________________________________________________  The [facility] providers would like to encourage you to use [facility] to communicate with providers for non-urgent requests or questions.  Due to long hold times on the telephone, sending your provider a message by [facility] may be a faster and more efficient way to get a response.  Please allow 48 business hours for a response.  Please remember that this is for non-urgent requests.  _______________________________________________________  Stop chewing gum, wine and ibuprofen magnesium and fish oil.  Your provider has requested that you go to the basement level for lab work in 6 weeks. Please come after 04-22-2023 with nothing to eat after midnight prior to appointment.  Press "B" on the elevator. The lab is located at the first door on the left as you exit the elevator.  You have been scheduled for an appointment with Dr. Chales Abrahams on  06-02-2023 at 4pm . Please arrive 10 minutes early for your appointment.  We have sent the following medications to your pharmacy for you to pick up at your convenience: Bentyl   Thank you,  Dr. Lynann Bologna

## 2023-03-17 ENCOUNTER — Ambulatory Visit: Payer: Self-pay

## 2023-03-18 ENCOUNTER — Encounter: Payer: Self-pay | Admitting: Gastroenterology

## 2023-03-24 ENCOUNTER — Ambulatory Visit: Payer: Self-pay

## 2023-03-24 NOTE — Telephone Encounter (Signed)
Absolutely. Please restart at a lower dose RG

## 2023-03-25 ENCOUNTER — Other Ambulatory Visit: Payer: Self-pay

## 2023-04-17 ENCOUNTER — Other Ambulatory Visit: Payer: Self-pay

## 2023-04-17 DIAGNOSIS — Z719 Counseling, unspecified: Secondary | ICD-10-CM

## 2023-05-13 ENCOUNTER — Other Ambulatory Visit: Payer: Self-pay | Admitting: Nurse Practitioner

## 2023-05-13 ENCOUNTER — Other Ambulatory Visit: Payer: Self-pay

## 2023-05-13 ENCOUNTER — Ambulatory Visit (INDEPENDENT_AMBULATORY_CARE_PROVIDER_SITE_OTHER): Payer: Self-pay | Admitting: Plastic Surgery

## 2023-05-13 DIAGNOSIS — Z719 Counseling, unspecified: Secondary | ICD-10-CM

## 2023-05-13 DIAGNOSIS — L719 Rosacea, unspecified: Secondary | ICD-10-CM

## 2023-05-13 MED ORDER — MELATONIN 10 MG PO TABS
1.0000 | ORAL_TABLET | Freq: Every day | ORAL | 1 refills | Status: DC
  Filled 2023-05-13: qty 90, fill #0

## 2023-05-13 NOTE — Progress Notes (Signed)
Preoperative Dx: hyperpigmentation and redness of face  Postoperative Dx:  same  Procedure: laser to face   Anesthesia: none  Description of Procedure:  Risks and complications were explained to the patient. Consent was confirmed and signed. Eye protection was placed. Time out was called and all information was confirmed to be correct. The area  area was prepped with alcohol and wiped dry. The Heroic laser was set at 532 nm. The face was lasered. The patient tolerated the procedure well and there were no complications. The patient is to follow up in 4 weeks.

## 2023-05-19 ENCOUNTER — Ambulatory Visit (INDEPENDENT_AMBULATORY_CARE_PROVIDER_SITE_OTHER): Payer: Self-pay

## 2023-05-19 DIAGNOSIS — Z719 Counseling, unspecified: Secondary | ICD-10-CM

## 2023-05-22 ENCOUNTER — Encounter: Payer: Self-pay | Admitting: Nurse Practitioner

## 2023-05-22 ENCOUNTER — Other Ambulatory Visit: Payer: Self-pay | Admitting: Obstetrics and Gynecology

## 2023-05-22 DIAGNOSIS — Z1231 Encounter for screening mammogram for malignant neoplasm of breast: Secondary | ICD-10-CM

## 2023-05-27 ENCOUNTER — Telehealth: Payer: Self-pay | Admitting: Allergy

## 2023-05-27 ENCOUNTER — Other Ambulatory Visit: Payer: Self-pay | Admitting: Nurse Practitioner

## 2023-05-27 DIAGNOSIS — L719 Rosacea, unspecified: Secondary | ICD-10-CM

## 2023-05-27 NOTE — Telephone Encounter (Signed)
Rhonda Graves came into the office to update her insurance information.  Rhonda Graves now has [employer].  Rhonda Graves is wanting to know you are wanting her to get skin testing and do the drug challenge for Lomotil, or do you just want to do the drug challenge?  She is asking for clarification, but states she is willing to do both.

## 2023-05-28 ENCOUNTER — Other Ambulatory Visit: Payer: Self-pay

## 2023-05-29 ENCOUNTER — Other Ambulatory Visit (INDEPENDENT_AMBULATORY_CARE_PROVIDER_SITE_OTHER): Payer: Medicaid Other

## 2023-05-29 DIAGNOSIS — K580 Irritable bowel syndrome with diarrhea: Secondary | ICD-10-CM | POA: Diagnosis not present

## 2023-05-29 DIAGNOSIS — Z800 Family history of malignant neoplasm of digestive organs: Secondary | ICD-10-CM

## 2023-05-29 LAB — LIPID PANEL
Cholesterol: 189 mg/dL (ref 0–200)
HDL: 74.9 mg/dL (ref >39.00)
LDL Cholesterol: 97 mg/dL (ref 0–99)
NonHDL: 113.96
Total CHOL/HDL Ratio: 3
Triglycerides: 83 mg/dL (ref 0.0–149.0)
VLDL: 16.6 mg/dL (ref 0.0–40.0)

## 2023-05-29 LAB — CBC WITH DIFFERENTIAL/PLATELET
Basophils Absolute: 0.1 10*3/uL (ref 0.0–0.1)
Basophils Relative: 1 % (ref 0.0–3.0)
Eosinophils Absolute: 0.2 10*3/uL (ref 0.0–0.7)
Eosinophils Relative: 3.9 % (ref 0.0–5.0)
HCT: 37.7 % (ref 36.0–46.0)
Hemoglobin: 12.5 g/dL (ref 12.0–15.0)
Lymphocytes Relative: 33 % (ref 12.0–46.0)
Lymphs Abs: 1.7 10*3/uL (ref 0.7–4.0)
MCHC: 33.2 g/dL (ref 30.0–36.0)
MCV: 96.4 fl (ref 78.0–100.0)
Monocytes Absolute: 0.6 10*3/uL (ref 0.1–1.0)
Monocytes Relative: 11 % (ref 3.0–12.0)
Neutro Abs: 2.7 10*3/uL (ref 1.4–7.7)
Neutrophils Relative %: 51.1 % (ref 43.0–77.0)
Platelets: 228 10*3/uL (ref 150.0–400.0)
RBC: 3.91 Mil/uL (ref 3.87–5.11)
RDW: 12.8 % (ref 11.5–15.5)
WBC: 5.3 10*3/uL (ref 4.0–10.5)

## 2023-05-29 LAB — COMPREHENSIVE METABOLIC PANEL
ALT: 58 U/L — ABNORMAL HIGH (ref 0–35)
AST: 39 U/L — ABNORMAL HIGH (ref 0–37)
Albumin: 4.1 g/dL (ref 3.5–5.2)
Alkaline Phosphatase: 67 U/L (ref 39–117)
BUN: 12 mg/dL (ref 6–23)
CO2: 26 meq/L (ref 19–32)
Calcium: 9.3 mg/dL (ref 8.4–10.5)
Chloride: 105 meq/L (ref 96–112)
Creatinine, Ser: 0.75 mg/dL (ref 0.40–1.20)
GFR: 99.07 mL/min (ref >60.00)
Glucose, Bld: 84 mg/dL (ref 70–99)
Potassium: 4.2 meq/L (ref 3.5–5.1)
Sodium: 137 meq/L (ref 135–145)
Total Bilirubin: 0.5 mg/dL (ref 0.2–1.2)
Total Protein: 6.9 g/dL (ref 6.0–8.3)

## 2023-05-29 LAB — TSH: TSH: 3.32 u[IU]/mL (ref 0.35–5.50)

## 2023-06-02 ENCOUNTER — Ambulatory Visit: Payer: Medicaid Other | Admitting: Gastroenterology

## 2023-06-02 ENCOUNTER — Other Ambulatory Visit (INDEPENDENT_AMBULATORY_CARE_PROVIDER_SITE_OTHER): Payer: Medicaid Other

## 2023-06-02 ENCOUNTER — Encounter: Payer: Self-pay | Admitting: Gastroenterology

## 2023-06-02 VITALS — BP 132/80 | HR 72 | Ht 61.0 in | Wt 143.0 lb

## 2023-06-02 DIAGNOSIS — K580 Irritable bowel syndrome with diarrhea: Secondary | ICD-10-CM | POA: Diagnosis not present

## 2023-06-02 DIAGNOSIS — Z800 Family history of malignant neoplasm of digestive organs: Secondary | ICD-10-CM

## 2023-06-02 DIAGNOSIS — R7989 Other specified abnormal findings of blood chemistry: Secondary | ICD-10-CM

## 2023-06-02 NOTE — Patient Instructions (Signed)
You have been scheduled for a colonoscopy. Please follow written instructions given to you at your visit today.   Please pick up your prep supplies at the pharmacy within the next 1-3 days.  If you use inhalers (even only as needed), please bring them with you on the day of your procedure.  DO NOT TAKE 7 DAYS PRIOR TO TEST- Trulicity (dulaglutide) Ozempic, Wegovy (semaglutide) Mounjaro (tirzepatide) Bydureon Bcise (exanatide extended release)  DO NOT TAKE 1 DAY PRIOR TO YOUR TEST Rybelsus (semaglutide) Adlyxin (lixisenatide) Victoza (liraglutide) Byetta (exanatide) ___________________________________________________________________________  Due to recent changes in healthcare laws, you may see the results of your imaging and laboratory studies on MyChart before your provider has had a chance to review them.  We understand that in some cases there may be results that are confusing or concerning to you. Not all laboratory results come back in the same time frame and the provider may be waiting for multiple results in order to interpret others.  Please give [place] 48 hours in order for your provider to thoroughly review all the results before contacting the office for clarification of your results.   You have been scheduled for an abdominal ultrasound at [facility] (1st floor of hospital) on 06-05-2023 at 830am. Please arrive 30 minutes prior to your appointment for registration. Make certain not to have anything to eat or drink 6 hours prior to your appointment. Should you need to reschedule your appointment, please contact radiology at [phone]. This test typically takes about 30 minutes to perform.   Your provider has requested that you go to the basement level for lab work before leaving today. Press "B" on the elevator. The lab is located at the first door on the left as you exit the elevator.   It was a pleasure to see you today!  Thank you for trusting me with your  gastrointestinal care!

## 2023-06-02 NOTE — Progress Notes (Signed)
Chief Complaint: To get established  Referring Provider:  Claiborne Rigg, NP      ASSESSMENT AND PLAN;   #1. IBS-D w/t bloating.  Exacerbated by Mg  #2. Abn AST/ALT- likely d/t fatty liver (wt gain/ETOH)  #3. FH CRC (GM at age 42)  Plan:  -Continue dicyclomine 10mg  BID #90 -[place] abdo -Colon with miralax. -Check acute viral hepatitis, autoimmune hepatitis panel (AMA, ASMA), iron studies, serum ceruloplasmin, A1AT, celiac screen and GGT. -Check anti-HAV total Ab and HBsAb.  If neg, would recommend vaccination for hepatitis A and B. -Encouraged wt loss. Stop all ETOH -FU in 6 months   Discussed risks & benefits of colonoscopy. Risks including rare perforation req laparotomy, bleeding after bx/polypectomy req blood transfusion, rarely missing neoplasms, risks of anesthesia/sedation, rare risk of damage to internal organs. Benefits outweigh the risks. Patient agrees to proceed. All the questions were answered. Pt consents to proceed.  HPI:    Rhonda Graves is a 42 y.o. female  With H/O migraines, anxiety, urticaria, neg celiac screen, neg SB Bx for celiac, significant allergies Has new insurance  Stopped ETOH completely May 01, 2023 Most recent LFTs as below showed mildly elevated AST/ALT. She is willing to get further evaluation  Also willing to get colonoscopy  C/O longstanding H/O diarrhea (>15 yrs) Recently getting worse after taking magnesium supplements.  She gets better if she stops magnesium but then starts having muscle cramps. Rare constipation (she used to get more constipation previously) Has been taking magnesium supplements at night and fish oil.  Also complains of abdominal bloating and significant gas-at times foul-smelling. Has generalized abdominal discomfort when bloated.  No nausea vomiting heartburn regurgitation odynophagia or dysphagia.  No fever chills or night sweats.  No jaundice dark urine or pale stools.  No family history of liver  problems  Has lost 1 pound since last visit.   From previous notes: She has been drinking 1 glass of wine per day-used to be more in pandemic. Drinking 1 soda per day Chewing gums And using ibuprofen as needed  No fever chills or night sweats.      Latest Ref Rng & Units 05/29/2023    7:56 AM 11/22/2022    8:55 AM 06/12/2021    8:32 AM  Hepatic Function  Total Protein 6.0 - 8.3 g/dL 6.9  6.8  6.8   Albumin 3.5 - 5.2 g/dL 4.1  4.5  4.4   AST 0 - 37 U/L 39  30  28   ALT 0 - 35 U/L 58  42  28   Alk Phosphatase 39 - 117 U/L 67  87  80   Total Bilirubin 0.2 - 1.2 mg/dL 0.5  0.8  0.3       FH (GM at 50s with CRC)-mom or dad did not have any polyps.  Mom with IBS.  Brother with IBS.    Wt Readings from Last 3 Encounters:  06/02/23 143 lb (64.9 kg)  03/11/23 143 lb (64.9 kg)  02/11/23 145 lb (65.8 kg)   Past GI workup: EGD 05/23/2011 -Mild gastritis -Neg CLO -Neg SB Bx for celiac.  [place] 2012: neg HIDA with EF 2012: neg  Also had negative H. pylori breath test Neg celiac screen serology.    SH- married.  Has 2 daughters   Wt Readings from Last 3 Encounters:  06/02/23 143 lb (64.9 kg)  03/11/23 143 lb (64.9 kg)  02/11/23 145 lb (65.8 kg)     Past Medical History:  Diagnosis Date   Abnormal Pap smear    had colpo in past, normal pap since then   Anxiety    Depressive disorder    Dysmenorrhea    Eczema    GERD (gastroesophageal reflux disease) Dx 2010   Headache(784.0)    migraine   History of oral herpes simplex infection 05/29/2017   +IgM 1/2 antibody screen done for cold sores vs aphthous ulcers   IBS (irritable bowel syndrome)    Migraine    Multiple allergies    Urticaria     Past Surgical History:  Procedure Laterality Date   CESAREAN SECTION N/A 11/26/2013   Procedure: CESAREAN SECTION;  Surgeon: Willodean Rosenthal, MD;  Location: [facility];  Service: Obstetrics;  Laterality: N/A;   CESAREAN SECTION N/A 08/17/2017   Procedure: CESAREAN  SECTION;  Surgeon: Tilda Burrow, MD;  Location: [facility];  Service: Obstetrics;  Laterality: N/A;   ESOPHAGOGASTRODUODENOSCOPY  05/23/2011   Mild gastritis. Otherwise normal EGD    Family History  Problem Relation Age of Onset   Hypothyroidism Mother    Arthritis Mother    Allergic rhinitis Father    Asthma Brother    Allergic rhinitis Brother    Colon cancer Paternal Grandmother    Breast cancer Neg Hx     Social History   Tobacco Use   Smoking status: Never    Passive exposure: Never   Smokeless tobacco: Never  Vaping Use   Vaping status: Never Used  Substance Use Topics   Alcohol use: Yes    Comment: occasionally    Drug use: No    Current Outpatient Medications  Medication Sig Dispense Refill   dicyclomine (BENTYL) 10 MG capsule Take 10 mg by mouth as needed for spasms.     levocetirizine (XYZAL ALLERGY 24HR) 5 MG tablet Take 1 tablet (5 mg total) by mouth every evening. 90 tablet 3   Melatonin 10 MG TABS Take 1 tablet by mouth daily. 90 tablet 1   cyclobenzaprine (FLEXERIL) 5 MG tablet Take 1 tablet (5 mg total) by mouth 3 (three) times daily as needed for muscle spasms. (Patient not taking: Reported on 06/02/2023) 60 tablet 3   diclofenac Sodium (VOLTAREN) 1 % GEL Apply 2 g topically 4 (four) times daily. (Patient not taking: Reported on 06/02/2023) 200 g 1   dicyclomine (BENTYL) 10 MG capsule Take 1 capsule (10 mg total) by mouth 2 (two) times daily. (Patient not taking: Reported on 06/02/2023) 180 capsule 1   EPINEPHrine 0.3 mg/0.3 mL IJ SOAJ injection Inject 0.3 mg into the muscle as needed. (Patient not taking: Reported on 06/02/2023) 2 each 0   SUMAtriptan (IMITREX) 50 MG tablet TAKE 1 TABLET BY MOUTH AT ONSET OF HEADACHE. MAY REPEAT IN 2 HOURS IF HEADACHE PERSISTS OR RECURS. (Patient not taking: Reported on 06/02/2023) 12 tablet 6   No current facility-administered medications for this visit.    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 Uncertain what caused reaction.   Lomotil [Diphenoxylate] Itching and Swelling    Had allergic reaction after Lomotil, Lysteda and Hemabate. Uncertain what caused reaction.   Lysteda [Tranexamic Acid] Itching and Swelling    Had allergic reaction after Lomotil, Lysteda and Hemabate. Uncertain what caused reaction.    Review of Systems:  Constitutional: Denies fever, chills, diaphoresis, appetite change and fatigue.  HEENT: Has multiple allergies. Respiratory: Denies SOB, DOE, cough, chest tightness,  and wheezing.   Cardiovascular: Denies chest pain, palpitations and leg swelling.  Genitourinary: Denies dysuria, urgency, frequency, hematuria, flank pain and difficulty urinating.  Musculoskeletal: Denies myalgias, back pain, joint swelling, arthralgias and gait problem.  Skin: No rash.  Neurological: Denies dizziness, seizures, syncope, weakness, light-headedness, numbness and has headaches.  Hematological: Denies adenopathy. Easy bruising, personal or family bleeding history  Psychiatric/Behavioral: has anxiety or depression     Physical Exam:    BP 132/80   Pulse 72   Ht 5\' 1"  (1.549 m)   Wt 143 lb (64.9 kg)   BMI 27.02 kg/m  Wt Readings from Last 3 Encounters:  06/02/23 143 lb (64.9 kg)  03/11/23 143 lb (64.9 kg)  02/11/23 145 lb (65.8 kg)   Constitutional:  Well-developed, in no acute distress. Psychiatric: Normal mood and affect. Behavior is normal. HEENT: Pupils normal.  Conjunctivae are normal. No scleral icterus. Cardiovascular: Normal rate, regular rhythm. No edema Pulmonary/chest: Effort normal and breath sounds normal. No wheezing, rales or rhonchi. Abdominal: Soft, nondistended. Nontender. Bowel sounds active throughout. There are no masses palpable. No  hepatomegaly. Rectal: Deferred Neurological: Alert and oriented to person place and time. Skin: Skin is warm and dry. No rashes noted.  Data Reviewed: I have personally reviewed following labs and imaging studies  CBC:    Latest Ref Rng & Units 05/29/2023    7:56 AM 11/22/2022    8:55 AM 06/12/2021    8:32 AM  CBC  WBC 4.0 - 10.5 K/uL 5.3  4.8  5.1   Hemoglobin 12.0 - 15.0 g/dL 01.6  01.0  93.2   Hematocrit 36.0 - 46.0 % 37.7  37.2  39.0   Platelets 150.0 - 400.0 K/uL 228.0  256  233     CMP:    Latest Ref Rng & Units 05/29/2023    7:56 AM 11/22/2022    8:55 AM 06/12/2021    8:32 AM  CMP  Glucose 70 - 99 mg/dL 84  91  85   BUN 6 - 23 mg/dL 12  12  10    Creatinine 0.40 - 1.20 mg/dL 3.55  7.32  2.02   Sodium 135 - 145 mEq/L 137  139  139   Potassium 3.5 - 5.1 mEq/L 4.2  4.0  4.4   Chloride 96 - 112 mEq/L 105  104  102   CO2 19 - 32 mEq/L 26  21  23    Calcium 8.4 - 10.5 mg/dL 9.3  9.4  9.6   Total Protein 6.0 - 8.3 g/dL 6.9  6.8  6.8   Total Bilirubin 0.2 - 1.2 mg/dL 0.5  0.8  0.3   Alkaline Phos 39 - 117 U/L 67  87  80   AST 0 - 37 U/L 39  30  28   ALT 0 - 35 U/L 58  42  28         Edman Circle, MD 06/02/2023, 4:20 PM  Cc: Claiborne Rigg, NP

## 2023-06-03 LAB — ACUTE VIRAL HEPATITIS (HAV, HBV, HCV)
HCV Ab: NONREACTIVE
Hep A IgM: NEGATIVE
Hep B C IgM: NEGATIVE
Hepatitis B Surface Ag: NEGATIVE

## 2023-06-03 LAB — IBC + FERRITIN
Ferritin: 44.1 ng/mL (ref 10.0–291.0)
Iron: 89 ug/dL (ref 42–145)
Saturation Ratios: 29.8 % (ref 20.0–50.0)
TIBC: 298.2 ug/dL (ref 250.0–450.0)
Transferrin: 213 mg/dL (ref 212.0–360.0)

## 2023-06-03 LAB — PROTIME-INR
INR: 1 ratio (ref 0.8–1.0)
Prothrombin Time: 10.2 s (ref 9.6–13.1)

## 2023-06-03 LAB — AMMONIA: Ammonia: 24 umol/L (ref 11–35)

## 2023-06-03 LAB — GAMMA GT: GGT: 144 U/L — ABNORMAL HIGH (ref 7–51)

## 2023-06-03 LAB — HCV INTERPRETATION

## 2023-06-04 LAB — CELIAC PANEL 10
Antigliadin Abs, IgA: 3 U (ref 0–19)
Endomysial IgA: NEGATIVE
Gliadin IgG: 2 U (ref 0–19)
IgA/Immunoglobulin A, Serum: 202 mg/dL (ref 87–352)
Tissue Transglut Ab: 3 U/mL (ref 0–5)
Transglutaminase IgA: <2 U/mL (ref 0–3)

## 2023-06-05 ENCOUNTER — Ambulatory Visit (HOSPITAL_COMMUNITY): Admission: RE | Admit: 2023-06-05 | Payer: Medicaid Other | Source: Ambulatory Visit

## 2023-06-05 LAB — AFP TUMOR MARKER: AFP-Tumor Marker: 3.7 ng/mL

## 2023-06-05 LAB — ALPHA-1-ANTITRYPSIN: A-1 Antitrypsin, Ser: 123 mg/dL (ref 83–199)

## 2023-06-05 LAB — ANA: Anti Nuclear Antibody (ANA): NEGATIVE

## 2023-06-05 LAB — ANTI-MICROSOMAL ANTIBODY LIVER / KIDNEY: LKM1 Ab: <=20 U (ref <=20.0)

## 2023-06-05 LAB — HEPATITIS A ANTIBODY, TOTAL: Hepatitis A AB,Total: REACTIVE — AB

## 2023-06-05 LAB — HEPATITIS B CORE ANTIBODY, TOTAL: Hep B Core Total Ab: NONREACTIVE

## 2023-06-05 LAB — CERULOPLASMIN: Ceruloplasmin: 24 mg/dL (ref 14–48)

## 2023-06-06 ENCOUNTER — Ambulatory Visit (HOSPITAL_COMMUNITY)
Admission: RE | Admit: 2023-06-06 | Discharge: 2023-06-06 | Disposition: A | Discharge status: Home / Self Care | Payer: Medicaid Other | Source: Ambulatory Visit | Attending: Gastroenterology | Admitting: Gastroenterology

## 2023-06-06 DIAGNOSIS — K580 Irritable bowel syndrome with diarrhea: Secondary | ICD-10-CM | POA: Diagnosis not present

## 2023-06-06 DIAGNOSIS — R7989 Other specified abnormal findings of blood chemistry: Secondary | ICD-10-CM | POA: Insufficient documentation

## 2023-06-10 ENCOUNTER — Ambulatory Visit (INDEPENDENT_AMBULATORY_CARE_PROVIDER_SITE_OTHER): Payer: Self-pay | Admitting: Plastic Surgery

## 2023-06-10 ENCOUNTER — Encounter: Payer: Self-pay | Admitting: Plastic Surgery

## 2023-06-10 ENCOUNTER — Telehealth: Payer: Self-pay | Admitting: Gastroenterology

## 2023-06-10 DIAGNOSIS — Z719 Counseling, unspecified: Secondary | ICD-10-CM

## 2023-06-10 DIAGNOSIS — Z800 Family history of malignant neoplasm of digestive organs: Secondary | ICD-10-CM

## 2023-06-10 DIAGNOSIS — R7989 Other specified abnormal findings of blood chemistry: Secondary | ICD-10-CM

## 2023-06-10 DIAGNOSIS — L719 Rosacea, unspecified: Secondary | ICD-10-CM

## 2023-06-10 DIAGNOSIS — K580 Irritable bowel syndrome with diarrhea: Secondary | ICD-10-CM

## 2023-06-10 NOTE — Telephone Encounter (Signed)
PT is requesting to speak further to a nurse about her lab work. She would also like to be notified when time for her to schedule 6 month FU. Please advise.

## 2023-06-10 NOTE — Progress Notes (Signed)
Preoperative Dx: Nissen hyperpigmented and of the face with rosacea  Postoperative Dx:  same  Procedure: laser to face and neck  Anesthesia: none  Description of Procedure:  Risks and complications were explained to the patient. Consent was confirmed and signed. Eye protection was placed. Time out was called and all information was confirmed to be correct. The area  area was prepped with alcohol and wiped dry. The BBL laser was set at 515 and 560 nm at 5.5-6 J/cm2. The face and neck was lasered. The patient tolerated the procedure well and there were no complications. The patient is to follow up in 4 weeks.

## 2023-06-10 NOTE — Telephone Encounter (Signed)
Patient said that she did stop alcohol for a month and then she did restart some alcohol like 1 to 2 glasses of wine maybe 1-2 times a week and was taking tylenol 2 500mg  daily and sometimes will take 2 500mg  2 times a day for her headaches because she was told not to take NSAID's.  Patient was told that she needed to stop all alcohol as of now and not to go over 2000mg  daily of tylenol but to take tylenol as needed not on daily thing but if her headaches persist then she needs to reach out to her PCP regarding this. Her 6 months I was told her to call in January to see if we have March appointments but there will be a letter that is sent as it gets closer to the time. Please advise

## 2023-06-12 ENCOUNTER — Ambulatory Visit: Payer: Medicaid Other | Attending: Physician Assistant | Admitting: Physician Assistant

## 2023-06-12 VITALS — BP 132/85 | HR 69 | Wt 142.2 lb

## 2023-06-12 DIAGNOSIS — J3489 Other specified disorders of nose and nasal sinuses: Secondary | ICD-10-CM

## 2023-06-12 DIAGNOSIS — M62838 Other muscle spasm: Secondary | ICD-10-CM | POA: Diagnosis not present

## 2023-06-12 DIAGNOSIS — I83811 Varicose veins of right lower extremities with pain: Secondary | ICD-10-CM | POA: Diagnosis not present

## 2023-06-12 DIAGNOSIS — I8390 Asymptomatic varicose veins of unspecified lower extremity: Secondary | ICD-10-CM | POA: Diagnosis not present

## 2023-06-12 DIAGNOSIS — G43709 Chronic migraine without aura, not intractable, without status migrainosus: Secondary | ICD-10-CM

## 2023-06-12 MED ORDER — FLUTICASONE PROPIONATE 50 MCG/ACT NA SUSP
2.0000 | Freq: Every day | NASAL | 6 refills | Status: DC
Start: 2023-06-12 — End: 2023-08-04
  Filled 2023-06-12: qty 16, 30d supply, fill #0

## 2023-06-12 NOTE — Patient Instructions (Addendum)
Placed in [facility]  [address] [place], [place] [address] Ph# [phone] Fax [phone]   Eustachian Tube Dysfunction  Eustachian tube dysfunction refers to a condition in which a blockage develops in the narrow passage that connects the middle ear to the back of the nose (eustachian tube). The eustachian tube regulates air pressure in the middle ear by letting air move between the ear and nose. It also helps to drain fluid from the middle ear space. Eustachian tube dysfunction can affect one or both ears. When the eustachian tube does not function properly, air pressure, fluid, or both can build up in the middle ear. What are the causes? This condition occurs when the eustachian tube becomes blocked or cannot open normally. Common causes of this condition include: Ear infections. Colds and other infections that affect the nose, mouth, and throat (upper respiratory tract). Allergies. Irritation from cigarette smoke. Irritation from stomach acid coming up into the esophagus (gastroesophageal reflux). The esophagus is the part of the body that moves food from the mouth to the stomach. Sudden changes in air pressure, such as from descending in an airplane or scuba diving. Abnormal growths in the nose or throat, such as: Growths that line the nose (nasal polyps). Abnormal growth of cells (tumors). Enlarged tissue at the back of the throat (adenoids). What increases the risk? You are more likely to develop this condition if: You smoke. You are overweight. You are a child who has: Certain birth defects of the mouth, such as cleft palate. Large tonsils or adenoids. What are the signs or symptoms? Common symptoms of this condition include: A feeling of fullness in the ear. Ear pain. Clicking or popping noises in the ear. Ringing in the ear (tinnitus). Hearing loss. Loss of balance. Dizziness. Symptoms may get worse when the air pressure around you changes,  such as when you travel to an area of high elevation, fly on an airplane, or go scuba diving. How is this diagnosed? This condition may be diagnosed based on: Your symptoms. A physical exam of your ears, nose, and throat. Tests, such as those that measure: The movement of your eardrum. Your hearing (audiometry). How is this treated? Treatment depends on the cause and severity of your condition. In mild cases, you may relieve your symptoms by moving air into your ears. This is called "popping the ears." In more severe cases, or if you have symptoms of fluid in your ears, treatment may include: Medicines to relieve congestion (decongestants). Medicines that treat allergies (antihistamines). Nasal sprays or ear drops that contain medicines that reduce swelling (steroids). A procedure to drain the fluid in your eardrum. In this procedure, a small tube may be placed in the eardrum to: Drain the fluid. Restore the air in the middle ear space. A procedure to insert a balloon device through the nose to inflate the opening of the eustachian tube (balloon dilation). Follow these instructions at home: Lifestyle Do not do any of the following until your health care provider approves: Travel to high altitudes. Fly in airplanes. Work in a Estate agent or room. Scuba dive. Do not use any products that contain nicotine or tobacco. These products include cigarettes, chewing tobacco, and vaping devices, such as e-cigarettes. If you need help quitting, ask your health care provider. Keep your ears dry. Wear fitted earplugs during showering and bathing. Dry your ears completely after. General instructions Take over-the-counter and prescription medicines only as told by your health care provider. Use techniques  to help pop your ears as recommended by your health care provider. These may include: Chewing gum. Yawning. Frequent, forceful swallowing. Closing your mouth, holding your nose closed, and  gently blowing as if you are trying to blow air out of your nose. Keep all follow-up visits. This is important. Contact a health care provider if: Your symptoms do not go away after treatment. Your symptoms come back after treatment. You are unable to pop your ears. You have: A fever. Pain in your ear. Pain in your head or neck. Fluid draining from your ear. Your hearing suddenly changes. You become very dizzy. You lose your balance. Get help right away if: You have a sudden, severe increase in any of your symptoms. Summary Eustachian tube dysfunction refers to a condition in which a blockage develops in the eustachian tube. It can be caused by ear infections, allergies, inhaled irritants, or abnormal growths in the nose or throat. Symptoms may include ear pain or fullness, hearing loss, or ringing in the ears. Mild cases are treated with techniques to unblock the ears, such as yawning or chewing gum. More severe cases are treated with medicines or procedures. This information is not intended to replace advice given to you by your health care provider. Make sure you discuss any questions you have with your health care provider. Document Revised: 11/20/2020 Document Reviewed: 11/20/2020 Elsevier Patient Education  2024 [employer].

## 2023-06-12 NOTE — Progress Notes (Signed)
Patient ID: Rhonda Graves, female   DOB: December 21, 1980, 42 y.o.   MRN: [id number]   Rhonda Graves, is a 42 y.o. female  [id number]  ION:[id number]  DOB - 02-Nov-1980  Chief Complaint  Patient presents with   Referral   Sinus Problem       Subjective:   Rhonda Graves is a 42 y.o. female here today for multiple issues.  Imitrex helps with migraines but she is requesting Ubrelvy.  She has never seen a neurologist for HA and the imitrex is helping.  The HA have not changed.    She has varicose veins esp on her RLE that have worsened with age and have become painful.  She has been wearing TED stockings with minimal relief.  Her legs ache and cause her a lot of pain and throbbing and the varicosities worsen throughout the day.   She had covid about 2 weeks ago.  She had a ST, hoarseness.  She has been having sinus pain in the maxillary and frontal areas intermittently after the initial symptoms went away.  No discolored mucus or purulent drainage.     No problems updated.  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 Uncertain what caused reaction.   Lomotil [Diphenoxylate] Itching and Swelling    Had allergic reaction after Lomotil, Lysteda and Hemabate. Uncertain what caused reaction.   Lysteda [Tranexamic Acid] Itching and Swelling    Had allergic reaction after Lomotil, Lysteda and Hemabate. Uncertain what caused reaction.    PAST MEDICAL HISTORY: Past Medical History:  Diagnosis Date   Abnormal Pap smear    had colpo in past, normal pap since then   Anxiety    Depressive disorder    Dysmenorrhea    Eczema    GERD (gastroesophageal reflux disease) Dx 2010   Headache(784.0)    migraine   History of oral herpes simplex infection 05/29/2017   +IgM 1/2 antibody  screen done for cold sores vs aphthous ulcers   IBS (irritable bowel syndrome)    Migraine    Multiple allergies    Urticaria     MEDICATIONS AT HOME: Prior to Admission medications   Medication Sig Start Date End Date Taking? Authorizing Provider  dicyclomine (BENTYL) 10 MG capsule Take 1 capsule (10 mg total) by mouth 2 (two) times daily. 03/11/23  Yes Lynann Bologna, MD  fluticasone [facility]) 50 MCG/ACT nasal spray Place 2 sprays into both nostrils daily. 06/12/23  Yes Renika Shiflet, Marzella Schlein, PA-C  cyclobenzaprine (FLEXERIL) 5 MG tablet Take 1 tablet (5 mg total) by mouth 3 (three) times daily as needed for muscle spasms. Patient not taking: Reported on 06/02/2023 11/20/22   Claiborne Rigg, NP  diclofenac Sodium (VOLTAREN) 1 % GEL Apply 2 g topically 4 (four) times daily. Patient not taking: Reported on 06/02/2023 11/22/22   Claiborne Rigg, NP  EPINEPHrine 0.3 mg/0.3 mL IJ SOAJ injection Inject 0.3 mg into the muscle as needed. Patient not taking: Reported on 06/02/2023 04/12/22   Hoy Register, MD  levocetirizine Elita Boone ALLERGY 24HR) 5 MG tablet Take 1 tablet (5 mg total) by mouth every evening. 11/20/22   Claiborne Rigg, NP  Melatonin 10 MG TABS Take 1 tablet by mouth daily. 05/13/23   Claiborne Rigg, NP  SUMAtriptan (IMITREX) 50 MG tablet  TAKE 1 TABLET BY MOUTH AT ONSET OF HEADACHE. MAY REPEAT IN 2 HOURS IF HEADACHE PERSISTS OR RECURS. Patient not taking: Reported on 06/02/2023 11/22/22 11/22/23  Claiborne Rigg, NP    ROS: Neg resp Neg cardiac Neg GI Neg GU Neg MS Neg psych Neg neuro  Objective:   Vitals:   06/12/23 1533 06/12/23 1642  BP: (!) 149/90 132/85  Pulse: 69   SpO2: 99%   Weight: 142 lb 3.2 oz (64.5 kg)    Exam General appearance : Awake, alert, not in any distress. Speech Clear. Not toxic looking HEENT: Atraumatic and Normocephalic BTM congested but without erythema.  Throat with PND but no erythema Neck: Supple, no JVD. No cervical lymphadenopathy.  Chest: Good air  entry bilaterally, CTAB.  No rales/rhonchi/wheezing CVS: S1 S2 regular, no murmurs.  Extremities: B/L Lower Ext shows no edema, both legs are warm to touch.  Large varicosities of RLE over the calf.  No Homan's or erythema/induration of calf.  L with minimal varicosity Neurology: Awake alert, and oriented X 3, CN II-XII intact, Non focal Skin: No Rash  Data Review Lab Results  Component Value Date   HGBA1C 4.7 (L) 06/16/2020    Assessment & Plan   1. Sinus pressure After recent Covid(about 2 weeks ago) - fluticasone (FLONASE) 50 MCG/ACT nasal spray; Place 2 sprays into both nostrils daily.  Dispense: 16 g; Refill: 6 And sudafed or phenylephrine as needed.    2. Muscle spasms of neck Has flexeril Rx - AMB referral to headache clinic  3. Chronic migraine without aura without status migrainosus, not intractable Imitrex helps but she has never been formally diagnosed or treated by neurology and is requesting various other migraine treattents - AMB referral to headache clinic - Ambulatory referral to Neurology  4. Varicose veins of calf - Ambulatory referral to Vascular Surgery  5. Varicose veins of right lower extremity with pain Continue TED stockings and elevating - Ambulatory referral to Vascular Surgery    Return if symptoms worsen or fail to improve.  The patient was given clear instructions to go to ER or return to medical center if symptoms don't improve, worsen or new problems develop. The patient verbalized understanding. The patient was told to call to get lab results if they haven't heard anything in the next week.      Georgian Co, PA-C [facility] [place], [place] [phone]   06/12/2023, 6:26 PM

## 2023-06-13 ENCOUNTER — Other Ambulatory Visit: Payer: Self-pay | Admitting: Plastic Surgery

## 2023-06-13 ENCOUNTER — Encounter: Payer: Self-pay | Admitting: Neurology

## 2023-06-13 ENCOUNTER — Other Ambulatory Visit: Payer: Self-pay

## 2023-06-17 ENCOUNTER — Other Ambulatory Visit: Payer: Self-pay | Admitting: *Deleted

## 2023-06-17 DIAGNOSIS — R600 Localized edema: Secondary | ICD-10-CM

## 2023-06-19 ENCOUNTER — Encounter (HOSPITAL_COMMUNITY): Payer: Medicaid Other

## 2023-06-20 ENCOUNTER — Ambulatory Visit (HOSPITAL_COMMUNITY): Payer: Medicaid Other

## 2023-06-23 ENCOUNTER — Other Ambulatory Visit: Payer: Self-pay

## 2023-06-23 NOTE — Telephone Encounter (Signed)
Agreed Has appt with me on 10/18 Will discuss more RG

## 2023-06-24 ENCOUNTER — Ambulatory Visit (HOSPITAL_COMMUNITY): Payer: Medicaid Other

## 2023-06-24 ENCOUNTER — Encounter: Payer: Medicaid Other | Admitting: Gastroenterology

## 2023-06-24 NOTE — Telephone Encounter (Signed)
LVM for patient to call back. Her appointment has been switched and I was not notified. Did new instructions for her

## 2023-06-25 ENCOUNTER — Ambulatory Visit: Payer: Self-pay | Admitting: Family

## 2023-06-25 NOTE — Telephone Encounter (Signed)
LVM and my chart message

## 2023-06-25 NOTE — Telephone Encounter (Signed)
Inbound call from patient, returning call.

## 2023-06-26 ENCOUNTER — Ambulatory Visit (HOSPITAL_COMMUNITY)
Admission: RE | Admit: 2023-06-26 | Discharge: 2023-06-26 | Disposition: A | Discharge status: Home / Self Care | Payer: Medicaid Other | Source: Ambulatory Visit | Attending: Physician Assistant | Admitting: Physician Assistant

## 2023-06-26 DIAGNOSIS — R600 Localized edema: Secondary | ICD-10-CM | POA: Diagnosis not present

## 2023-06-30 ENCOUNTER — Other Ambulatory Visit: Payer: Self-pay

## 2023-06-30 MED ORDER — INFLUENZA VIRUS VACC SPLIT PF (FLUZONE) 0.5 ML IM SUSY
0.5000 mL | PREFILLED_SYRINGE | Freq: Once | INTRAMUSCULAR | 0 refills | Status: AC
  Filled 2023-06-30: qty 0.5, 1d supply, fill #0

## 2023-07-01 NOTE — Progress Notes (Unsigned)
Patient ID: Rhonda Graves, female   DOB: 1981-09-03, 42 y.o.   MRN: [id number]  Reason for Consult: Varicose Veins (Varicose veins R>L.  R side causes a lot of pain for her that interrupts her daily activities.  Wears knee high compression hose daily.)   Referred by Anders Simmonds, PA-C  Subjective:     HPI  Rhonda Graves is a 42 y.o. female presenting for evaluation of right lower extremity varicose veins.  She reports she has had varicose veins for many years, since age 74, but over time has gotten larger and recently started to get painful.  Her legs ache towards the end of the day, right greater than left and her varicosities itch and throb.  She has been using compression stockings for many years but her symptoms have only gotten worse.  She denies any history of DVT.  Past Medical History:  Diagnosis Date   Abnormal Pap smear    had colpo in past, normal pap since then   Anxiety    Depressive disorder    Dysmenorrhea    Eczema    GERD (gastroesophageal reflux disease) Dx 2010   Headache(784.0)    migraine   History of oral herpes simplex infection 05/29/2017   +IgM 1/2 antibody screen done for cold sores vs aphthous ulcers   IBS (irritable bowel syndrome)    Migraine    Multiple allergies    Urticaria    Family History  Problem Relation Age of Onset   Hypothyroidism Mother    Arthritis Mother    Allergic rhinitis Father    Asthma Brother    Allergic rhinitis Brother    Colon cancer Paternal Grandmother    Breast cancer Neg Hx    Past Surgical History:  Procedure Laterality Date   CESAREAN SECTION N/A 11/26/2013   Procedure: CESAREAN SECTION;  Surgeon: Willodean Rosenthal, MD;  Location: [facility];  Service: Obstetrics;  Laterality: N/A;   CESAREAN SECTION N/A 08/17/2017   Procedure: CESAREAN SECTION;  Surgeon: Tilda Burrow, MD;  Location: [facility];  Service: Obstetrics;  Laterality: N/A;   ESOPHAGOGASTRODUODENOSCOPY  05/23/2011   Mild gastritis.  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 Uncertain what caused reaction.   Lomotil [Diphenoxylate] Itching and Swelling    Had allergic reaction after Lomotil, Lysteda and Hemabate. Uncertain what caused reaction.   Lysteda [Tranexamic Acid] Itching and Swelling    Had allergic reaction after Lomotil, Lysteda and Hemabate. Uncertain what caused reaction.    Current Outpatient Medications  Medication Sig Dispense Refill   cyclobenzaprine (FLEXERIL) 5 MG tablet Take 1 tablet (5 mg total) by mouth 3 (three) times daily as needed for muscle spasms. 60 tablet 3   diclofenac Sodium (VOLTAREN) 1 % GEL Apply 2 g topically 4 (four) times daily. 200 g 1   dicyclomine (BENTYL) 10 MG capsule Take 1 capsule (10 mg total) by mouth 2 (two) times daily. 180 capsule 1   EPINEPHrine 0.3 mg/0.3 mL IJ SOAJ injection  Inject 0.3 mg into the muscle as needed. 2 each 0   fluticasone (FLONASE) 50 MCG/ACT nasal spray Place 2 sprays into both nostrils daily. 16 g 6   levocetirizine (XYZAL ALLERGY 24HR) 5 MG tablet Take 1 tablet (5 mg total) by mouth every evening. 90 tablet 3   Melatonin 10 MG TABS Take 1 tablet by mouth daily. 90 tablet 1   SUMAtriptan (IMITREX) 50 MG tablet TAKE 1 TABLET BY MOUTH AT ONSET OF HEADACHE. MAY REPEAT IN 2 HOURS IF HEADACHE PERSISTS OR RECURS. 12 tablet 6   No current facility-administered medications for this visit.    REVIEW OF SYSTEMS  Negative other than noted in HPI    Objective:  Objective   Vitals:   07/02/23 0957  BP: (!)  132/94  Pulse: 71  Temp: 98.2 F (36.8 C)  TempSrc: Temporal  SpO2: 100%  Weight: 142 lb 11.2 oz (64.7 kg)  Height: 5\' 2"  (1.575 m)   Body mass index is 26.1 kg/m.  Physical Exam General: no acute distress Cardiac: hemodynamically stable, nontachycardic Pulm: normal work of breathing GI: non-tender, no pulsatile mass  Neuro: alert, no focal deficit Extremities: Right lower extremity with ropey engorged varicosities on the medial calf.  No signs of previous wounds.  No cyanosis.  Left lower extremity with ropey varicosities medial calf.  Mild edema bilaterally Vascular: Palpable DP bilaterally   Data: Venous Reflux Times  +--------------+---------+------+-----------+------------+---------+  RIGHT        Reflux NoRefluxReflux TimeDiameter cmsComments                           Yes                                    +--------------+---------+------+-----------+------------+---------+  CFV                    yes   >1 second                        +--------------+---------+------+-----------+------------+---------+  FV mid        no                                               +--------------+---------+------+-----------+------------+---------+  Popliteal              yes   >1 second                        +--------------+---------+------+-----------+------------+---------+  GSV at SFJ              yes    >500 ms      0.68               +--------------+---------+------+-----------+------------+---------+  GSV prox thigh          yes    >500 ms      0.33               +--------------+---------+------+-----------+------------+---------+  GSV mid thigh           yes    >500 ms      0.38               +--------------+---------+------+-----------+------------+---------+  GSV  dist thigh          yes    >500 ms      0.38               +--------------+---------+------+-----------+------------+---------+  GSV at knee              yes    >500 ms      0.39               +--------------+---------+------+-----------+------------+---------+  GSV prox calf           yes    >500 ms     0.0.26              +--------------+---------+------+-----------+------------+---------+  GSV mid calf            yes    >500 ms      0.22               +--------------+---------+------+-----------+------------+---------+  SSV Pop Fossa no                            0.9     too small  +--------------+---------+------+-----------+------------+---------+  SSV prox calf           yes    >500 ms      0.19               +--------------+---------+------+-----------+------------+---------+  SSV mid calf  no                            0.20               +--------------+---------+------+-----------+------------+---------+  AASV O        no                            0.15               +--------------+---------+------+-----------+------------+---------+       Assessment/Plan:     Richelle Musleh is a 42 y.o. female with chronic venous insufficiency with reflux in the right GSV and symptomatic varicosities of the right leg, symptomatic C2. Instructed to continue wearing medical grade graduated compression stockings which are 20 to 30 mmHg.   Encouraged to intermittently elevate throughout the day. Plan to follow-up in 3 months with either Dr. Randie Heinz or Dr. Carolin Coy MD [facility]

## 2023-07-02 ENCOUNTER — Ambulatory Visit: Payer: Medicaid Other | Admitting: Vascular Surgery

## 2023-07-02 VITALS — BP 132/94 | HR 71 | Temp 98.2°F | Ht 62.0 in | Wt 142.7 lb

## 2023-07-02 DIAGNOSIS — I872 Venous insufficiency (chronic) (peripheral): Secondary | ICD-10-CM

## 2023-07-02 NOTE — Patient Instructions (Incomplete)
Food Allergy    - continue avoidance measures for peanut, tree nuts you avoid, as well as peaches/stone fruits.    - have access to self-injectable epinephrine Epipen 0.3mg  at all times    - follow emergency action plan in case of allergic reaction    - skin testing today is positive to peanut and negative to cashew, walnut, pecan and peach with adequate controls. We will get lab work to complement your skin testing. We will call you with results once they are all back    -  discussed if having a reaction that is involving more than just her skin (itching, rash, swelling) then would recommend epinephrine use.  Discussed she can take prednisone 20 mg would be appropriate dose if she does have allergic reaction symptoms to help minimize risk of a biphasic reaction.    Allergic rhinitis Urticaria Pruritus    - continue avoidance measures for grass pollen, tree pollen, weed pollen.     -  environmental allergy testing today is positive to 1 weed pollen and 1 tree pollen with adequate controls. Intradermal skin testing is positive to [place], [place], [place], ragweed mix and tree mix    - continue Xyzal 5mg  daily.  May take additional dose if needed for itching or hives.      - if having nasal congestion can use nasal steroid spray like Nasacort or Rhinocort 2 sprays each nostril daily as needed.  Use for 1-2 weeks at a time before stopping once symptoms.     - if having runny nose can use nasal antihistamine Astelin 2 sprays each nostril twice a day as needed    - if having itchy/watery eyes can use Pataday 1 drop each eye daily as needed. - spoke with Aryiana over the phone and she will get chest x-ray due to pruritus. We will call you with results once they are back.  Drug reaction     - at this time continue avoidance of the medications (Hemabate, Lysteda and Lomotil).  There is no standardized testing for any of these medications.  Graded drug challenge in-office may be an option to rule-out allergy  especially for Lomotil as this does come as a orally administrated medication.   Follow-up in 3 months or sooner if needed.  Reducing Pollen Exposure The American Academy of Allergy, Asthma and Immunology suggests the following steps to reduce your exposure to pollen during allergy seasons. Do not hang sheets or clothing out to dry; pollen may collect on these items. Do not mow lawns or spend time around freshly cut grass; mowing stirs up pollen. Keep windows closed at night.  Keep car windows closed while driving. Minimize morning activities outdoors, a time when pollen counts are usually at their highest. Stay indoors as much as possible when pollen counts or humidity is high and on windy days when pollen tends to remain in the air longer. Use air conditioning when possible.  Many air conditioners have filters that trap the pollen spores. Use a HEPA room air filter to remove pollen form the indoor air you breathe.

## 2023-07-03 ENCOUNTER — Ambulatory Visit: Payer: Medicaid Other | Admitting: Family

## 2023-07-03 ENCOUNTER — Other Ambulatory Visit: Payer: Self-pay

## 2023-07-03 ENCOUNTER — Encounter: Payer: Self-pay | Admitting: Family

## 2023-07-03 VITALS — BP 110/80 | HR 70 | Temp 98.4°F | Resp 16

## 2023-07-03 DIAGNOSIS — J301 Allergic rhinitis due to pollen: Secondary | ICD-10-CM

## 2023-07-03 DIAGNOSIS — L508 Other urticaria: Secondary | ICD-10-CM

## 2023-07-03 DIAGNOSIS — L509 Urticaria, unspecified: Secondary | ICD-10-CM

## 2023-07-03 DIAGNOSIS — L299 Pruritus, unspecified: Secondary | ICD-10-CM | POA: Diagnosis not present

## 2023-07-03 DIAGNOSIS — T7800XD Anaphylactic reaction due to unspecified food, subsequent encounter: Secondary | ICD-10-CM

## 2023-07-03 NOTE — Progress Notes (Signed)
[address] [place] [address] Dept: [phone]  FOLLOW UP NOTE  Patient ID: Rhonda Graves, female    DOB: [birth year]  Age: 42 y.o. MRN: [id number] Date of Office Visit: 07/03/2023  Assessment  Chief Complaint: Allergy Testing (Retest: Took prednisone last night and this morning due to itch)  HPI Rhonda Graves is a 42 year old female who presents today for skin testing to environmental allergens and select foods.  She was last seen on January 16, 2023 by Dr. Delorse Lek for food allergy, allergic rhinitis, urticaria, pruritus, and drug reaction.  She denies any new diagnosis or surgery since her last office visit, but mentions in 2 months she will have an ablation of the veins due to chronic venous insufficiency.  She will start with the right leg and then do the left leg in January.  Food allergy: She continues to avoid peanuts, certain tree nuts, peaches, and some stone fruits.  She reports that she is able to eat Nutella which contains hazelnuts. She also eats pistachios and almonds.  She eats pistachios almost every day.  She also reports that she is now eating sunflower seeds.  She also eats pumpkin seeds.  She avoids walnuts, pecans, cashews, and macadamia.  She is wondering if she is a allergic to plum also.  Discussed how plum is a stone fruit.  She is able to eat some stone fruits such as cherries, mangoes, blackberries, olives, and coconut.  Since her last office visit she has not had any accidental ingestion or use of her epinephrine autoinjector device.  She reports that if she touches a peach she will get hives or even just smelling the peach bothers her eyes.  In 2005 she reports that she had a bad reaction after eating a crepe that contained walnuts and whipped cream.  She reports that she almost died.  Her body felt on fire, puffed up in her throat was closing up.  Allergic rhinitis/urticaria/pruritus.  She reports that in order to come off Xyzal for skin testing she took 40 mg of  prednisone yesterday and 20 mg today.  When she comes off prednisone she reports that she will have itching.  She reports that the first week of September she had COVID-19.  During that time she had sinus pressure and was having to take decongestants.  The sinus pressure is better now.  She does have postnasal drip, rhinorrhea at times, and nasal congestion times.  She currently takes Xyzal 5 mg once a day and she only uses Flonase nasal spray when her sinus pressure/nasal congestion is bad.  She does not like nose sprays.  She does report itchy watery eyes at times for which Pataday helps.  She reports maybe once to twice a year she will get red patches on her skin out of nowhere.  She does not feel like it is related to her food she wonders if it is something in the environment.  The red patches will last for about an hour after taking Benadryl.  It does not leave any residual bruising.  She denies any concomitant cardiorespiratory and gastrointestinal symptoms.  She reports that she has had itching without rash for at least 10 years.  If she is not on Xyzal daily and will start on the palms of her hands.  At times she will also be itchy on her feet, genitals, and face.  She describes it as feeling like something is in her blood.  She thought she potentially had a dust mite  allergy, because if she does not change her bed sheets every 8 to 10 days she will be itchy.  She is getting a mammogram next week and a colonoscopy next week.  She reports that she does have a fatty liver and recently had an abdominal ultrasound that was normal.  Drug reaction: She continues to avoid Hemabate, Lysteda, and Lomotil.     Drug Allergies:  Allergies  Allergen Reactions   Other Anaphylaxis    Walnuts   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 Uncertain what caused  reaction.   Lomotil [Diphenoxylate] Itching and Swelling    Had allergic reaction after Lomotil, Lysteda and Hemabate. Uncertain what caused reaction.   Lysteda [Tranexamic Acid] Itching and Swelling    Had allergic reaction after Lomotil, Lysteda and Hemabate. Uncertain what caused reaction.    Review of Systems: Negative except as per HPI   Physical Exam: BP 110/80   Pulse 70   Temp 98.4 F (36.9 C) (Temporal)   Resp 16   SpO2 98%    Physical Exam Constitutional:      Appearance: Normal appearance.  HENT:     Head: Normocephalic and atraumatic.     Comments: Pharynx normal, eyes normal, ears normal, nose normal    Right Ear: Tympanic membrane, ear canal and external ear normal.     Left Ear: Tympanic membrane, ear canal and external ear normal.     Nose: Nose normal.     Mouth/Throat:     Mouth: Mucous membranes are moist.     Pharynx: Oropharynx is clear.  Eyes:     Conjunctiva/sclera: Conjunctivae normal.  Cardiovascular:     Rate and Rhythm: Regular rhythm.     Heart sounds: Normal heart sounds.  Pulmonary:     Effort: Pulmonary effort is normal.     Breath sounds: Normal breath sounds.     Comments: Lungs clear to auscultation Musculoskeletal:     Cervical back: Neck supple.  Skin:    General: Skin is warm.  Neurological:     Mental Status: She is alert and oriented to person, place, and time.  Psychiatric:        Mood and Affect: Mood normal.        Behavior: Behavior normal.        Thought Content: Thought content normal.        Judgment: Judgment normal.     Diagnostics: Skin prick testing to environmental allergens was positive to 1 weed pollen and 1 tree pollen.  Intradermal skin testing is  Skin prick testing to select foods is positive to peanut (5 x 4) and negative to cashew, walnut, pecan, and peach with adequate controls   Airborne Adult Perc - 07/03/23 1356     Time Antigen Placed 1401    Allergen Manufacturer Waynette Buttery    Location Back     Number of Test 55    1. Control-Buffer 50% Glycerol Negative    2. Control-Histamine 3+    3. Bahia Negative    4. [place] Negative    5. Johnson Negative    6. [place] Blue Negative    7. Meadow Fescue Negative    8. Perennial Rye Negative    9. Timothy Negative    10. Ragweed Mix Negative    11. Cocklebur Negative    12. Plantain,  English Negative  13. Baccharis Negative    14. Dog Fennel Negative    15. Russian Thistle Negative    16. Lamb's Quarters Negative    17. Sheep Sorrell Negative    18. Rough Pigweed Negative    19. Marsh Elder, Rough Negative    20. Mugwort, Common Negative    21. Box, Elder 2+    22. Cedar, red Negative    23. Sweet Gum Negative    24. Pecan Pollen Negative    25. Pine Mix Negative    26. Walnut, Black Pollen Negative    27. Red Mulberry Negative    28. Ash Mix Negative    29. Birch Mix Negative    30. Beech American Negative    31. Cottonwood, [place] Negative    32. Hickory, White Negative    33. Maple Mix Negative    34. Oak, [place] Mix Negative    35. Sycamore Eastern Negative    36. Alternaria Alternata Negative    37. Cladosporium Herbarum Negative    38. Aspergillus Mix Negative    39. Penicillium Mix Negative    40. Bipolaris Sorokiniana (Helminthosporium) Negative    41. Drechslera Spicifera (Curvularia) Negative    42. Mucor Plumbeus Negative    43. Fusarium Moniliforme Negative    44. Aureobasidium Pullulans (pullulara) Negative    45. Rhizopus Oryzae Negative    46. Botrytis Cinera Negative    47. Epicoccum Nigrum Negative    48. Phoma Betae Negative    49. Dust Mite Mix Negative    50. Cat Hair 10,000 BAU/ml Negative    51.  Dog Epithelia Negative    52. Mixed Feathers Negative    53. Horse Epithelia Negative    54. Cockroach, German Negative    55. Tobacco Leaf Negative             Intradermal - 07/03/23 1431     Time Antigen Placed 1436    Allergen Manufacturer Waynette Buttery    Location Arm    Number of Test  15    Control Negative    Bahia 3+    [place] 3+    Johnson 2+    7 Grass Negative    Ragweed Mix 3+    Tree Mix 3+    Mold 1 Negative    Mold 2 Negative    Mold 3 Negative    Mold 4 Negative    Mite Mix Negative    Cat Negative    Dog Negative    Cockroach Negative             Food Adult Perc - 07/03/23 1300     Time Antigen Placed 1402    Allergen Manufacturer Waynette Buttery    Location Back    Number of allergen test 5    1. Peanut --   5x4   10. Cashew Negative    11. Walnut Food Negative    14. Pecan Food Negative    59. Peach Negative                     Assessment and Plan: 1. Seasonal allergic rhinitis due to pollen   2. Allergy with anaphylaxis due to food, subsequent encounter   3. Pruritus   4. Urticaria     No orders of the defined types were placed in this encounter.   Patient Instructions  Food Allergy    - continue avoidance measures for peanut, tree nuts you avoid, as well as peaches/stone  fruits.    - have access to self-injectable epinephrine Epipen 0.3mg  at all times    - follow emergency action plan in case of allergic reaction    - skin testing today is positive to peanut and negative to cashew, walnut, pecan and peach with adequate controls. We will get lab work to complement your skin testing. We will call you with results once they are all back    -  discussed if having a reaction that is involving more than just her skin (itching, rash, swelling) then would recommend epinephrine use.  Discussed she can take prednisone 20 mg would be appropriate dose if she does have allergic reaction symptoms to help minimize risk of a biphasic reaction.    Allergic rhinitis Urticaria Pruritus    - continue avoidance measures for grass pollen, tree pollen, weed pollen.     -  environmental allergy testing today is positive to 1 weed pollen and 1 tree pollen with adequate controls. Intradermal skin testing is positive to [place], [place], [place],  ragweed mix and tree mix    - continue Xyzal 5mg  daily.  May take additional dose if needed for itching or hives.      - if having nasal congestion can use nasal steroid spray like Nasacort or Rhinocort 2 sprays each nostril daily as needed.  Use for 1-2 weeks at a time before stopping once symptoms.     - if having runny nose can use nasal antihistamine Astelin 2 sprays each nostril twice a day as needed    - if having itchy/watery eyes can use Pataday 1 drop each eye daily as needed. - spoke with Katerine over the phone and she will get chest x-ray due to pruritus. We will call you with results once they are back.  Drug reaction     - at this time continue avoidance of the medications (Hemabate, Lysteda and Lomotil).  There is no standardized testing for any of these medications.  Graded drug challenge in-office may be an option to rule-out allergy especially for Lomotil as this does come as a orally administrated medication.   Follow-up in 3 months or sooner if needed.  Reducing Pollen Exposure The American Academy of All[employer], Asthma and Immunology suggests the following steps to reduce your exposure to pollen during allergy seasons. Do not hang sheets or clothing out to dry; pollen may collect on these items. Do not mow lawns or spend time around freshly cut grass; mowing stirs up pollen. Keep windows closed at night.  Keep car windows closed while driving. Minimize morning activities outdoors, a time when pollen counts are usually at their highest. Stay indoors as much as possible when pollen counts or humidity is high and on windy days when pollen tends to remain in the air longer. Use air conditioning when possible.  Many air conditioners have filters that trap the pollen spores. Use a HEPA room air filter to remove pollen form the indoor air you breathe.  Return in about 3 months (around 10/03/2023), or if symptoms worsen or fail to improve.    Thank you for the opportunity to care for  this patient.  Please do not hesitate to contact me with questions.  Nehemiah Settle, FNP [facility]

## 2023-07-04 ENCOUNTER — Ambulatory Visit
Admission: RE | Admit: 2023-07-04 | Discharge: 2023-07-04 | Disposition: A | Discharge status: Home / Self Care | Payer: Medicaid Other | Source: Ambulatory Visit | Attending: Obstetrics and Gynecology | Admitting: Obstetrics and Gynecology

## 2023-07-04 DIAGNOSIS — Z1231 Encounter for screening mammogram for malignant neoplasm of breast: Secondary | ICD-10-CM

## 2023-07-07 LAB — PEANUT COMPONENTS
F352-IgE Ara h 8: <0.1 kU/L
F422-IgE Ara h 1: <0.1 kU/L
F423-IgE Ara h 2: <0.1 kU/L
F424-IgE Ara h 3: <0.1 kU/L
F427-IgE Ara h 9: 1.49 kU/L — AB
F447-IgE Ara h 6: <0.1 kU/L

## 2023-07-07 LAB — PANEL 604721
Jug R 1 IgE: <0.1 kU/L
Jug R 3 IgE: 1.48 kU/L — AB

## 2023-07-07 LAB — IGE NUT PROF. W/COMPONENT RFLX
F017-IgE Hazelnut (Filbert): 1 kU/L — AB
F202-IgE Cashew Nut: 0.59 kU/L — AB
F256-IgE Walnut: 1.29 kU/L — AB
Macadamia Nut, IgE: 0.23 kU/L — AB
Peanut, IgE: 1.2 kU/L — AB
Pecan Nut IgE: 0.44 kU/L — AB

## 2023-07-07 LAB — PANEL 604726
Cor A 1 IgE: <0.1 kU/L
Cor A 14 IgE: <0.1 kU/L
Cor A 8 IgE: 1.01 kU/L — AB
Cor A 9 IgE: <0.1 kU/L

## 2023-07-07 LAB — ALLERGEN COMPONENT COMMENTS

## 2023-07-07 LAB — ALLERGEN PEACH F95: Allergen, Peach f95: 7.01 kU/L — AB

## 2023-07-07 LAB — F255-IGE PLUM: F255-IgE Plum: 1.71 kU/L — AB

## 2023-07-09 ENCOUNTER — Ambulatory Visit (HOSPITAL_COMMUNITY)
Admission: RE | Admit: 2023-07-09 | Discharge: 2023-07-09 | Disposition: A | Discharge status: Home / Self Care | Payer: Medicaid Other | Source: Ambulatory Visit | Attending: Family | Admitting: Family

## 2023-07-09 DIAGNOSIS — L299 Pruritus, unspecified: Secondary | ICD-10-CM | POA: Diagnosis not present

## 2023-07-11 ENCOUNTER — Ambulatory Visit: Payer: Medicaid Other | Admitting: Gastroenterology

## 2023-07-11 ENCOUNTER — Encounter: Payer: Self-pay | Admitting: Gastroenterology

## 2023-07-11 VITALS — BP 119/79 | HR 67 | Temp 98.2°F | Resp 17 | Ht 62.0 in | Wt 142.0 lb

## 2023-07-11 DIAGNOSIS — Z800 Family history of malignant neoplasm of digestive organs: Secondary | ICD-10-CM | POA: Diagnosis not present

## 2023-07-11 DIAGNOSIS — F419 Anxiety disorder, unspecified: Secondary | ICD-10-CM | POA: Diagnosis not present

## 2023-07-11 DIAGNOSIS — F32A Depression, unspecified: Secondary | ICD-10-CM | POA: Diagnosis not present

## 2023-07-11 DIAGNOSIS — K580 Irritable bowel syndrome with diarrhea: Secondary | ICD-10-CM | POA: Diagnosis not present

## 2023-07-11 DIAGNOSIS — D120 Benign neoplasm of cecum: Secondary | ICD-10-CM

## 2023-07-11 HISTORY — PX: COLONOSCOPY: SHX174

## 2023-07-11 MED ORDER — SODIUM CHLORIDE 0.9 % IV SOLN: 500.0000 mL | Freq: Once | INTRAVENOUS | Status: DC

## 2023-07-11 NOTE — Progress Notes (Signed)
Pt's states no medical or surgical changes since previsit or office visit. 

## 2023-07-11 NOTE — Progress Notes (Signed)
Called to room to assist during endoscopic procedure.  Patient ID and intended procedure confirmed with present staff. Received instructions for my participation in the procedure from the performing physician.  

## 2023-07-11 NOTE — Op Note (Signed)
[facility] Patient Name: Rhonda Graves Procedure Date: 07/11/2023 11:08 AM MRN: [id number] Endoscopist: Lynann Bologna , MD, [id number] Age: 42 Referring MD:  Date of Birth: 18-Apr-1981 Gender: Female Account #: [id number] Procedure:                Colonoscopy Indications:              Intermittent diarrhea of unexplained origin Medicines:                Monitored Anesthesia Care Procedure:                Pre-Anesthesia Assessment:                           - Prior to the procedure, a History and Physical                            was performed, and patient medications and                            allergies were reviewed. The patient's tolerance of                            previous anesthesia was also reviewed. The risks                            and benefits of the procedure and the sedation                            options and risks were discussed with the patient.                            All questions were answered, and informed consent                            was obtained. Prior Anticoagulants: The patient has                            taken no anticoagulant or antiplatelet agents. ASA                            Grade Assessment: II - A patient with mild systemic                            disease. After reviewing the risks and benefits,                            the patient was deemed in satisfactory condition to                            undergo the procedure.                           After obtaining informed consent, the colonoscope  was passed under direct vision. Throughout the                            procedure, the patient's blood pressure, pulse, and                            oxygen saturations were monitored continuously. The                            Olympus Scope [id number] was introduced through the                            anus and advanced to the 2 cm into the ileum. The                            colonoscopy  was performed without difficulty. The                            patient tolerated the procedure well. The quality                            of the bowel preparation was good. The terminal                            ileum, ileocecal valve, appendiceal orifice, and                            rectum were photographed. Scope In: 11:19:22 AM Scope Out: 11:38:23 AM Scope Withdrawal Time: 0 hours 14 minutes 18 seconds  Total Procedure Duration: 0 hours 19 minutes 1 second  Findings:                 A 2 mm polyp was found in the cecum. The polyp was                            sessile. The polyp was removed with a cold biopsy                            forceps. Resection and retrieval were complete.                           The colon (entire examined portion) appeared                            normal. Biopsies for histology were taken with a                            cold forceps from the entire colon for evaluation                            of microscopic colitis.                           Non-bleeding internal hemorrhoids were  found during                            retroflexion. The hemorrhoids were small and Grade                            I (internal hemorrhoids that do not prolapse).                           The terminal ileum appeared normal.                           The exam was otherwise without abnormality on                            direct and retroflexion views. Complications:            No immediate complications. Estimated Blood Loss:     Estimated blood loss: none. Impression:               - One 2 mm polyp in the cecum, removed with a cold                            biopsy forceps. Resected and retrieved.                           - The entire examined colon is normal. Biopsied.                           - Non-bleeding internal hemorrhoids.                           - The examined portion of the ileum was normal.                           - The examination was otherwise  normal on direct                            and retroflexion views. Recommendation:           - Patient has a contact number available for                            emergencies. The signs and symptoms of potential                            delayed complications were discussed with the                            patient. Return to normal activities tomorrow.                            Written discharge instructions were provided to the                            patient.                           -  Resume previous diet.                           - Continue present medications.                           - Await pathology results.                           - Please use Preparation H 1 twice daily after the                            bowel movement for 7 days and then as needed.                           - Repeat colonoscopy for surveillance based on                            pathology results.                           - The findings and recommendations were discussed                            with the patient's family. Lynann Bologna, MD 07/11/2023 11:42:34 AM This report has been signed electronically.

## 2023-07-11 NOTE — Progress Notes (Signed)
Sedate, gd SR, tolerated procedure well, VSS, report to RN 

## 2023-07-11 NOTE — Patient Instructions (Addendum)
1 polyp removed and hemorrhoids.  Handouts given to patient.                              - Resume previous diet.                           - Continue present medications.                           - Await pathology results.                           - Please use Preparation H 1 twice daily after the                            bowel movement for 7 days and then as needed.                           - Repeat colonoscopy for surveillance based on                            pathology results.                           - The findings and recommendations were discussed                            with the patient's family.  YOU HAD AN ENDOSCOPIC PROCEDURE TODAY AT THE [facility]:   Refer to the procedure report that was given to you for any specific questions about what was found during the examination.  If the procedure report does not answer your questions, please call your gastroenterologist to clarify.  If you requested that your care partner not be given the details of your procedure findings, then the procedure report has been included in a sealed envelope for you to review at your convenience later.  YOU SHOULD EXPECT: Some feelings of bloating in the abdomen. Passage of more gas than usual.  Walking can help get rid of the air that was put into your GI tract during the procedure and reduce the bloating. If you had a lower endoscopy (such as a colonoscopy or flexible sigmoidoscopy) you may notice spotting of blood in your stool or on the toilet paper. If you underwent a bowel prep for your procedure, you may not have a normal bowel movement for a few days.  Please Note:  You might notice some irritation and congestion in your nose or some drainage.  This is from the oxygen used during your procedure.  There is no need for concern and it should clear up in a day or so.  SYMPTOMS TO REPORT IMMEDIATELY:  Following lower endoscopy (colonoscopy or flexible sigmoidoscopy):  Excessive  amounts of blood in the stool  Significant tenderness or worsening of abdominal pains  Swelling of the abdomen that is new, acute  Fever of 100F or higher   For urgent or emergent issues, a gastroenterologist can be reached at any hour by calling [phone]. Do not use MyChart messaging for urgent concerns.    DIET:  We do recommend  a small meal at first, but then you may proceed to your regular diet.  Drink plenty of fluids but you should avoid alcoholic beverages for 24 hours.  ACTIVITY:  You should plan to take it easy for the rest of today and you should NOT DRIVE or use heavy machinery until tomorrow (because of the sedation medicines used during the test).    FOLLOW UP: Our staff will call the number listed on your records the next business day following your procedure.  We will call around 7:15- 8:00 am to check on you and address any questions or concerns that you may have regarding the information given to you following your procedure. If we do not reach you, we will leave a message.     If any biopsies were taken you will be contacted by phone or by letter within the next 1-3 weeks.  Please call us at [phone] if you have not heard about the biopsies in 3 weeks.    SIGNATURES/CONFIDENTIALITY: You and/or your care partner have signed paperwork which will be entered into your electronic medical record.  These signatures attest to the fact that that the information above on your After Visit Summary has been reviewed and is understood.  Full responsibility of the confidentiality of this discharge information lies with you and/or your care-partner.

## 2023-07-11 NOTE — Progress Notes (Signed)
Chief Complaint: To get established  Referring Provider:  Claiborne Rigg, NP      ASSESSMENT AND PLAN;   #1. IBS-D w/t bloating.  Exacerbated by Mg  #2. Abn AST/ALT- likely d/t fatty liver (wt gain/ETOH)  #3. FH CRC (GM at age 42)  Plan:  -Continue dicyclomine 10mg  BID #90 -For Colon with miralax today  Notes reviewed  HPI:    Rhonda Graves is a 42 y.o. female  With H/O migraines, anxiety, urticaria, neg celiac screen, neg SB Bx for celiac, significant allergies Has new insurance  Stopped ETOH completely May 01, 2023 Most recent LFTs as below showed mildly elevated AST/ALT. She is willing to get further evaluation  Also willing to get colonoscopy  C/O longstanding H/O diarrhea (>15 yrs) Recently getting worse after taking magnesium supplements.  She gets better if she stops magnesium but then starts having muscle cramps. Rare constipation (she used to get more constipation previously) Has been taking magnesium supplements at night and fish oil.  Also complains of abdominal bloating and significant gas-at times foul-smelling. Has generalized abdominal discomfort when bloated.  No nausea vomiting heartburn regurgitation odynophagia or dysphagia.  No fever chills or night sweats.  No jaundice dark urine or pale stools.  No family history of liver problems  Has lost 1 pound since last visit.   From previous notes: She has been drinking 1 glass of wine per day-used to be more in pandemic. Drinking 1 soda per day Chewing gums And using ibuprofen as needed  No fever chills or night sweats.      Latest Ref Rng & Units 05/29/2023    7:56 AM 11/22/2022    8:55 AM 06/12/2021    8:32 AM  Hepatic Function  Total Protein 6.0 - 8.3 g/dL 6.9  6.8  6.8   Albumin 3.5 - 5.2 g/dL 4.1  4.5  4.4   AST 0 - 37 U/L 39  30  28   ALT 0 - 35 U/L 58  42  28   Alk Phosphatase 39 - 117 U/L 67  87  80   Total Bilirubin 0.2 - 1.2 mg/dL 0.5  0.8  0.3       FH (GM at 50s with  CRC)-mom or dad did not have any polyps.  Mom with IBS.  Brother with IBS.    Wt Readings from Last 3 Encounters:  07/11/23 142 lb (64.4 kg)  07/02/23 142 lb 11.2 oz (64.7 kg)  06/12/23 142 lb 3.2 oz (64.5 kg)   Past GI workup: EGD 05/23/2011 -Mild gastritis -Neg CLO -Neg SB Bx for celiac.  [place] 2012: neg HIDA with EF 2012: neg  Also had negative H. pylori breath test Neg celiac screen serology.    SH- married.  Has 2 daughters   Wt Readings from Last 3 Encounters:  07/11/23 142 lb (64.4 kg)  07/02/23 142 lb 11.2 oz (64.7 kg)  06/12/23 142 lb 3.2 oz (64.5 kg)     Past Medical History:  Diagnosis Date   Abnormal Pap smear    had colpo in past, normal pap since then   Anxiety    Depressive disorder    Dysmenorrhea    Eczema    GERD (gastroesophageal reflux disease) Dx 2010   Headache(784.0)    migraine   History of oral herpes simplex infection 05/29/2017   +IgM 1/2 antibody screen done for cold sores vs aphthous ulcers   IBS (irritable bowel syndrome)    Migraine  Multiple allergies    Urticaria     Past Surgical History:  Procedure Laterality Date   CESAREAN SECTION N/A 11/26/2013   Procedure: CESAREAN SECTION;  Surgeon: Willodean Rosenthal, MD;  Location: [facility];  Service: Obstetrics;  Laterality: N/A;   CESAREAN SECTION N/A 08/17/2017   Procedure: CESAREAN SECTION;  Surgeon: Tilda Burrow, MD;  Location: [facility]IR[facility] SUIT[facility];  Service: Obstetrics;  Laterality: N/A;   ESOPHAGOGASTRODUODENOSCOPY  05/23/2011   Mild gastritis. Otherwise normal EGD    Family History  Problem Relation Age of Onset   Hypothyroidism Mother    Arthritis Mother    Allergic rhinitis Father    Asthma Brother    Allergic rhinitis Brother    Colon cancer Paternal Grandmother    Breast cancer Neg Hx     Social History   Tobacco Use   Smoking status: Never    Passive exposure: Never   Smokeless tobacco: Never  Vaping Use   Vaping status: Never Used  Substance Use  Topics   Alcohol use: Yes    Comment: occasionally    Drug use: No    Current Outpatient Medications  Medication Sig Dispense Refill   Acetaminophen (TYLENOL PO) Take 500 mg by mouth as needed.     cyclobenzaprine (FLEXERIL) 5 MG tablet Take 1 tablet (5 mg total) by mouth 3 (three) times daily as needed for muscle spasms. 60 tablet 3   dicyclomine (BENTYL) 10 MG capsule Take 1 capsule (10 mg total) by mouth 2 (two) times daily. 180 capsule 1   fluticasone (FLONASE) 50 MCG/ACT nasal spray Place 2 sprays into both nostrils daily. 16 g 6   levocetirizine (XYZAL ALLERGY 24HR) 5 MG tablet Take 1 tablet (5 mg total) by mouth every evening. 90 tablet 3   Melatonin 10 MG TABS Take 1 tablet by mouth daily. 90 tablet 1   diclofenac Sodium (VOLTAREN) 1 % GEL Apply 2 g topically 4 (four) times daily. 200 g 1   EPINEPHrine 0.3 mg/0.3 mL IJ SOAJ injection Inject 0.3 mg into the muscle as needed. 2 each 0   SUMAtriptan (IMITREX) 50 MG tablet TAKE 1 TABLET BY MOUTH AT ONSET OF HEADACHE. MAY REPEAT IN 2 HOURS IF HEADACHE PERSISTS OR RECURS. 12 tablet 6   Current Facility-Administered Medications  Medication Dose Route Frequency Provider Last Rate Last Admin   0.9 %  sodium chloride infusion  500 mL Intravenous Once Lynann Bologna, MD        Allergies  Allergen Reactions   Other Anaphylaxis    Walnuts   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 Uncertain what caused reaction.   Lomotil [Diphenoxylate] Itching and Swelling    Had allergic reaction after Lomotil, Lysteda and Hemabate. Uncertain what caused reaction.   Lysteda [Tranexamic Acid] Itching and Swelling    Had allergic reaction after Lomotil, Lysteda and Hemabate. Uncertain what caused reaction.    Review of Systems:  Constitutional: Denies fever, chills, diaphoresis, appetite change and  fatigue.  HEENT: Has multiple allergies. Respiratory: Denies SOB, DOE, cough, chest tightness,  and wheezing.   Cardiovascular: Denies chest pain, palpitations and leg swelling.  Genitourinary: Denies dysuria, urgency, frequency, hematuria, flank pain and difficulty urinating.  Musculoskeletal: Denies myalgias, back pain, joint swelling, arthralgias and gait problem.  Skin: No rash.  Neurological: Denies dizziness, seizures, syncope, weakness, light-headedness, numbness and has headaches.  Hematological: Denies adenopathy. Easy bruising, personal or family bleeding history  Psychiatric/Behavioral: has anxiety or depression     Physical Exam:    BP 136/84   Pulse 67   Temp 98.2 F (36.8 C) (Temporal)   Ht 5\' 2"  (1.575 m)   Wt 142 lb (64.4 kg)   LMP 06/28/2023 (Exact Date)   SpO2 100%   BMI 25.97 kg/m  Wt Readings from Last 3 Encounters:  07/11/23 142 lb (64.4 kg)  07/02/23 142 lb 11.2 oz (64.7 kg)  06/12/23 142 lb 3.2 oz (64.5 kg)   Constitutional:  Well-developed, in no acute distress. Psychiatric: Normal mood and affect. Behavior is normal. HEENT: Pupils normal.  Conjunctivae are normal. No scleral icterus. Cardiovascular: Normal rate, regular rhythm. No edema Pulmonary/chest: Effort normal and breath sounds normal. No wheezing, rales or rhonchi. Abdominal: Soft, nondistended. Nontender. Bowel sounds active throughout. There are no masses palpable. No hepatomegaly. Rectal: Deferred Neurological: Alert and oriented to person place and time. Skin: Skin is warm and dry. No rashes noted.  Data Reviewed: I have personally reviewed following labs and imaging studies  CBC:    Latest Ref Rng & Units 05/29/2023    7:56 AM 11/22/2022    8:55 AM 06/12/2021    8:32 AM  CBC  WBC 4.0 - 10.5 K/uL 5.3  4.8  5.1   Hemoglobin 12.0 - 15.0 g/dL 86.5  78.4  69.6   Hematocrit 36.0 - 46.0 % 37.7  37.2  39.0   Platelets 150.0 - 400.0 K/uL 228.0  256  233     CMP:    Latest Ref Rng &  Units 05/29/2023    7:56 AM 11/22/2022    8:55 AM 06/12/2021    8:32 AM  CMP  Glucose 70 - 99 mg/dL 84  91  85   BUN 6 - 23 mg/dL 12  12  10    Creatinine 0.40 - 1.20 mg/dL 2.95  2.84  1.32   Sodium 135 - 145 mEq/L 137  139  139   Potassium 3.5 - 5.1 mEq/L 4.2  4.0  4.4   Chloride 96 - 112 mEq/L 105  104  102   CO2 19 - 32 mEq/L 26  21  23    Calcium 8.4 - 10.5 mg/dL 9.3  9.4  9.6   Total Protein 6.0 - 8.3 g/dL 6.9  6.8  6.8   Total Bilirubin 0.2 - 1.2 mg/dL 0.5  0.8  0.3   Alkaline Phos 39 - 117 U/L 67  87  80   AST 0 - 37 U/L 39  30  28   ALT 0 - 35 U/L 58  42  28         Edman Circle, MD 07/11/2023, 11:09 AM  Cc: Claiborne Rigg, NP

## 2023-07-11 NOTE — Progress Notes (Signed)
Please let Varsha know that I reviewed her lab results with Dr. Delorse Lek.   Walnut: Is low enough that we could offer an in office oral food challenge WITH DR. PADGETT to walnuts if she is interested.  The component associated with anaphylaxis was negative.  Also, her skin testing to walnut was negative on July 03, 2023.  She would need to bring a bag of plain walnuts with her the day of the challenge and be off all antihistamines 3 days prior to this appointment.  She would need to be in good health the day of the appointment (no recent antibiotics or vaccines in the past 7 days.).  This appointment will last approximately 2 to 3 hours.  If she is not interested in doing an in office oral food challenge to walnut I recommend continuing to avoid walnut and have access to her epinephrine autoinjector device.  Hazelnut was elevated, but she eats this in [place] without any problems so continue to eat Nutella.  Almond was elevated, but she eats these without any problems.  Continue to eat almonds.  Pistachio was negative.  Continue to eat these.  She does not have problems with pistachios.  Cashews were negative and her skin testing to cashew on July 03, 2023 was negative also.  Cashews and pistachios are across reactive.  She tolerates pistachios without any problems.  If she is interested she could slowly try to introduce cashews at home per Dr. Delorse Lek.  If she is not interested in trying to introduce cashew slowly at home she could schedule an in office oral food challenge to cashews.  [place] nut was negative.  Macadamia was equivocal or low too. If interested she could schedule an in office oral food challenge to macadamia nuts.  If not interested in an in office oral food challenge please continue to avoid macadamia nuts.  Peanut was elevated and one of the components associated with anaphylaxis was also elevated.  Continue to avoid peanut and peanut products and have access to her  epinephrine autoinjector device at all times.  Henreitta Cea is low and her skin testing to pecan was negative.  We can consider an in office oral food challenge to pecan in the future.  Continue to avoid for now until she completes the walnut challenge.  Plum: Was elevated.  Continue to avoid plum along with other stone fruits.  Peach: Was also elevated.  Continue to avoid peach along with other stone fruits.

## 2023-07-14 ENCOUNTER — Telehealth: Payer: Self-pay | Admitting: Family

## 2023-07-14 ENCOUNTER — Telehealth: Payer: Self-pay | Admitting: *Deleted

## 2023-07-14 NOTE — Telephone Encounter (Signed)
Patient calling in for lab results

## 2023-07-14 NOTE — Telephone Encounter (Signed)
  Follow up Call-     07/11/2023   10:45 AM  Call back number  Post procedure Call Back phone  # [phone]  Permission to leave phone message Yes     Patient questions:  Do you have a fever, pain , or abdominal swelling? No. Pain Score  0 *  Have you tolerated food without any problems? Yes.    Have you been able to return to your normal activities? Yes.    Do you have any questions about your discharge instructions: Diet   No. Medications  No. Follow up visit  No.  Do you have questions or concerns about your Care? no  Actions: * If pain score is 4 or above: No action needed, pain <4.

## 2023-07-15 ENCOUNTER — Encounter: Payer: Self-pay | Admitting: Family

## 2023-07-15 ENCOUNTER — Other Ambulatory Visit: Payer: Self-pay | Admitting: Plastic Surgery

## 2023-07-15 LAB — SURGICAL PATHOLOGY

## 2023-07-15 NOTE — Telephone Encounter (Signed)
Per Rhonda Graves "I called patient and informed of results. Informed of challenge instructions. I verified mailing address for protocol and a copy of results has been placed to be mailed out. I also scheduled patient a walnut challenge first."

## 2023-07-22 ENCOUNTER — Ambulatory Visit (INDEPENDENT_AMBULATORY_CARE_PROVIDER_SITE_OTHER): Payer: Medicaid Other | Admitting: Plastic Surgery

## 2023-07-22 DIAGNOSIS — Z719 Counseling, unspecified: Secondary | ICD-10-CM

## 2023-07-22 NOTE — Progress Notes (Signed)
Preoperative Dx: hyperpigmentation and redness of face  Postoperative Dx:  same  Procedure: laser to face   Anesthesia: none  Description of Procedure:  Risks and complications were explained to the patient. Consent was confirmed and signed. Eye protection was placed. Time out was called and all information was confirmed to be correct. The area  area was prepped with alcohol and wiped dry. The Heroic laser was set at 532 nm and preset J/cm2. The dark spots were then lasered with the small 7 mm handpiece and then the red with the square. The face was lasered. The patient tolerated the procedure well and there were no complications. The patient is to follow up in 4 weeks.

## 2023-07-25 ENCOUNTER — Encounter: Payer: Self-pay | Admitting: Gastroenterology

## 2023-07-25 ENCOUNTER — Other Ambulatory Visit: Payer: Self-pay

## 2023-07-25 DIAGNOSIS — I872 Venous insufficiency (chronic) (peripheral): Secondary | ICD-10-CM

## 2023-07-29 ENCOUNTER — Encounter: Payer: Self-pay | Admitting: Gastroenterology

## 2023-07-31 ENCOUNTER — Other Ambulatory Visit: Payer: Self-pay

## 2023-07-31 DIAGNOSIS — K580 Irritable bowel syndrome with diarrhea: Secondary | ICD-10-CM

## 2023-07-31 NOTE — Telephone Encounter (Signed)
Check GGT again along with HBsAb in 12 weeks Then FU with me. RG

## 2023-07-31 NOTE — Progress Notes (Signed)
Please let Rhonda Graves know that her chest x-ray is norma. This is good news!

## 2023-08-01 NOTE — Progress Notes (Unsigned)
NEUROLOGY CONSULTATION NOTE  Rhonda Graves MRN: [id number] DOB: [birth year]  Referring provider: Georgian Co, PA-C Primary care provider: Bertram Denver, NP  Reason for consult:  migraines  Assessment/Plan:   Migraine without aura, without status migrainosus, not intractable Chronic tension-type headache, not intractable  Migraine prevention:  Plan to start Aimovig 140mg  every 28 days Migraine rescue:  She will try samples of Ubrelvy 100mg .  Stop Tylenol and sumatriptan. Limit use of pain relievers to no more than 2 days out of week to prevent risk of rebound or medication-overuse headache. Keep headache diary Follow up 5 months.    Subjective:  Rhonda Graves is a 43 year old left-handed female with IBS, depression and anxiety who presents for migraines.  History supplemented by referring provider's note.  She is accompanied by her husband.  Onset:  teenager Location:  varies - behind eyes, temple, unilateral either side Quality:  sharp, throbbing Intensity:  Severe.  She denies new headache, thunderclap headache or severe headache that wakes her from sleep. Aura:  absent Prodrome:  absent Associated symptoms:  allodynia of scalp, photophobia, phonophobia  She denies nausea, vomiting, visual disturbance, associated unilateral numbness or weakness. Duration:  several hours to a day (untreated), 2-3 hours (treated with sumatriptan) Frequency:  on average once a week (may have a week without migraine and then one two consecutive days in a row) Triggers:  menses (severe migraine 2 days before her period), wine, sleep deprivation, dehydration Relieving factors:  rest Activity:  cannot function  Also has tension-type headaches almost daily.  Usually back of head or sometimes across forehead.  Mild-moderate intensity and occurs at end of the day.  Takes Tylenol almost daily.  Past NSAIDS/analgesics:  diclofenac, ibuprofen, Toradol Past abortive triptans:  eletriptan Past abortive  ergotamine:  none Past muscle relaxants:  none Past anti-emetic:  none Past antihypertensive medications:  amlodipine, hydrochlorothiazide, nifedepine Past antidepressant medications:  sertraline Past anticonvulsant medications:  topiramate Past anti-CGRP:  none Past vitamins/Herbal/Supplements:  none Past antihistamines/decongestants:  Zyrtec, Benadryl, Xyzal, Flonase Other past therapies:  none  Current NSAIDS/analgesics:  Tylenol Current triptans:  sumatriptan 50mg  (causes throat pressure) Current ergotamine:  none Current anti-emetic:  none Current muscle relaxants:  Flexeril 5mg  TID PRN Current Antihypertensive medications:  none Current Antidepressant medications:  none Current Anticonvulsant medications:  none Current anti-CGRP:  none Current Vitamins/Herbal/Supplements:  melatonin Current Antihistamines/Decongestants: none Other therapy:  none Birth control:  none   Caffeine:  1 cup coffee in morning (sometimes 1/2 cup in afternoon) Diet:  1 liter water daily.  Does not skip meals.  Chocolate and cheese are not triggers Exercise:  running on treadmill, rowing twice a week Depression:  no; Anxiety:  yes.   Sleep hygiene:  Hard to shut off brain at night.  Worries.  Difficult to fall asleep Family history of headache:  mom (migraines)      PAST MEDICAL HISTORY: Past Medical History:  Diagnosis Date   Abnormal Pap smear    had colpo in past, normal pap since then   Anxiety    Depressive disorder    Dysmenorrhea    Eczema    GERD (gastroesophageal reflux disease) Dx 2010   Headache(784.0)    migraine   History of oral herpes simplex infection 05/29/2017   +IgM 1/2 antibody screen done for cold sores vs aphthous ulcers   IBS (irritable bowel syndrome)    Migraine    Multiple allergies    Urticaria     PAST SURGICAL HISTORY: Past  Surgical History:  Procedure Laterality Date   CESAREAN SECTION N/A 11/26/2013   Procedure: CESAREAN SECTION;  Surgeon: Willodean Rosenthal, MD;  Location: [facility];  Service: Obstetrics;  Laterality: N/A;   CESAREAN SECTION N/A 08/17/2017   Procedure: CESAREAN SECTION;  Surgeon: Tilda Burrow, MD;  Location: [facility]IRTH[facility] SUIT[facility];  Service: Obstetrics;  Laterality: N/A;   ESOPHAGOGASTRODUODENOSCOPY  05/23/2011   Mild gastritis. Otherwise normal EGD    MEDICATIONS: Current Outpatient Medications on File Prior to Visit  Medication Sig Dispense Refill   Acetaminophen (TYLENOL PO) Take 500 mg by mouth as needed.     cyclobenzaprine (FLEXERIL) 5 MG tablet Take 1 tablet (5 mg total) by mouth 3 (three) times daily as needed for muscle spasms. 60 tablet 3   diclofenac Sodium (VOLTAREN) 1 % GEL Apply 2 g topically 4 (four) times daily. 200 g 1   dicyclomine (BENTYL) 10 MG capsule Take 1 capsule (10 mg total) by mouth 2 (two) times daily. 180 capsule 1   EPINEPHrine 0.3 mg/0.3 mL IJ SOAJ injection Inject 0.3 mg into the muscle as needed. 2 each 0   fluticasone (FLONASE) 50 MCG/ACT nasal spray Place 2 sprays into both nostrils daily. 16 g 6   levocetirizine (XYZAL ALLERGY 24HR) 5 MG tablet Take 1 tablet (5 mg total) by mouth every evening. 90 tablet 3   Melatonin 10 MG TABS Take 1 tablet by mouth daily. 90 tablet 1   SUMAtriptan (IMITREX) 50 MG tablet TAKE 1 TABLET BY MOUTH AT ONSET OF HEADACHE. MAY REPEAT IN 2 HOURS IF HEADACHE PERSISTS OR RECURS. 12 tablet 6   No current facility-administered medications on file prior to visit.    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 Uncertain what caused reaction.   Lomotil [Diphenoxylate] Itching and Swelling    Had allergic reaction after Lomotil, Lysteda and Hemabate. Uncertain what caused reaction.   Lysteda [Tranexamic Acid] Itching and Swelling     Had allergic reaction after Lomotil, Lysteda and Hemabate. Uncertain what caused reaction.    FAMILY HISTORY: Family History  Problem Relation Age of Onset   Hypothyroidism Mother    Arthritis Mother    Allergic rhinitis Father    Asthma Brother    Allergic rhinitis Brother    Colon cancer Paternal Grandmother    Breast cancer Neg Hx     Objective:  Blood pressure (!) 142/92, pulse 70, height 5' (1.524 m), weight 144 lb 3.2 oz (65.4 kg), last menstrual period 06/28/2023, SpO2 96%. General: No acute distress.  Patient appears well-groomed.   Head:  Normocephalic/atraumatic Eyes:  fundi examined but not visualized Neck: supple, no paraspinal tenderness, full range of motion Heart: regular rate and rhythm Neurological Exam: Mental status: alert and oriented to person, place, and time, speech fluent and not dysarthric, language intact. Cranial nerves: CN I: not tested CN II: pupils equal, round and reactive to light, visual fields intact CN III, IV, VI:  full range of motion, no nystagmus, no ptosis CN V: facial sensation intact. CN VII: upper and lower face symmetric CN VIII: hearing intact CN IX, X: gag intact, uvula midline CN XI: sternocleidomastoid and trapezius muscles intact CN XII: tongue midline Bulk & Tone: normal, no fasciculations. Motor:  muscle strength 5/5 throughout Sensation:  Pinprick and  vibratory sensation intact. Deep Tendon Reflexes:  2+ throughout,  toes downgoing.   Finger to nose testing:  Without dysmetria.   Gait:  Normal station and stride.  Romberg negative.    Thank you for allowing me to take part in the care of this patient.  Shon Millet, DO  CC:  Bertram Denver, NP  Georgian Co, PA-C

## 2023-08-04 ENCOUNTER — Ambulatory Visit: Payer: Medicaid Other | Admitting: Neurology

## 2023-08-04 ENCOUNTER — Telehealth: Payer: Self-pay

## 2023-08-04 ENCOUNTER — Other Ambulatory Visit: Payer: Self-pay

## 2023-08-04 ENCOUNTER — Encounter: Payer: Self-pay | Admitting: Neurology

## 2023-08-04 VITALS — BP 146/90 | HR 70 | Ht 60.0 in | Wt 144.2 lb

## 2023-08-04 DIAGNOSIS — G44229 Chronic tension-type headache, not intractable: Secondary | ICD-10-CM

## 2023-08-04 DIAGNOSIS — G43009 Migraine without aura, not intractable, without status migrainosus: Secondary | ICD-10-CM | POA: Diagnosis not present

## 2023-08-04 MED ORDER — AIMOVIG 140 MG/ML ~~LOC~~ SOAJ
140.0000 mg | SUBCUTANEOUS | 11 refills | Status: DC
Start: 2023-08-04 — End: 2024-05-27
  Filled 2023-08-04: qty 1, 28d supply, fill #0
  Filled 2023-09-03: qty 1, 28d supply, fill #1
  Filled 2023-10-06: qty 1, 28d supply, fill #2
  Filled 2023-10-29 – 2023-11-01 (×4): qty 1, 28d supply, fill #3
  Filled 2023-11-25 – 2023-12-12 (×2): qty 1, 28d supply, fill #4
  Filled 2024-01-06: qty 1, 28d supply, fill #5
  Filled 2024-01-07: qty 1, 30d supply, fill #5
  Filled 2024-01-12: qty 1, 28d supply, fill #5
  Filled 2024-02-05: qty 1, 28d supply, fill #6
  Filled 2024-03-02: qty 1, 28d supply, fill #7
  Filled 2024-03-31: qty 1, 28d supply, fill #8
  Filled 2024-04-27: qty 1, 28d supply, fill #9
  Filled 2024-05-23: qty 1, 28d supply, fill #10

## 2023-08-04 NOTE — Progress Notes (Unsigned)
Medication Samples have been provided to the patient.  Drug name: Bernita Raisin       Strength: 100 mg        Qty: 4   LOT: [id number]  Exp.Date: 09/2024  Dosing instructions: as needed  The patient has been instructed regarding the correct time, dose, and frequency of taking this medication, including desired effects and most common side effects.   Leida Lauth 3:44 PM 08/04/2023

## 2023-08-04 NOTE — Telephone Encounter (Signed)
Per patient PA needed for Aimovig 140 mg.

## 2023-08-04 NOTE — Patient Instructions (Addendum)
  Start Aimovig injection every 28 days.   STOP TYLENOL AND SUMATRIPTAN.  Take Ubrelvy  at earliest onset of headache.  May repeat dose once in 2 hours if needed.  Maximum 2 tablets in 24 hours.  Let me know if it works.   Limit use of pain relievers to no more than 2 days out of the week.  These medications include acetaminophen, NSAIDs (ibuprofen/Advil/Motrin, naproxen/Aleve, triptans (Imitrex/sumatriptan), Excedrin, and narcotics.  This will help reduce risk of rebound headaches. Be aware of common food triggers Routine exercise Stay adequately hydrated (aim for 64 oz water daily) Keep headache diary Maintain proper stress management Maintain proper sleep hygiene Do not skip meals Consider supplements:  magnesium citrate 400mg  daily, riboflavin 400mg  daily, coenzyme Q10 300mg  daily

## 2023-08-05 ENCOUNTER — Other Ambulatory Visit: Payer: Self-pay

## 2023-08-06 ENCOUNTER — Other Ambulatory Visit: Payer: Self-pay

## 2023-08-07 ENCOUNTER — Other Ambulatory Visit: Payer: Self-pay

## 2023-08-08 ENCOUNTER — Telehealth: Payer: Self-pay

## 2023-08-08 ENCOUNTER — Other Ambulatory Visit (HOSPITAL_COMMUNITY): Payer: Self-pay

## 2023-08-08 ENCOUNTER — Other Ambulatory Visit: Payer: Self-pay

## 2023-08-08 NOTE — Telephone Encounter (Signed)
*  [facility]  Pharmacy Patient Advocate Encounter  Received notification from [facility] that Prior Authorization for Aimovig 140MG /ML auto-injectors  has been APPROVED from 08/08/2023 to 11/05/2023   PA #/Case ID/Reference #: [id number]

## 2023-08-08 NOTE — Telephone Encounter (Signed)
PA request has been Approved. New Encounter created for follow up. For additional info see Pharmacy Prior Auth telephone encounter from 11/15.

## 2023-08-11 ENCOUNTER — Other Ambulatory Visit: Payer: Self-pay

## 2023-08-11 ENCOUNTER — Encounter: Payer: Self-pay | Admitting: Vascular Surgery

## 2023-08-12 ENCOUNTER — Other Ambulatory Visit: Payer: Self-pay

## 2023-08-12 ENCOUNTER — Encounter: Payer: Self-pay | Admitting: *Deleted

## 2023-08-12 ENCOUNTER — Other Ambulatory Visit: Payer: Self-pay | Admitting: Neurology

## 2023-08-12 MED ORDER — UBRELVY 100 MG PO TABS
1.0000 | ORAL_TABLET | ORAL | 5 refills | Status: DC | PRN
  Filled 2023-08-12: qty 16, 8d supply, fill #0
  Filled 2023-11-24 (×2): qty 16, 8d supply, fill #1
  Filled 2024-01-12: qty 16, 8d supply, fill #2

## 2023-08-18 ENCOUNTER — Other Ambulatory Visit: Payer: Self-pay

## 2023-08-19 ENCOUNTER — Other Ambulatory Visit: Payer: Self-pay

## 2023-08-19 ENCOUNTER — Telehealth: Payer: Self-pay

## 2023-08-19 NOTE — Telephone Encounter (Signed)
Per patient Rhonda Graves needs a PA.

## 2023-08-20 ENCOUNTER — Encounter: Payer: Medicaid Other | Admitting: Allergy

## 2023-08-22 ENCOUNTER — Other Ambulatory Visit: Payer: Self-pay

## 2023-08-26 ENCOUNTER — Encounter: Payer: Self-pay | Admitting: Nurse Practitioner

## 2023-08-26 ENCOUNTER — Other Ambulatory Visit: Payer: Self-pay

## 2023-08-28 ENCOUNTER — Other Ambulatory Visit: Payer: Self-pay | Admitting: Nurse Practitioner

## 2023-08-28 DIAGNOSIS — F419 Anxiety disorder, unspecified: Secondary | ICD-10-CM

## 2023-08-28 MED ORDER — HYDROXYZINE HCL 10 MG PO TABS
10.0000 mg | ORAL_TABLET | Freq: Three times a day (TID) | ORAL | 1 refills | Status: DC | PRN
Start: 2023-08-28 — End: 2023-10-09
  Filled 2023-08-28: qty 60, 20d supply, fill #0

## 2023-08-29 ENCOUNTER — Other Ambulatory Visit: Payer: Self-pay

## 2023-09-01 ENCOUNTER — Other Ambulatory Visit: Payer: Self-pay

## 2023-09-02 ENCOUNTER — Other Ambulatory Visit: Payer: Medicaid Other | Admitting: Plastic Surgery

## 2023-09-03 ENCOUNTER — Other Ambulatory Visit: Payer: Self-pay

## 2023-09-04 ENCOUNTER — Other Ambulatory Visit: Payer: Self-pay

## 2023-09-08 ENCOUNTER — Telehealth: Payer: Self-pay | Admitting: Pharmacy Technician

## 2023-09-08 ENCOUNTER — Other Ambulatory Visit (HOSPITAL_COMMUNITY): Payer: Self-pay

## 2023-09-08 ENCOUNTER — Other Ambulatory Visit: Payer: Self-pay

## 2023-09-08 NOTE — Telephone Encounter (Signed)
Pharmacy Patient Advocate Encounter   Received notification from Pt Calls Messages that prior authorization for Ubrelvy 100mg  is required/requested.   Insurance verification completed.   The patient is insured through [facility].   Per test claim: PA required; PA submitted to above mentioned insurance via CoverMyMeds Key/confirmation #/EOC [id number] Status is pending

## 2023-09-08 NOTE — Telephone Encounter (Signed)
PA request has been Submitted. New Encounter created for follow up. For additional info see Pharmacy Prior Auth telephone encounter from 09/08/23.

## 2023-09-11 ENCOUNTER — Other Ambulatory Visit (HOSPITAL_COMMUNITY): Payer: Self-pay

## 2023-09-11 NOTE — Telephone Encounter (Signed)
Pharmacy Patient Advocate Encounter  Received notification from [facility] that Prior Authorization for UBRELVY 100MG  has been DENIED.  Full denial letter will be uploaded to the media tab. See denial reason below.   PA #/Case ID/Reference #: [id number]

## 2023-09-11 NOTE — Telephone Encounter (Signed)
Pharmacy Patient Advocate Encounter  Received notification from [facility] that Prior Authorization for UBRELVY 100MG  has been APPROVED from 12.19.24 to 12.19.25. Ran test claim, Copay is $4. This test claim was processed through [facility]- copay amounts may vary at other pharmacies due to pharmacy/plan contracts, or as the patient moves through the different stages of their insurance plan.   PA #/Case ID/Reference #: [id number]     Received notification from CoverMyMeds that prior authorization for UBRELVY 100MG  is required/requested.   Insurance verification completed.   The patient is insured through [facility] .   Per test claim: PA required; PA submitted to above mentioned insurance via CoverMyMeds Key/confirmation #/EOC [facility] Status is pending

## 2023-09-14 ENCOUNTER — Encounter: Payer: Self-pay | Admitting: Allergy

## 2023-09-15 ENCOUNTER — Encounter: Payer: Medicaid Other | Admitting: Allergy

## 2023-09-15 ENCOUNTER — Other Ambulatory Visit: Payer: Self-pay

## 2023-09-15 MED ORDER — LEVOCETIRIZINE DIHYDROCHLORIDE 5 MG PO TABS
ORAL_TABLET | ORAL | 0 refills | Status: DC
  Filled 2023-09-15: qty 30, 30d supply, fill #0

## 2023-09-16 ENCOUNTER — Other Ambulatory Visit: Payer: Self-pay

## 2023-09-16 ENCOUNTER — Other Ambulatory Visit: Payer: Self-pay | Admitting: Nurse Practitioner

## 2023-09-16 NOTE — Telephone Encounter (Signed)
Requested medication (s) are due for refill today: Yes  Requested medication (s) are on the active medication list: No  Last refill:  11/20/22  Future visit scheduled: No  Notes to clinic:  Medication d/c by a different provider, unsure if patient is needing this medication, routing to provider for approval     Requested Prescriptions  Pending Prescriptions Disp Refills   traZODone (DESYREL) 100 MG tablet 90 tablet 1    Sig: Take 1 tablet (100 mg total) by mouth at bedtime as needed for sleep.     Psychiatry: Antidepressants - Serotonin Modulator Passed - 09/16/2023  4:06 PM      Passed - Valid encounter within last 6 months    Recent Outpatient Visits           3 months ago Sinus pressure   [facility] [place]. [facility], Marylene Land M, [place]   10 months ago Environmental allergies   [facility] [place]. [facility] Claiborne Rigg, NP   1 year ago Epigastric pain   [facility] Of [place]. [facility] [place], [place] W, NP   2 years ago Chronic migraine without aura without status migrainosus, not intractable   [facility] [place]. [facility] Claiborne Rigg, NP   3 years ago Need for influenza vaccination   [facility] [place]. [facility] Drucilla Chalet, RPH-CPP       Future Appointments             In 2 months Terri Piedra, DO [facility]

## 2023-09-18 ENCOUNTER — Other Ambulatory Visit: Payer: Self-pay

## 2023-09-18 MED ORDER — TRAZODONE HCL 100 MG PO TABS
100.0000 mg | ORAL_TABLET | Freq: Every evening | ORAL | 0 refills | Status: DC | PRN
  Filled 2023-09-18: qty 30, 30d supply, fill #0

## 2023-10-01 ENCOUNTER — Encounter: Payer: Self-pay | Admitting: Allergy

## 2023-10-01 ENCOUNTER — Other Ambulatory Visit: Payer: Self-pay

## 2023-10-01 ENCOUNTER — Encounter: Payer: Medicaid Other | Admitting: Allergy

## 2023-10-01 ENCOUNTER — Ambulatory Visit (HOSPITAL_COMMUNITY): Payer: Medicaid Other

## 2023-10-01 NOTE — Progress Notes (Deleted)
 Follow-up Note  RE: Rhonda Graves MRN: [id number] DOB: [birth year] Date of Office Visit: 10/01/2023   History of present illness: Rhonda Graves is a 43 y.o. female presenting today for food challenge to walnut.  She was last seen in the office on 07/03/2023 by our nurse practitioner Rhonda Graves for follow-up of food allergy , allergic rhinitis, urticaria and adverse drug reaction. She is in her usual state of health today without any recent antibiotic needs or illnesses.  She has held antihistamines for at least 3 days for challenge today. Skin testing from 07/03/2023 was negative to walnut.  Serum IgE testing to walnut from the same day is 1.29 KU/liter. Her reaction was about 20 years ago where she noted throat swelling as well as generalized swelling and required ED treatment.  Review of systems: 10pt ROS negative unless noted above in HPI  All other systems negative unless noted above in HPI  Past medical/social/surgical/family history have been reviewed and are unchanged unless specifically indicated below.  No changes  Medication List: Current Outpatient Medications  Medication Sig Dispense Refill   levocetirizine (XYZAL ) 5 MG tablet Take 1 tablet by mouth 1 time daily. May take an additional tablet as needed for itching/hives flare. 30 tablet 0   Acetaminophen  (TYLENOL  PO) Take 500 mg by mouth as needed.     cyclobenzaprine  (FLEXERIL ) 5 MG tablet Take 1 tablet (5 mg total) by mouth 3 (three) times daily as needed for muscle spasms. 60 tablet 3   diclofenac  Sodium (VOLTAREN ) 1 % GEL Apply 2 g topically 4 (four) times daily. 200 g 1   EPINEPHrine  0.3 mg/0.3 mL IJ SOAJ injection Inject 0.3 mg into the muscle as needed. 2 each 0   Erenumab -aooe (AIMOVIG ) 140 MG/ML SOAJ Inject 140 mg into the skin every 28 (twenty-eight) days. 1 mL 11   hydrOXYzine  (ATARAX ) 10 MG tablet Take 1 tablet (10 mg total) by mouth 3 (three) times daily as needed for anxiety. 60 tablet 1   Melatonin 10 MG TABS Take 1  tablet by mouth daily. 90 tablet 1   traZODone  (DESYREL ) 100 MG tablet Take 1 tablet (100 mg total) by mouth at bedtime as needed for sleep.Must have office visit for refills 30 tablet 0   Ubrogepant  (UBRELVY ) 100 MG TABS Take 1 tablet (100 mg total) by mouth as needed. May repeat after 2 hours.  Maximum 2 tablets in 24 hours. 16 tablet 5   No current facility-administered medications for this visit.     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 Uncertain what caused reaction.   Lomotil  [Diphenoxylate ] Itching and Swelling    Had allergic reaction after Lomotil , Lysteda  and Hemabate . Uncertain what caused reaction.   Lysteda  [Tranexamic Acid ] Itching and Swelling    Had allergic reaction after Lomotil , Lysteda  and Hemabate . Uncertain what caused reaction.     Physical examination: Blood pressure 118/82, pulse 72, temperature 97.9 F (36.6 C), temperature source Temporal, resp. rate 16, SpO2 100%.  General: Alert, interactive, in no acute distress. HEENT: PERRLA, TMs pearly gray, turbinates {Blank single:19197::non-edematous,edematous,edematous and pale,markedly edematous,markedly edematous and pale,moderately edematous,mildly edematous,minimally edematous} {Blank single:19197::with crusty discharge,with thick discharge,with clear discharge,without discharge}, post-pharynx {Blank single:19197::unremarkable,non erythematous,erythematous,markedly erythematous,moderately erythematous,mildly erythematous}. Neck: Supple without lymphadenopathy. Lungs: {Blank single:19197::Decreased breath sounds with expiratory  wheezing bilaterally,Mildly decreased breath sounds with expiratory wheezing bilaterally,Decreased  breath sounds bilaterally without wheezing, rhonchi or rales,Mildly decreased breath sounds bilaterally without wheezing, rhonchi or rales,Clear to auscultation without wheezing, rhonchi or rales}. {{Blank single:19197::increased work of breathing,no increased work of breathing}. CV: Normal S1, S2 without murmurs. Abdomen: Nondistended, nontender. Skin: {Blank single:19197::Dry, erythematous, excoriated patches on the ***,Dry, hyperpigmented, thickened patches on the ***,Dry, mildly hyperpigmented, mildly thickened patches on the ***,Scattered erythematous urticarial type lesions primarily located *** , nonvesicular,Warm and dry, without lesions or rashes}. Extremities:  No clubbing, cyanosis or edema. Neuro:   Grossly intact.  Diagnositics/Labs:  Food challenge to walnut with use of cold walnuts.  Benefits and risks of challenge discussed and consent obtained.  She was provided with increasing doses of walnut every 10-15 minutes and consumed total of      .  She was observed for additional hour after completion of ingestion challenge.  She had no signs/symptoms of allergic reaction.  Vitals were obtained prior to discharge and remained stable.     Assessment and plan: There are no Patient Instructions on file for this visit.  No follow-ups on file.  I appreciate the opportunity to take part in Rhonda Graves's care. Please do not hesitate to contact me with questions.  Sincerely,   Rhonda Brain, MD Allergy /Immunology [facility]

## 2023-10-02 ENCOUNTER — Ambulatory Visit (HOSPITAL_COMMUNITY)
Admission: RE | Admit: 2023-10-02 | Discharge: 2023-10-02 | Disposition: A | Discharge status: Home / Self Care | Payer: Medicaid Other | Source: Ambulatory Visit | Attending: Vascular Surgery | Admitting: Vascular Surgery

## 2023-10-02 ENCOUNTER — Other Ambulatory Visit: Payer: Self-pay

## 2023-10-02 DIAGNOSIS — I872 Venous insufficiency (chronic) (peripheral): Secondary | ICD-10-CM | POA: Diagnosis not present

## 2023-10-02 MED ORDER — LEVOCETIRIZINE DIHYDROCHLORIDE 5 MG PO TABS
ORAL_TABLET | ORAL | 5 refills | Status: DC
  Filled 2023-10-02: qty 30, fill #0
  Filled 2023-10-13: qty 30, 30d supply, fill #0
  Filled 2023-11-24 (×2): qty 30, 30d supply, fill #1
  Filled 2023-12-19: qty 30, 30d supply, fill #2
  Filled 2024-01-18: qty 30, 30d supply, fill #3
  Filled 2024-02-17: qty 30, 15d supply, fill #4
  Filled 2024-03-08 – 2024-03-11 (×3): qty 30, 15d supply, fill #5

## 2023-10-02 MED ORDER — EPINEPHRINE 0.3 MG/0.3ML IJ SOAJ
0.3000 mg | INTRAMUSCULAR | 1 refills | Status: DC | PRN
  Filled 2023-10-02: qty 2, 30d supply, fill #0
  Filled 2024-01-12: qty 2, 1d supply, fill #0

## 2023-10-02 NOTE — Progress Notes (Signed)
 Pt presented for food challenge today however where she did take prednisone  40 mg for the past 2 days after stopping her levocetirizine for the challenge and having increase in itching and burning sensations.  Due to the prednisone  use will need to reschedule this challenge as this can minimize improvements reaction symptoms if she is actually still allergic to walnut.  Discussed that we could do the challenge on levocetirizine which she takes daily for itch control and if she passes, she would be able to eat walnut in the diet as long as she is on levocetirizine. This encounter was created in error - please disregard.

## 2023-10-03 ENCOUNTER — Ambulatory Visit (HOSPITAL_COMMUNITY): Payer: Medicaid Other

## 2023-10-06 ENCOUNTER — Other Ambulatory Visit: Payer: Self-pay

## 2023-10-08 ENCOUNTER — Encounter: Payer: Self-pay | Admitting: Vascular Surgery

## 2023-10-08 ENCOUNTER — Ambulatory Visit: Payer: Medicaid Other | Admitting: Vascular Surgery

## 2023-10-08 ENCOUNTER — Encounter (HOSPITAL_COMMUNITY): Payer: Medicaid Other

## 2023-10-08 VITALS — BP 160/101 | HR 65 | Temp 98.5°F | Resp 16 | Ht 60.0 in | Wt 141.3 lb

## 2023-10-08 DIAGNOSIS — I872 Venous insufficiency (chronic) (peripheral): Secondary | ICD-10-CM

## 2023-10-08 NOTE — Progress Notes (Signed)
 Patient ID: Rhonda Graves, female   DOB: [birth year], 43 y.o.   MRN: [id number]  Reason for Consult: Follow-up   Referred by Collins Dean, NP  Subjective:     HPI:  Rhonda Graves is a 43 y.o. 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  She has never had any blood clots.  She states that after her pregnancies these became worse.  Both of her parents have varicose veins.  She has never had any bleeding or clotting in the varicose veins.  She has been wearing thigh-high compression stockings.  She states that the varicose veins are painful and only somewhat helped with compression.  They also cause itching and throbbing and usually are worse in the summer and fall in the winter and spring.  Past Medical History:  Diagnosis Date   Abnormal Pap smear    had colpo in past, normal pap since then   Anxiety    Depressive disorder    Dysmenorrhea    Eczema    GERD (gastroesophageal reflux disease) Dx 2010   Headache(784.0)    migraine   History of oral herpes simplex infection 05/29/2017   +IgM 1/2 antibody screen done for cold sores vs aphthous ulcers   IBS (irritable bowel syndrome)    Migraine    Multiple allergies    Urticaria    Family History  Problem Relation Age of Onset   Migraines Mother    Hypothyroidism Mother    Arthritis Mother    Allergic rhinitis Father    Asthma Brother    Allergic rhinitis Brother    Alzheimer's disease Maternal Grandmother    Colon cancer Paternal Grandmother    Breast cancer Neg Hx    Past Surgical History:  Procedure Laterality Date   CESAREAN SECTION N/A 11/26/2013   Procedure: CESAREAN SECTION;  Surgeon: Lenord Radon, MD;  Location: [facility];  Service: Obstetrics;  Laterality: N/A;   CESAREAN SECTION N/A 08/17/2017   Procedure: CESAREAN SECTION;  Surgeon:  Albino Hum, MD;  Location: [facility];  Service: Obstetrics;  Laterality: N/A;   COLONOSCOPY  07/11/2023   ESOPHAGOGASTRODUODENOSCOPY  05/23/2011   Mild gastritis. 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 Uncertain what caused reaction.   Lomotil  [Diphenoxylate ] Itching and Swelling    Had allergic reaction after Lomotil , Lysteda  and Hemabate . Uncertain what caused reaction.   Lysteda  [Tranexamic Acid ] Itching and Swelling    Had allergic reaction after Lomotil , Lysteda  and Hemabate . Uncertain what caused reaction.    Current Outpatient Medications  Medication Sig Dispense Refill   Acetaminophen  (TYLENOL  PO) Take 500 mg by mouth as needed.     cyclobenzaprine  (FLEXERIL ) 5 MG tablet Take 1 tablet (5 mg total) by mouth 3 (three) times daily as needed for muscle spasms. 60 tablet 3   diclofenac  Sodium (VOLTAREN ) 1 % GEL Apply  2 g topically 4 (four) times daily. 200 g 1   EPINEPHrine  0.3 mg/0.3 mL IJ SOAJ injection Inject 0.3 mg into the muscle as needed. 2 each 1   Erenumab -aooe (AIMOVIG ) 140 MG/ML SOAJ Inject 140 mg into the skin every 28 (twenty-eight) days. 1 mL 11   hydrOXYzine  (ATARAX ) 10 MG tablet Take 1 tablet (10 mg total) by mouth 3 (three) times daily as needed for anxiety. 60 tablet 1   levocetirizine (XYZAL ) 5 MG tablet Take 1 tablet by mouth 1 time daily. May take an additional tablet as needed for itching/hives flare. 30 tablet 5   Melatonin 10 MG TABS Take 1 tablet by mouth daily. 90 tablet 1   Ubrogepant  (UBRELVY )  100 MG TABS Take 1 tablet (100 mg total) by mouth as needed. May repeat after 2 hours.  Maximum 2 tablets in 24 hours. 16 tablet 5   No current facility-administered medications for this visit.    Review of Systems  Constitutional:  Constitutional negative. HENT: HENT negative.  Eyes: Eyes negative.  Respiratory: Respiratory negative.  Cardiovascular: Positive for leg swelling.  GI: Gastrointestinal negative.  Musculoskeletal: Positive for leg pain.  Skin: Skin negative.  Neurological: Neurological negative. Hematologic: Hematologic/lymphatic negative.  Psychiatric: Psychiatric negative.        Objective:  Objective   Vitals:   10/08/23 1619  BP: (!) 160/101  Pulse: 65  Resp: 16  Temp: 98.5 F (36.9 C)  SpO2: 97%  Weight: 141 lb 4.8 oz (64.1 kg)  Height: 5' (1.524 m)   Body mass index is 27.6 kg/m.  Physical Exam HENT:     Head: Normocephalic.     Nose: Nose normal.     Mouth/Throat:     Mouth: Mucous membranes are moist.  Eyes:     Pupils: Pupils are equal, round, and reactive to light.  Cardiovascular:     Rate and Rhythm: Normal rate.     Pulses: Normal pulses.  Pulmonary:     Effort: Pulmonary effort is normal.  Abdominal:     General: Abdomen is flat.  Musculoskeletal:     Cervical back: Normal range of motion.     Right lower leg: No edema.     Left lower leg: No edema.  Skin:    General: Skin is warm.     Capillary Refill: Capillary refill takes less than 2 seconds.  Neurological:     General: No focal deficit present.     Mental Status: She is alert.  Psychiatric:        Mood and Affect: Mood normal.     Data: Venous Reflux Times  +--------------+---------+------+-----------+------------+---------+  RIGHT        Reflux NoRefluxReflux TimeDiameter cmsComments                           Yes                                    +--------------+---------+------+-----------+------------+---------+  CFV                    yes    >1 second                        +--------------+---------+------+-----------+------------+---------+  FV mid        no                                               +--------------+---------+------+-----------+------------+---------+  Popliteal              yes   >1 second                        +--------------+---------+------+-----------+------------+---------+  GSV at [facility]              yes    >500 ms      0.68               +--------------+---------+------+-----------+------------+---------+  GSV prox thigh          yes    >500 ms      0.33               +--------------+---------+------+-----------+------------+---------+  GSV mid thigh           yes    >500 ms      0.38               +--------------+---------+------+-----------+------------+---------+  GSV dist thigh          yes    >500 ms      0.38               +--------------+---------+------+-----------+------------+---------+  GSV at knee             yes    >500 ms      0.39               +--------------+---------+------+-----------+------------+---------+  GSV prox calf           yes    >500 ms     0.0.26              +--------------+---------+------+-----------+------------+---------+  GSV mid calf            yes    >500 ms      0.22               +--------------+---------+------+-----------+------------+---------+  SSV Pop Fossa no                            0.9     too small  +--------------+---------+------+-----------+------------+---------+  SSV prox calf           yes    >500 ms      0.19               +--------------+---------+------+-----------+------------+---------+  SSV mid calf  no                            0.20               +--------------+---------+------+-----------+------------+---------+  AASV O        no                            0.15                +--------------+---------+------+-----------+------------+---------+         Summary:  Right:  - No evidence of deep vein thrombosis seen in the right lower extremity,  from the common femoral through the popliteal veins.  - No evidence of superficial venous thrombosis in the right lower  extremity.  - Venous reflux is noted in the right common femoral vein.  - Venous reflux is noted in the right sapheno-femoral junction.  - Venous reflux is noted in the right greater saphenous  vein in the thigh.  - Venous reflux is noted in the right greater saphenous vein in the calf.  - Venous reflux is noted in the right popliteal vein.  - Venous reflux is noted in the right short saphenous vein.  - No reflux in the AASV     LEFT          Reflux NoRefluxReflux TimeDiameter cmsComments                          Yes                                   +--------------+---------+------+-----------+------------+--------+  CFV          no                                              +--------------+---------+------+-----------+------------+--------+  FV mid        no                                              +--------------+---------+------+-----------+------------+--------+  Popliteal    no                                              +--------------+---------+------+-----------+------------+--------+  GSV at SFJ              yes    >500 ms      .702              +--------------+---------+------+-----------+------------+--------+  GSV prox thigh          yes    >500 ms      .499              +--------------+---------+------+-----------+------------+--------+  GSV mid thigh           yes    >500 ms      .543              +--------------+---------+------+-----------+------------+--------+  GSV dist thigh          yes    >500 ms      .501              +--------------+---------+------+-----------+------------+--------+  GSV at knee              yes    >500 ms      .537              +--------------+---------+------+-----------+------------+--------+  GSV prox calf           yes    >500 ms      .387              +--------------+---------+------+-----------+------------+--------+  GSV mid calf            yes    >500 ms      .384              +--------------+---------+------+-----------+------------+--------+  SSV Pop Fossa no                            .  183              +--------------+---------+------+-----------+------------+--------+  SSV prox calf no                            .154              +--------------+---------+------+-----------+------------+--------+         Summary:  Left:  - No evidence of deep vein thrombosis seen in the left lower extremity,  from the common femoral through the popliteal veins.  - No evidence of superficial venous thrombosis in the left lower  extremity.  - Venous reflux is noted in the left sapheno-femoral junction.  - Venous reflux is noted in the left greater saphenous vein in the thigh.  - Venous reflux is noted in the left greater saphenous vein in the calf.     I personally evaluated her great saphenous vein on the right leading into multiple varicosities of the right medial calf.  The saphenous vein near the junction measures 0.63 cm and in the upper thigh measures 0.55 cm distally to this measure 0.56 cm and just at the level of the knee measuring 0.63 cm again.  The right great saphenous vein leads directly into the multiple varicosities of the right medial calf      Assessment/Plan:    43 year old female with symptomatic C2 venous disease of the right lower extremity with a large refluxing greater saphenous vein that I evaluated today at bedside with an ultrasound and measures at least 0.5 cm throughout its course.  This is somewhat different than her previous measurements but certainly more in line with her symptoms where she has large  varicosities of the right medial calf.  I discussed that she would require great saphenous vein ablation from the knee where the vein is within the fascia all the way to  an intra-articular from the saphenofemoral junction, and stab phlebectomy of the right medial calf between 10 and 20 and follow-up sclerotherapy at least 1 vial.  We discussed the risk specifically the risk of blood clot and she demonstrates good understanding.  Will get this scheduled pending insurance approval.     Adine Hoof MD [facility]

## 2023-10-09 ENCOUNTER — Encounter: Payer: Self-pay | Admitting: Physician Assistant

## 2023-10-09 ENCOUNTER — Ambulatory Visit: Payer: Medicaid Other | Attending: Physician Assistant | Admitting: Physician Assistant

## 2023-10-09 ENCOUNTER — Other Ambulatory Visit: Payer: Self-pay

## 2023-10-09 VITALS — BP 133/83 | HR 80 | Ht 60.0 in | Wt 140.4 lb

## 2023-10-09 DIAGNOSIS — J069 Acute upper respiratory infection, unspecified: Secondary | ICD-10-CM

## 2023-10-09 DIAGNOSIS — F5101 Primary insomnia: Secondary | ICD-10-CM | POA: Diagnosis not present

## 2023-10-09 DIAGNOSIS — M62838 Other muscle spasm: Secondary | ICD-10-CM

## 2023-10-09 DIAGNOSIS — F419 Anxiety disorder, unspecified: Secondary | ICD-10-CM | POA: Diagnosis not present

## 2023-10-09 MED ORDER — HYDROXYZINE HCL 25 MG PO TABS
25.0000 mg | ORAL_TABLET | Freq: Every evening | ORAL | 1 refills | Status: DC | PRN
  Filled 2023-10-09: qty 60, 30d supply, fill #0

## 2023-10-09 MED ORDER — AZITHROMYCIN 250 MG PO TABS
ORAL_TABLET | ORAL | 0 refills | Status: AC
  Filled 2023-10-09: qty 6, 5d supply, fill #0

## 2023-10-09 MED ORDER — CYCLOBENZAPRINE HCL 5 MG PO TABS
5.0000 mg | ORAL_TABLET | Freq: Three times a day (TID) | ORAL | 3 refills | Status: DC | PRN
  Filled 2023-10-09: qty 60, 20d supply, fill #0
  Filled 2023-11-24 (×2): qty 60, 20d supply, fill #1
  Filled 2024-02-03: qty 60, 20d supply, fill #2
  Filled 2024-06-06: qty 60, 20d supply, fill #3

## 2023-10-09 MED ORDER — DICLOFENAC SODIUM 1 % EX GEL
2.0000 g | Freq: Four times a day (QID) | CUTANEOUS | 1 refills | Status: DC
  Filled 2023-10-09: qty 200, 24d supply, fill #0
  Filled 2024-02-12: qty 200, 24d supply, fill #1

## 2023-10-09 NOTE — Progress Notes (Signed)
Patient ID: Rhonda Graves, female   DOB: 01-11-1981, 43 y.o.   MRN: [id number]   Rhonda Graves, is a 43 y.o. female  QIO:[id number]  LKG:[id number]  DOB - 1981-01-30  Chief Complaint  Patient presents with   Anxiety       Subjective:   Rhonda Graves is a 43 y.o. female here today for several issues.  Her husband is having his thyroid removed in about 10 days.  She started getting sick with a cold about 5 days ago.  ST first, then congestion, now mild cough.  Mucus a little yellow.  No fever.  Nose was running but that resolved.  She had taken sudafed yesterday and it seemed to raise her BP to ~160/100 but resolved and normalized.  She does report taking one of her husband's 25mg  losartan at the time.  She also felt dizzy.  She took the 12 hour sudafed and is wondering if there is an effective alternative.    Also needs something for sleep.  She asks for education on benzos and ambien which I explained MOA and why they are not considered 1st line or daily meds due to dependence properties.  She does take the 10mg  hydroxyzine at times for anxiety but she has not tried taking it for sleep.  Trazadone did not help.    Needs RF flexeril for occasional use.   No problems updated.  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 Uncertain what caused reaction.   Lomotil [Diphenoxylate] Itching and Swelling    Had allergic reaction after Lomotil, Lysteda and Hemabate. Uncertain what caused reaction.   Lysteda [Tranexamic Acid] Itching and Swelling    Had allergic reaction after Lomotil, Lysteda and Hemabate. Uncertain what caused reaction.    PAST MEDICAL HISTORY: Past Medical History:  Diagnosis Date   Abnormal Pap smear    had colpo in past, normal pap since then   Anxiety     Depressive disorder    Dysmenorrhea    Eczema    GERD (gastroesophageal reflux disease) Dx 2010   Headache(784.0)    migraine   History of oral herpes simplex infection 05/29/2017   +IgM 1/2 antibody screen done for cold sores vs aphthous ulcers   IBS (irritable bowel syndrome)    Migraine    Multiple allergies    Urticaria     MEDICATIONS AT HOME: Prior to Admission medications   Medication Sig Start Date End Date Taking? Authorizing Provider  Acetaminophen (TYLENOL PO) Take 500 mg by mouth as needed.   Yes [provider]  azithromycin (ZITHROMAX) 250 MG tablet Take 2 tablets on day 1, then 1 tablet daily on days 2 through 5 10/09/23 10/14/23 Yes Analena Gama, Marzella Schlein, PA-C  EPINEPHrine 0.3 mg/0.3 mL IJ SOAJ injection Inject 0.3 mg into the muscle as needed. 10/02/23  Yes Padgett, Pilar Grammes, MD  Erenumab-aooe (AIMOVIG) 140 MG/ML SOAJ Inject 140 mg into the skin every 28 (twenty-eight) days. 08/04/23  Yes Jaffe, Adam R, DO  hydrOXYzine (ATARAX) 25 MG tablet Take 1-2 tablets (25-50 mg total) by mouth at bedtime as needed. 10/09/23  Yes Georgian Co M, PA-C  levocetirizine (XYZAL) 5 MG tablet Take 1 tablet by mouth 1 time daily. May take an additional tablet as needed for itching/hives flare. 10/02/23  Yes  Marcelyn Bruins, MD  Melatonin 10 MG TABS Take 1 tablet by mouth daily. 05/13/23  Yes Claiborne Rigg, NP  Ubrogepant (UBRELVY) 100 MG TABS Take 1 tablet (100 mg total) by mouth as needed. May repeat after 2 hours.  Maximum 2 tablets in 24 hours. 08/12/23  Yes Jaffe, Adam R, DO  cyclobenzaprine (FLEXERIL) 5 MG tablet Take 1 tablet (5 mg total) by mouth 3 (three) times daily as needed for muscle spasms. 10/09/23   Anders Simmonds, PA-C  diclofenac Sodium (VOLTAREN) 1 % GEL Apply 2 g topically 4 (four) times daily. 10/09/23   Anders Simmonds, PA-C    ROS: Neg cardiac Neg GI Neg GU Neg psych Neg neuro  Objective:   Vitals:   10/09/23 0858  BP: 133/83   Pulse: 80  SpO2: 100%  Weight: 140 lb 6.4 oz (63.7 kg)  Height: 5' (1.524 m)   Exam General appearance : Awake, alert, not in any distress. Speech Clear. Not toxic looking HEENT: Atraumatic and Normocephalic, throat with PND. MMM.  B TM with congestion but no infection Neck: Supple, no JVD. No cervical lymphadenopathy.  Chest: Good air entry bilaterally, CTAB.  No rales/rhonchi/wheezing CVS: S1 S2 regular, no murmurs.  Extremities: B/L Lower Ext shows no edema, both legs are warm to touch Neurology: Awake alert, and oriented X 3, CN II-XII intact, Non focal Skin: No Rash  Data Review Lab Results  Component Value Date   HGBA1C 4.7 (L) 06/16/2020    Assessment & Plan   1. Muscle spasms of neck - cyclobenzaprine (FLEXERIL) 5 MG tablet; Take 1 tablet (5 mg total) by mouth 3 (three) times daily as needed for muscle spasms.  Dispense: 60 tablet; Refill: 3  2. Anxiety (Primary) with insomnia-can try increased dose for bedtime bc she really hasn't been taking it in the day time.  If she needs it in the daytime she can try 1/2 of a 25.   - hydrOXYzine (ATARAX) 25 MG tablet; Take 1-2 tablets (25-50 mg total) by mouth at bedtime as needed.  Dispense: 60 tablet; Refill: 1  3. Viral URI Cover for atypicals ONLY IF she worsens - azithromycin (ZITHROMAX) 250 MG tablet; Take 2 tablets on day 1, then 1 tablet daily on days 2 through 5  Dispense: 6 tablet; Refill: 0 Try 4 hr phenylephrine.  Avoid taking husband's BP med.  Avoid sudafed  4. Primary insomnia - hydrOXYzine (ATARAX) 25 MG tablet; Take 1-2 tablets (25-50 mg total) by mouth at bedtime as needed.  Dispense: 60 tablet; Refill: 1 Sleep hygiene reviewed    Return if symptoms worsen or fail to improve.  The patient was given clear instructions to go to ER or return to medical center if symptoms don't improve, worsen or new problems develop. The patient verbalized understanding. The patient was told to call to get lab results if they  haven't heard anything in the next week.      Georgian Co, PA-C [facility] and [facility] [place], [place] [phone]   10/09/2023, 9:41 AM

## 2023-10-09 NOTE — Patient Instructions (Signed)
Phenylephrine 10 mg every 4 hours as a safer alternative to sudafed.     Vitamin C Echinacea elderberry

## 2023-10-13 ENCOUNTER — Encounter: Payer: Self-pay | Admitting: Obstetrics and Gynecology

## 2023-10-13 ENCOUNTER — Other Ambulatory Visit (HOSPITAL_COMMUNITY)
Admission: RE | Admit: 2023-10-13 | Discharge: 2023-10-13 | Disposition: A | Discharge status: Home / Self Care | Payer: Medicaid Other | Source: Ambulatory Visit | Attending: Obstetrics and Gynecology | Admitting: Obstetrics and Gynecology

## 2023-10-13 ENCOUNTER — Ambulatory Visit: Payer: Medicaid Other | Admitting: Obstetrics and Gynecology

## 2023-10-13 ENCOUNTER — Other Ambulatory Visit: Payer: Self-pay

## 2023-10-13 VITALS — BP 142/93 | HR 67 | Wt 141.7 lb

## 2023-10-13 DIAGNOSIS — Z01419 Encounter for gynecological examination (general) (routine) without abnormal findings: Secondary | ICD-10-CM

## 2023-10-13 DIAGNOSIS — N939 Abnormal uterine and vaginal bleeding, unspecified: Secondary | ICD-10-CM

## 2023-10-13 DIAGNOSIS — Z1231 Encounter for screening mammogram for malignant neoplasm of breast: Secondary | ICD-10-CM | POA: Diagnosis not present

## 2023-10-13 DIAGNOSIS — Z124 Encounter for screening for malignant neoplasm of cervix: Secondary | ICD-10-CM

## 2023-10-13 DIAGNOSIS — N9089 Other specified noninflammatory disorders of vulva and perineum: Secondary | ICD-10-CM

## 2023-10-13 MED ORDER — NYSTATIN 100000 UNIT/GM EX CREA
1.0000 | TOPICAL_CREAM | Freq: Two times a day (BID) | CUTANEOUS | 1 refills | Status: DC
  Filled 2023-10-13: qty 30, 30d supply, fill #0

## 2023-10-13 NOTE — Addendum Note (Signed)
Addended by: Clement Sayres on: 10/13/2023 03:33 PM   Modules accepted: Orders

## 2023-10-13 NOTE — Progress Notes (Addendum)
ANNUAL EXAM Patient name: Rhonda Graves MRN [id number]  Date of birth: 04/18/1981 Chief Complaint:   No chief complaint on file.  History of Present Illness:   Rhonda Graves is a 43 y.o. G49P2002 female being seen today for a routine annual exam.  Current complaints: AUB  Patient's last menstrual period was 09/20/2023 (exact date).  Periods were regular before this new bleeding pattern, with cycles 26-28 days apart. Bleeding lasted 8-9 days. Light bleeding only. Cramping with periods only. Since November, patient is having two periods per month with one week in between. They continue to last 8-9 days each with moderate bleeding. She's having more cramping with and without periods, mostly on left side. Patient has increased stress from husband's diagnosis in November.   Associated sxs: +premenstrual migraines  Patient denies pelvic pain, dyspareunia, dyschezia, dysuria, urinary frequency, urinary incontinence, constipation, ssxs anemia    The pregnancy intention screening data noted above was reviewed. Potential methods of contraception were discussed. The patient elected to proceed with No data recorded.   Last pap 06/16/20 NILM HPV neg, 01/06/17 NILM HPV neg.  H/O abnormal pap: yes, ~15 years ago with colpo Last mammogram: 07/04/23 with no abnormal findings. Family h/o breast cancer: no Last colonoscopy: 07/11/23, with one tubular adenoma, negative for high grade dysplasia and carcinoma. Family h/o colorectal cancer: yes paternal grandmother BC: None STI testing: Accepts Flu: Received Fall 2024     10/09/2023    9:04 AM 11/20/2022    4:33 PM 06/08/2021    4:23 PM 06/16/2020   10:28 AM 11/10/2019    3:24 PM  Depression screen PHQ 2/9  Decreased Interest 0 0 0 0 0  Down, Depressed, Hopeless 0 0 0 1 0  PHQ - 2 Score 0 0 0 1 0  Altered sleeping 2 3 2 1    Tired, decreased energy 2 0 2 1   Change in appetite 1 0 0 1   Feeling bad or failure about yourself  0 0 0 0   Trouble concentrating 1 0  0 0   Moving slowly or fidgety/restless 0 0 0 0   Suicidal thoughts 0 0 0 0   PHQ-9 Score 6 3 4 4    Difficult doing work/chores   Not difficult at all          10/09/2023    9:04 AM 06/08/2021    4:23 PM 06/16/2020   10:28 AM 12/31/2017    4:11 PM  GAD 7 : Generalized Anxiety Score  Nervous, Anxious, on Edge 1 0 0 0  Control/stop worrying 1 0 1 0  Worry too much - different things 2 1 0 0  Trouble relaxing 0 1 1 0  Restless 0 0 0 0  Easily annoyed or irritable 0 0 0 0  Afraid - awful might happen 0 0 0 0  Total GAD 7 Score 4 2 2  0  Anxiety Difficulty  Not difficult at all       Review of Systems:   Pertinent items are noted in HPI Denies any headaches, blurred vision, fatigue, shortness of breath, chest pain, abdominal pain, abnormal vaginal discharge/itching/odor/irritation, problems with periods, bowel movements, urination, or intercourse unless otherwise stated above. Pertinent History Reviewed:  Reviewed past medical,surgical, social and family history.  Reviewed problem list, medications and allergies. Physical Assessment:  There were no vitals filed for this visit.There is no height or weight on file to calculate BMI.        Physical Examination:  General appearance - well appearing, and in no distress  Mental status - alert, oriented to person, place, and time  Psych:  She has a normal mood and affect  Skin - warm and dry, normal color, no suspicious lesions noted  Chest - effort normal, all lung fields clear to auscultation bilaterally  Heart - normal rate and regular rhythm  Neck:  midline trachea, no thyromegaly or nodules  Breasts - breasts appear normal, no suspicious masses, no skin or nipple changes or  axillary nodes  Abdomen - soft, nontender, nondistended, no masses or organomegaly  Pelvic - VULVA: +Medial aspect of R labia majora mildly erythematous, inflamed. No masses, tenderness or lesions VAGINA: normal appearing vagina with normal color and discharge,  no lesions. CERVIX: normal appearing cervix without discharge or lesions, no CMT  Thin prep pap is done with HR HPV cotesting  Extremities:  No swelling or varicosities noted  Chaperone present for exam  Endometrial Biopsy Procedure  Patient identified, informed consent performed,  indication reviewed, consent signed.  Reviewed risk of perforation, pain, bleeding, insufficient sample, etc were reviewd. Time out was performed.  Urine pregnancy test negative.  Speculum placed in the vagina.  Cervix visualized.  Cleaned with Betadine x 2.  Anterior cervix grasped anteriorly with a single tooth tenaculum.  Paracervical block was not administered.  The pipelle was passed 9cm without difficulty and sample obtained. Tenaculum was removed, good hemostasis noted.  Patient tolerated procedure well.  Patient was given post-procedure instructions.   No results found for this or any previous visit (from the past 24 hours).  Assessment & Plan:   1. Encounter for annual routine gynecological examination (Primary) - Colonoscopy 07/11/23 with one tubular adenomatous polyp and family history of second degree relative with colon cancer. Return for colonoscopy in 7 years.  - Last mammogram 06/2023 without abnormal findings. Due 06/2024. - Breast Health: Encouraged self breast awareness/SBE. Teaching provided. Discussed limits of clinical breast exam for detecting breast cancer.  - Cervicovaginal ancillary only( [place]) - HIV antibody (with reflex) - RPR  2. Cervical cancer screening - Cytology - PAP  3. Breast cancer screening by mammogram - MM 3D SCREENING MAMMOGRAM BILATERAL BREAST; Future  4. Abnormal uterine bleeding Patient with new bleeding pattern x 2 months. Increased stress levels due to spouse's medical diagnosis, timeline concurrent with AUB onset; discussed this as a potential etiology. EMB, pap, CC/GT, and labs completed today to r/o other causes of AUB. Discussed expectant management vs  use of COCPs to regulate menses pending negative findings of today's tests. Patient states understanding and agrees with aforementioned plan.  - CBC - TSH - US PELVIC COMPLETE WITH TRANSVAGINAL; Future   5. Vulvar irritation - nystatin cream (MYCOSTATIN); Apply 1 Application topically 2 (two) times daily.  Dispense: 30 g; Refill: 1   No orders of the defined types were placed in this encounter.   Meds: No orders of the defined types were placed in this encounter.   Follow-up: No follow-ups on file.  Ralene Muskrat, PA-C 10/13/2023 12:58 PM   Attestation of Attending Supervision of Advanced Practitioner (CNM/NP): Evaluation and management procedures were performed by the Advanced Practitioner under my supervision and collaboration.  I have reviewed the Advanced Practitioner's note and chart, and I agree with the management and plan.  Peggy Constant 10/13/2023 2:59 PM

## 2023-10-13 NOTE — Progress Notes (Signed)
Pt. Presents for annual. Pt. Is concerned about having 2 periods in Nov. And 2 in Dec. Pt. Has itching, and white discharge, no odor but has red sores on her vagina.

## 2023-10-13 NOTE — Patient Instructions (Signed)
Next mammogram due October 2025.

## 2023-10-14 ENCOUNTER — Other Ambulatory Visit: Payer: Self-pay

## 2023-10-14 LAB — CERVICOVAGINAL ANCILLARY ONLY
Bacterial Vaginitis (gardnerella): NEGATIVE
Candida Glabrata: NEGATIVE
Candida Vaginitis: POSITIVE — AB
Chlamydia: NEGATIVE
Comment: NEGATIVE
Comment: NEGATIVE
Comment: NEGATIVE
Comment: NEGATIVE
Comment: NEGATIVE
Comment: NORMAL
Neisseria Gonorrhea: NEGATIVE
Trichomonas: NEGATIVE

## 2023-10-14 LAB — CBC
Hematocrit: 39.2 % (ref 34.0–46.6)
Hemoglobin: 12.8 g/dL (ref 11.1–15.9)
MCH: 31.4 pg (ref 26.6–33.0)
MCHC: 32.7 g/dL (ref 31.5–35.7)
MCV: 96 fL (ref 79–97)
Platelets: 305 10*3/uL (ref 150–450)
RBC: 4.08 x10E6/uL (ref 3.77–5.28)
RDW: 11.6 % — ABNORMAL LOW (ref 11.7–15.4)
WBC: 5.9 10*3/uL (ref 3.4–10.8)

## 2023-10-14 LAB — RPR: RPR Ser Ql: NONREACTIVE

## 2023-10-14 LAB — TSH: TSH: 1.68 u[IU]/mL (ref 0.450–4.500)

## 2023-10-14 LAB — HIV ANTIBODY (ROUTINE TESTING W REFLEX): HIV Screen 4th Generation wRfx: NONREACTIVE

## 2023-10-15 ENCOUNTER — Encounter: Payer: Self-pay | Admitting: Physician Assistant

## 2023-10-15 ENCOUNTER — Ambulatory Visit: Payer: Medicaid Other | Admitting: Neurology

## 2023-10-15 ENCOUNTER — Other Ambulatory Visit: Payer: Self-pay

## 2023-10-15 ENCOUNTER — Other Ambulatory Visit (HOSPITAL_BASED_OUTPATIENT_CLINIC_OR_DEPARTMENT_OTHER): Payer: Self-pay | Admitting: Physician Assistant

## 2023-10-15 DIAGNOSIS — B3731 Acute candidiasis of vulva and vagina: Secondary | ICD-10-CM

## 2023-10-15 LAB — SURGICAL PATHOLOGY

## 2023-10-15 MED ORDER — FLUCONAZOLE 150 MG PO TABS
150.0000 mg | ORAL_TABLET | Freq: Once | ORAL | 0 refills | Status: AC
  Filled 2023-10-15: qty 1, 1d supply, fill #0

## 2023-10-15 NOTE — Progress Notes (Signed)
Patient with positive wet prep for vulvovaginal candidiasis 10/13/23. Rx sent to pharmacy. Patient advised via MyChart.

## 2023-10-16 LAB — CYTOLOGY - PAP
Comment: NEGATIVE
Diagnosis: NEGATIVE
High risk HPV: NEGATIVE

## 2023-10-17 ENCOUNTER — Other Ambulatory Visit: Payer: Self-pay

## 2023-10-17 ENCOUNTER — Emergency Department (HOSPITAL_COMMUNITY)
Admission: EM | Admit: 2023-10-17 | Discharge: 2023-10-17 | Discharge status: Against Medical Advice | Payer: Medicaid Other | Attending: Emergency Medicine | Admitting: Emergency Medicine

## 2023-10-17 ENCOUNTER — Encounter (HOSPITAL_COMMUNITY): Payer: Self-pay | Admitting: Emergency Medicine

## 2023-10-17 DIAGNOSIS — R55 Syncope and collapse: Secondary | ICD-10-CM | POA: Diagnosis not present

## 2023-10-17 DIAGNOSIS — Z20822 Contact with and (suspected) exposure to covid-19: Secondary | ICD-10-CM | POA: Diagnosis not present

## 2023-10-17 DIAGNOSIS — Z5321 Procedure and treatment not carried out due to patient leaving prior to being seen by health care provider: Secondary | ICD-10-CM | POA: Insufficient documentation

## 2023-10-17 DIAGNOSIS — R109 Unspecified abdominal pain: Secondary | ICD-10-CM | POA: Diagnosis not present

## 2023-10-17 DIAGNOSIS — R197 Diarrhea, unspecified: Secondary | ICD-10-CM | POA: Diagnosis not present

## 2023-10-17 DIAGNOSIS — R112 Nausea with vomiting, unspecified: Secondary | ICD-10-CM | POA: Diagnosis not present

## 2023-10-17 LAB — COMPREHENSIVE METABOLIC PANEL
ALT: 49 U/L — ABNORMAL HIGH (ref 0–44)
AST: 41 U/L (ref 15–41)
Albumin: 4 g/dL (ref 3.5–5.0)
Alkaline Phosphatase: 63 U/L (ref 38–126)
Anion gap: 9 (ref 5–15)
BUN: 16 mg/dL (ref 6–20)
CO2: 21 mmol/L — ABNORMAL LOW (ref 22–32)
Calcium: 9 mg/dL (ref 8.9–10.3)
Chloride: 107 mmol/L (ref 98–111)
Creatinine, Ser: 0.74 mg/dL (ref 0.44–1.00)
GFR, Estimated: >60 mL/min (ref >60)
Glucose, Bld: 111 mg/dL — ABNORMAL HIGH (ref 70–99)
Potassium: 3.6 mmol/L (ref 3.5–5.1)
Sodium: 137 mmol/L (ref 135–145)
Total Bilirubin: 1.1 mg/dL (ref 0.0–1.2)
Total Protein: 7.2 g/dL (ref 6.5–8.1)

## 2023-10-17 LAB — CBC WITH DIFFERENTIAL/PLATELET
Abs Immature Granulocytes: 0.03 10*3/uL (ref 0.00–0.07)
Basophils Absolute: 0 10*3/uL (ref 0.0–0.1)
Basophils Relative: 0 %
Eosinophils Absolute: 0.1 10*3/uL (ref 0.0–0.5)
Eosinophils Relative: 0 %
HCT: 41.3 % (ref 36.0–46.0)
Hemoglobin: 13.7 g/dL (ref 12.0–15.0)
Immature Granulocytes: 0 %
Lymphocytes Relative: 3 %
Lymphs Abs: 0.4 10*3/uL — ABNORMAL LOW (ref 0.7–4.0)
MCH: 31.5 pg (ref 26.0–34.0)
MCHC: 33.2 g/dL (ref 30.0–36.0)
MCV: 94.9 fL (ref 80.0–100.0)
Monocytes Absolute: 0.4 10*3/uL (ref 0.1–1.0)
Monocytes Relative: 3 %
Neutro Abs: 12.3 10*3/uL — ABNORMAL HIGH (ref 1.7–7.7)
Neutrophils Relative %: 94 %
Platelets: 254 10*3/uL (ref 150–400)
RBC: 4.35 MIL/uL (ref 3.87–5.11)
RDW: 12.5 % (ref 11.5–15.5)
WBC: 13.1 10*3/uL — ABNORMAL HIGH (ref 4.0–10.5)
nRBC: 0 % (ref 0.0–0.2)

## 2023-10-17 LAB — RESP PANEL BY RT-PCR (RSV, FLU A&B, COVID)  RVPGX2
Influenza A by PCR: NEGATIVE
Influenza B by PCR: NEGATIVE
Resp Syncytial Virus by PCR: NEGATIVE
SARS Coronavirus 2 by RT PCR: NEGATIVE

## 2023-10-17 LAB — LIPASE, BLOOD: Lipase: 27 U/L (ref 11–51)

## 2023-10-17 LAB — TROPONIN I (HIGH SENSITIVITY): Troponin I (High Sensitivity): 3 ng/L (ref <18)

## 2023-10-17 LAB — HCG, QUANTITATIVE, PREGNANCY: hCG, Beta Chain, Quant, S: <1 m[IU]/mL (ref <5)

## 2023-10-17 NOTE — ED Provider Triage Note (Signed)
Emergency Medicine Provider Triage Evaluation Note  Rhonda Graves , a 43 y.o. female  was evaluated in triage.  Pt complains of a syncopal episode earlier today where she was sitting and started to get lightheaded and reportedly passed out.  She also has been feeling some abdominal pain, vomiting, diarrhea since earlier today.  Denies any chest pain or shortness of breath.  Denies any current dizziness.  Review of Systems  Positive: As above Negative: As above  Physical Exam  BP 120/81   Pulse 86   Temp 97.8 F (36.6 C)   Resp 18   LMP 09/20/2023 (Exact Date)   SpO2 100%  Gen:   Awake, no distress   Resp:  Normal effort  MSK:   Moves extremities without difficulty    Medical Decision Making  Medically screening exam initiated at 4:40 PM.  Appropriate orders placed.  Rhonda Graves was informed that the remainder of the evaluation will be completed by another provider, this initial triage assessment does not replace that evaluation, and the importance of remaining in the ED until their evaluation is complete.     Arabella Merles, PA-C 10/17/23 1640

## 2023-10-17 NOTE — ED Notes (Signed)
Called for pt for labs, no answer

## 2023-10-17 NOTE — ED Triage Notes (Signed)
Pt reports while she was with her husband after his surgery she had a syncopal episode. Pt reports she passed out due to her abdominal pain. Pt reports that started at 1100 today. Also reports vomiting and diarrhea.

## 2023-10-20 ENCOUNTER — Ambulatory Visit (HOSPITAL_BASED_OUTPATIENT_CLINIC_OR_DEPARTMENT_OTHER): Payer: Medicaid Other

## 2023-10-23 ENCOUNTER — Ambulatory Visit (HOSPITAL_COMMUNITY)
Admission: RE | Admit: 2023-10-23 | Discharge: 2023-10-23 | Disposition: A | Discharge status: Home / Self Care | Payer: Medicaid Other | Source: Ambulatory Visit | Attending: Physician Assistant | Admitting: Physician Assistant

## 2023-10-23 DIAGNOSIS — N939 Abnormal uterine and vaginal bleeding, unspecified: Secondary | ICD-10-CM | POA: Diagnosis not present

## 2023-10-27 ENCOUNTER — Other Ambulatory Visit: Payer: Self-pay | Admitting: *Deleted

## 2023-10-27 DIAGNOSIS — I83811 Varicose veins of right lower extremities with pain: Secondary | ICD-10-CM

## 2023-10-27 MED ORDER — LORAZEPAM 1 MG PO TABS: ORAL_TABLET | ORAL | 0 refills | Status: DC

## 2023-10-28 ENCOUNTER — Telehealth: Payer: Self-pay | Admitting: Gastroenterology

## 2023-10-28 NOTE — Telephone Encounter (Signed)
 Patient came into the office stating that she has been passing gas more often since having a stomach bug last week. She states that she has tried florastor, gas x, bentyl , and miralax, and nothing is helping. She states that she is very bloated and the gas is very stinky.  She says that she is having a procedure on Thursday for Varicose veins and would like to know if something could be recommended for her.

## 2023-10-28 NOTE — Telephone Encounter (Signed)
 Pt stated that she had a stomach bug last week which has resolved although pt stated that she is still having some bloating and gas. Pt was recommended to continue the miralax, florastor, gas x and bentyl . Pt was recommended to eat a bland diet until her symptoms settle down. Pt verbalized understanding with all questions answered.

## 2023-10-29 ENCOUNTER — Encounter: Payer: Medicaid Other | Admitting: Allergy

## 2023-10-29 ENCOUNTER — Other Ambulatory Visit: Payer: Self-pay | Admitting: *Deleted

## 2023-10-29 ENCOUNTER — Other Ambulatory Visit (INDEPENDENT_AMBULATORY_CARE_PROVIDER_SITE_OTHER): Payer: Medicaid Other

## 2023-10-29 ENCOUNTER — Other Ambulatory Visit: Payer: Self-pay

## 2023-10-29 DIAGNOSIS — K580 Irritable bowel syndrome with diarrhea: Secondary | ICD-10-CM

## 2023-10-29 LAB — GAMMA GT: GGT: 126 U/L — ABNORMAL HIGH (ref 7–51)

## 2023-10-30 ENCOUNTER — Encounter: Payer: Self-pay | Admitting: Vascular Surgery

## 2023-10-30 ENCOUNTER — Ambulatory Visit (INDEPENDENT_AMBULATORY_CARE_PROVIDER_SITE_OTHER): Payer: Medicaid Other | Admitting: Vascular Surgery

## 2023-10-30 ENCOUNTER — Other Ambulatory Visit: Payer: Self-pay

## 2023-10-30 VITALS — BP 131/88 | HR 82 | Temp 97.9°F | Resp 18 | Ht 60.0 in | Wt 141.0 lb

## 2023-10-30 DIAGNOSIS — I83811 Varicose veins of right lower extremities with pain: Secondary | ICD-10-CM | POA: Diagnosis not present

## 2023-10-30 HISTORY — PX: LASER ABLATION: SHX1947

## 2023-10-30 LAB — HEPATITIS B SURFACE ANTIBODY,QUALITATIVE: Hep B S Ab: REACTIVE — AB

## 2023-10-30 NOTE — Progress Notes (Signed)
     Laser Ablation Procedure    Date: 10/30/2023   Rhonda Graves DOB:1980-11-23  Consent signed: Yes      Surgeon: Penne Cain,MD  Procedure: Laser Ablation: right Greater Saphenous Vein  BP 131/88 (BP Location: Left Arm, Patient Position: Sitting, Cuff Size: Normal)   Pulse 82   Temp 97.9 F (36.6 C) (Temporal)   Resp 18   Ht 5' (1.524 m)   Wt 141 lb (64 kg)   LMP 09/20/2023 (Exact Date)   SpO2 100%   BMI 27.54 kg/m   Tumescent Anesthesia: 600 cc 0.9% NaCl with 50 cc Lidocaine  HCL 1%  and 15 cc 8.4% NaHCO3  Local Anesthesia: 20 cc Lidocaine  HCL and NaHCO3 (ratio 2:1)  7 watts continuous mode     Total energy: 1106 joules    Total time: 158 sec Treatment Length 22cm  Laser Fiber Ref. #  [id number]        Lot # [id number]   Stab Phlebectomy: 10-20  Sites: Thigh and Calf  Patient tolerated procedure well  Notes:  All staff members wore facial masks and facial shields/goggles.  Patient took Ativan  1mg  @ 7:45 am prior to the procedure and took another 1/2 tablet = Ativan  0.5mg  at 8:20am.    Description of Procedure:  After marking the course of the secondary varicosities, the patient was placed on the operating table in the supine position, and the right leg was prepped and draped in sterile fashion.   Local anesthetic was administered and under ultrasound guidance the saphenous vein was accessed with a micro needle and guide wire; then the mirco puncture sheath was placed.  A guide wire was inserted saphenofemoral junction , followed by a 5 french sheath.  The position of the sheath and then the laser fiber below the junction was confirmed using the ultrasound.  Tumescent anesthesia was administered along the course of the saphenous vein using ultrasound guidance. The patient was placed in Trendelenburg position and protective laser glasses were placed on patient and staff, and the laser was fired at 7 watts continuous mode for a total of 1106 joules.   For stab phlebectomies,  local anesthetic was administered at the previously marked varicosities, and tumescent anesthesia was administered around the vessels.  Ten to 20 stab wounds were made using the tip of an 11 blade. And using the vein hook, the phlebectomies were performed using a hemostat to avulse the varicosities.  Adequate hemostasis was achieved.   Steri strips were applied to the stab wounds and ABD pads and thigh high compression stockings were applied.  Ace wrap bandages were applied over the phlebectomy sites and at the top of the saphenofemoral junction. Blood loss was less than 15 cc.  Discharge instructions reviewed with patient and hardcopy of discharge instructions given to patient to take home. The patient ambulated out of the operating room having tolerated the procedure well.

## 2023-10-30 NOTE — Progress Notes (Signed)
 Patient name: Rhonda Graves MRN: [id number] DOB: 18-Nov-1980 Sex: female  REASON FOR VISIT: Treatment of symptomatic right lower extremity varicose veins  HPI: Rhonda Graves is a 43 y.o. female with right lower extremity varicose veins that are symptomatic with pain, swelling throbbing and itching.  She has been compliant with thigh-high compression stockings.  She presents today for treatment of varicosities.  Current Outpatient Medications  Medication Sig Dispense Refill   Acetaminophen  (TYLENOL  PO) Take 500 mg by mouth as needed.     cyclobenzaprine  (FLEXERIL ) 5 MG tablet Take 1 tablet (5 mg total) by mouth 3 (three) times daily as needed for muscle spasms. 60 tablet 3   diclofenac  Sodium (VOLTAREN ) 1 % GEL Apply 2 g topically 4 (four) times daily. 200 g 1   EPINEPHrine  0.3 mg/0.3 mL IJ SOAJ injection Inject 0.3 mg into the muscle as needed. 2 each 1   Erenumab -aooe (AIMOVIG ) 140 MG/ML SOAJ Inject 140 mg into the skin every 28 (twenty-eight) days. 1 mL 11   hydrOXYzine  (ATARAX ) 25 MG tablet Take 1-2 tablets (25-50 mg total) by mouth at bedtime as needed. 60 tablet 1   levocetirizine (XYZAL ) 5 MG tablet Take 1 tablet by mouth 1 time daily. May take an additional tablet as needed for itching/hives flare. 30 tablet 5   LORazepam  (ATIVAN ) 1 MG tablet Take 1 tablet 30 to 60 minutes before leaving house on day of office surgery.  Bring second tablet with you to office on day of office surgery. 2 tablet 0   Melatonin 10 MG TABS Take 1 tablet by mouth daily. 90 tablet 1   nystatin  cream (MYCOSTATIN ) Apply 1 Application topically 2 (two) times daily. 30 g 1   Ubrogepant  (UBRELVY ) 100 MG TABS Take 1 tablet (100 mg total) by mouth as needed. May repeat after 2 hours.  Maximum 2 tablets in 24 hours. 16 tablet 5   No current facility-administered medications for this visit.    PHYSICAL EXAM: Vitals:   10/30/23 0834  BP: 131/88  Pulse: 82  Resp: 18  Temp: 97.9 F (36.6 C)  SpO2: 100%   Awake  alert and oriented Nonlabored respirations Right lower extremity varicosities marked on the medial anterior leg with the patient standing and great saphenous vein mapped with patient lying supine  PROCEDURE: Right greater saphenous vein ablation totaling 22 centimeters and stab phlebectomy of 15 sites right medial and anterior leg  TECHNIQUE: Rhonda Graves was taken the procedure room where she was initially placed standing varicosities of the right lower extremity were marked on the skin she was then laid supine and the great saphenous vein was identified with ultrasound and traced to the saphenofemoral junction.  She was then sterilely prepped and draped in the right lower extremity in the usual fashion and a timeout was called.  We initially used ultrasound to reidentify the great saphenous vein just at the level of the knee and this area was anesthetized with 1% lidocaine  I cannulated the saphenous vein with a micropuncture needle followed by wire and a micropuncture sheath.  A J-wire was placed approximately 3 cm from the saphenofemoral junction and the laser introducer sheath was placed to that level using ultrasound guidance.  The laser was then placed.  Tumescent anesthesia was instilled along the course of the laser using ultrasound guidance and the vein was then ablated for a total of 22 cm and at completion the common femoral vein was noted to be patent and compressible.  Attention  was then turned to the multiple areas of varicosities of the right medial leg and anterior leg.  These areas were anesthetized with 1% lidocaine  and tumescent was instilled.  Stab incisions were created with 11 blade and the veins were initially grabbed with crochet hook and removed with combination of hemostat and crochet hook.  Sterile Steri-Strips were placed and a compressive wrap was placed and the patient tolerated the procedure well without any complication.  Plan will be for follow-up in 2 weeks with post ablation  duplex to rule out DVT.  [name] [phone]

## 2023-10-31 ENCOUNTER — Other Ambulatory Visit: Payer: Self-pay

## 2023-10-31 ENCOUNTER — Encounter: Payer: Self-pay | Admitting: Obstetrics and Gynecology

## 2023-11-03 ENCOUNTER — Other Ambulatory Visit: Payer: Self-pay

## 2023-11-03 ENCOUNTER — Other Ambulatory Visit: Payer: Self-pay | Admitting: Obstetrics and Gynecology

## 2023-11-03 ENCOUNTER — Telehealth: Payer: Self-pay | Admitting: *Deleted

## 2023-11-03 DIAGNOSIS — R1032 Left lower quadrant pain: Secondary | ICD-10-CM

## 2023-11-03 NOTE — Telephone Encounter (Signed)
 Called patient back regarding post procedure questions noted on patient questionnaire from 11-01-2023.  Mrs. Rhonda Graves states swelling at top of right foot improved after she removed post procedure compression dressing on Saturday afternoon. Encouraged her to continue to elevate right leg when sitting and to wear thigh high compression hose.  Discussed her questions regarding roll down of thigh high compression hose.  Encouraged her to find Spanx or Spanx like product to wear on top of her thigh high compression hose to keep the compression hose in place and prevent slipping.  Reviewed with her to take Ibuprofen  600 mg ( three 200 mg tablets) three times daily with meals for first seven days to treat inflammation.  Mrs. Rhonda Graves verbalized understanding and will reach out if she has other questions or concerns.

## 2023-11-04 ENCOUNTER — Other Ambulatory Visit: Payer: Self-pay

## 2023-11-04 ENCOUNTER — Ambulatory Visit (HOSPITAL_COMMUNITY)
Admission: RE | Admit: 2023-11-04 | Discharge: 2023-11-04 | Disposition: A | Discharge status: Home / Self Care | Payer: Medicaid Other | Source: Ambulatory Visit | Attending: Obstetrics and Gynecology | Admitting: Obstetrics and Gynecology

## 2023-11-04 DIAGNOSIS — R1032 Left lower quadrant pain: Secondary | ICD-10-CM | POA: Insufficient documentation

## 2023-11-04 DIAGNOSIS — N854 Malposition of uterus: Secondary | ICD-10-CM | POA: Diagnosis not present

## 2023-11-04 DIAGNOSIS — R102 Pelvic and perineal pain: Secondary | ICD-10-CM | POA: Diagnosis not present

## 2023-11-05 ENCOUNTER — Ambulatory Visit: Payer: Medicaid Other | Admitting: Gastroenterology

## 2023-11-05 ENCOUNTER — Other Ambulatory Visit: Payer: Self-pay

## 2023-11-05 ENCOUNTER — Encounter: Payer: Self-pay | Admitting: Gastroenterology

## 2023-11-05 VITALS — BP 110/74 | HR 60 | Ht 60.0 in | Wt 142.2 lb

## 2023-11-05 DIAGNOSIS — K581 Irritable bowel syndrome with constipation: Secondary | ICD-10-CM

## 2023-11-05 DIAGNOSIS — K760 Fatty (change of) liver, not elsewhere classified: Secondary | ICD-10-CM | POA: Diagnosis not present

## 2023-11-05 DIAGNOSIS — R748 Abnormal levels of other serum enzymes: Secondary | ICD-10-CM | POA: Diagnosis not present

## 2023-11-05 DIAGNOSIS — Z800 Family history of malignant neoplasm of digestive organs: Secondary | ICD-10-CM

## 2023-11-05 MED ORDER — LINACLOTIDE 72 MCG PO CAPS
72.0000 ug | ORAL_CAPSULE | Freq: Every day | ORAL | 6 refills | Status: DC
  Filled 2023-11-05 – 2023-11-11 (×2): qty 30, 30d supply, fill #0

## 2023-11-05 NOTE — Progress Notes (Unsigned)
Chief Complaint: FU  Referring Provider:  Claiborne Rigg, NP      ASSESSMENT AND PLAN;   #1. IBS-C, prev diarrhea.   #2. Abn AST/ALT- likely d/t fatty liver (wt gain/ETOH). Neg acute hep panel, neg iron studies, ceruloplasmin, alpha-1 antitrypsin. Immune to A/B  #3. FH CRC (GM at age 43)  Plan:  -USE  -CBC, CMP, GGT, Fib-4 in 1 month. If abn, then autoimmune WU -Stop all ETOH -Lose wt -Linzess every day (samples)   HPI:    Rhonda Graves is a 43 y.o. female  With H/O migraines, anxiety, urticaria, neg celiac screen,   significant allergies  Discussed the use of AI scribe software for clinical note transcription with the patient, who gave verbal consent to proceed.  History of Present Illness   Rhonda Graves is a 43 year old female with fatty liver who presents with gastrointestinal issues following a viral illness.  She experienced gastrointestinal issues following a viral illness that began on January 24th, coinciding with her husband's thyroidectomy for cancer. She became severely ill with vomiting and diarrhea, which lasted less than 24 hours, leading to significant dehydration and a brief loss of consciousness at the hospital. Since the illness, she has noticed a significant change in her bowel habits.  Post-viral, she describes a transition to constipation, experiencing infrequent bowel movements, going up to four days without a bowel movement, a significant change from her usual pattern of three to four times daily. She has tried Pharmacologist without improvement, and reports increased gas and odor with these treatments. She continues to take magnesium, but it is not alleviating her symptoms. Her stools require straining and do not reflect her dietary intake, with a sensation of incomplete evacuation. She has also noted weight fluctuations, losing weight during the illness and regaining it afterward, currently weighing 141 pounds.  Regarding her liver  condition, she has a history of fatty liver with recent liver function tests showing improvement. She was checked for hepatitis, which was negative, indicating immunity to hepatitis B. She acknowledges alcohol consumption, which she has reduced by 70% since December, but finds it challenging to quit completely due to stress related to her husband's cancer diagnosis. She has an upcoming appointment with behavioral health in March to address this issue.  She reports left-sided abdominal pain during her menstrual periods, which was investigated by her gynecologist, but the ovary was not visualized due to intestinal coverage. She suspects the pain may be related to her intestines, as it improves after bowel movements.  She underwent varicose vein surgery on her leg last Thursday and has been advised to avoid physical activity for two weeks. Prior to this, she was active, engaging in rowing and running, but has been limited to walking since mid-January.        From prev notes: C/O longstanding H/O diarrhea (>15 yrs) Recently getting worse after taking magnesium supplements.  She gets better if she stops magnesium but then starts having muscle cramps. Rare constipation (she used to get more constipation previously) Has been taking magnesium supplements at night and fish oil.   Wt Readings from Last 3 Encounters:  11/05/23 142 lb 4 oz (64.5 kg)  10/30/23 141 lb (64 kg)  10/13/23 141 lb 11.2 oz (64.3 kg)    Past GI workup  Colon 06/2023 - One 2 mm polyp in the cecum, removed with a cold biopsy forceps. Resected and retrieved. - The entire examined colon is normal. Biopsied. - Non-  bleeding internal hemorrhoids. - The examined portion of the ileum was normal. - The examination was otherwise normal on direct and retroflexion views. -Bx: TA, neg random colon biopsies for microscopic colitis. -Rpt 7 yrs      Latest Ref Rng & Units 10/17/2023    4:45 PM 05/29/2023    7:56 AM 11/22/2022    8:55 AM   Hepatic Function  Total Protein 6.5 - 8.1 g/dL 7.2  6.9  6.8   Albumin 3.5 - 5.0 g/dL 4.0  4.1  4.5   AST 15 - 41 U/L 41  39  30   ALT 0 - 44 U/L 49  58  42   Alk Phosphatase 38 - 126 U/L 63  67  87   Total Bilirubin 0.0 - 1.2 mg/dL 1.1  0.5  0.8       FH (GM at 50s with CRC)-mom or dad did not have any polyps.  Mom with IBS.  Brother with IBS.    Wt Readings from Last 3 Encounters:  11/05/23 142 lb 4 oz (64.5 kg)  10/30/23 141 lb (64 kg)  10/13/23 141 lb 11.2 oz (64.3 kg)   Past GI workup: EGD 05/23/2011 -Mild gastritis -Neg CLO -Neg SB Bx for celiac.  [place] 2012: neg HIDA with EF 2012: neg  Also had negative H. pylori breath test Neg celiac screen serology.    SH- married.  Has 2 daughters   Wt Readings from Last 3 Encounters:  11/05/23 142 lb 4 oz (64.5 kg)  10/30/23 141 lb (64 kg)  10/13/23 141 lb 11.2 oz (64.3 kg)     Past Medical History:  Diagnosis Date   Abnormal Pap smear    had colpo in past, normal pap since then   Anxiety    Depressive disorder    Dysmenorrhea    Eczema    GERD (gastroesophageal reflux disease) Dx 2010   Headache(784.0)    migraine   History of oral herpes simplex infection 05/29/2017   +IgM 1/2 antibody screen done for cold sores vs aphthous ulcers   IBS (irritable bowel syndrome)    Migraine    Multiple allergies    Urticaria     Past Surgical History:  Procedure Laterality Date   CESAREAN SECTION N/A 11/26/2013   Procedure: CESAREAN SECTION;  Surgeon: Willodean Rosenthal, MD;  Location: [facility];  Service: Obstetrics;  Laterality: N/A;   CESAREAN SECTION N/A 08/17/2017   Procedure: CESAREAN SECTION;  Surgeon: Tilda Burrow, MD;  Location: [facility];  Service: Obstetrics;  Laterality: N/A;   COLONOSCOPY  07/11/2023   ESOPHAGOGASTRODUODENOSCOPY  05/23/2011   Mild gastritis. Otherwise normal EGD   LASER ABLATION Right 10/30/2023   Right Greater saphenous Vein with 10-20 stab phlebectomies    Family  History  Problem Relation Age of Onset   Migraines Mother    Hypothyroidism Mother    Arthritis Mother    Allergic rhinitis Father    Asthma Brother    Allergic rhinitis Brother    Alzheimer's disease Maternal Grandmother    Colon cancer Paternal Grandmother    Breast cancer Neg Hx     Social History   Tobacco Use   Smoking status: Never    Passive exposure: Never   Smokeless tobacco: Never  Vaping Use   Vaping status: Never Used  Substance Use Topics   Alcohol use: Yes    Comment: occasionally    Drug use: No    Current Outpatient Medications  Medication Sig  Dispense Refill   Acetaminophen (TYLENOL PO) Take 500 mg by mouth as needed.     cyclobenzaprine (FLEXERIL) 5 MG tablet Take 1 tablet (5 mg total) by mouth 3 (three) times daily as needed for muscle spasms. 60 tablet 3   diclofenac Sodium (VOLTAREN) 1 % GEL Apply 2 g topically 4 (four) times daily. 200 g 1   EPINEPHrine 0.3 mg/0.3 mL IJ SOAJ injection Inject 0.3 mg into the muscle as needed. 2 each 1   Erenumab-aooe (AIMOVIG) 140 MG/ML SOAJ Inject 140 mg into the skin every 28 (twenty-eight) days. 1 mL 11   levocetirizine (XYZAL) 5 MG tablet Take 1 tablet by mouth 1 time daily. May take an additional tablet as needed for itching/hives flare. 30 tablet 5   Melatonin 10 MG TABS Take 1 tablet by mouth daily. 90 tablet 1   Ubrogepant (UBRELVY) 100 MG TABS Take 1 tablet (100 mg total) by mouth as needed. May repeat after 2 hours.  Maximum 2 tablets in 24 hours. 16 tablet 5   No current facility-administered medications for this visit.    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 Uncertain what caused reaction.   Lomotil [Diphenoxylate] Itching and Swelling    Had allergic reaction after Lomotil, Lysteda  and Hemabate. Uncertain what caused reaction.   Lysteda [Tranexamic Acid] Itching and Swelling    Had allergic reaction after Lomotil, Lysteda and Hemabate. Uncertain what caused reaction.    Review of Systems:  Constitutional: Denies fever, chills, diaphoresis, appetite change and fatigue.  HEENT: Has multiple allergies. Respiratory: Denies SOB, DOE, cough, chest tightness,  and wheezing.   Cardiovascular: Denies chest pain, palpitations and leg swelling.  Genitourinary: Denies dysuria, urgency, frequency, hematuria, flank pain and difficulty urinating.  Musculoskeletal: Denies myalgias, back pain, joint swelling, arthralgias and gait problem.  Skin: No rash.  Neurological: Denies dizziness, seizures, syncope, weakness, light-headedness, numbness and has headaches.  Hematological: Denies adenopathy. Easy bruising, personal or family bleeding history  Psychiatric/Behavioral: has anxiety or depression     Physical Exam:    BP 110/74 (BP Location: Left Arm, Patient Position: Sitting, Cuff Size: Normal)   Pulse 60   Ht 5' (1.524 m)   Wt 142 lb 4 oz (64.5 kg)   LMP 09/20/2023 (Exact Date)   BMI 27.78 kg/m  Wt Readings from Last 3 Encounters:  11/05/23 142 lb 4 oz (64.5 kg)  10/30/23 141 lb (64 kg)  10/13/23 141 lb 11.2 oz (64.3 kg)   Constitutional:  Well-developed, in no acute distress. Psychiatric: Normal mood and affect. Behavior is normal. HEENT: Pupils normal.  Conjunctivae are normal. No scleral icterus. Cardiovascular: Normal rate, regular rhythm. No edema Pulmonary/chest: Effort normal and breath sounds normal. No wheezing, rales or rhonchi. Abdominal: Soft, nondistended. Nontender. Bowel sounds active throughout. There are no masses palpable. No hepatomegaly. Rectal: Deferred Neurological: Alert and oriented to person place and time. Skin: Skin is warm and dry. No rashes noted.  Data Reviewed: I have personally reviewed following labs and imaging studies  CBC:     Latest Ref Rng & Units 10/17/2023    4:45 PM 10/13/2023    2:13 PM 05/29/2023    7:56 AM  CBC  WBC 4.0 - 10.5 K/uL 13.1  5.9  5.3   Hemoglobin 12.0 -  15.0 g/dL 16.1  09.6  04.5   Hematocrit 36.0 - 46.0 % 41.3  39.2  37.7   Platelets 150 - 400 K/uL 254  305  228.0     CMP:    Latest Ref Rng & Units 10/17/2023    4:45 PM 05/29/2023    7:56 AM 11/22/2022    8:55 AM  CMP  Glucose 70 - 99 mg/dL 409  84  91   BUN 6 - 20 mg/dL 16  12  12    Creatinine 0.44 - 1.00 mg/dL 8.11  9.14  7.82   Sodium 135 - 145 mmol/L 137  137  139   Potassium 3.5 - 5.1 mmol/L 3.6  4.2  4.0   Chloride 98 - 111 mmol/L 107  105  104   CO2 22 - 32 mmol/L 21  26  21    Calcium 8.9 - 10.3 mg/dL 9.0  9.3  9.4   Total Protein 6.5 - 8.1 g/dL 7.2  6.9  6.8   Total Bilirubin 0.0 - 1.2 mg/dL 1.1  0.5  0.8   Alkaline Phos 38 - 126 U/L 63  67  87   AST 15 - 41 U/L 41  39  30   ALT 0 - 44 U/L 49  58  42         Edman Circle, MD 11/05/2023, 4:27 PM  Cc: Claiborne Rigg, NP

## 2023-11-05 NOTE — Patient Instructions (Signed)
_______________________________________________________  If your blood pressure at your visit was 140/90 or greater, please contact your primary care physician to follow up on this.  _______________________________________________________  If you are age 42 or older, your body mass index should be between 23-30. Your Body mass index is 27.78 kg/m. If this is out of the aforementioned range listed, please consider follow up with your Primary Care Provider.  If you are age 68 or younger, your body mass index should be between 19-25. Your Body mass index is 27.78 kg/m. If this is out of the aformentioned range listed, please consider follow up with your Primary Care Provider.   ________________________________________________________  The [facility] providers would like to encourage you to use [facility] to communicate with providers for non-urgent requests or questions.  Due to long hold times on the telephone, sending your provider a message by [facility] may be a faster and more efficient way to get a response.  Please allow 48 business hours for a response.  Please remember that this is for non-urgent requests.  _______________________________________________________  Your provider has requested that you go to the basement level for lab work in 1 month. Please come back after 12-03-23 for blood work. Press "B" on the elevator. The lab is located at the first door on the left as you exit the elevator.  Stop all alcohol  We have sent the following medications to your pharmacy for you to pick up at your convenience: Linzess  You have been scheduled for an abdominal ultrasound at [facility] (1st floor of hospital) on 11-10-23 at 930am. Please arrive 30 minutes prior to your appointment for registration. Make certain not to have anything to eat or drink midnight prior to your appointment. Should you need to reschedule your appointment, please contact radiology at [phone]. This test  typically takes about 30 minutes to perform.  Try to lose weight gradually.  Thank you,  Dr. Lynann Bologna

## 2023-11-06 ENCOUNTER — Encounter: Payer: Self-pay | Admitting: Obstetrics and Gynecology

## 2023-11-07 ENCOUNTER — Other Ambulatory Visit: Payer: Self-pay

## 2023-11-07 ENCOUNTER — Telehealth: Payer: Self-pay

## 2023-11-07 NOTE — Telephone Encounter (Signed)
Spoke with April with pre-cert team & patient's insurance does not cover the [place] Abd Complete w/elastography [phone]) that is scheduled for next Thursday 11/13/23. It will only cover US Renal ([id number]). Pt made aware. Routed to Dr. Chales Abrahams for review & if he has any additional recommendations/alternatives.

## 2023-11-10 ENCOUNTER — Ambulatory Visit (HOSPITAL_COMMUNITY): Payer: Medicaid Other

## 2023-11-10 ENCOUNTER — Other Ambulatory Visit: Payer: Self-pay

## 2023-11-11 ENCOUNTER — Other Ambulatory Visit: Payer: Self-pay

## 2023-11-11 ENCOUNTER — Ambulatory Visit: Payer: Medicaid Other | Admitting: Neurology

## 2023-11-11 NOTE — Telephone Encounter (Signed)
 Hold off on USE (if not covered by insurance) Please let pt know.  She is our [place] interpreter RG

## 2023-11-12 NOTE — Telephone Encounter (Signed)
 Pt made aware of Dr. Chales Abrahams recommendations: Pt verbalized understanding with all questions answered.

## 2023-11-13 ENCOUNTER — Ambulatory Visit (HOSPITAL_COMMUNITY): Payer: Medicaid Other | Attending: Vascular Surgery

## 2023-11-13 ENCOUNTER — Encounter (HOSPITAL_COMMUNITY): Payer: Self-pay

## 2023-11-13 ENCOUNTER — Encounter: Payer: Medicaid Other | Admitting: Vascular Surgery

## 2023-11-13 ENCOUNTER — Ambulatory Visit (HOSPITAL_COMMUNITY): Payer: Medicaid Other

## 2023-11-17 ENCOUNTER — Other Ambulatory Visit: Payer: Self-pay

## 2023-11-17 ENCOUNTER — Encounter (HOSPITAL_COMMUNITY): Payer: Medicaid Other

## 2023-11-17 ENCOUNTER — Encounter: Payer: Self-pay | Admitting: Dermatology

## 2023-11-17 ENCOUNTER — Ambulatory Visit: Payer: Medicaid Other | Admitting: Dermatology

## 2023-11-17 VITALS — BP 133/93

## 2023-11-17 DIAGNOSIS — L814 Other melanin hyperpigmentation: Secondary | ICD-10-CM | POA: Diagnosis not present

## 2023-11-17 DIAGNOSIS — L578 Other skin changes due to chronic exposure to nonionizing radiation: Secondary | ICD-10-CM | POA: Diagnosis not present

## 2023-11-17 DIAGNOSIS — Z1283 Encounter for screening for malignant neoplasm of skin: Secondary | ICD-10-CM | POA: Diagnosis not present

## 2023-11-17 DIAGNOSIS — D1801 Hemangioma of skin and subcutaneous tissue: Secondary | ICD-10-CM | POA: Diagnosis not present

## 2023-11-17 DIAGNOSIS — W908XXA Exposure to other nonionizing radiation, initial encounter: Secondary | ICD-10-CM

## 2023-11-17 DIAGNOSIS — D229 Melanocytic nevi, unspecified: Secondary | ICD-10-CM

## 2023-11-17 DIAGNOSIS — D485 Neoplasm of uncertain behavior of skin: Secondary | ICD-10-CM

## 2023-11-17 DIAGNOSIS — L821 Other seborrheic keratosis: Secondary | ICD-10-CM | POA: Diagnosis not present

## 2023-11-17 DIAGNOSIS — L719 Rosacea, unspecified: Secondary | ICD-10-CM

## 2023-11-17 DIAGNOSIS — D492 Neoplasm of unspecified behavior of bone, soft tissue, and skin: Secondary | ICD-10-CM | POA: Diagnosis not present

## 2023-11-17 DIAGNOSIS — D225 Melanocytic nevi of trunk: Secondary | ICD-10-CM

## 2023-11-17 MED ORDER — IVERMECTIN 1 % EX CREA
1.0000 | TOPICAL_CREAM | Freq: Two times a day (BID) | CUTANEOUS | 4 refills | Status: DC
  Filled 2023-11-17: qty 45, 34d supply, fill #0

## 2023-11-17 NOTE — Patient Instructions (Signed)

## 2023-11-17 NOTE — Progress Notes (Signed)
 New Patient Visit   Subjective  Rhonda Graves is a 43 y.o. female who presents for the following: Skin Cancer Screening and Full Body Skin Exam - No history of skin cancer. No family history of Melanoma. She is concerning about 2 moles in particular, one on her left side and one on her abdomen. She also would like to see if there is a treatment for her rosacea. She used metronidazole years ago and she has had 3 laser treatments with Dr Ulice Bold.  The patient presents for Total-Body Skin Exam (TBSE) for skin cancer screening and mole check. The patient has spots, moles and lesions to be evaluated, some may be new or changing and the patient may have concern these could be cancer.    The following portions of the chart were reviewed this encounter and updated as appropriate: medications, allergies, medical history  Review of Systems:  No other skin or systemic complaints except as noted in HPI or Assessment and Plan.  Objective  Well appearing patient in no apparent distress; mood and affect are within normal limits.  A full examination was performed including scalp, head, eyes, ears, nose, lips, neck, chest, axillae, abdomen, back, buttocks, bilateral upper extremities, bilateral lower extremities, hands, feet, fingers, toes, fingernails, and toenails. All findings within normal limits unless otherwise noted below.   Relevant physical exam findings are noted in the Assessment and Plan.  Right Buttock 5 mm irregular brown macule   Assessment & Plan   SKIN CANCER SCREENING PERFORMED TODAY.  ACTINIC DAMAGE - Chronic condition, secondary to cumulative UV/sun exposure - diffuse scaly erythematous macules with underlying dyspigmentation - Recommend daily broad spectrum sunscreen SPF 30+ to sun-exposed areas, reapply every 2 hours as needed.  - Staying in the shade or wearing long sleeves, sun glasses (UVA+UVB protection) and wide brim hats (4-inch brim around the entire circumference of  the hat) are also recommended for sun protection.  - Call for new or changing lesions.  LENTIGINES, SEBORRHEIC KERATOSES, HEMANGIOMAS - Benign normal skin lesions - Benign-appearing - Call for any changes  MELANOCYTIC NEVI - Tan-brown and/or pink-flesh-colored symmetric macules and papules - Benign appearing on exam today - Observation - Call clinic for new or changing moles - Recommend daily use of broad spectrum spf 30+ sunscreen to sun-exposed areas.   ROSACEA Exam Mid face erythema with telangiectasias   Rosacea is a chronic progressive skin condition usually affecting the face of adults, causing redness and/or acne bumps. It is treatable but not curable. It sometimes affects the eyes (ocular rosacea) as well. It may respond to topical and/or systemic medication and can flare with stress, sun exposure, alcohol, exercise, topical steroids (including hydrocortisone/cortisone 10) and some foods.  Daily application of broad spectrum spf 30+ sunscreen to face is recommended to reduce flares.  Treatment Plan Soolantra cream apply to face twice daily   NEOPLASM OF UNCERTAIN BEHAVIOR OF SKIN Right Buttock Epidermal / dermal shaving  Lesion diameter (cm):  0.5 Informed consent: discussed and consent obtained   Timeout: patient name, date of birth, surgical site, and procedure verified   Procedure prep:  Patient was prepped and draped in usual sterile fashion Prep type:  Isopropyl alcohol Anesthesia: the lesion was anesthetized in a standard fashion   Anesthetic:  1% lidocaine w/ epinephrine 1-100,000 buffered w/ 8.4% NaHCO3 Instrument used: flexible razor blade   Hemostasis achieved with: pressure, aluminum chloride and electrodesiccation   Outcome: patient tolerated procedure well   Post-procedure details: sterile dressing applied and  wound care instructions given   Dressing type: bandage and petrolatum   Specimen 1 - Surgical pathology Differential Diagnosis: Nevus vs dysplastic  nevus  Check Margins: No  Return in about 4 months (around 03/16/2024) for Rosacea follow up.  I, Joanie Coddington, CMA, am acting as scribe for [employer], DO .   Documentation: I have reviewed the above documentation for accuracy and completeness, and I agree with the above.  Langston Reusing, DO

## 2023-11-18 ENCOUNTER — Other Ambulatory Visit: Payer: Self-pay

## 2023-11-18 ENCOUNTER — Ambulatory Visit (INDEPENDENT_AMBULATORY_CARE_PROVIDER_SITE_OTHER): Payer: Self-pay | Admitting: Mental Health

## 2023-11-18 ENCOUNTER — Encounter: Payer: Self-pay | Admitting: Dermatology

## 2023-11-18 ENCOUNTER — Encounter: Payer: Medicaid Other | Admitting: Family

## 2023-11-18 DIAGNOSIS — F1020 Alcohol dependence, uncomplicated: Secondary | ICD-10-CM | POA: Diagnosis not present

## 2023-11-18 DIAGNOSIS — F411 Generalized anxiety disorder: Secondary | ICD-10-CM | POA: Insufficient documentation

## 2023-11-18 LAB — SURGICAL PATHOLOGY
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
## 2023-11-18 NOTE — Progress Notes (Signed)
 Hi Rhonda Graves,  Dr. Onalee Hua reviewed your biopsy results and they showed the spot removed was a little "abnormal" but not cancerous.  No additional treatment is required.  We will continue to monitor the area for re-pigmentation during your annual skin exams. The detailed report is available to view in MyChart.  Have a great day!  Kind Regards,  Dr. Kermit Balo Care Team

## 2023-11-19 ENCOUNTER — Encounter: Payer: Medicaid Other | Admitting: Vascular Surgery

## 2023-11-19 ENCOUNTER — Other Ambulatory Visit: Payer: Self-pay

## 2023-11-20 ENCOUNTER — Other Ambulatory Visit: Payer: Self-pay

## 2023-11-24 ENCOUNTER — Other Ambulatory Visit: Payer: Self-pay

## 2023-11-25 ENCOUNTER — Other Ambulatory Visit: Payer: Self-pay

## 2023-11-27 ENCOUNTER — Ambulatory Visit (INDEPENDENT_AMBULATORY_CARE_PROVIDER_SITE_OTHER): Payer: Medicaid Other | Admitting: Vascular Surgery

## 2023-11-27 ENCOUNTER — Other Ambulatory Visit: Payer: Self-pay

## 2023-11-27 ENCOUNTER — Ambulatory Visit (HOSPITAL_COMMUNITY)
Admission: RE | Admit: 2023-11-27 | Discharge: 2023-11-27 | Disposition: A | Discharge status: Home / Self Care | Payer: Medicaid Other | Source: Ambulatory Visit | Attending: Vascular Surgery | Admitting: Vascular Surgery

## 2023-11-27 VITALS — BP 131/89 | HR 74 | Temp 97.6°F | Wt 142.0 lb

## 2023-11-27 DIAGNOSIS — I83811 Varicose veins of right lower extremities with pain: Secondary | ICD-10-CM

## 2023-11-27 NOTE — Progress Notes (Signed)
 Subjective:     Patient ID: Rhonda Graves, female   DOB: 03-Apr-1981, 43 y.o.   MRN: [id number]  HPI 43 year old female follows up status post right greater saphenous vein ablation and stab ectomy of 15 sites of her right medial and anterior leg.  She did have pain this is improving over the past couple days.  She still has some deep aching is using some over-the-counter heparinized gels from [place] which do help and also has Voltaren gel and has been limiting her oral ibuprofen intake secondary to neurology recommendations.  She is continue with compression stockings.   Review of Systems As above    Objective:   Physical Exam Vitals:   11/27/23 1042  BP: 131/89  Pulse: 74  Temp: 97.6 F (36.4 C)  SpO2: 100%      Venous Reflux Times  +--------------+--------+------+----------+------------+-------------------  ----+  RIGHT        Reflux  Reflux  Reflux  Diameter cmsComments                                No       Yes     Time                                         +--------------+--------+------+----------+------------+-------------------  ----+  GSV at [facility]                                        >4.0 cm from  junction    +--------------+--------+------+----------+------------+-------------------  ----+  GSV prox thigh         yes   >500 ms      0.50                              +--------------+--------+------+----------+------------+-------------------  ----+  GSV mid thigh                                     prior                                                                       ablation/stripping        +--------------+--------+------+----------+------------+-------------------  ----+  GSV dist thigh                                    prior                                                                       ablation/stripping         +--------------+--------+------+----------+------------+-------------------  ----+  GSV at knee                                       prior                                                                       ablation/stripping        +--------------+--------+------+----------+------------+-------------------  ----+  GSV prox calf          yes   >500 ms      0.40                              +--------------+--------+------+----------+------------+-------------------  ----+  SSV Pop Fossa          yes   >500 ms      0.20                              +--------------+--------+------+----------+------------+-------------------  ----+     Summary:  Right:  - No evidence of deep vein thrombosis within the right lower extremity.  - Successful ablation of the right great saphenous vein from the mid thigh  to the knee. - Great saphenous ablation length from saphenofemoral  junction is >4.0 cm.  - Great saphenous vein in the proximal thigh remains patent with reflux.  The great saphenous vein below the knee remains patent with reflux.  - Small saphenous vein is patent with reflux.     Assessment:     43 year old female status post right greater saphenous vein ablation and stab phlebectomy of 15 sites of the right medial and anterior leg now recovering well with satisfactory duplex today    Plan:     She can follow-up as needed to evaluate treatment of left lower extremity venous reflux although there are only very small reticular veins in the left very large refluxing great saphenous vein.  She was given business card for Cherene Julian, RN to contact to schedule right lower extremity sclerotherapy.     Smitty Ackerley C. Randie Heinz, MD [facility] Office: [phone] Pager: [phone]

## 2023-12-01 ENCOUNTER — Other Ambulatory Visit: Payer: Self-pay

## 2023-12-02 ENCOUNTER — Telehealth: Payer: Self-pay

## 2023-12-02 ENCOUNTER — Encounter: Payer: Medicaid Other | Admitting: Family

## 2023-12-02 ENCOUNTER — Other Ambulatory Visit: Payer: Self-pay

## 2023-12-02 ENCOUNTER — Encounter: Payer: Self-pay | Admitting: Neurology

## 2023-12-02 NOTE — Telephone Encounter (Signed)
 PA team can you check the Prior auths for this patient's medications aimovig and Ubrevly.

## 2023-12-03 ENCOUNTER — Other Ambulatory Visit: Payer: Self-pay

## 2023-12-04 ENCOUNTER — Ambulatory Visit (HOSPITAL_COMMUNITY): Payer: Medicaid Other

## 2023-12-05 ENCOUNTER — Other Ambulatory Visit: Payer: Self-pay

## 2023-12-08 ENCOUNTER — Other Ambulatory Visit: Payer: Self-pay

## 2023-12-10 ENCOUNTER — Other Ambulatory Visit: Payer: Self-pay

## 2023-12-11 ENCOUNTER — Telehealth: Payer: Self-pay | Admitting: Pharmacist

## 2023-12-11 ENCOUNTER — Other Ambulatory Visit (HOSPITAL_COMMUNITY): Payer: Self-pay

## 2023-12-11 NOTE — Telephone Encounter (Signed)
 Pharmacy Patient Advocate Encounter   Received notification from Physician's Office that prior authorization for Aimovig 140MG /ML auto-injectors is required/requested.   Insurance verification completed.   The patient is insured through [facility] .   Per test claim: PA required; PA submitted to above mentioned insurance via CoverMyMeds Key/confirmation #/EOC [id number] Status is pending

## 2023-12-11 NOTE — Telephone Encounter (Signed)
 Pharmacy Patient Advocate Encounter  Received notification from [facility] that Prior Authorization for AIMOVIG 140MG  has been APPROVED from 3.20.25 to 3.20.26. Ran test claim, Copay is $4. This test claim was processed through [facility]- copay amounts may vary at other pharmacies due to pharmacy/plan contracts, or as the patient moves through the different stages of their insurance plan.   PA #/Case ID/Reference #: [id number]

## 2023-12-12 ENCOUNTER — Other Ambulatory Visit: Payer: Self-pay

## 2023-12-12 ENCOUNTER — Encounter: Admitting: Allergy

## 2023-12-16 ENCOUNTER — Other Ambulatory Visit: Payer: Self-pay

## 2023-12-18 NOTE — Telephone Encounter (Signed)
 Prior Authorization for AIMOVIG 140MG  has been APPROVED from 3.20.25 to 3.20.26  Previous approval for Ubrelvy through 09/10/2024

## 2023-12-23 ENCOUNTER — Encounter (HOSPITAL_COMMUNITY): Payer: Self-pay

## 2023-12-23 ENCOUNTER — Ambulatory Visit (INDEPENDENT_AMBULATORY_CARE_PROVIDER_SITE_OTHER)

## 2023-12-23 ENCOUNTER — Other Ambulatory Visit: Payer: Self-pay

## 2023-12-23 DIAGNOSIS — F1020 Alcohol dependence, uncomplicated: Secondary | ICD-10-CM | POA: Diagnosis not present

## 2023-12-23 DIAGNOSIS — F411 Generalized anxiety disorder: Secondary | ICD-10-CM

## 2023-12-23 NOTE — Progress Notes (Addendum)
 THERAPIST PROGRESS NOTE  Session Time: 1:02 pm to 1:57 pm  Type of Therapy: Individual   Therapist Response/Interventions: Motivational Interviewing/Psycho-education on addiction as a brain disease, Therapist discusses 12 step meetings as a support system and explains how to access them online or in person, discuss the importance of changing people, places and things associated with use.  Treatment Goals addressed:      Problem: Substance Use     Dates: Start:  12/23/23       Disciplines: Interdisciplinary, PROVIDER        Goal: Melisssa will decrease her anxiety symptoms by reporting no higher than a "4" on the GAD-7.     Dates: Start:  12/23/23    Expected End:  06/23/24       Disciplines: Interdisciplinary, PROVIDER         Outcomes     Date/Time User Outcome    12/23/23 1346 Clydie Braun R Initial                  Goal: Mozel will abstain from alcohol for 30/30 days per month based on self report and or breathalyzer if indicated.     Dates: Start:  12/23/23    Expected End:  06/23/24       Disciplines: Interdisciplinary, PROVIDER         Outcomes     Date/Time User Outcome    12/23/23 1346 Servando Snare Initial                  Intervention: Therapist will educate Zayneb about SUDS patterns and consequences of use, relapse risk, the treatment process and types of mutual groups and provide early recovery and relapse prevention skills.     Dates: Start:  12/23/23                Intervention: Therapist will assist Nysha in identifying thoughts and behaviors that can contribute to feelings of anxiety.     Dates: Start:  12/23/23               Summary: Kadesia presents today for her first therapy session. She discusses that her G.I. MD said she needs to quit drinking for a month.  Therapist inquires if she plans to resume drinking after one month. She says she would like to have "a" drink but does not want to drink too much as she has been told it is has been affecting her health.  She says her MD told her she has a fatty liver and migraine headaches. Cintia says she is having cravings for alcohol and is not sure how to stop them. We discuss learning coping mechanisms, as well as addressing this with her psychiatrist for medications for alcohol craving. Orine says there is some history of addiction on her maternal side of the family. She says that two maternal uncles were alcoholics and it had affected their lives to the point, they were living on the streets. Amarea agrees to substitute having a soda while cooking, rather than drinking, as Genni says she associates cooking with using alcohol.  Progress Towards Goals: Initial  Suicidal/Homicidal: denies  Plan: Return again on 01-08-24 at 2pm  Diagnosis: Alcohol Use Disorder, Moderate R/O Severe GAD  Collaboration of Care: Message to upcoming Psychiatrist informing her of pt's desires   Patient/Guardian was advised Release of Information must be obtained prior to any record release in order to collaborate their care with an outside provider. Patient/Guardian was advised if they have not already done  so to contact the registration department to sign all necessary forms in order for [place] to release information regarding their care.   Consent: Patient/Guardian gives verbal consent for treatment and assignment of benefits for services provided during this visit. Patient/Guardian expressed understanding and agreed to proceed.   Remigio Eisenmenger, LMFT, LCAS 12-23-23
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
## 2023-12-25 ENCOUNTER — Other Ambulatory Visit: Payer: Self-pay

## 2023-12-25 ENCOUNTER — Encounter (HOSPITAL_COMMUNITY): Payer: Self-pay | Admitting: Student

## 2023-12-25 ENCOUNTER — Ambulatory Visit (INDEPENDENT_AMBULATORY_CARE_PROVIDER_SITE_OTHER): Admitting: Student

## 2023-12-25 DIAGNOSIS — F439 Reaction to severe stress, unspecified: Secondary | ICD-10-CM | POA: Diagnosis not present

## 2023-12-25 DIAGNOSIS — F1020 Alcohol dependence, uncomplicated: Secondary | ICD-10-CM | POA: Diagnosis not present

## 2023-12-25 DIAGNOSIS — F411 Generalized anxiety disorder: Secondary | ICD-10-CM | POA: Diagnosis not present

## 2023-12-25 DIAGNOSIS — L509 Urticaria, unspecified: Secondary | ICD-10-CM | POA: Insufficient documentation

## 2023-12-25 MED ORDER — NALTREXONE HCL 50 MG PO TABS
ORAL_TABLET | ORAL | 0 refills | Status: AC
Start: 2023-12-25 — End: 2024-03-01
  Filled 2023-12-25: qty 30, 33d supply, fill #0
  Filled 2024-01-20: qty 30, 30d supply, fill #1

## 2023-12-25 MED ORDER — FLUOXETINE HCL 10 MG PO CAPS
ORAL_CAPSULE | ORAL | 0 refills | Status: DC
  Filled 2023-12-25: qty 120, 63d supply, fill #0

## 2023-12-26 ENCOUNTER — Other Ambulatory Visit

## 2023-12-26 ENCOUNTER — Telehealth (HOSPITAL_COMMUNITY): Payer: Self-pay | Admitting: Student

## 2023-12-26 NOTE — Addendum Note (Signed)
 Addended by: Theodoro Kos A on: 12/26/2023 01:48 PM   Modules accepted: Level of Service

## 2023-12-29 NOTE — Telephone Encounter (Signed)
 Called about ordering labs at another location, answered her questions

## 2024-01-01 NOTE — Telephone Encounter (Signed)
 Manreet Kiernan @ [phone]   Unable to get in contact, left HIPAA compliant voicemail   Future Appointments  Date Time Provider Department Center  01/08/2024  2:00 PM Remigio Eisenmenger, Darcey Nora [facility] None  01/12/2024  1:30 PM Drema Dallas, DO [facility] None  01/19/2024  9:30 AM GCBH-PSY ASSOC NURSE [facility] None  02/03/2024  8:00 AM Princess Bruins, DO [facility] None  04/22/2024  4:15 PM Terri Piedra, DO [facility] None

## 2024-01-05 ENCOUNTER — Telehealth (HOSPITAL_COMMUNITY): Payer: Self-pay | Admitting: Student

## 2024-01-05 NOTE — Telephone Encounter (Signed)
 Rhonda Graves @ [phone] - able to get in contact  Messaged me that she feels sad all the time, also tired and low motivation and lack of interest since last encounter. She is worried that this is due to prozac and was wondering if she needs a new rx or a higher dose.  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  Denied active and passive SI, HI, AVH, paranoia    Assessment:  # GAD wo panic attacks # SIDD Past medication trials: zoloft, wellbutrin, Ativan, BuSpar Status of problem: ongoing Initially only anxious sxs but about a week after previous encounter, started to feel depressed--anhedonia, fatigue, lack of motivation, depressed mood. I suspect this is from decreasing EtOH intake rather than side effects of prozac, since she's been on it not very long. The fatigue may be a side effect of prozac or due to prozac improving her anxiety. She has only been on 20 mg dose of prozac for 3-4d, so I don't expect improvement yet. So will have her continue ssri per below. No med changes at this time Interventions: Therapist: Earnie Gola, LCAS Labs: VitD, B12/folate - ordered 12/25/2023 Other labs per other providers unremarkable - resulted 09/2022 Continued home prozac 20 mg qAM (i4/06/2024)  # AUD (no sz/DT) Past medication trials: NA  Status of problem: improving Cut back to max 3x drinks (wine) a day.  AST/ALT wnl 10/17/2023, seeing GI for IBS not liver damage.  Action Interventions: Encouraged cessation Continued home naltrexone 50 mg qPM  (i4/06/2024) Continued home B1 supplements daily  Future Appointments  Date Time Provider Department Center  01/08/2024  2:00 PM Earnie Gola, LCAS [facility] None  01/12/2024  1:30 PM Merriam Abbey, DO [facility]  None  01/19/2024  9:30 AM [facility] N[facility] None  02/03/2024  8:00 AM Georges Kings, DO [facility] None  04/22/2024  4:15 PM Dellar Fenton, DO [facility] None

## 2024-01-06 ENCOUNTER — Other Ambulatory Visit: Payer: Self-pay

## 2024-01-08 ENCOUNTER — Ambulatory Visit (INDEPENDENT_AMBULATORY_CARE_PROVIDER_SITE_OTHER)

## 2024-01-08 DIAGNOSIS — F1020 Alcohol dependence, uncomplicated: Secondary | ICD-10-CM

## 2024-01-08 DIAGNOSIS — F439 Reaction to severe stress, unspecified: Secondary | ICD-10-CM

## 2024-01-08 DIAGNOSIS — F411 Generalized anxiety disorder: Secondary | ICD-10-CM

## 2024-01-08 NOTE — Progress Notes (Signed)
 THERAPIST PROGRESS NOTE  Session Time: 1:58 pm to pm  Type of Therapy: Individual   Therapist Response/Interventions: psycho-education on addiction as a brain disease, presented matrix model's internal triggers and asked pt to identify which emotions were triggers for her, therapist provided an overall view of the treatment process regarding recovery and encourages Tyia to realize it is a process.  Treatment Goals addressed:      Problem: Substance Use     Dates: Start:  12/23/23       Disciplines: Interdisciplinary, PROVIDER        Goal: Jackqueline will decrease her anxiety symptoms by reporting no higher than a "4" on the GAD-7.     Dates: Start:  12/23/23    Expected End:  06/23/24       Disciplines: Interdisciplinary, PROVIDER         Outcomes     Date/Time User Outcome    12/23/23 1346 Clydie Braun R Initial                  Goal: Maxwell will abstain from alcohol for 30/30 days per month based on self report and or breathalyzer if indicated.     Dates: Start:  12/23/23    Expected End:  06/23/24       Disciplines: Interdisciplinary, PROVIDER         Outcomes     Date/Time User Outcome    12/23/23 1346 Servando Snare Initial                  Intervention: Therapist will educate Oaklyn about SUDS patterns and consequences of use, relapse risk, the treatment process and types of mutual groups and provide early recovery and relapse prevention skills.     Dates: Start:  12/23/23                Intervention: Therapist will assist Adaysha in identifying thoughts and behaviors that can contribute to feelings of anxiety.     Dates: Start:  12/23/23               Summary: Shavonna presents today for in person. She says she did not previously think she had a problem with alcohol but realizes she does not have control.  She discusses how she had a reaction to Prozac where she felt exhausted when taking it, so she stopped. She asked what will help her to stop drinking.  Therapist introduces  "internal triggers" and asks Dempsey to identify which emotions are triggers for her. She discusses how all "negative" emotions are triggers. Sherrye requested therapist explain the entire treatment progress for recovery.  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 Patient/Guardian was advised if they have not already done so to contact the registration department to sign all necessary forms in order for [place] to release information regarding their care.   Consent: Patient/Guardian gives verbal consent for treatment and assignment of benefits for services provided during this visit. Patient/Guardian expressed understanding and agreed to proceed.   Remigio Eisenmenger, MS, LMFT, LCAS

## 2024-01-09 ENCOUNTER — Other Ambulatory Visit: Payer: Self-pay

## 2024-01-11 NOTE — Progress Notes (Signed)
 NEUROLOGY FOLLOW UP OFFICE NOTE  Rhonda Graves [id number]  Assessment/Plan:   Migraine without aura, without status migrainosus, not intractable Chronic tension-type headache, not intractable  Migraine prevention:  Aimovig  140mg  every 28 days  Migraine rescue:  Ubrelvy  100mg   Limit use of pain relievers to no more than 2 days out of week to prevent risk of rebound or medication-overuse headache. Keep headache diary Follow up 6 months.    Subjective:  Rhonda Graves is a 43 year old left-handed female with IBS, depression and anxiety who follows up for headaches.  UPDATE: Intensity:  moderate to severe Duration:  less than 2 hours with Ubrelvy  (on occasion may last all day during the day or two before onset of menses) Frequency:  4  a month Current NSAIDS/analgesics:  none Current triptans:  none Current ergotamine:  none Current anti-emetic:  none Current muscle relaxants:  Flexeril  5mg  TID PRN Current Antihypertensive medications:  none Current Antidepressant medications:  none Current Anticonvulsant medications:  none Current anti-CGRP:  Aimovig  140mg , Ubrelvy  100mg  Current Vitamins/Herbal/Supplements:  magnesium  400mg  daily, CoQ10, Xyzal , melatonin Current Antihistamines/Decongestants: none Other therapy:  none    Caffeine:  1 cup coffee in morning (1/2 caff and 1/2 decaff) Diet:  1 liter water daily.  Does not skip meals.  Chocolate and cheese are not triggers Exercise:  running on treadmill, rowing twice a week Depression:  no; Anxiety:  yes.   Sleep hygiene:  Hard to shut off brain at night.  Worries.  Difficult to fall asleep  HISTORY: Onset:  teenager Location:  varies - behind eyes, temple, unilateral either side Quality:  sharp, throbbing Intensity:  Severe.  She denies new headache, thunderclap headache or severe headache that wakes her from sleep. Aura:  absent Prodrome:  absent Associated symptoms:  allodynia of scalp, photophobia, phonophobia  She  denies nausea, vomiting, visual disturbance, associated unilateral numbness or weakness. Duration:  several hours to a day (untreated), 2-3 hours (treated with sumatriptan ) Frequency:  on average once a week (may have a week without migraine and then one two consecutive days in a row) Triggers:  menses (severe migraine 2 days before her period), wine, sleep deprivation, dehydration Relieving factors:  rest Activity:  cannot function  Also has tension-type headaches almost daily.  Usually back of head or sometimes across forehead.  Mild-moderate intensity and occurs at end of the day.  Takes Tylenol  almost daily.  Past NSAIDS/analgesics:  diclofenac , ibuprofen , Tylenol ,Toradol  Past abortive triptans:  eletriptan , sumatriptan  (throat pressure) Past abortive ergotamine:  none Past muscle relaxants:  none Past anti-emetic:  none Past antihypertensive medications:  amlodipine , hydrochlorothiazide , nifedepine Past antidepressant medications:  sertraline  Past anticonvulsant medications:  topiramate Past anti-CGRP:  none Past vitamins/Herbal/Supplements:  none Past antihistamines/decongestants:  Zyrtec , Benadryl , Xyzal , Flonase  Other past therapies:  none   Family history of headache:  mom (migraines)  PAST MEDICAL HISTORY: Past Medical History:  Diagnosis Date   Abnormal Pap smear    had colpo in past, normal pap since then   AMA (advanced maternal age) multigravida 35+, second trimester 03/06/2017   Seen by [facility]. Low risk panorama. Normal NT. afp neg     Anxiety    Chronic hypertension in pregnancy 02/18/2014   [ ]  Aspirin 81 mg daily after 12 weeks; discontinue after 36 weeks  Current antihypertensives:  none     Baseline and surveillance labs (pulled in from [facility], refresh links as needed)           Lab Results  Component    Value    Date           PLT    327    02/02/2016           CREATININE    0.64    02/02/2016           AST    28    02/02/2016           ALT    39 (H)    02/02/2016            PRO   Depressive disorder    Dysmenorrhea    Eczema    GERD (gastroesophageal reflux disease) Dx 2010   Headache(784.0)    migraine   History of cesarean delivery 03/06/2017   Desires TOLAC, consent signed 06/09/2017        History of oral herpes simplex infection 05/29/2017   +IgM 1/2 antibody screen done for cold sores vs aphthous ulcers   IBS (irritable bowel syndrome)    Migraine    Multiple allergies    Polyhydramnios affecting pregnancy in third trimester 11/25/2013   [ ]  consider 39wk IOL vs EDC IOL  Had G1 IOL due to poly  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     Status post repeat low transverse cesarean section 08/20/2017   Supervision of high risk pregnancy, antepartum 01/06/2017            Clinic     [facility]    Prenatal Labs      Dating     LMP (IUI pregnancy)    Blood type: A/Positive/-- (04/16 1631) A pos      Genetic Screen    1 Screen: Neg   AFP:     Neg   Cffdna: low risk    Antibody:Negative (04/16 1631)neg      Anatomic US      Normal    Rubella: 4.57 (04/16 1631)imm      GTT    Third trimester: neg    RPR: Non Reactive (09/06 0836) neg      Flu vaccine     05/29/2017     Urticaria     MEDICATIONS: Current Outpatient Medications on File Prior to Visit  Medication Sig Dispense Refill   Acetaminophen  (TYLENOL  PO) Take 500 mg by mouth as needed.     cyclobenzaprine  (FLEXERIL ) 5 MG tablet Take 1 tablet (5 mg total) by mouth 3 (three) times daily as needed for muscle spasms. 60 tablet 3   diclofenac  Sodium (VOLTAREN ) 1 % GEL Apply 2 g topically 4 (four) times daily. 200 g 1   EPINEPHrine  0.3 mg/0.3 mL IJ SOAJ injection Inject 0.3 mg into the muscle as needed. 2 each 1   Erenumab -aooe (AIMOVIG ) 140 MG/ML SOAJ Inject 140 mg into the skin every 28 (twenty-eight) days. 1 mL 11   FLUoxetine  (PROZAC ) 10 MG capsule Take 1 capsule (10 mg total) by mouth every morning for 7 days, THEN 2 capsules (20 mg total) every morning. 120 capsule 0   Ivermectin  (SOOLANTRA ) 1 % CREA Apply 1  application  topically in the morning and at bedtime. 45 g 4   levocetirizine (XYZAL ) 5 MG tablet Take 1 tablet by mouth 1 time daily. May take an additional tablet as needed for itching/hives flare. 30 tablet 5   Melatonin 10 MG TABS Take 1 tablet by mouth daily. 90 tablet 1   naltrexone  (DEPADE) 50 MG tablet Take 0.5 tablets (25 mg total) by mouth at bedtime  for 7 days, THEN 1 tablet (50 mg total) at bedtime. 64 tablet 0   Ubrogepant  (UBRELVY ) 100 MG TABS Take 1 tablet (100 mg total) by mouth as needed. May repeat after 2 hours.  Maximum 2 tablets in 24 hours. 16 tablet 5   No current facility-administered medications on file prior to visit.    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 Uncertain what caused reaction.   Lomotil  [Diphenoxylate ] Itching and Swelling    Had allergic reaction after Lomotil , Lysteda  and Hemabate . Uncertain what caused reaction.   Lysteda  [Tranexamic Acid ] Itching and Swelling    Had allergic reaction after Lomotil , Lysteda  and Hemabate . Uncertain what caused reaction.    FAMILY HISTORY: Family History  Problem Relation Age of Onset   Migraines Mother    Hypothyroidism Mother    Arthritis Mother    Allergic rhinitis Father    Asthma Brother    Allergic rhinitis Brother    Alzheimer's disease Maternal Grandmother    Colon cancer Paternal Grandmother    Breast cancer Neg Hx       Objective:  Blood pressure 136/84, pulse 72, height 5\' 1"  (1.549 m), weight 146 lb 9.6 oz (66.5 kg), SpO2 99%. General: No acute distress.  Patient appears well-groomed.     Janne Members, DO  CC: Winda Hastings, NP

## 2024-01-12 ENCOUNTER — Other Ambulatory Visit: Payer: Self-pay

## 2024-01-12 ENCOUNTER — Ambulatory Visit (HOSPITAL_COMMUNITY): Payer: Medicaid Other | Admitting: Mental Health

## 2024-01-12 ENCOUNTER — Encounter: Payer: Self-pay | Admitting: Neurology

## 2024-01-12 ENCOUNTER — Other Ambulatory Visit (HOSPITAL_COMMUNITY): Payer: Self-pay

## 2024-01-12 ENCOUNTER — Ambulatory Visit: Payer: Medicaid Other | Admitting: Neurology

## 2024-01-12 VITALS — BP 136/84 | HR 72 | Ht 61.0 in | Wt 146.6 lb

## 2024-01-12 DIAGNOSIS — F1020 Alcohol dependence, uncomplicated: Secondary | ICD-10-CM | POA: Diagnosis not present

## 2024-01-12 DIAGNOSIS — G43009 Migraine without aura, not intractable, without status migrainosus: Secondary | ICD-10-CM

## 2024-01-12 DIAGNOSIS — F411 Generalized anxiety disorder: Secondary | ICD-10-CM | POA: Diagnosis not present

## 2024-01-12 DIAGNOSIS — F439 Reaction to severe stress, unspecified: Secondary | ICD-10-CM | POA: Diagnosis not present

## 2024-01-12 MED ORDER — UBRELVY 100 MG PO TABS
1.0000 | ORAL_TABLET | ORAL | 5 refills | Status: AC | PRN
  Filled 2024-02-03: qty 16, 8d supply, fill #0
  Filled 2024-04-27: qty 16, 8d supply, fill #1
  Filled 2024-07-06: qty 10, 30d supply, fill #2
  Filled 2024-07-27: qty 10, 30d supply, fill #3
  Filled 2024-07-28 – 2024-07-29 (×2): qty 16, 30d supply, fill #3
  Filled 2024-09-07: qty 16, 30d supply, fill #4

## 2024-01-13 ENCOUNTER — Telehealth (HOSPITAL_COMMUNITY): Payer: Self-pay | Admitting: Student

## 2024-01-13 NOTE — Telephone Encounter (Signed)
 Rhonda Graves @ [phone] - able to get in contact  She stopped prozac  4/14 due to sedating side effects, and since then has noted improvement in sleepiness during the day. Noted that her anxiety and sleep has improved some, which is also contributed by her cutting back on drinking.  She is still having significant cravings however and inquired about baclofen which is what she heard from her therapist. Shelah Derry through possible medication options before baclofen would be considered such as increasing naltrexone , campral.   Future Appointments  Date Time Provider Department Center  01/19/2024  9:30 AM [facility] None  02/03/2024  8:00 AM Georges Kings, DO [facility] None  04/22/2024  4:15 PM Dellar Fenton, DO [facility] None  07/23/2024  1:30 PM Merriam Abbey, DO [facility] None    # GAD wo panic attacks Past medication trials: zoloft , wellbutrin, Ativan , BuSpar, prozac  Status of problem: improving Stopped prozac  ~10days after starting it due to sedating side effects to the point where she felt like she couldn't function. She has noticed improvement in mood and sleep, but she has also been cutting back on drinking. She is hesitant to try another anti-anxiety rx. Will discuss with her during our next appointment. Interventions: Therapist: Earnie Gola, LCAS Labs: VitD, B12/folate - ordered 12/25/2023 Other labs per other providers unremarkable - resulted 09/2022 DC prozac  20 mg qAM (s4/11/2023) - dc 01/05/2024

## 2024-01-19 ENCOUNTER — Other Ambulatory Visit (HOSPITAL_COMMUNITY)

## 2024-01-19 ENCOUNTER — Telehealth (HOSPITAL_COMMUNITY): Payer: Self-pay

## 2024-01-19 ENCOUNTER — Other Ambulatory Visit: Payer: Self-pay | Admitting: Neurology

## 2024-01-19 DIAGNOSIS — F411 Generalized anxiety disorder: Secondary | ICD-10-CM

## 2024-01-19 DIAGNOSIS — F1020 Alcohol dependence, uncomplicated: Secondary | ICD-10-CM | POA: Diagnosis not present

## 2024-01-19 DIAGNOSIS — Z79899 Other long term (current) drug therapy: Secondary | ICD-10-CM | POA: Diagnosis not present

## 2024-01-19 DIAGNOSIS — F439 Reaction to severe stress, unspecified: Secondary | ICD-10-CM | POA: Diagnosis not present

## 2024-01-19 NOTE — Telephone Encounter (Signed)
 PT came in requesting a refille of the medication naltrexone  (DEPADE) 50 MG tablet - PT states she only had one week left of medication    Sending this over to provider for review.    JNL, CMA

## 2024-01-19 NOTE — Addendum Note (Signed)
 Addended by: Izadora Roehr on: 01/19/2024 09:50 AM   Modules accepted: Orders

## 2024-01-19 NOTE — Addendum Note (Signed)
 Addended by: Nelle Sayed, JA'BRON N on: 01/19/2024 12:26 PM   Modules accepted: Orders

## 2024-01-19 NOTE — Progress Notes (Signed)
 Patient tolerated labs well and will follow up with ordering provider

## 2024-01-20 ENCOUNTER — Other Ambulatory Visit: Payer: Self-pay

## 2024-01-22 ENCOUNTER — Other Ambulatory Visit: Payer: Self-pay

## 2024-01-22 MED ORDER — NALTREXONE HCL 50 MG PO TABS
50.0000 mg | ORAL_TABLET | Freq: Every day | ORAL | 0 refills | Status: DC
  Filled 2024-01-22 – 2024-02-03 (×4): qty 60, 60d supply, fill #0

## 2024-01-22 NOTE — Addendum Note (Signed)
 Addended by: Ngan Qualls on: 01/22/2024 12:12 PM   Modules accepted: Orders

## 2024-01-22 NOTE — Telephone Encounter (Signed)
 Recent Visits Date Type Provider Dept  12/25/23 Office Visit Terecia Plaut, DO [facility]  Showing recent visits within past 90 days and meeting all other requirements Future Appointments Date Type Provider Dept  02/03/24 Appointment Sequoia Mincey, DO [facility]  Showing future appointments within next 90 days and meeting all other requirements  Refilled patient's rx per request to bridge until next appointment: Rhonda Graves was seen today for medication management.  Alcohol use disorder, moderate, dependence (HCC) -     Naltrexone  HCl; Take 1 tablet (50 mg total) by mouth at bedtime.  Dispense: 60 tablet; Refill: 0  Generalized anxiety disorder -     Naltrexone  HCl; Take 1 tablet (50 mg total) by mouth at bedtime.  Dispense: 60 tablet; Refill: 0     Pharmacy sent to: [facility] [address], [address] [place] [address] Phone: [phone] Fax: [phone]

## 2024-01-23 ENCOUNTER — Other Ambulatory Visit: Payer: Self-pay

## 2024-01-24 LAB — VITAMIN D 25 HYDROXY (VIT D DEFICIENCY, FRACTURES): Vit D, 25-Hydroxy: 26.2 ng/mL — ABNORMAL LOW (ref 30.0–100.0)

## 2024-01-24 LAB — SPECIMEN STATUS REPORT

## 2024-01-26 ENCOUNTER — Other Ambulatory Visit: Payer: Self-pay

## 2024-01-29 NOTE — Progress Notes (Signed)
 Yes, I did order this

## 2024-02-03 ENCOUNTER — Other Ambulatory Visit: Payer: Self-pay

## 2024-02-03 ENCOUNTER — Encounter (HOSPITAL_COMMUNITY): Payer: Self-pay | Admitting: Student

## 2024-02-03 ENCOUNTER — Telehealth (HOSPITAL_COMMUNITY): Admitting: Student

## 2024-02-03 DIAGNOSIS — F411 Generalized anxiety disorder: Secondary | ICD-10-CM | POA: Diagnosis not present

## 2024-02-03 DIAGNOSIS — F10982 Alcohol use, unspecified with alcohol-induced sleep disorder: Secondary | ICD-10-CM

## 2024-02-03 DIAGNOSIS — F1020 Alcohol dependence, uncomplicated: Secondary | ICD-10-CM

## 2024-02-03 MED ORDER — GABAPENTIN 100 MG PO CAPS
100.0000 mg | ORAL_CAPSULE | Freq: Two times a day (BID) | ORAL | 0 refills | Status: DC | PRN
  Filled 2024-02-03: qty 120, 60d supply, fill #0

## 2024-02-03 MED ORDER — NALTREXONE HCL 50 MG PO TABS
100.0000 mg | ORAL_TABLET | Freq: Every day | ORAL | 0 refills | Status: DC
  Filled 2024-02-03 – 2024-02-07 (×3): qty 120, 60d supply, fill #0
  Filled 2024-02-10 – 2024-02-12 (×2): qty 60, 30d supply, fill #0
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
## 2024-02-05 ENCOUNTER — Other Ambulatory Visit: Payer: Self-pay

## 2024-02-05 NOTE — Addendum Note (Signed)
 Addended by: Donnelly Gainer on: 02/05/2024 11:06 AM   Modules accepted: Level of Service

## 2024-02-07 ENCOUNTER — Other Ambulatory Visit: Payer: Self-pay

## 2024-02-10 ENCOUNTER — Other Ambulatory Visit: Payer: Self-pay

## 2024-02-12 ENCOUNTER — Other Ambulatory Visit: Payer: Self-pay

## 2024-02-12 ENCOUNTER — Ambulatory Visit (HOSPITAL_COMMUNITY)

## 2024-02-13 ENCOUNTER — Ambulatory Visit: Attending: Vascular Surgery

## 2024-02-13 DIAGNOSIS — I8393 Asymptomatic varicose veins of bilateral lower extremities: Secondary | ICD-10-CM

## 2024-02-13 NOTE — Progress Notes (Signed)
 Treated pt's small reticular and spider veins of BLE with Asclera 1% administered with a 27g butterfly.  Patient received a total of 2 mL/20mg  of Asclera 1%. Pt tolerated well; easy access. She is having some intermittent discomfort in her RLE where several stabs were done. She was given post treatment care instructions on handout and verbally and will call if she has any questions/concerns.     Photos: Yes.    Compression stockings applied: Yes.

## 2024-02-18 ENCOUNTER — Other Ambulatory Visit: Payer: Self-pay

## 2024-03-02 ENCOUNTER — Other Ambulatory Visit: Payer: Self-pay

## 2024-03-04 ENCOUNTER — Other Ambulatory Visit: Payer: Self-pay

## 2024-03-04 ENCOUNTER — Telehealth (INDEPENDENT_AMBULATORY_CARE_PROVIDER_SITE_OTHER): Admitting: Student

## 2024-03-04 DIAGNOSIS — F411 Generalized anxiety disorder: Secondary | ICD-10-CM

## 2024-03-04 DIAGNOSIS — F1020 Alcohol dependence, uncomplicated: Secondary | ICD-10-CM

## 2024-03-04 DIAGNOSIS — F10982 Alcohol use, unspecified with alcohol-induced sleep disorder: Secondary | ICD-10-CM
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
## 2024-03-05 ENCOUNTER — Other Ambulatory Visit: Payer: Self-pay

## 2024-03-05 ENCOUNTER — Encounter (HOSPITAL_COMMUNITY): Payer: Self-pay | Admitting: Student

## 2024-03-05 MED ORDER — GABAPENTIN 300 MG PO CAPS
300.0000 mg | ORAL_CAPSULE | Freq: Two times a day (BID) | ORAL | 0 refills | Status: DC | PRN
  Filled 2024-03-05: qty 120, 60d supply, fill #0

## 2024-03-05 MED ORDER — NALTREXONE HCL 50 MG PO TABS
100.0000 mg | ORAL_TABLET | Freq: Every day | ORAL | 0 refills | Status: DC
  Filled 2024-03-05: qty 120, 60d supply, fill #0
  Filled 2024-03-08: qty 60, 30d supply, fill #0
  Filled 2024-04-12: qty 60, 30d supply, fill #1

## 2024-03-08 ENCOUNTER — Other Ambulatory Visit: Payer: Self-pay

## 2024-03-09 ENCOUNTER — Ambulatory Visit (INDEPENDENT_AMBULATORY_CARE_PROVIDER_SITE_OTHER)

## 2024-03-09 DIAGNOSIS — F411 Generalized anxiety disorder: Secondary | ICD-10-CM

## 2024-03-09 DIAGNOSIS — F1020 Alcohol dependence, uncomplicated: Secondary | ICD-10-CM | POA: Diagnosis not present

## 2024-03-09 DIAGNOSIS — F10982 Alcohol use, unspecified with alcohol-induced sleep disorder: Secondary | ICD-10-CM

## 2024-03-09 NOTE — Progress Notes (Addendum)
 THERAPIST PROGRESS NOTE  Session Time: 1:59 to 2.55pm  Virtual Visit via Video Note   I connected with  Rhonda Graves at  1:59 pm EST by a video enabled telemedicine application and verified that I am speaking with the correct person using two identifiers.   Location: Patient: home Provider: [address] [place]   I discussed the limitations of evaluation and management by telemedicine and the availability of in person appointments. The patient expressed understanding and agreed to proceed.   Type of Therapy: Individual   Therapist Response/Interventions: psycho-education on addiction as a brain disease, discussed external triggers  Treatment Goals addressed:      Problem: Substance Use     Dates: Start:  12/23/23       Disciplines: Interdisciplinary, PROVIDER        Goal: Rhonda Graves will decrease her anxiety symptoms by reporting no higher than a 4 on the GAD-7.     Dates: Start:  12/23/23    Expected End:  06/23/24       Disciplines: Interdisciplinary, PROVIDER         Outcomes     Date/Time User Outcome    12/23/23 1346 Mariah Shines R Initial                  Goal: Rhonda Graves will abstain from alcohol for 30/30 days per month based on self report and or breathalyzer if indicated.     Dates: Start:  12/23/23    Expected End:  06/23/24       Disciplines: Interdisciplinary, PROVIDER         Outcomes     Date/Time User Outcome    12/23/23 1346 Rhonda Graves Initial                  Intervention: Therapist will educate Rhonda Graves about SUDS patterns and consequences of use, relapse risk, the treatment process and types of mutual groups and provide early recovery and relapse prevention skills.     Dates: Start:  12/23/23                Intervention: Therapist will assist Rhonda Graves in identifying thoughts and behaviors that can contribute to feelings of anxiety.     Dates: Start:  12/23/23               Summary: Rhonda Graves presents virtually and reports Rhonda Graves has been able to drink one glass  of wine two to three times per week.  Rhonda Graves says Rhonda Graves wants to work toward just drinking one glass of wine for two days per week and then one time per week. Rhonda Graves says what Rhonda Graves is replacing the alcohol is drinking coke zero and water with lemon.  Rhonda Graves says that sometimes Rhonda Graves will take a sip of wine and the Rhonda Graves can switch to coke zero. Rhonda Graves says Rhonda Graves has had liver issues and this is what motivated her to reduce her intake of alcohol.   Rhonda Graves identifies the following external triggers: being with friends who drink, parties, after going by the store when Rhonda Graves purchases her wine, work.  We discussed coming up with a plan on how to deal with triggers both internal and external.  Rhonda Graves agrees to begin thinking of things Rhonda Graves can do to deal with triggers for the next session.  Therapist will introduce her to coping skills.    Progress Towards Goals: progressing  Suicidal/Homicidal: denies  Plan: Return again virtually on 04-20-24 at 3pm  Diagnosis: Alcohol Use Disorder, Moderate R/O Severe GAD  Collaboration of Care: N/A  Patient/Guardian was advised Release of Information must be obtained prior to any record release in order to collaborate their care with an outside provider. Patient/Guardian was advised if they have not already done so to contact the registration department to sign all necessary forms in order for us  to release information regarding their care.   Consent: Patient/Guardian gives verbal consent for treatment and assignment of benefits for services provided during this visit. Patient/Guardian expressed understanding and agreed to proceed.   Earnie Gola, MS, LMFT, LCAS

## 2024-03-11 ENCOUNTER — Other Ambulatory Visit: Payer: Self-pay

## 2024-03-31 ENCOUNTER — Other Ambulatory Visit: Payer: Self-pay

## 2024-03-31 ENCOUNTER — Other Ambulatory Visit: Payer: Self-pay | Admitting: Allergy

## 2024-03-31 MED ORDER — LEVOCETIRIZINE DIHYDROCHLORIDE 5 MG PO TABS
5.0000 mg | ORAL_TABLET | Freq: Every day | ORAL | 5 refills | Status: DC
  Filled 2024-03-31: qty 30, 15d supply, fill #0
  Filled 2024-04-12: qty 30, 15d supply, fill #1
  Filled 2024-04-27: qty 30, 15d supply, fill #2
  Filled 2024-05-23: qty 30, 15d supply, fill #3
  Filled 2024-06-21: qty 30, 15d supply, fill #4
  Filled 2024-07-22: qty 30, 15d supply, fill #5

## 2024-04-01 ENCOUNTER — Other Ambulatory Visit: Payer: Self-pay

## 2024-04-02 ENCOUNTER — Other Ambulatory Visit: Payer: Self-pay

## 2024-04-09 ENCOUNTER — Other Ambulatory Visit: Payer: Self-pay

## 2024-04-12 ENCOUNTER — Other Ambulatory Visit: Payer: Self-pay

## 2024-04-20 ENCOUNTER — Ambulatory Visit (HOSPITAL_COMMUNITY)
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
## 2024-04-22 ENCOUNTER — Ambulatory Visit: Payer: Medicaid Other | Admitting: Dermatology

## 2024-04-23 ENCOUNTER — Ambulatory Visit

## 2024-04-23 ENCOUNTER — Other Ambulatory Visit: Payer: Self-pay

## 2024-04-23 ENCOUNTER — Encounter: Payer: Self-pay | Admitting: Podiatry

## 2024-04-23 ENCOUNTER — Ambulatory Visit: Admitting: Podiatry

## 2024-04-23 DIAGNOSIS — S90112A Contusion of left great toe without damage to nail, initial encounter: Secondary | ICD-10-CM

## 2024-04-23 DIAGNOSIS — S93509A Unspecified sprain of unspecified toe(s), initial encounter: Secondary | ICD-10-CM | POA: Diagnosis not present

## 2024-04-23 MED ORDER — MELOXICAM 15 MG PO TABS
15.0000 mg | ORAL_TABLET | Freq: Every day | ORAL | 0 refills | Status: DC
  Filled 2024-04-23: qty 21, 21d supply, fill #0

## 2024-04-23 NOTE — Progress Notes (Signed)
 Chief Complaint  Patient presents with   Toe Injury    HPI: 43 y.o. female presents today with left first toe injury.  She [place] first toe was jammed and forced into excessive plantarflexion.  Reports most of her pain at the interphalangeal joint.  She reports pain when trying to flex the toe down.  Past Medical History:  Diagnosis Date   Abnormal Pap smear    had colpo in past, normal pap since then   AMA (advanced maternal age) multigravida 35+, second trimester 03/06/2017   Seen by [facility]. Low risk panorama. Normal NT. afp neg     Anxiety    Chronic hypertension in pregnancy 02/18/2014   [ ]  Aspirin 81 mg daily after 12 weeks; discontinue after 36 weeks  Current antihypertensives:  none     Baseline and surveillance labs (pulled in from [facility], refresh links as needed)           Lab Results      Component    Value    Date           PLT    327    02/02/2016           CREATININE    0.64    02/02/2016           AST    28    02/02/2016           ALT    39 (H)    02/02/2016           PRO   Depressive disorder    Dysmenorrhea    Eczema    GERD (gastroesophageal reflux disease) Dx 2010   Headache(784.0)    migraine   History of cesarean delivery 03/06/2017   Desires TOLAC, consent signed 06/09/2017        History of oral herpes simplex infection 05/29/2017   +IgM 1/2 antibody screen done for cold sores vs aphthous ulcers   IBS (irritable bowel syndrome)    Migraine    Multiple allergies    Polyhydramnios affecting pregnancy in third trimester 11/25/2013   [ ]  consider 39wk IOL vs EDC IOL  Had G1 IOL due to poly  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     Status post repeat low transverse cesarean section 08/20/2017   Supervision of high risk pregnancy, antepartum 01/06/2017            Clinic     [facility]    Prenatal Labs      Dating     LMP (IUI pregnancy)    Blood type: A/Positive/-- (04/16 1631) A pos      Genetic Screen    1 Screen:  Neg   AFP:     Neg   Cffdna: low risk    Antibody:Negative (04/16 1631)neg      Anatomic US      Normal    Rubella: 4.57 (04/16 1631)imm      GTT    Third trimester: neg    RPR: Non Reactive (09/06 0836) neg      Flu vaccine     05/29/2017     Urticaria     Past Surgical History:  Procedure Laterality Date   CESAREAN SECTION N/A 11/26/2013   Procedure: CESAREAN SECTION;  Surgeon: Elveria Mungo, MD;  Location: [facility];  Service: Obstetrics;  Laterality: N/A;   CESAREAN SECTION N/A 08/17/2017  Procedure: CESAREAN SECTION;  Surgeon: Edsel Norleen GAILS, MD;  Location: [facility];  Service: Obstetrics;  Laterality: N/A;   COLONOSCOPY  07/11/2023   ESOPHAGOGASTRODUODENOSCOPY  05/23/2011   Mild gastritis. 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 Uncertain what caused reaction.   Lomotil  [Diphenoxylate ] Itching and Swelling    Had allergic reaction after Lomotil , Lysteda  and Hemabate . Uncertain what caused reaction.   Lysteda  [Tranexamic Acid ] Itching and Swelling    Had allergic reaction after Lomotil , Lysteda  and Hemabate . Uncertain what caused reaction.    ROS    Physical Exam: There were no vitals filed for this visit.  General: The patient is alert and oriented x3 in no acute distress.  Dermatology: Skin is warm, dry and supple bilateral lower extremities.  Small scabbed over abrasion dorsal left first toe without signs of infection  Vascular: Palpable pedal pulses bilaterally. Capillary refill within normal limits. No erythema or calor.  Neurological: Light touch sensation grossly intact bilateral feet.   Musculoskeletal Exam: Muscle  strength 5/5 for all major muscle groups.  No gross orthopedic deformities.  There is tenderness on palpation of left first toe specially at interphalangeal joint.  Some pain with passive plantarflexion of the left first toe interphalangeal joint level.  Less pain with dorsiflexion.  Radiographic Exam: Left foot 3 views weightbearing 04/23/2024 Normal osseous mineralization. Joint spaces preserved.  No fractures or osseous irregularities noted.  Assessment/Plan of Care: 1. Toe sprain, initial encounter      No orders of the defined types were placed in this encounter.  DG FOOT COMPLETE LEFT  Discussed clinical findings with patient today.  Radiographs reviewed with patient.  Negative for acute fracture.  Suspect sprain of the interphalangeal joint.  Does have intact muscle strength.  Recommend protected weightbearing in rigid flat bottom surgical shoe.  This was fitted and dispensed to the patient today.  Course of oral meloxicam  sent to patient's pharmacy.  15 mg once a day for 3 weeks.  May take over-the-counter Tylenol  as well.  RICE therapy as well.  May use compressive Coban to help with edema control.  Follow-up in 2 to 3 weeks for reevaluation to see how she is progressing   Elzina Devera L. Lamount MAUL, AACFAS [facility]     [address] [place], [place] [address]                Office [phone]  Fax [phone]

## 2024-04-27 ENCOUNTER — Other Ambulatory Visit: Payer: Self-pay | Admitting: Plastic Surgery

## 2024-04-27 ENCOUNTER — Other Ambulatory Visit: Payer: Self-pay

## 2024-04-28 ENCOUNTER — Encounter: Payer: Self-pay | Admitting: Dermatology

## 2024-04-28 ENCOUNTER — Ambulatory Visit: Admitting: Dermatology

## 2024-04-28 ENCOUNTER — Other Ambulatory Visit: Payer: Self-pay

## 2024-04-28 VITALS — BP 144/90

## 2024-04-28 DIAGNOSIS — L821 Other seborrheic keratosis: Secondary | ICD-10-CM

## 2024-04-28 DIAGNOSIS — L820 Inflamed seborrheic keratosis: Secondary | ICD-10-CM

## 2024-04-28 DIAGNOSIS — Z139 Encounter for screening, unspecified: Secondary | ICD-10-CM

## 2024-04-28 DIAGNOSIS — I781 Nevus, non-neoplastic: Secondary | ICD-10-CM

## 2024-04-28 DIAGNOSIS — L719 Rosacea, unspecified: Secondary | ICD-10-CM

## 2024-04-28 MED ORDER — DAPSONE 5 % EX GEL
1.0000 | Freq: Every day | CUTANEOUS | 2 refills | Status: AC
  Filled 2024-04-28 – 2024-04-29 (×2): qty 60, 30d supply, fill #0
  Filled 2024-05-24: qty 60, 30d supply, fill #1
  Filled 2024-06-21 – 2024-09-20 (×2): qty 60, 30d supply, fill #2

## 2024-04-28 NOTE — Patient Instructions (Addendum)
 Date: Wed Apr 28 2024  Hello Rhonda Graves,  Thank you for visiting today. Here is a summary of the key instructions:  - Lab Tests:   - Go to [facility] this week for ANA with reflex titers test   - Check MyChart next week for test results  - Medications:   - Stop using ivermectin  cream   - Use over-the-counter azelaic acid from [facility] in the morning   - Apply prescription dapsone  cream   - Use hydrocortisone for 10 days before starting the new regimen  - Skin Care:   - Use Avene face wash   - Apply Cicalfate balm at night   - Continue using SPF 100 sunscreen  - Treatment Areas:   - Seborrheic keratosis spot was frozen and will fall off in 1-2 weeks  - Follow-up:   - Return for a follow-up appointment in 3 months  - Other Instructions:   - Avoid triggers: exercise, hot showers, chocolate, wine, spicy food, tomatoes, heat, and sun   - Printed regimen instructions will be provided  Please reach out if you have any questions or concerns.  Warm regards,  Dr. Delon Lenis Dermatology           Cryotherapy Aftercare  Wash gently with soap and water everyday.   Apply Vaseline and Band-Aid daily until healed.    Important Information  Due to recent changes in healthcare laws, you may see results of your pathology and/or laboratory studies on MyChart before the doctors have had a chance to review them. We understand that in some cases there may be results that are confusing or concerning to you. Please understand that not all results are received at the same time and often the doctors may need to interpret multiple results in order to provide you with the best plan of care or course of treatment. Therefore, we ask that you please give us  2 business days to thoroughly review all your results before contacting the office for clarification. Should we see a critical lab result, you will be contacted sooner.   If You Need Anything After Your Visit  If you have any  questions or concerns for your doctor, please call our main line at [phone] If no one answers, please leave a voicemail as directed and we will return your call as soon as possible. Messages left after 4 pm will be answered the following business day.   You may also send us  a message via MyChart. We typically respond to MyChart messages within 1-2 business days.  For prescription refills, please ask your pharmacy to contact our office. Our fax number is [phone].  If you have an urgent issue when the clinic is closed that cannot wait until the next business day, you can page your doctor at the number below.    Please note that while we do our best to be available for urgent issues outside of office hours, we are not available 24/7.   If you have an urgent issue and are unable to reach us , you may choose to seek medical care at your doctor's office, retail clinic, urgent care center, or emergency room.  If you have a medical emergency, please immediately call 911 or go to the emergency department. In the event of inclement weather, please call our main line at [phone] for an update on the status of any delays or closures.  Dermatology Medication Tips: Please keep the boxes that topical medications come in in order to help  keep track of the instructions about where and how to use these. Pharmacies typically print the medication instructions only on the boxes and not directly on the medication tubes.   If your medication is too expensive, please contact our office at [phone] or send us  a message through MyChart.   We are unable to tell what your co-pay for medications will be in advance as this is different depending on your insurance coverage. However, we may be able to find a substitute medication at lower cost or fill out paperwork to get insurance to cover a needed medication.   If a prior authorization is required to get your medication covered by your insurance company,  please allow us  1-2 business days to complete this process.  Drug prices often vary depending on where the prescription is filled and some pharmacies may offer cheaper prices.  The website www.goodrx.com contains coupons for medications through different pharmacies. The prices here do not account for what the cost may be with help from insurance (it may be cheaper with your insurance), but the website can give you the price if you did not use any insurance.  - You can print the associated coupon and take it with your prescription to the pharmacy.  - You may also stop by our office during regular business hours and pick up a GoodRx coupon card.  - If you need your prescription sent electronically to a different pharmacy, notify our office through [facility] or by phone at [phone]

## 2024-04-28 NOTE — Progress Notes (Signed)
   Follow-Up Visit   Subjective  Rhonda Graves is a 43 y.o. female established patient who presents for FOLLOW UP on the diagnoses listed below:  Patient was last evaluated on 11/17/23.   Rosacea: Prescribed Ivermectin . Patient stated that she stopped using the ivermectin  2 weeks after receiving Rx due to increased redness and pimple formation. She is not currently using anything to Tx.   Spot Check: TBSE was conducted on 11/17/23. She would like mole on L flank re-evaluated again as she is concerned it may be suspicious.   The following portions of the chart were reviewed this encounter and updated as appropriate: medications, allergies, medical history  Review of Systems:  No other skin or systemic complaints except as noted in HPI or Assessment and Plan.  Objective  Well appearing patient in no apparent distress; mood and affect are within normal limits.   A focused examination was performed of the following areas: face   Relevant exam findings are noted in the Assessment and Plan.                 Assessment & Plan   ROSACEA Exam Mid face erythema with telangiectasias +/- scattered inflammatory papules  - Assessment: Patient has a history of rosacea with previous unsuccessful treatments including ivermectin  (which worsened symptoms) and metronidazole . Symptoms worsen in summer due to sun exposure, despite using SPF 100 sunscreen. Other identified triggers include exercise, hot showers, chocolate, wine, spicy food, tomatoes, and heat. Patient reports skin thickness for a few months, indicative of inflammation. Differential diagnosis includes lupus, which will be ruled out through lab work.  - Plan:    Order ANA with reflex titers at [facility] this week    Review results and communicate via MyChart message next week    Prescribe azelaic acid (OTC from [facility]) to be applied in the morning    Prescribe dapsone  for inflammation and bumps    Recommend  hydrocortisone for 10 days before starting new regimen    Provide Avene face wash and Cicalfate nighttime balm    Educate patient on trigger avoidance and proper use of new skincare regimen  2. Seborrheic Keratosis - Assessment: Patient reports a growing seborrheic keratosis lesion. - Plan:    Perform cryotherapy on seborrheic keratosis lesion    Educate patient: lesion will become crusty and fall off in 1-2 weeks  Follow-up in 3 months.  Patient Instructions  SCREENING DUE   ROSACEA   INFLAMED SEBORRHEIC KERATOSIS Left Flank Destruction of lesion - Left Flank Complexity: simple   Destruction method: cryotherapy   Informed consent: discussed and consent obtained   Timeout:  patient name, date of birth, surgical site, and procedure verified Lesion destroyed using liquid nitrogen: Yes   Region frozen until ice ball extended beyond lesion: Yes   Outcome: patient tolerated procedure well with no complications   Post-procedure details: wound care instructions given     Return in about 3 months (around 07/29/2024) for rosacea.   Documentation: I have reviewed the above documentation for accuracy and completeness, and I agree with the above.  I, Shirron Maranda, CMA, am acting as scribe for [employer], DO.   Delon Lenis, DO

## 2024-04-29 ENCOUNTER — Other Ambulatory Visit: Payer: Self-pay

## 2024-04-29 ENCOUNTER — Ambulatory Visit (INDEPENDENT_AMBULATORY_CARE_PROVIDER_SITE_OTHER): Admitting: Psychiatry

## 2024-04-29 VITALS — BP 139/91 | Wt 144.0 lb

## 2024-04-29 DIAGNOSIS — Z5181 Encounter for therapeutic drug level monitoring: Secondary | ICD-10-CM

## 2024-04-29 DIAGNOSIS — F1020 Alcohol dependence, uncomplicated: Secondary | ICD-10-CM

## 2024-04-29 DIAGNOSIS — F10982 Alcohol use, unspecified with alcohol-induced sleep disorder: Secondary | ICD-10-CM

## 2024-04-29 DIAGNOSIS — F411 Generalized anxiety disorder: Secondary | ICD-10-CM

## 2024-04-29 LAB — ANA W/REFLEX: ANA Titer 1: NEGATIVE

## 2024-04-29 MED ORDER — ACAMPROSATE CALCIUM 333 MG PO TBEC
666.0000 mg | DELAYED_RELEASE_TABLET | Freq: Two times a day (BID) | ORAL | 1 refills | Status: DC
  Filled 2024-04-29: qty 120, 30d supply, fill #0
  Filled 2024-05-24: qty 120, 30d supply, fill #1

## 2024-04-29 MED ORDER — NALTREXONE HCL 50 MG PO TABS
100.0000 mg | ORAL_TABLET | Freq: Every day | ORAL | 0 refills | Status: DC
  Filled 2024-04-29: qty 120, 60d supply, fill #0
  Filled 2024-05-10: qty 60, 30d supply, fill #0

## 2024-04-29 NOTE — Addendum Note (Signed)
 Addended by: CARVIN CROCK on: 04/29/2024 02:49 PM   Modules accepted: Level of Service

## 2024-04-29 NOTE — Patient Instructions (Signed)
 Thank you for attending your appointment today.  -- start acamprosate  666 mg twice a day -- Continue other medications as prescribed.  Please do not make any changes to medications without first discussing with your provider. If you are experiencing a psychiatric emergency, please call 911 or present to your nearest emergency department. Additional crisis, medication management, and therapy resources are included below.  [facility]  [address] [place], [place] [address] [phone] WALK-IN URGENT CARE 24/7 FOR ANYONE [address], [place], [place]  [phone] Fax: [phone] [email or web address] *Interpreters available *Accepts all insurance and uninsured for Urgent Care needs *Accepts Medicaid and uninsured for outpatient treatment (below)      ONLY FOR [facility]  Below:    Outpatient New Patient Assessment/Therapy Walk-ins:        Monday, Wednesday, and Thursday 8am until slots are full (first come, first served)                   New Patient Psychiatry/Medication Management        Monday-Friday 8am-11am (first come, first served)               For all walk-ins we ask that you arrive by 7:15am, because patients will be seen in the order of arrival.

## 2024-04-30 ENCOUNTER — Other Ambulatory Visit: Payer: Self-pay

## 2024-05-03 ENCOUNTER — Other Ambulatory Visit (INDEPENDENT_AMBULATORY_CARE_PROVIDER_SITE_OTHER)

## 2024-05-03 DIAGNOSIS — Z79899 Other long term (current) drug therapy: Secondary | ICD-10-CM | POA: Diagnosis not present

## 2024-05-03 DIAGNOSIS — Z5181 Encounter for therapeutic drug level monitoring: Secondary | ICD-10-CM

## 2024-05-03 DIAGNOSIS — F411 Generalized anxiety disorder: Secondary | ICD-10-CM

## 2024-05-03 NOTE — Progress Notes (Signed)
 Pt tolerated labs well in right hand with no problems  JNL,CMA

## 2024-05-04 LAB — VITAMIN B1

## 2024-05-04 LAB — VITAMIN D 25 HYDROXY (VIT D DEFICIENCY, FRACTURES): Vit D, 25-Hydroxy: 36 ng/mL (ref 30.0–100.0)

## 2024-05-04 LAB — B12 AND FOLATE PANEL
Folate: 7.5 ng/mL (ref >3.0)
Vitamin B-12: 998 pg/mL (ref 232–1245)

## 2024-05-10 ENCOUNTER — Other Ambulatory Visit: Payer: Self-pay

## 2024-05-10 ENCOUNTER — Ambulatory Visit: Payer: Self-pay | Admitting: Dermatology

## 2024-05-11 ENCOUNTER — Ambulatory Visit: Admitting: Podiatry

## 2024-05-11 ENCOUNTER — Other Ambulatory Visit: Payer: Self-pay

## 2024-05-12 ENCOUNTER — Encounter: Payer: Self-pay | Admitting: Gastroenterology

## 2024-05-12 ENCOUNTER — Other Ambulatory Visit: Payer: Self-pay

## 2024-05-12 ENCOUNTER — Telehealth: Payer: Self-pay

## 2024-05-12 ENCOUNTER — Ambulatory Visit: Admitting: Gastroenterology

## 2024-05-12 VITALS — BP 132/80 | HR 73 | Ht 61.0 in | Wt 141.0 lb

## 2024-05-12 DIAGNOSIS — K582 Mixed irritable bowel syndrome: Secondary | ICD-10-CM

## 2024-05-12 DIAGNOSIS — R1013 Epigastric pain: Secondary | ICD-10-CM | POA: Diagnosis not present

## 2024-05-12 MED ORDER — ESOMEPRAZOLE MAGNESIUM 40 MG PO CPDR
40.0000 mg | DELAYED_RELEASE_CAPSULE | Freq: Every day | ORAL | 2 refills | Status: DC
  Filled 2024-05-12 – 2024-05-14 (×2): qty 30, 30d supply, fill #0

## 2024-05-12 MED ORDER — DICYCLOMINE HCL 10 MG PO CAPS
10.0000 mg | ORAL_CAPSULE | Freq: Three times a day (TID) | ORAL | 0 refills | Status: DC
  Filled 2024-05-12: qty 90, 23d supply, fill #0

## 2024-05-12 NOTE — Telephone Encounter (Signed)
 Called and spoke with patient to offer her an EGD appt arriving at 1 pm tomorrow. Patient states that she is available. Patient denies any diabetic or weight loss medications. Patient is not on any blood thinners. Patient confirms access to MyChart and knows to expect formal instructions there. Patient knows to arrive by 1 pm with a care partner. Patient verbalized understanding and had no concerns at the end of the call.   Urgent ambulatory referral to GI in epic. Pre certification team has been notified of appt add on.  EGD instructions sent via MyChart.

## 2024-05-12 NOTE — Progress Notes (Signed)
 Chief Complaint: follow-up epigastric pain Primary GI Doctor:Dr Charlanne  HPI:  Patient is a  43  year old female patient with past medical history of migraines, anxiety, urticaria, neg celiac screen, significant allergies, who presents for follow-up  Last seen in GI office by Dr. Charlanne on 11/05/23 for   Interval History    Patient presents for evaluation of epigastric pain with nausea and poor appetite. She denies GERD or dysphagia.  She recently was on meloxicam  for 21 days for injury in August. She has discontinued it. She reports she is eating smaller amounts throughout the day and skipping breakfast which is abnormal for her. She reports eating does give her some relief with the discomfort. She has also had intermittent nausea as well. She notes she thinks it Donavon Kimrey be stress induced from issues at home.     She has history of IBS-C and was given samples of Linzess  72 mcg at last appt in Feburary for constipation which she states first day did not work, but then second day she had diarrhea so she stopped it. She tells me she goes between constipation and diarrhea.  She recently was started acamprosate  and enquired about the possible side effects.   Wt Readings from Last 3 Encounters:  05/12/24 141 lb (64 kg)  01/12/24 146 lb 9.6 oz (66.5 kg)  11/27/23 142 lb (64.4 kg)    Past Medical History:  Diagnosis Date   Abnormal Pap smear    had colpo in past, normal pap since then   AMA (advanced maternal age) multigravida 35+, second trimester 03/06/2017   Seen by [facility]. Low risk panorama. Normal NT. afp neg     Anxiety    Chronic hypertension in pregnancy 02/18/2014   [ ]  Aspirin 81 mg daily after 12 weeks; discontinue after 36 weeks  Current antihypertensives:  none     Baseline and surveillance labs (pulled in from [facility], refresh links as needed)           Lab Results      Component    Value    Date           PLT    327    02/02/2016           CREATININE    0.64    02/02/2016           AST    28     02/02/2016           ALT    39 (H)    02/02/2016           PRO   Depressive disorder    Dysmenorrhea    Eczema    GERD (gastroesophageal reflux disease) Dx 2010   Headache(784.0)    migraine   History of cesarean delivery 03/06/2017   Desires TOLAC, consent signed 06/09/2017        History of oral herpes simplex infection 05/29/2017   +IgM 1/2 antibody screen done for cold sores vs aphthous ulcers   IBS (irritable bowel syndrome)    Migraine    Multiple allergies    Polyhydramnios affecting pregnancy in third trimester 11/25/2013   [ ]  consider 39wk IOL vs EDC IOL  Had G1 IOL due to poly  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     Status post repeat low transverse cesarean section 08/20/2017   Supervision of high risk pregnancy, antepartum 01/06/2017            Clinic  [facility]    Prenatal Labs      Dating     LMP (IUI pregnancy)    Blood type: A/Positive/-- (04/16 1631) A pos      Genetic Screen    1 Screen: Neg   AFP:     Neg   Cffdna: low risk    Antibody:Negative (04/16 1631)neg      Anatomic US      Normal    Rubella: 4.57 (04/16 1631)imm      GTT    Third trimester: neg    RPR: Non Reactive (09/06 0836) neg      Flu vaccine     05/29/2017     Urticaria     Past Surgical History:  Procedure Laterality Date   CESAREAN SECTION N/A 11/26/2013   Procedure: CESAREAN SECTION;  Surgeon: Elveria Mungo, MD;  Location: [facility];  Service: Obstetrics;  Laterality: N/A;   CESAREAN SECTION N/A 08/17/2017   Procedure: CESAREAN SECTION;  Surgeon: Edsel Norleen GAILS, MD;  Location: [facility];  Service: Obstetrics;  Laterality: N/A;   COLONOSCOPY  07/11/2023   ESOPHAGOGASTRODUODENOSCOPY  05/23/2011   Mild gastritis. Otherwise normal EGD   LASER ABLATION Right 10/30/2023   Right Greater saphenous Vein with 10-20 stab phlebectomies    Current Outpatient Medications  Medication Sig Dispense Refill   acamprosate  (CAMPRAL ) 333 MG tablet Take 2 tablets (666 mg total) by mouth 2 (two) times daily. 120  tablet 1   Acetaminophen  (TYLENOL  PO) Take 500 mg by mouth as needed.     co-enzyme Q-10 30 MG capsule Take 300 mg by mouth daily.     cyclobenzaprine  (FLEXERIL ) 5 MG tablet Take 1 tablet (5 mg total) by mouth 3 (three) times daily as needed for muscle spasms. 60 tablet 3   Dapsone  5 % topical gel Apply 1 Application topically at bedtime. 60 g 2   diclofenac  Sodium (VOLTAREN ) 1 % GEL Apply 2 g topically 4 (four) times daily. 200 g 1   dicyclomine  (BENTYL ) 10 MG capsule Take 1 capsule (10 mg total) by mouth 4 (four) times daily -  before meals and at bedtime. 90 capsule 0   EPINEPHrine  0.3 mg/0.3 mL IJ SOAJ injection Inject 0.3 mg into the muscle as needed. 2 each 1   Erenumab -aooe (AIMOVIG ) 140 MG/ML SOAJ Inject 140 mg into the skin every 28 (twenty-eight) days. 1 mL 11   esomeprazole  (NEXIUM ) 40 MG capsule Take 1 capsule (40 mg total) by mouth daily at 12 noon. 30 capsule 2   levocetirizine (XYZAL ) 5 MG tablet Take 1 tablet by mouth 1 time daily. Khrystian Schauf take an additional tablet as needed for itching/hives flare. 30 tablet 5   Magnesium  400 MG CAPS Take by mouth.     Melatonin 10 MG TABS Take 1 tablet by mouth daily. (Patient taking differently: Take 0.5 tablets by mouth daily.) 90 tablet 1   naltrexone  (DEPADE) 50 MG tablet Take 2 tablets (100 mg total) by mouth at bedtime. 120 tablet 0   Ubrogepant  (UBRELVY ) 100 MG TABS Take 1 tablet (100 mg total) by mouth as needed. Josias Tomerlin repeat after 2 hours.  Maximum 2 tablets in 24 hours. 16 tablet 5   No current facility-administered medications for this visit.    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   Peanut -containing drug products Hives 04/08/2013   Hemabate  [carboprost  tromethamine ] Itching and Swelling 08/17/2017  Lomotil  [diphenoxylate ] Itching and Swelling 08/17/2017   Lysteda  [tranexamic acid ] Itching and Swelling  08/17/2017    Family History  Problem Relation Age of Onset   Migraines Mother    Hypothyroidism Mother    Arthritis Mother    Allergic rhinitis Father    Asthma Brother    Allergic rhinitis Brother    Alzheimer's disease Maternal Grandmother    Colon cancer Paternal Grandmother    Breast cancer Neg Hx     Review of Systems:    Constitutional: No weight loss, fever, chills, weakness or fatigue HEENT: Eyes: No change in vision               Ears, Nose, Throat:  No change in hearing or congestion Skin: No rash or itching Cardiovascular: No chest pain, chest pressure or palpitations   Respiratory: No SOB or cough Gastrointestinal: See HPI and otherwise negative Genitourinary: No dysuria or change in urinary frequency Neurological: No headache, dizziness or syncope Musculoskeletal: No new muscle or joint pain Hematologic: No bleeding or bruising Psychiatric: No history of depression or anxiety    Physical Exam:  Vital signs: BP 132/80   Pulse 73   Ht 5' 1 (1.549 m)   Wt 141 lb (64 kg)   BMI 26.64 kg/m   Constitutional:   Pleasant  female appears to be in NAD, Well developed, Well nourished, alert and cooperative Throat: Oral cavity and pharynx without inflammation, swelling or lesion.  Respiratory: Respirations even and unlabored. Lungs clear to auscultation bilaterally.   No wheezes, crackles, or rhonchi.  Cardiovascular: Normal S1, S2. Regular rate and rhythm. No peripheral edema, cyanosis or pallor.  Gastrointestinal:  Soft, nondistended, epigastric tenderness with palpation. No rebound or guarding. Normal bowel sounds. No appreciable masses or hepatomegaly. Rectal:  Not performed.  Msk:  Symmetrical without gross deformities. Without edema, no deformity or joint abnormality.  Neurologic:  Alert and  oriented x4;  grossly normal neurologically.  Skin:   Dry and intact without significant lesions or rashes.  RELEVANT LABS AND IMAGING: CBC    Latest Ref Rng & Units  10/17/2023    4:45 PM 10/13/2023    2:13 PM 05/29/2023    7:56 AM  CBC  WBC 4.0 - 10.5 K/uL 13.1  5.9  5.3   Hemoglobin 12.0 - 15.0 g/dL 86.2  87.1  87.4   Hematocrit 36.0 - 46.0 % 41.3  39.2  37.7   Platelets 150 - 400 K/uL 254  305  228.0      CMP     Latest Ref Rng & Units 10/17/2023    4:45 PM 05/29/2023    7:56 AM 11/22/2022    8:55 AM  CMP  Glucose 70 - 99 mg/dL 888  84  91   BUN 6 - 20 mg/dL 16  12  12    Creatinine 0.44 - 1.00 mg/dL 9.25  9.24  9.27   Sodium 135 - 145 mmol/L 137  137  139   Potassium 3.5 - 5.1 mmol/L 3.6  4.2  4.0   Chloride 98 - 111 mmol/L 107  105  104   CO2 22 - 32 mmol/L 21  26  21    Calcium  8.9 - 10.3 mg/dL 9.0  9.3  9.4   Total Protein 6.5 - 8.1 g/dL 7.2  6.9  6.8   Total Bilirubin 0.0 - 1.2 mg/dL 1.1  0.5  0.8   Alkaline Phos 38 - 126 U/L 63  67  87   AST  15 - 41 U/L 41  39  30   ALT 0 - 44 U/L 49  58  42      Lab Results  Component Value Date   TSH 1.680 10/13/2023  Past GI workup: EGD 05/23/2011 -Mild gastritis -Neg CLO -Neg SB Bx for celiac.   US  2012: neg HIDA with EF 2012: neg   Also had negative H. pylori breath test Neg celiac screen serology.     SH- married.  Has 2 daughters  Assessment: Encounter Diagnoses  Name Primary?   Abdominal pain, epigastric Yes   Irritable bowel syndrome with both constipation and diarrhea     43 year old female patient that presents with symptoms of epigastric pain, nausea, and poor appetite.  Patient was recently on meloxicam  for 3 weeks for injury in addition to having additional external stress at home.  Will start patient on Esomeprazole  40mg  po daily along with GERD diet to see if this helps alleviate her symptoms. Will also order upper GI endoscopy in [facility] with Dr. Charlanne to r/o PUD, gastritis, and/or h pylori.     Patient also has history of IBS which can fluctuate between diarrhea and constipation.  Patient takes Bentyl  as needed, will refill her prescription.  We discussed after the current  workup we will revisit her altered bowel habits and consider restarting Linzess .   Plan: - Start Esomeprazole  40 mg po daily -Strict GERD diet, no late meals  -Avoid NSAIDs Schedule EGD in [facility] with Dr. Charlanne . The risks and benefits of EGD with possible biopsies and esophageal dilation were discussed with the patient who agrees to proceed. -Continue bentyl  prn, refilled  Thank you for the courtesy of this consult. Please call me with any questions or concerns.   Jaymir Struble, FNP-C [facility] 05/12/2024, 4:24 PM  Cc: Theotis Haze ORN, NP

## 2024-05-12 NOTE — Patient Instructions (Signed)
 _______________________________________________________  If your blood pressure at your visit was 140/90 or greater, please contact your primary care physician to follow up on this.  _______________________________________________________  If you are age 42 or older, your body mass index should be between 23-30. Your Body mass index is 26.64 kg/m. If this is out of the aforementioned range listed, please consider follow up with your Primary Care Provider.  If you are age 17 or younger, your body mass index should be between 19-25. Your Body mass index is 26.64 kg/m. If this is out of the aformentioned range listed, please consider follow up with your Primary Care Provider.   ________________________________________________________  The [facility] providers would like to encourage you to use MYCHART to communicate with providers for non-urgent requests or questions.  Due to long hold times on the telephone, sending your provider a message by [facility] may be a faster and more efficient way to get a response.  Please allow 48 business hours for a response.  Please remember that this is for non-urgent requests.  _______________________________________________________  [facility] is using a team-based approach to care.  Your team is made up of your doctor and two to three APPS. Our APPS (Nurse Practitioners and Physician Assistants) work with your physician to ensure care continuity for you. They are fully qualified to address your health concerns and develop a treatment plan. They communicate directly with your gastroenterologist to care for you. Seeing the Advanced Practice Practitioners on your physician's team can help you by facilitating care more promptly, often allowing for earlier appointments, access to diagnostic testing, procedures, and other specialty referrals.   Thank you for trusting me with your gastrointestinal care. Deanna May, FNP-C

## 2024-05-13 ENCOUNTER — Other Ambulatory Visit: Payer: Self-pay

## 2024-05-13 ENCOUNTER — Encounter: Payer: Self-pay | Admitting: Gastroenterology

## 2024-05-13 ENCOUNTER — Ambulatory Visit: Admitting: Gastroenterology

## 2024-05-13 VITALS — BP 142/96 | HR 59 | Temp 98.2°F | Resp 15 | Ht 61.0 in | Wt 141.0 lb

## 2024-05-13 DIAGNOSIS — K2960 Other gastritis without bleeding: Secondary | ICD-10-CM | POA: Diagnosis not present

## 2024-05-13 DIAGNOSIS — K259 Gastric ulcer, unspecified as acute or chronic, without hemorrhage or perforation: Secondary | ICD-10-CM | POA: Diagnosis not present

## 2024-05-13 DIAGNOSIS — K3189 Other diseases of stomach and duodenum: Secondary | ICD-10-CM

## 2024-05-13 DIAGNOSIS — R1013 Epigastric pain: Secondary | ICD-10-CM

## 2024-05-13 DIAGNOSIS — K319 Disease of stomach and duodenum, unspecified: Secondary | ICD-10-CM | POA: Diagnosis not present

## 2024-05-13 DIAGNOSIS — K2970 Gastritis, unspecified, without bleeding: Secondary | ICD-10-CM | POA: Diagnosis not present

## 2024-05-13 DIAGNOSIS — F419 Anxiety disorder, unspecified: Secondary | ICD-10-CM | POA: Diagnosis not present

## 2024-05-13 DIAGNOSIS — F32A Depression, unspecified: Secondary | ICD-10-CM | POA: Diagnosis not present

## 2024-05-13 MED ORDER — ESOMEPRAZOLE MAGNESIUM 40 MG PO CPDR
40.0000 mg | DELAYED_RELEASE_CAPSULE | Freq: Every day | ORAL | 1 refills | Status: AC
  Filled 2024-05-13 (×2): qty 90, 90d supply, fill #0
  Filled 2024-08-16: qty 90, 90d supply, fill #1

## 2024-05-13 MED ORDER — SODIUM CHLORIDE 0.9 % IV SOLN: 500.0000 mL | Freq: Once | INTRAVENOUS | Status: DC

## 2024-05-13 NOTE — Op Note (Signed)
 [facility] Patient Name: Rhonda Graves Procedure Date: 05/13/2024 3:22 PM MRN: [id number] Endoscopist: Lynnie Bring , MD, [id number] Age: 43 Referring MD:  Date of Birth: 01/12/1981 Gender: Female Account #: [id number] Procedure:                Upper GI endoscopy Indications:              Epigastric abdominal pain Medicines:                Monitored Anesthesia Care Procedure:                Pre-Anesthesia Assessment:                           - Prior to the procedure, a History and Physical                            was performed, and patient medications and                            allergies were reviewed. The patient's tolerance of                            previous anesthesia was also reviewed. The risks                            and benefits of the procedure and the sedation                            options and risks were discussed with the patient.                            All questions were answered, and informed consent                            was obtained. Prior Anticoagulants: The patient has                            taken no anticoagulant or antiplatelet agents. ASA                            Grade Assessment: II - A patient with mild systemic                            disease. After reviewing the risks and benefits,                            the patient was deemed in satisfactory condition to                            undergo the procedure.                           After obtaining informed consent, the endoscope was  passed under direct vision. Throughout the                            procedure, the patient's blood pressure, pulse, and                            oxygen saturations were monitored continuously. The                            Olympus Scope [id number] was introduced through the                            mouth, and advanced to the second part of duodenum.                            The upper GI endoscopy was  accomplished without                            difficulty. The patient tolerated the procedure                            well. Scope In: Scope Out: Findings:                 The examined esophagus was normal.                           The Z-line was regular and was found 36 cm from the                            incisors.                           One non-bleeding cratered gastric ulcer with no                            stigmata of bleeding was found in the prepyloric                            region of the stomach with surrounding gastritis.                            The lesion was 10 mm in largest dimension. Biopsies                            were taken with a cold forceps for histology.                           The examined duodenum was normal. Biopsies for                            histology were taken with a cold forceps for                            evaluation of  celiac disease. Complications:            No immediate complications. Estimated Blood Loss:     Estimated blood loss: none. Impression:               - Non-bleeding gastric ulcer with no stigmata of                            bleeding. Biopsied. Recommendation:           - Patient has a contact number available for                            emergencies. The signs and symptoms of potential                            delayed complications were discussed with the                            patient. Return to normal activities tomorrow.                            Written discharge instructions were provided to the                            patient.                           - Resume previous diet.                           - Continue present medications including nexium                             40mg  po QD x 12 weeks.                           - No aspirin, ibuprofen , naproxen, or other                            non-steroidal anti-inflammatory drugs.                           - Await pathology results.                            - Can consider rpt EGD in 12 weeks to ensure                            healing.                           - The findings and recommendations were discussed                            with the patient's family. Lynnie Bring, MD 05/13/2024 3:47:23 PM This report has been signed electronically.

## 2024-05-13 NOTE — Progress Notes (Signed)
 Agree with assessment/plan.  Edman Circle, MD [facility] [phone]

## 2024-05-13 NOTE — Progress Notes (Signed)
 1528 Robinul 0.1 mg IV given due large amount of secretions upon assessment.  MD made aware, vss

## 2024-05-13 NOTE — Progress Notes (Signed)
 Chief Complaint: follow-up epigastric pain Primary GI Doctor:Dr Charlanne   HPI:  Patient is a  43  year old female patient with past medical history of migraines, anxiety, urticaria, neg celiac screen, significant allergies, who presents for follow-up  Last seen in GI office by Dr. Charlanne on 11/05/23 for    Interval History    Patient presents for evaluation of epigastric pain with nausea and poor appetite. She denies GERD or dysphagia.  She recently was on meloxicam  for 21 days for injury in August. She has discontinued it. She reports she is eating smaller amounts throughout the day and skipping breakfast which is abnormal for her. She reports eating does give her some relief with the discomfort. She has also had intermittent nausea as well. She notes she thinks it may be stress induced from issues at home.     She has history of IBS-C and was given samples of Linzess  72 mcg at last appt in Feburary for constipation which she states first day did not work, but then second day she had diarrhea so she stopped it. She tells me she goes between constipation and diarrhea.   She recently was started acamprosate  and enquired about the possible side effects.       Wt Readings from Last 3 Encounters:  05/12/24 141 lb (64 kg)  01/12/24 146 lb 9.6 oz (66.5 kg)  11/27/23 142 lb (64.4 kg)        Past Medical History:  Diagnosis Date   Abnormal Pap smear      had colpo in past, normal pap since then   AMA (advanced maternal age) multigravida 35+, second trimester 03/06/2017    Seen by [facility]. Low risk panorama. Normal NT. afp neg     Anxiety     Chronic hypertension in pregnancy 02/18/2014    [ ]  Aspirin 81 mg daily after 12 weeks; discontinue after 36 weeks  Current antihypertensives:  none     Baseline and surveillance labs (pulled in from [facility], refresh links as needed)           Lab Results      Component    Value    Date           PLT    327    02/02/2016           CREATININE    0.64    02/02/2016            AST    28    02/02/2016           ALT    39 (H)    02/02/2016           PRO   Depressive disorder     Dysmenorrhea     Eczema     GERD (gastroesophageal reflux disease) Dx 2010   Headache(784.0)      migraine   History of cesarean delivery 03/06/2017    Desires TOLAC, consent signed 06/09/2017        History of oral herpes simplex infection 05/29/2017    +IgM 1/2 antibody screen done for cold sores vs aphthous ulcers   IBS (irritable bowel syndrome)     Migraine     Multiple allergies     Polyhydramnios affecting pregnancy in third trimester 11/25/2013    [ ]  consider 39wk IOL vs EDC IOL  Had G1 IOL due to poly  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     Status post repeat low  transverse cesarean section 08/20/2017   Supervision of high risk pregnancy, antepartum 01/06/2017             Clinic     [facility]    Prenatal Labs      Dating     LMP (IUI pregnancy)    Blood type: A/Positive/-- (04/16 1631) A pos      Genetic Screen    1 Screen: Neg   AFP:     Neg   Cffdna: low risk    Antibody:Negative (04/16 1631)neg      Anatomic US      Normal    Rubella: 4.57 (04/16 1631)imm      GTT    Third trimester: neg    RPR: Non Reactive (09/06 0836) neg      Flu vaccine     05/29/2017     Urticaria                 Past Surgical History:  Procedure Laterality Date   CESAREAN SECTION N/A 11/26/2013    Procedure: CESAREAN SECTION;  Surgeon: Elveria Mungo, MD;  Location: [facility];  Service: Obstetrics;  Laterality: N/A;   CESAREAN SECTION N/A 08/17/2017    Procedure: CESAREAN SECTION;  Surgeon: Edsel Norleen GAILS, MD;  Location: [facility];  Service: Obstetrics;  Laterality: N/A;   COLONOSCOPY   07/11/2023   ESOPHAGOGASTRODUODENOSCOPY   05/23/2011    Mild gastritis. Otherwise normal EGD   LASER ABLATION Right 10/30/2023    Right Greater saphenous Vein with 10-20 stab phlebectomies                Current Outpatient Medications  Medication Sig Dispense Refill   acamprosate  (CAMPRAL ) 333 MG  tablet Take 2 tablets (666 mg total) by mouth 2 (two) times daily. 120 tablet 1   Acetaminophen  (TYLENOL  PO) Take 500 mg by mouth as needed.       co-enzyme Q-10 30 MG capsule Take 300 mg by mouth daily.       cyclobenzaprine  (FLEXERIL ) 5 MG tablet Take 1 tablet (5 mg total) by mouth 3 (three) times daily as needed for muscle spasms. 60 tablet 3   Dapsone  5 % topical gel Apply 1 Application topically at bedtime. 60 g 2   diclofenac  Sodium (VOLTAREN ) 1 % GEL Apply 2 g topically 4 (four) times daily. 200 g 1   dicyclomine  (BENTYL ) 10 MG capsule Take 1 capsule (10 mg total) by mouth 4 (four) times daily -  before meals and at bedtime. 90 capsule 0   EPINEPHrine  0.3 mg/0.3 mL IJ SOAJ injection Inject 0.3 mg into the muscle as needed. 2 each 1   Erenumab -aooe (AIMOVIG ) 140 MG/ML SOAJ Inject 140 mg into the skin every 28 (twenty-eight) days. 1 mL 11   esomeprazole  (NEXIUM ) 40 MG capsule Take 1 capsule (40 mg total) by mouth daily at 12 noon. 30 capsule 2   levocetirizine (XYZAL ) 5 MG tablet Take 1 tablet by mouth 1 time daily. May take an additional tablet as needed for itching/hives flare. 30 tablet 5   Magnesium  400 MG CAPS Take by mouth.       Melatonin 10 MG TABS Take 1 tablet by mouth daily. (Patient taking differently: Take 0.5 tablets by mouth daily.) 90 tablet 1   naltrexone  (DEPADE) 50 MG tablet Take 2 tablets (100 mg total) by mouth at bedtime. 120 tablet 0   Ubrogepant  (UBRELVY ) 100 MG TABS Take 1 tablet (100 mg total) by mouth as needed. May repeat  after 2 hours.  Maximum 2 tablets in 24 hours. 16 tablet 5      No current facility-administered medications for this visit.             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   Peanut -containing drug products Hives 04/08/2013   Hemabate  [carboprost  tromethamine ] Itching and Swelling 08/17/2017   Lomotil  [diphenoxylate ]  Itching and Swelling 08/17/2017   Lysteda  [tranexamic acid ] Itching and Swelling 08/17/2017           Family History  Problem Relation Age of Onset   Migraines Mother     Hypothyroidism Mother     Arthritis Mother     Allergic rhinitis Father     Asthma Brother     Allergic rhinitis Brother     Alzheimer's disease Maternal Grandmother     Colon cancer Paternal Grandmother     Breast cancer Neg Hx            Review of Systems:    Constitutional: No weight loss, fever, chills, weakness or fatigue HEENT: Eyes: No change in vision               Ears, Nose, Throat:  No change in hearing or congestion Skin: No rash or itching Cardiovascular: No chest pain, chest pressure or palpitations   Respiratory: No SOB or cough Gastrointestinal: See HPI and otherwise negative Genitourinary: No dysuria or change in urinary frequency Neurological: No headache, dizziness or syncope Musculoskeletal: No new muscle or joint pain Hematologic: No bleeding or bruising Psychiatric: No history of depression or anxiety      Physical Exam:  Vital signs: BP 132/80   Pulse 73   Ht 5' 1 (1.549 m)   Wt 141 lb (64 kg)   BMI 26.64 kg/m    Constitutional:   Pleasant  female appears to be in NAD, Well developed, Well nourished, alert and cooperative Throat: Oral cavity and pharynx without inflammation, swelling or lesion.  Respiratory: Respirations even and unlabored. Lungs clear to auscultation bilaterally.   No wheezes, crackles, or rhonchi.  Cardiovascular: Normal S1, S2. Regular rate and rhythm. No peripheral edema, cyanosis or pallor.  Gastrointestinal:  Soft, nondistended, epigastric tenderness with palpation. No rebound or guarding. Normal bowel sounds. No appreciable masses or hepatomegaly. Rectal:  Not performed.  Msk:  Symmetrical without gross deformities. Without edema, no deformity or joint abnormality.  Neurologic:  Alert and  oriented x4;  grossly normal neurologically.  Skin:   Dry and  intact without significant lesions or rashes.   RELEVANT LABS AND IMAGING: CBC     Latest Ref Rng & Units 10/17/2023    4:45 PM 10/13/2023    2:13 PM 05/29/2023    7:56 AM  CBC  WBC 4.0 - 10.5 K/uL 13.1  5.9  5.3   Hemoglobin 12.0 - 15.0 g/dL 86.2  87.1  87.4   Hematocrit 36.0 - 46.0 % 41.3  39.2  37.7   Platelets 150 - 400 K/uL 254  305  228.0       CMP         Latest Ref Rng & Units 10/17/2023    4:45 PM 05/29/2023    7:56 AM 11/22/2022    8:55 AM  CMP  Glucose 70 - 99 mg/dL 888  84  91   BUN 6 - 20 mg/dL 16  12  12    Creatinine  0.44 - 1.00 mg/dL 9.25  9.24  9.27   Sodium 135 - 145 mmol/L 137  137  139   Potassium 3.5 - 5.1 mmol/L 3.6  4.2  4.0   Chloride 98 - 111 mmol/L 107  105  104   CO2 22 - 32 mmol/L 21  26  21    Calcium  8.9 - 10.3 mg/dL 9.0  9.3  9.4   Total Protein 6.5 - 8.1 g/dL 7.2  6.9  6.8   Total Bilirubin 0.0 - 1.2 mg/dL 1.1  0.5  0.8   Alkaline Phos 38 - 126 U/L 63  67  87   AST 15 - 41 U/L 41  39  30   ALT 0 - 44 U/L 49  58  42       Recent Labs       Lab Results  Component Value Date    TSH 1.680 10/13/2023    Past GI workup: EGD 05/23/2011 -Mild gastritis -Neg CLO -Neg SB Bx for celiac.   US  2012: neg HIDA with EF 2012: neg   Also had negative H. pylori breath test Neg celiac screen serology.     SH- married.  Has 2 daughters   Assessment:     Encounter Diagnoses  Name Primary?   Abdominal pain, epigastric Yes   Irritable bowel syndrome with both constipation and diarrhea      43 year old female patient that presents with symptoms of epigastric pain, nausea, and poor appetite.  Patient was recently on meloxicam  for 3 weeks for injury in addition to having additional external stress at home.  Will start patient on Esomeprazole  40mg  po daily along with GERD diet to see if this helps alleviate her symptoms. Will also order upper GI endoscopy in [facility] with Dr. Charlanne to r/o PUD, gastritis, and/or h pylori.     Patient also has history of IBS which  can fluctuate between diarrhea and constipation.  Patient takes Bentyl  as needed, will refill her prescription.  We discussed after the current workup we will revisit her altered bowel habits and consider restarting Linzess .    Plan: - Start Esomeprazole  40 mg po daily -Strict GERD diet, no late meals  -Avoid NSAIDs Schedule EGD in [facility] with Dr. Charlanne . The risks and benefits of EGD with possible biopsies and esophageal dilation were discussed with the patient who agrees to proceed. -Continue bentyl  prn, refilled   Thank you for the courtesy of this consult. Please call me with any questions or concerns.    Deanna May, FNP-C     Attending physician's note   I have taken history, reviewed the chart and examined the patient. I performed a substantive portion of this encounter, including complete performance of at least one of the key components, in conjunction with the APP. I agree with the Advanced Practitioner's note, impression and recommendations.   Epi pain  For EGD with Bx   Anselm Charlanne, MD [facility] [phone]

## 2024-05-13 NOTE — Progress Notes (Signed)
 Report given to PACU, vss

## 2024-05-13 NOTE — Patient Instructions (Addendum)
 Resume previous diet. Continue present medications including Nexium  40 mg by mouth daily x 12 weeks. (Sent to pharmacy) No aspirin, ibuprofen , naproxen, or other NSAID's  Awaiting pathology results. Can consider repeat EGD in 12 weeks to ensure healing.  Handouts provided on gastritis.  YOU HAD AN ENDOSCOPIC PROCEDURE TODAY AT THE [facility]:   Refer to the procedure report that was given to you for any specific questions about what was found during the examination.  If the procedure report does not answer your questions, please call your gastroenterologist to clarify.  If you requested that your care partner not be given the details of your procedure findings, then the procedure report has been included in a sealed envelope for you to review at your convenience later.  YOU SHOULD EXPECT: Some feelings of bloating in the abdomen. Passage of more gas than usual.  Walking can help get rid of the air that was put into your GI tract during the procedure and reduce the bloating. If you had a lower endoscopy (such as a colonoscopy or flexible sigmoidoscopy) you may notice spotting of blood in your stool or on the toilet paper. If you underwent a bowel prep for your procedure, you may not have a normal bowel movement for a few days.  Please Note:  You might notice some irritation and congestion in your nose or some drainage.  This is from the oxygen used during your procedure.  There is no need for concern and it should clear up in a day or so.  SYMPTOMS TO REPORT IMMEDIATELY:  Following upper endoscopy (EGD)  Vomiting of blood or coffee ground material  New chest pain or pain under the shoulder blades  Painful or persistently difficult swallowing  New shortness of breath  Fever of 100F or higher  Black, tarry-looking stools  For urgent or emergent issues, a gastroenterologist can be reached at any hour by calling [phone]. Do not use MyChart messaging for urgent concerns.     DIET:  We do recommend a small meal at first, but then you may proceed to your regular diet.  Drink plenty of fluids but you should avoid alcoholic beverages for 24 hours.  ACTIVITY:  You should plan to take it easy for the rest of today and you should NOT DRIVE or use heavy machinery until tomorrow (because of the sedation medicines used during the test).    FOLLOW UP: Our staff will call the number listed on your records the next business day following your procedure.  We will call around 7:15- 8:00 am to check on you and address any questions or concerns that you may have regarding the information given to you following your procedure. If we do not reach you, we will leave a message.     If any biopsies were taken you will be contacted by phone or by letter within the next 1-3 weeks.  Please call us  at [phone] if you have not heard about the biopsies in 3 weeks.    SIGNATURES/CONFIDENTIALITY: You and/or your care partner have signed paperwork which will be entered into your electronic medical record.  These signatures attest to the fact that that the information above on your After Visit Summary has been reviewed and is understood.  Full responsibility of the confidentiality of this discharge information lies with you and/or your care-partner.

## 2024-05-14 ENCOUNTER — Telehealth: Payer: Self-pay

## 2024-05-14 ENCOUNTER — Other Ambulatory Visit: Payer: Self-pay

## 2024-05-14 DIAGNOSIS — R1013 Epigastric pain: Secondary | ICD-10-CM

## 2024-05-14 DIAGNOSIS — K259 Gastric ulcer, unspecified as acute or chronic, without hemorrhage or perforation: Secondary | ICD-10-CM

## 2024-05-14 NOTE — Telephone Encounter (Signed)
  Follow up Call-     05/13/2024    2:19 PM 07/11/2023   10:45 AM  Call back number  Post procedure Call Back phone  # [phone]  Permission to leave phone message Yes Yes     Follow up call, LVM

## 2024-05-14 NOTE — Telephone Encounter (Signed)
 Spoke to patient and agreed to schedule for now she said she will cancel if she needs to

## 2024-05-17 ENCOUNTER — Other Ambulatory Visit: Payer: Self-pay

## 2024-05-18 LAB — SURGICAL PATHOLOGY

## 2024-05-25 ENCOUNTER — Other Ambulatory Visit: Payer: Self-pay
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
## 2024-05-26 NOTE — Progress Notes (Unsigned)
 NEUROLOGY FOLLOW UP OFFICE NOTE  [name] [id number]  Assessment/Plan:   Migraine without aura, without status migrainosus, not intractable Chronic tension-type headache, not intractable  Migraine prevention:  Aimovig  140mg  every 28 days  Migraine rescue:  Ubrelvy  100mg   Limit use of pain relievers to no more than 2 days out of week to prevent risk of rebound or medication-overuse headache. Keep headache diary Follow up 6 months.    Subjective:  Rhonda Graves is a 43 year old left-handed female with IBS, alcohol use disorder, depression and anxiety who follows up for headaches.  UPDATE: Intensity:  moderate to severe Duration:  less than 2 hours with Ubrelvy  (on occasion may last all day during the day or two before onset of menses) Frequency:  4  a month Current NSAIDS/analgesics:  none Current triptans:  none Current ergotamine:  none Current anti-emetic:  none Current muscle relaxants:  Flexeril  5mg  TID PRN Current Antihypertensive medications:  none Current Antidepressant medications:  none Current Anticonvulsant medications:  none Current anti-CGRP:  Aimovig  140mg , Ubrelvy  100mg  Current Vitamins/Herbal/Supplements:  magnesium  400mg  daily, CoQ10, Xyzal , melatonin Current Antihistamines/Decongestants: none Other therapy:  none    Caffeine:  1 cup coffee in morning (1/2 caff and 1/2 decaff) Diet:  1 liter water daily.  Does not skip meals.  Chocolate and cheese are not triggers Exercise:  running on treadmill, rowing twice a week Depression:  no; Anxiety:  yes.   Sleep hygiene:  Hard to shut off brain at night.  Worries.  Difficult to fall asleep  HISTORY: Onset:  teenager Location:  varies - behind eyes, temple, unilateral either side Quality:  sharp, throbbing Intensity:  Severe.  She denies new headache, thunderclap headache or severe headache that wakes her from sleep. Aura:  absent Prodrome:  absent Associated symptoms:  allodynia of scalp,  photophobia, phonophobia  She denies nausea, vomiting, visual disturbance, associated unilateral numbness or weakness. Duration:  several hours to a day (untreated), 2-3 hours (treated with sumatriptan ) Frequency:  on average once a week (may have a week without migraine and then one two consecutive days in a row) Triggers:  menses (severe migraine 2 days before her period), wine, sleep deprivation, dehydration Relieving factors:  rest Activity:  cannot function  Also has tension-type headaches almost daily.  Usually back of head or sometimes across forehead.  Mild-moderate intensity and occurs at end of the day.  Takes Tylenol  almost daily.  Past NSAIDS/analgesics:  diclofenac , ibuprofen , Tylenol ,Toradol  Past abortive triptans:  eletriptan , sumatriptan  (throat pressure) Past abortive ergotamine:  none Past muscle relaxants:  none Past anti-emetic:  none Past antihypertensive medications:  amlodipine , hydrochlorothiazide , nifedepine Past antidepressant medications:  sertraline  Past anticonvulsant medications:  topiramate Past anti-CGRP:  none Past vitamins/Herbal/Supplements:  none Past antihistamines/decongestants:  Zyrtec , Benadryl , Xyzal , Flonase  Other past therapies:  none   Family history of headache:  mom (migraines)  PAST MEDICAL HISTORY: Past Medical History:  Diagnosis Date   Abnormal Pap smear    had colpo in past, normal pap since then   AMA (advanced maternal age) multigravida 35+, second trimester 03/06/2017   Seen by [facility]. Low risk panorama. Normal NT. afp neg     Anxiety    Chronic hypertension in pregnancy 02/18/2014   [ ]  Aspirin 81 mg daily after 12 weeks; discontinue after 36 weeks  Current antihypertensives:  none     Baseline and surveillance labs (pulled in from [facility], refresh links as needed)           Lab  Results      Component    Value    Date           PLT    327    02/02/2016           CREATININE    0.64    02/02/2016           AST    28    02/02/2016            ALT    39 (H)    02/02/2016           PRO   Depressive disorder    Dysmenorrhea    Eczema    GERD (gastroesophageal reflux disease) Dx 2010   Headache(784.0)    migraine   History of cesarean delivery 03/06/2017   Desires TOLAC, consent signed 06/09/2017        History of oral herpes simplex infection 05/29/2017   +IgM 1/2 antibody screen done for cold sores vs aphthous ulcers   IBS (irritable bowel syndrome)    Migraine    Multiple allergies    Polyhydramnios affecting pregnancy in third trimester 11/25/2013   [ ]  consider 39wk IOL vs EDC IOL  Had G1 IOL due to poly  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     Status post repeat low transverse cesarean section 08/20/2017   Supervision of high risk pregnancy, antepartum 01/06/2017            Clinic     [facility]    Prenatal Labs      Dating     LMP (IUI pregnancy)    Blood type: A/Positive/-- (04/16 1631) A pos      Genetic Screen    1 Screen: Neg   AFP:     Neg   Cffdna: low risk    Antibody:Negative (04/16 1631)neg      Anatomic US      Normal    Rubella: 4.57 (04/16 1631)imm      GTT    Third trimester: neg    RPR: Non Reactive (09/06 0836) neg      Flu vaccine     05/29/2017     Urticaria     MEDICATIONS: Current Outpatient Medications on File Prior to Visit  Medication Sig Dispense Refill   acamprosate  (CAMPRAL ) 333 MG tablet Take 2 tablets (666 mg total) by mouth 2 (two) times daily. 120 tablet 1   Acetaminophen  (TYLENOL  PO) Take 500 mg by mouth as needed.     co-enzyme Q-10 30 MG capsule Take 300 mg by mouth daily.     cyclobenzaprine  (FLEXERIL ) 5 MG tablet Take 1 tablet (5 mg total) by mouth 3 (three) times daily as needed for muscle spasms. 60 tablet 3   Dapsone  5 % topical gel Apply 1 Application topically at bedtime. 60 g 2   diclofenac  Sodium (VOLTAREN ) 1 % GEL Apply 2 g topically 4 (four) times daily. 200 g 1   dicyclomine  (BENTYL ) 10 MG capsule Take 1 capsule (10 mg total) by mouth 4 (four) times daily -  before meals and at bedtime. 90  capsule 0   EPINEPHrine  0.3 mg/0.3 mL IJ SOAJ injection Inject 0.3 mg into the muscle as needed. 2 each 1   Erenumab -aooe (AIMOVIG ) 140 MG/ML SOAJ Inject 140 mg into the skin every 28 (twenty-eight) days. 1 mL 11   esomeprazole  (NEXIUM ) 40 MG capsule Take 1 capsule (40 mg total) by mouth daily at 12 noon. 30 capsule 2  esomeprazole  (NEXIUM ) 40 MG capsule Take 1 capsule (40 mg total) by mouth daily FOR 12 WEEKS. 90 capsule 1   levocetirizine (XYZAL ) 5 MG tablet Take 1 tablet by mouth 1 time daily. May take an additional tablet as needed for itching/hives flare. 30 tablet 5   Magnesium  400 MG CAPS Take by mouth.     Melatonin 10 MG TABS Take 1 tablet by mouth daily. (Patient taking differently: Take 0.5 tablets by mouth daily.) 90 tablet 1   naltrexone  (DEPADE) 50 MG tablet Take 2 tablets (100 mg total) by mouth at bedtime. 120 tablet 0   Ubrogepant  (UBRELVY ) 100 MG TABS Take 1 tablet (100 mg total) by mouth as needed. May repeat after 2 hours.  Maximum 2 tablets in 24 hours. 16 tablet 5   No current facility-administered medications on file prior to visit.    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 Uncertain what caused reaction.   Lomotil  [Diphenoxylate ] Itching and Swelling    Had allergic reaction after Lomotil , Lysteda  and Hemabate . Uncertain what caused reaction.   Lysteda  [Tranexamic Acid ] Itching and Swelling    Had allergic reaction after Lomotil , Lysteda  and Hemabate . Uncertain what caused reaction.    FAMILY HISTORY: Family History  Problem Relation Age of Onset   Migraines Mother    Hypothyroidism Mother    Arthritis Mother    Allergic rhinitis Father    Asthma Brother    Allergic rhinitis Brother    Alzheimer's disease Maternal Grandmother     Colon cancer Paternal Grandmother    Breast cancer Neg Hx    Esophageal cancer Neg Hx    Stomach cancer Neg Hx    Rectal cancer Neg Hx       Objective:  *** General: No acute distress.  Patient appears well-groomed.   Head:  Normocephalic/atraumatic Neck:  Supple.  No paraspinal tenderness.  Full range of motion. Heart:  Regular rate and rhythm. Neuro:  Alert and oriented.  Speech fluent and not dysarthric.  Language intact.  CN II-XII intact.  Bulk and tone normal.  Muscle strength 5/5 throughout.  Sensation to light touch intact.  Deep tendon reflexes 2+ throughout, toes downgoing.  Gait normal.  Romberg negative.   Juliene Dunnings, DO  CC: Haze Servant, NP

## 2024-05-27 ENCOUNTER — Ambulatory Visit (HOSPITAL_COMMUNITY)

## 2024-05-27 ENCOUNTER — Encounter (HOSPITAL_COMMUNITY): Payer: Self-pay

## 2024-05-27 ENCOUNTER — Ambulatory Visit: Admitting: Neurology

## 2024-05-27 ENCOUNTER — Encounter: Payer: Self-pay | Admitting: Neurology

## 2024-05-27 ENCOUNTER — Telehealth: Payer: Self-pay

## 2024-05-27 ENCOUNTER — Other Ambulatory Visit: Payer: Self-pay

## 2024-05-27 VITALS — BP 155/97 | HR 87 | Ht 60.0 in | Wt 139.0 lb

## 2024-05-27 DIAGNOSIS — G43009 Migraine without aura, not intractable, without status migrainosus: Secondary | ICD-10-CM

## 2024-05-27 DIAGNOSIS — G44219 Episodic tension-type headache, not intractable: Secondary | ICD-10-CM | POA: Diagnosis not present

## 2024-05-27 MED ORDER — AJOVY 225 MG/1.5ML ~~LOC~~ SOAJ
225.0000 mg | SUBCUTANEOUS | 11 refills | Status: AC
  Filled 2024-05-27: qty 1.5, 28d supply, fill #0
  Filled 2024-06-22: qty 1.5, 28d supply, fill #1
  Filled 2024-07-20: qty 1.5, 28d supply, fill #2
  Filled 2024-08-17 (×2): qty 1.5, 28d supply, fill #3
  Filled 2024-09-14: qty 1.5, 28d supply, fill #4
  Filled 2024-10-12 – 2024-10-18 (×2): qty 1.5, 28d supply, fill #5

## 2024-05-27 NOTE — Telephone Encounter (Signed)
 PA needed for Ajovy.

## 2024-05-27 NOTE — Patient Instructions (Signed)
 Plan to change from Aimovig  to Ajovy  injection every 28 days.  If no improvement in 3 months, contact me Ubrelvy  Limit use of pain relievers to no more than 9 days out of the month to prevent risk of rebound or medication-overuse headache. Keep headache diary

## 2024-05-28 ENCOUNTER — Other Ambulatory Visit: Payer: Self-pay

## 2024-05-28 ENCOUNTER — Ambulatory Visit (INDEPENDENT_AMBULATORY_CARE_PROVIDER_SITE_OTHER): Payer: Self-pay | Admitting: Plastic Surgery

## 2024-05-28 ENCOUNTER — Other Ambulatory Visit (HOSPITAL_COMMUNITY): Payer: Self-pay

## 2024-05-28 ENCOUNTER — Telehealth: Payer: Self-pay

## 2024-05-28 DIAGNOSIS — Z719 Counseling, unspecified: Secondary | ICD-10-CM

## 2024-05-28 MED ORDER — LIDOCAINE 23% - TETRACAINE 7% TOPICAL OINTMENT (PLASTICIZED)
1.0000 | TOPICAL_OINTMENT | Freq: Once | CUTANEOUS | 0 refills | Status: AC
  Filled 2024-05-28: qty 60, 1d supply, fill #0
  Filled 2024-06-02: qty 60, fill #0

## 2024-05-28 MED ORDER — LIDOCAINE 23% - TETRACAINE 7% TOPICAL OINTMENT (PLASTICIZED)
1.0000 | TOPICAL_OINTMENT | Freq: Once | CUTANEOUS | 0 refills | Status: DC
  Filled 2024-05-28: qty 60, fill #0

## 2024-05-28 NOTE — Telephone Encounter (Signed)
 PA request has been Approved. New Encounter has been or will be created for follow up. For additional info see Pharmacy Prior Auth telephone encounter from 05-28-2024.

## 2024-05-28 NOTE — Addendum Note (Signed)
 Addended by: LOWERY ESTEFANA RAMAN on: 05/28/2024 05:26 PM   Modules accepted: Orders

## 2024-05-28 NOTE — Telephone Encounter (Signed)
 Pharmacy Patient Advocate Encounter   Received notification from Pt Calls Messages that prior authorization for Rhonda Graves  (fremanezumab -vfrm) injection 225MG /1.5ML auto-injectors is required/requested.   Insurance verification completed.   The patient is insured through [employer] .   Prior Authorization for Rhonda Graves  (fremanezumab -vfrm) injection 225MG /1.5ML auto-injectors has been APPROVED from 05-28-2024 to 08-26-2024   PA #/Case ID/Reference #: [id number]

## 2024-05-28 NOTE — Progress Notes (Signed)
 Preoperative Dx: Rosacea and hyperpigmentation  Postoperative Dx:  same  Procedure: laser to face  Anesthesia: none  Description of Procedure:  Risks and complications were explained to the patient. Consent was confirmed and signed. Eye protection was placed. Time out was called and all information was confirmed to be correct. The area  area was prepped with alcohol and wiped dry. The aerobic laser was set at 532 at preset J/cm2 and then 515 at preset joules for pigment. The face was lasered. The patient tolerated the procedure well and there were no complications. The patient is to follow up in 4 weeks.

## 2024-05-31 ENCOUNTER — Other Ambulatory Visit: Payer: Self-pay

## 2024-05-31 ENCOUNTER — Other Ambulatory Visit: Payer: Self-pay | Admitting: Gastroenterology

## 2024-05-31 DIAGNOSIS — K582 Mixed irritable bowel syndrome: Secondary | ICD-10-CM

## 2024-05-31 MED ORDER — DICYCLOMINE HCL 10 MG PO CAPS
10.0000 mg | ORAL_CAPSULE | Freq: Three times a day (TID) | ORAL | 0 refills | Status: AC
Start: 2024-05-31 — End: ?
  Filled 2024-05-31 – 2024-06-02 (×2): qty 90, 23d supply, fill #0

## 2024-06-01 ENCOUNTER — Other Ambulatory Visit: Payer: Self-pay

## 2024-06-01 ENCOUNTER — Other Ambulatory Visit (HOSPITAL_COMMUNITY): Payer: Self-pay

## 2024-06-02 ENCOUNTER — Other Ambulatory Visit: Payer: Self-pay

## 2024-06-03 ENCOUNTER — Other Ambulatory Visit: Payer: Self-pay

## 2024-06-03 ENCOUNTER — Telehealth (INDEPENDENT_AMBULATORY_CARE_PROVIDER_SITE_OTHER): Admitting: Psychiatry

## 2024-06-03 DIAGNOSIS — F10982 Alcohol use, unspecified with alcohol-induced sleep disorder: Secondary | ICD-10-CM

## 2024-06-03 DIAGNOSIS — F1020 Alcohol dependence, uncomplicated: Secondary | ICD-10-CM | POA: Diagnosis not present

## 2024-06-03 DIAGNOSIS — F411 Generalized anxiety disorder: Secondary | ICD-10-CM

## 2024-06-03 MED ORDER — NALTREXONE HCL 50 MG PO TABS
100.0000 mg | ORAL_TABLET | Freq: Every day | ORAL | 0 refills | Status: DC
  Filled 2024-06-03: qty 120, 60d supply, fill #0
  Filled 2024-06-06: qty 60, 30d supply, fill #0
  Filled 2024-07-06: qty 60, 30d supply, fill #1

## 2024-06-03 MED ORDER — ACAMPROSATE CALCIUM 333 MG PO TBEC
666.0000 mg | DELAYED_RELEASE_TABLET | Freq: Three times a day (TID) | ORAL | 1 refills | Status: DC
  Filled 2024-06-03 – 2024-06-21 (×5): qty 180, 30d supply, fill #0
  Filled 2024-07-22: qty 180, 30d supply, fill #1

## 2024-06-04 ENCOUNTER — Other Ambulatory Visit: Payer: Self-pay

## 2024-06-06 ENCOUNTER — Ambulatory Visit: Payer: Self-pay | Admitting: Gastroenterology

## 2024-06-06 ENCOUNTER — Other Ambulatory Visit: Payer: Self-pay

## 2024-06-07 ENCOUNTER — Other Ambulatory Visit: Payer: Self-pay

## 2024-06-09 ENCOUNTER — Other Ambulatory Visit: Payer: Self-pay

## 2024-06-10 ENCOUNTER — Ambulatory Visit (INDEPENDENT_AMBULATORY_CARE_PROVIDER_SITE_OTHER)

## 2024-06-10 DIAGNOSIS — F1020 Alcohol dependence, uncomplicated: Secondary | ICD-10-CM

## 2024-06-10 DIAGNOSIS — F439 Reaction to severe stress, unspecified: Secondary | ICD-10-CM

## 2024-06-10 DIAGNOSIS — F411 Generalized anxiety disorder: Secondary | ICD-10-CM

## 2024-06-10 NOTE — Progress Notes (Signed)
 TTHERAPIST PROGRESS NOTE  Session Time: 1:00 pm to 1:45 pm.  Virtual Visit via Video Note   I connected with  Jeannia at  1:00 pm    EST by a video enabled telemedicine application and verified that I am speaking with the correct person using two identifiers.   Location: Patient: home Provider: [address] [place]   I discussed the limitations of evaluation and management by telemedicine and the availability of in person appointments. The patient expressed understanding and agreed to proceed.  Type of Therapy: Individual   Therapist Response/Interventions: psycho-education on addiction as a brain disease, discuss what is involved in developing a relapse plan, supports,  and being all in to have the best chances of recovery  Treatment Goals addressed:      Problem: Substance Use     Dates: Start:  12/23/23       Disciplines: Interdisciplinary, PROVIDER        Goal: Maricarmen will decrease her anxiety symptoms by reporting no higher than a 4 on the GAD-7.     Dates: Start:  12/23/23    Expected End:  06/23/24       Disciplines: Interdisciplinary, PROVIDER         Outcomes     Date/Time User Outcome    12/23/23 1346 Darice R Initial                  Goal: Clotilde will abstain from alcohol for 30/30 days per month based on self report and or breathalyzer if indicated.     Dates: Start:  12/23/23    Expected End:  06/23/24       Disciplines: Interdisciplinary, PROVIDER         Outcomes     Date/Time User Outcome    12/23/23 1346 Darice SAUNDERS Initial                  Intervention: Therapist will educate Lamira about SUDS patterns and consequences of use, relapse risk, the treatment process and types of mutual groups and provide early recovery and relapse prevention skills.     Dates: Start:  12/23/23                Intervention: Therapist will assist Melanie in identifying thoughts and behaviors that can contribute to feelings of anxiety.     Dates: Start:  12/23/23                Summary: Alayssa presents virtually today.  She says she has been able to go 4 days without drinking. She says she quit because she is getting severe migraines, has had a stomach ulcer and has concerns about her liver. She said she is going to get her liver enzymes checked.   Lenice asks how she can go to a family gathering where there will be drinking and not drink. Therapist discusses changing thing, people and places that are triggers. Therapist discusses that if she is serious and all in on her recovery, she would need to avoid those situations where people are drinking in her presence.  She says she does not know if she can do this.  Dreamer says she has seen progress in herself and thinks she only needs prn therapy. Therapist discusses how she is at a fragile time given she has only been 4 days sober and has not developed a relapse plan.  She says she is replacing the alcohol with drinking water with lemon and cocoa cola.  Joline discusses her fears at the possibility of losing medicaid. She says she and her husband's medicaid was denied but when she asked for it to be reviewed again, she was granted another year. She has her husband is getting cancer treatments and they could not afford medical care without medicaid.  Progress Towards Goals: progressing  Suicidal/Homicidal: denies  Plan: Return on Prn basis  Diagnosis: Alcohol Use Disorder, Moderate R/O Severe GAD  Collaboration of Care: N/A  Patient/Guardian was advised Release of Information must be obtained prior to any record release in order to collaborate their care with an outside provider. Patient/Guardian was advised if they have not already done so to contact the registration department to sign all necessary forms in order for us  to release information regarding their care.   Consent: Patient/Guardian gives verbal consent for treatment and assignment of benefits for services provided during this visit.  Patient/Guardian expressed understanding and agreed to proceed.   Darice Simpler, MS, LMFT, LCAS

## 2024-06-11 ENCOUNTER — Other Ambulatory Visit: Payer: Self-pay

## 2024-06-16 ENCOUNTER — Ambulatory Visit: Attending: Nurse Practitioner | Admitting: Nurse Practitioner

## 2024-06-16 ENCOUNTER — Encounter: Payer: Self-pay | Admitting: Nurse Practitioner

## 2024-06-16 ENCOUNTER — Other Ambulatory Visit: Payer: Self-pay

## 2024-06-16 VITALS — BP 160/92 | HR 73 | Temp 98.0°F | Resp 18 | Ht 61.0 in | Wt 140.8 lb

## 2024-06-16 DIAGNOSIS — R030 Elevated blood-pressure reading, without diagnosis of hypertension: Secondary | ICD-10-CM

## 2024-06-16 DIAGNOSIS — R748 Abnormal levels of other serum enzymes: Secondary | ICD-10-CM

## 2024-06-16 DIAGNOSIS — M62838 Other muscle spasm: Secondary | ICD-10-CM

## 2024-06-16 DIAGNOSIS — J069 Acute upper respiratory infection, unspecified: Secondary | ICD-10-CM | POA: Diagnosis not present

## 2024-06-16 DIAGNOSIS — Z23 Encounter for immunization: Secondary | ICD-10-CM | POA: Diagnosis not present

## 2024-06-16 DIAGNOSIS — E782 Mixed hyperlipidemia: Secondary | ICD-10-CM

## 2024-06-16 DIAGNOSIS — D72829 Elevated white blood cell count, unspecified: Secondary | ICD-10-CM

## 2024-06-16 DIAGNOSIS — F5101 Primary insomnia: Secondary | ICD-10-CM | POA: Diagnosis not present

## 2024-06-16 MED ORDER — CYCLOBENZAPRINE HCL 5 MG PO TABS
5.0000 mg | ORAL_TABLET | Freq: Three times a day (TID) | ORAL | 3 refills | Status: AC | PRN
  Filled 2024-06-16: qty 60, 20d supply, fill #0

## 2024-06-16 MED ORDER — DICLOFENAC SODIUM 1 % EX GEL
2.0000 g | Freq: Four times a day (QID) | CUTANEOUS | 3 refills | Status: AC
  Filled 2024-06-16: qty 200, 24d supply, fill #0

## 2024-06-16 MED ORDER — BENZONATATE 200 MG PO CAPS
200.0000 mg | ORAL_CAPSULE | Freq: Two times a day (BID) | ORAL | 1 refills | Status: AC | PRN
  Filled 2024-06-16: qty 20, 10d supply, fill #0
  Filled 2024-07-06: qty 20, 10d supply, fill #1

## 2024-06-16 MED ORDER — COVID-19 MRNA VAC-TRIS(PFIZER) 30 MCG/0.3ML IM SUSY
0.3000 mL | PREFILLED_SYRINGE | Freq: Once | INTRAMUSCULAR | 0 refills | Status: AC
  Filled 2024-06-16: qty 0.3, 1d supply, fill #0

## 2024-06-16 NOTE — Progress Notes (Unsigned)
 Requesting labs if needed.

## 2024-06-16 NOTE — Progress Notes (Unsigned)
 Assessment & Plan:  [name] was seen today for medication refill and cough.  Diagnoses and all orders for this visit:  URI with cough and congestion -     benzonatate  (TESSALON ) 200 MG capsule; Take 1 capsule (200 mg total) by mouth 2 (two) times daily as needed for cough.  Muscle spasms of neck -     cyclobenzaprine  (FLEXERIL ) 5 MG tablet; Take 1 tablet (5 mg total) by mouth 3 (three) times daily as needed for muscle spasms. -     diclofenac  Sodium (VOLTAREN ) 1 % GEL; Apply 2 g topically 4 (four) times daily.  Primary insomnia -     cyclobenzaprine  (FLEXERIL ) 5 MG tablet; Take 1 tablet (5 mg total) by mouth 3 (three) times daily as needed for muscle spasms.  Elevated liver enzymes -     CMP14+EGFR; Future  COVID-19 vaccine administered -     COVID-19 mRNA vaccine, Pfizer, (COMIRNATY) syringe; Inject 0.3 mLs into the muscle once for 1 dose.  Mixed hyperlipidemia -     Lipid panel; Future INSTRUCTIONS: Work on a low fat, heart healthy diet and participate in regular aerobic exercise program by working out at least 150 minutes per week; 5 days a week-30 minutes per day. Avoid red meat/beef/steak,  fried foods. junk foods, sodas, sugary drinks, unhealthy snacking, alcohol and smoking.  Drink at least 80 oz of water per day and monitor your carbohydrate intake daily.    Leukocytosis, unspecified type -     CBC with Differential; Future  Elevated blood pressure reading Log 2 weeks of BP readings and send via mychart   Patient has been counseled on age-appropriate routine health concerns for screening and prevention. These are reviewed and up-to-date. Referrals have been placed accordingly. Immunizations are up-to-date or declined.    Subjective:   Chief Complaint  Patient presents with   Medication Refill   Cough    Requesting tessalon  for cough. Symptoms started 06/14/24.    History of Present Illness Rhonda Graves is a 43 year old female with hypertension who presents with  elevated blood pressure readings and a cough.  She has a past medical history of Abnormal Pap smear, AMA multigravida 35+, second trimester (03/06/2017), Anxiety, Chronic hypertension in pregnancy (02/18/2014), Depressive disorder, Dysmenorrhea, Eczema, GERD (Dx 2010), Headache(784.0), History of cesarean delivery (03/06/2017), History of oral herpes simplex infection (05/29/2017), IBS, Migraine, Multiple allergies, Polyhydramnios affecting pregnancy in third trimester (11/25/2013), Status post repeat low transverse cesarean section (08/20/2017), Supervision of high risk pregnancy, antepartum (01/06/2017), and Urticaria.    HTN She has elevated blood pressure reading today.  Her blood pressures are persistently higher here in office. She does have her home monitor with an average diastolic in the 80s and systolic 130s. She has higher readings as well on her monitor but states the higher readings are related to when she is experienicing migraines.  BP Readings from Last 3 Encounters:  06/16/24 (!) 160/92  05/27/24 (!) 155/97  05/13/24 (!) 142/96     She has been experiencing a cough with congestion and drainage for the past two days. She uses Xyzal  daily and fluticasone  nasal spray once a day, especially when she feels sick or has allergies. She used her nasal spray this morning and has been using it consistently, except for a weekend when she went camping. No fever, chills. Mild sore throat.   She has a history of a stomach ulcer which she attributes to taking meloxicam  for a sprained toe. She avoids anti-inflammatories  due to this history but uses Voltaren  gel for muscle pain relief.    She is concerned about her glucose levels and wants to check for prediabetes. She also discusses her cholesterol levels, noting they have been slightly elevated in the past. She will return for fasting labs.   Review of Systems  Constitutional:  Negative for fever, malaise/fatigue and weight loss.  HENT:   Positive for congestion and sore throat. Negative for nosebleeds.   Eyes: Negative.  Negative for blurred vision, double vision and photophobia.  Respiratory:  Positive for cough. Negative for shortness of breath.   Cardiovascular: Negative.  Negative for chest pain, palpitations and leg swelling.  Gastrointestinal: Negative.  Negative for heartburn, nausea and vomiting.  Musculoskeletal: Negative.  Negative for myalgias.  Neurological: Negative.  Negative for dizziness, focal weakness, seizures and headaches.  Endo/Heme/Allergies:  Positive for environmental allergies.  Psychiatric/Behavioral: Negative.  Negative for suicidal ideas.     Past Medical History:  Diagnosis Date   Abnormal Pap smear    had colpo in past, normal pap since then   AMA (advanced maternal age) multigravida 35+, second trimester 03/06/2017   Seen by [facility]. Low risk panorama. Normal NT. afp neg     Anxiety    Chronic hypertension in pregnancy 02/18/2014   [ ]  Aspirin 81 mg daily after 12 weeks; discontinue after 36 weeks  Current antihypertensives:  none     Baseline and surveillance labs (pulled in from [facility], refresh links as needed)           Lab Results      Component    Value    Date           PLT    327    02/02/2016           CREATININE    0.64    02/02/2016           AST    28    02/02/2016           ALT    39 (H)    02/02/2016           PRO   Depressive disorder    Dysmenorrhea    Eczema    GERD (gastroesophageal reflux disease) Dx 2010   Headache(784.0)    migraine   History of cesarean delivery 03/06/2017   Desires TOLAC, consent signed 06/09/2017        History of oral herpes simplex infection 05/29/2017   +IgM 1/2 antibody screen done for cold sores vs aphthous ulcers   IBS (irritable bowel syndrome)    Migraine    Multiple allergies    Polyhydramnios affecting pregnancy in third trimester 11/25/2013   [ ]  consider 39wk IOL vs EDC IOL  Had G1 IOL due to poly  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     Status post  repeat low transverse cesarean section 08/20/2017   Supervision of high risk pregnancy, antepartum 01/06/2017            Clinic     [facility]    Prenatal Labs      Dating     LMP (IUI pregnancy)    Blood type: A/Positive/-- (04/16 1631) A pos      Genetic Screen    1 Screen: Neg   AFP:     Neg   Cffdna: low risk    Antibody:Negative (04/16 1631)neg      Anatomic US      Normal  Rubella: 4.57 (04/16 1631)imm      GTT    Third trimester: neg    RPR: Non Reactive (09/06 0836) neg      Flu vaccine     05/29/2017     Urticaria     Past Surgical History:  Procedure Laterality Date   CESAREAN SECTION N/A 11/26/2013   Procedure: CESAREAN SECTION;  Surgeon: Elveria Mungo, MD;  Location: [facility];  Service: Obstetrics;  Laterality: N/A;   CESAREAN SECTION N/A 08/17/2017   Procedure: CESAREAN SECTION;  Surgeon: Edsel Norleen GAILS, MD;  Location: [facility] SU[facility];  Service: Obstetrics;  Laterality: N/A;   COLONOSCOPY  07/11/2023   ESOPHAGOGASTRODUODENOSCOPY  05/23/2011   Mild gastritis. Otherwise normal EGD   LASER ABLATION Right 10/30/2023   Right Greater saphenous Vein with 10-20 stab phlebectomies    Family History  Problem Relation Age of Onset   Migraines Mother    Hypothyroidism Mother    Arthritis Mother    Allergic rhinitis Father    Asthma Brother    Allergic rhinitis Brother    Alzheimer's disease Maternal Grandmother    Colon cancer Paternal Grandmother    Breast cancer Neg Hx    Esophageal cancer Neg Hx    Stomach cancer Neg Hx    Rectal cancer Neg Hx     Social History Reviewed with no changes to be made today.   Outpatient Medications Prior to Visit  Medication Sig Dispense Refill   acamprosate  (CAMPRAL ) 333 MG tablet Take 2 tablets (666 mg total) by mouth 3 (three) times daily. 180 tablet 1   Acetaminophen  (TYLENOL  PO) Take 500 mg by mouth as needed.     co-enzyme Q-10 30 MG capsule Take 300 mg by mouth daily.     Dapsone  5 % topical gel Apply 1 Application topically  at bedtime. 60 g 2   EPINEPHrine  0.3 mg/0.3 mL IJ SOAJ injection Inject 0.3 mg into the muscle as needed. 2 each 1   esomeprazole  (NEXIUM ) 40 MG capsule Take 1 capsule (40 mg total) by mouth daily FOR 12 WEEKS. 90 capsule 1   Fremanezumab -vfrm (AJOVY ) 225 MG/1.5ML SOAJ Inject 225 mg into the skin every 28 (twenty-eight) days. 1.5 mL 11   levocetirizine (XYZAL ) 5 MG tablet Take 1 tablet by mouth 1 time daily. May take an additional tablet as needed for itching/hives flare. 30 tablet 5   Magnesium  400 MG CAPS Take by mouth.     Melatonin 10 MG TABS Take 1 tablet by mouth daily. 90 tablet 1   naltrexone  (DEPADE) 50 MG tablet Take 2 tablets (100 mg total) by mouth at bedtime. 120 tablet 0   Ubrogepant  (UBRELVY ) 100 MG TABS Take 1 tablet (100 mg total) by mouth as needed. May repeat after 2 hours.  Maximum 2 tablets in 24 hours. 16 tablet 5   cyclobenzaprine  (FLEXERIL ) 5 MG tablet Take 1 tablet (5 mg total) by mouth 3 (three) times daily as needed for muscle spasms. 60 tablet 3   diclofenac  Sodium (VOLTAREN ) 1 % GEL Apply 2 g topically 4 (four) times daily. 200 g 1   dicyclomine  (BENTYL ) 10 MG capsule Take 1 capsule (10 mg total) by mouth 4 (four) times daily -  before meals and at bedtime. (Patient not taking: Reported on 06/16/2024) 90 capsule 0   No facility-administered medications prior to visit.    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 Uncertain what caused reaction.   Lomotil  [Diphenoxylate ] Itching and Swelling    Had allergic reaction after Lomotil , Lysteda  and Hemabate . Uncertain what caused reaction.   Lysteda  [Tranexamic Acid ] Itching and Swelling    Had allergic reaction after Lomotil , Lysteda  and Hemabate . Uncertain what caused reaction.       Objective:    BP  (!) 160/92 (BP Location: Left Arm, Patient Position: Sitting, Cuff Size: Normal)   Pulse 73   Temp 98 F (36.7 C) (Oral)   Resp 18   Ht 5' 1 (1.549 m)   Wt 140 lb 12.8 oz (63.9 kg)   SpO2 100%   BMI 26.60 kg/m  Wt Readings from Last 3 Encounters:  06/16/24 140 lb 12.8 oz (63.9 kg)  05/27/24 139 lb (63 kg)  05/13/24 141 lb (64 kg)    Physical Exam Vitals and nursing note reviewed.  Constitutional:      Appearance: She is well-developed.  HENT:     Head: Normocephalic and atraumatic.     Mouth/Throat:     Pharynx: Posterior oropharyngeal erythema and postnasal drip present.  Cardiovascular:     Rate and Rhythm: Normal rate and regular rhythm.     Heart sounds: Normal heart sounds. No murmur heard.    No friction rub. No gallop.  Pulmonary:     Effort: Pulmonary effort is normal. No tachypnea or respiratory distress.     Breath sounds: Normal breath sounds. No decreased breath sounds, wheezing, rhonchi or rales.  Chest:     Chest wall: No tenderness.  Musculoskeletal:        General: Normal range of motion.     Cervical back: Normal range of motion.  Skin:    General: Skin is warm and dry.  Neurological:     Mental Status: She is alert and oriented to person, place, and time.     Coordination: Coordination normal.  Psychiatric:        Behavior: Behavior normal. Behavior is cooperative.        Thought Content: Thought content normal.        Judgment: Judgment normal.          Patient has been counseled extensively about nutrition and exercise as well as the importance of adherence with medications and regular follow-up. The patient was given clear instructions to go to ER or return to medical center if symptoms don't improve, worsen or new problems develop. The patient verbalized understanding.   Follow-up: Return if symptoms worsen or fail to improve.   Haze LELON Servant, FNP-BC [facility] [place], [place] [phone]    06/17/2024, 8:43 AM

## 2024-06-17 ENCOUNTER — Telehealth (HOSPITAL_COMMUNITY): Payer: Self-pay

## 2024-06-17 ENCOUNTER — Encounter: Payer: Self-pay | Admitting: Nurse Practitioner

## 2024-06-17 ENCOUNTER — Other Ambulatory Visit: Payer: Self-pay

## 2024-06-17 NOTE — Telephone Encounter (Signed)
 Rhonda Graves had asked this therapist to send her information on Baclofen as she would like to be prescribed Baclofen for her cravings with her alcohol use disorder.  [email or web address]  Hey Carlea  This is the video we share with our intensive outpatient substance use group. Carlin Emmer, PA-C who prescribes Baclofen for our patients who have cravings in their alcohol or cocaine use disorders has asked us  to share it with the group.  This video discusses baclofen treating cocaine cravings, specifically.  You had mentioned you would like your provider to prescribe baclofen for your alcohol cravings, I would suggest if your provider has questions about using Baclofen, Charles Kober, PA-C who is the prescriber for our intensive outpatient substance use group would be a great resource.    Should you ever be interested in engaging in therapy, please don't hesitate to contact me.  Darice Simpler, MS, LMFT, LCAS

## 2024-06-18 ENCOUNTER — Ambulatory Visit: Attending: Nurse Practitioner

## 2024-06-18 DIAGNOSIS — R748 Abnormal levels of other serum enzymes: Secondary | ICD-10-CM

## 2024-06-18 DIAGNOSIS — D72829 Elevated white blood cell count, unspecified: Secondary | ICD-10-CM | POA: Diagnosis not present

## 2024-06-18 DIAGNOSIS — E782 Mixed hyperlipidemia: Secondary | ICD-10-CM

## 2024-06-19 LAB — CBC WITH DIFFERENTIAL/PLATELET
Basophils Absolute: 0.1 x10E3/uL (ref 0.0–0.2)
Basos: 1 %
EOS (ABSOLUTE): 0.1 x10E3/uL (ref 0.0–0.4)
Eos: 3 %
Hematocrit: 38.9 % (ref 34.0–46.6)
Hemoglobin: 12.6 g/dL (ref 11.1–15.9)
Immature Grans (Abs): 0 x10E3/uL (ref 0.0–0.1)
Immature Granulocytes: 0 %
Lymphocytes Absolute: 1.5 x10E3/uL (ref 0.7–3.1)
Lymphs: 30 %
MCH: 31.7 pg (ref 26.6–33.0)
MCHC: 32.4 g/dL (ref 31.5–35.7)
MCV: 98 fL — ABNORMAL HIGH (ref 79–97)
Monocytes Absolute: 0.5 x10E3/uL (ref 0.1–0.9)
Monocytes: 9 %
Neutrophils Absolute: 3 x10E3/uL (ref 1.4–7.0)
Neutrophils: 57 %
Platelets: 263 x10E3/uL (ref 150–450)
RBC: 3.98 x10E6/uL (ref 3.77–5.28)
RDW: 11.9 % (ref 11.7–15.4)
WBC: 5.2 x10E3/uL (ref 3.4–10.8)

## 2024-06-19 LAB — CMP14+EGFR
ALT: 38 IU/L — ABNORMAL HIGH (ref 0–32)
AST: 36 IU/L (ref 0–40)
Albumin: 4.5 g/dL (ref 3.9–4.9)
Alkaline Phosphatase: 87 IU/L (ref 41–116)
BUN/Creatinine Ratio: 15 (ref 9–23)
BUN: 11 mg/dL (ref 6–24)
Bilirubin Total: 0.4 mg/dL (ref 0.0–1.2)
CO2: 21 mmol/L (ref 20–29)
Calcium: 9.5 mg/dL (ref 8.7–10.2)
Chloride: 103 mmol/L (ref 96–106)
Creatinine, Ser: 0.75 mg/dL (ref 0.57–1.00)
Globulin, Total: 2.5 g/dL (ref 1.5–4.5)
Glucose: 92 mg/dL (ref 70–99)
Potassium: 4.7 mmol/L (ref 3.5–5.2)
Sodium: 139 mmol/L (ref 134–144)
Total Protein: 7 g/dL (ref 6.0–8.5)
eGFR: 102 mL/min/1.73 (ref >59)

## 2024-06-19 LAB — LIPID PANEL
Chol/HDL Ratio: 2.3 ratio (ref 0.0–4.4)
Cholesterol, Total: 211 mg/dL — ABNORMAL HIGH (ref 100–199)
HDL: 93 mg/dL (ref >39)
LDL Chol Calc (NIH): 109 mg/dL — ABNORMAL HIGH (ref 0–99)
Triglycerides: 49 mg/dL (ref 0–149)
VLDL Cholesterol Cal: 9 mg/dL (ref 5–40)

## 2024-06-21 ENCOUNTER — Other Ambulatory Visit: Payer: Self-pay

## 2024-06-22 ENCOUNTER — Other Ambulatory Visit: Payer: Self-pay

## 2024-06-23 ENCOUNTER — Ambulatory Visit: Payer: Self-pay | Admitting: Nurse Practitioner

## 2024-06-29 ENCOUNTER — Ambulatory Visit: Payer: Self-pay | Admitting: Plastic Surgery

## 2024-06-29 ENCOUNTER — Other Ambulatory Visit: Payer: Self-pay | Admitting: Plastic Surgery

## 2024-06-29 ENCOUNTER — Telehealth (HOSPITAL_COMMUNITY): Payer: Self-pay | Admitting: Psychiatry

## 2024-06-29 ENCOUNTER — Encounter: Payer: Self-pay | Admitting: Plastic Surgery

## 2024-06-29 VITALS — BP 122/81 | HR 72

## 2024-06-29 DIAGNOSIS — L719 Rosacea, unspecified: Secondary | ICD-10-CM

## 2024-06-29 NOTE — Progress Notes (Signed)
 Preoperative Dx: Pigmentation and rosacea of face  Postoperative Dx:  same  Procedure: laser to face  Anesthesia: none  Description of Procedure:  Risks and complications were explained to the patient. Consent was confirmed and signed. Eye protection was placed. Time out was called and all information was confirmed to be correct. The area  area was prepped with alcohol and wiped dry. The BBL laser was set at 515 and 560 at preset joules J/cm2. The face was lasered.  Targets were rosacea and pigment.  The patient tolerated the procedure well and there were no complications. The patient is to follow up in 4 weeks.

## 2024-06-29 NOTE — Telephone Encounter (Signed)
 Patient called this morning and LVM at 9:25 am to speak with Dr. Izella. Patient has questions about how to take her medication. Callback number is [phone].

## 2024-07-05 ENCOUNTER — Encounter: Payer: Self-pay | Admitting: Nurse Practitioner

## 2024-07-05 ENCOUNTER — Other Ambulatory Visit: Payer: Self-pay | Admitting: Nurse Practitioner

## 2024-07-05 ENCOUNTER — Ambulatory Visit
Admission: RE | Admit: 2024-07-05 | Discharge: 2024-07-05 | Disposition: A | Discharge status: Home / Self Care | Source: Ambulatory Visit | Attending: Physician Assistant | Admitting: Physician Assistant

## 2024-07-05 ENCOUNTER — Encounter: Payer: Self-pay | Admitting: Dermatology

## 2024-07-05 DIAGNOSIS — Z1231 Encounter for screening mammogram for malignant neoplasm of breast: Secondary | ICD-10-CM

## 2024-07-05 NOTE — Telephone Encounter (Signed)
 It should be ok to use but the patient should reach out to Dr Lowery to confirm how days a week she should use the Sente Lightening Cream and how she wants it incorporated with her current topical rosacea therapy.  -Dr Alm

## 2024-07-06 ENCOUNTER — Ambulatory Visit: Payer: Self-pay | Admitting: Nurse Practitioner

## 2024-07-06 ENCOUNTER — Other Ambulatory Visit: Payer: Self-pay

## 2024-07-06 ENCOUNTER — Ambulatory Visit
Admission: EM | Admit: 2024-07-06 | Discharge: 2024-07-06 | Disposition: A | Discharge status: Home / Self Care | Attending: Family Medicine | Admitting: Family Medicine

## 2024-07-06 ENCOUNTER — Ambulatory Visit (INDEPENDENT_AMBULATORY_CARE_PROVIDER_SITE_OTHER)

## 2024-07-06 DIAGNOSIS — R051 Acute cough: Secondary | ICD-10-CM

## 2024-07-06 DIAGNOSIS — J22 Unspecified acute lower respiratory infection: Secondary | ICD-10-CM | POA: Diagnosis not present

## 2024-07-06 MED ORDER — PREDNISONE 20 MG PO TABS
40.0000 mg | ORAL_TABLET | Freq: Every day | ORAL | 0 refills | Status: AC
  Filled 2024-07-06: qty 10, 5d supply, fill #0

## 2024-07-06 MED ORDER — PROMETHAZINE-DM 6.25-15 MG/5ML PO SYRP
5.0000 mL | ORAL_SOLUTION | Freq: Four times a day (QID) | ORAL | 0 refills | Status: DC | PRN
  Filled 2024-07-06: qty 118, 6d supply, fill #0

## 2024-07-06 MED ORDER — AZITHROMYCIN 250 MG PO TABS
250.0000 mg | ORAL_TABLET | Freq: Every day | ORAL | 0 refills | Status: DC
  Filled 2024-07-06: qty 6, 5d supply, fill #0

## 2024-07-06 NOTE — Discharge Instructions (Addendum)
 Start azithromycin  antibiotic as prescribed.  You may take Promethazine  DM as needed for cough.  Please note this medication will make you drowsy.  Do not drink alcohol or drive while on this medication.  Prednisone  daily for 5 days.  Lots of rest and fluids.  Please follow-up with your PCP if your symptoms are not improving.  Please go to the ER if you develop any worsening symptoms.  I hope you feel better soon!

## 2024-07-06 NOTE — ED Provider Notes (Signed)
 [facility]    CSN: [id number] Arrival date & time: 07/06/24  1137      History   Chief Complaint Chief Complaint  Patient presents with   Cough    HPI Rhonda Graves is a 43 y.o. female  presents for evaluation of URI symptoms for 3 weeks. Patient reports associated symptoms of dry cough with some congestion and chest pain with coughing. Denies N/V/D, sore throat, fevers, ear pain, body aches, shortness of breath. Patient does not have a hx of asthma. Patient is not an active smoker.   Reports no known sick contacts.  Pt has taken Mucinex, Flonase  and Tessalon  for symptoms. Pt has no other concerns at this time.    Cough   Past Medical History:  Diagnosis Date   Abnormal Pap smear    had colpo in past, normal pap since then   AMA (advanced maternal age) multigravida 35+, second trimester 03/06/2017   Seen by [facility]. Low risk panorama. Normal NT. afp neg     Anxiety    Chronic hypertension in pregnancy 02/18/2014   [ ]  Aspirin 81 mg daily after 12 weeks; discontinue after 36 weeks  Current antihypertensives:  none     Baseline and surveillance labs (pulled in from [facility], refresh links as needed)           Lab Results      Component    Value    Date           PLT    327    02/02/2016           CREATININE    0.64    02/02/2016           AST    28    02/02/2016           ALT    39 (H)    02/02/2016           PRO   Depressive disorder    Dysmenorrhea    Eczema    GERD (gastroesophageal reflux disease) Dx 2010   Headache(784.0)    migraine   History of cesarean delivery 03/06/2017   Desires TOLAC, consent signed 06/09/2017        History of oral herpes simplex infection 05/29/2017   +IgM 1/2 antibody screen done for cold sores vs aphthous ulcers   IBS (irritable bowel syndrome)    Migraine    Multiple allergies    Polyhydramnios affecting pregnancy in third trimester 11/25/2013   [ ]  consider 39wk IOL vs EDC IOL  Had G1 IOL due to poly  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     Status  post repeat low transverse cesarean section 08/20/2017   Supervision of high risk pregnancy, antepartum 01/06/2017            Clinic     [facility]    Prenatal Labs      Dating     LMP (IUI pregnancy)    Blood type: A/Positive/-- (04/16 1631) A pos      Genetic Screen    1 Screen: Neg   AFP:     Neg   Cffdna: low risk    Antibody:Negative (04/16 1631)neg      Anatomic US      Normal    Rubella: 4.57 (04/16 1631)imm      GTT    Third trimester: neg    RPR: Non Reactive (09/06 0836) neg      Flu vaccine  05/29/2017     Urticaria     Patient Active Problem List   Diagnosis Date Noted   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   Urticaria    Alcohol use disorder, moderate, dependence (HCC) 11/18/2023   Generalized anxiety disorder 11/18/2023   Encounter for counseling 05/13/2023   Rosacea 02/11/2023   Muscle spasms of neck 07/30/2017   Edema of right lower extremity 05/29/2017   History of cesarean delivery 03/06/2017   Sinus bradycardia 03/06/2017   Environmental allergies 02/02/2016   Nevus of multiple sites of trunk 09/08/2014   Migraine 02/18/2014   IBS (irritable bowel syndrome) 04/08/2013    Past Surgical History:  Procedure Laterality Date   CESAREAN SECTION N/A 11/26/2013   Procedure: CESAREAN SECTION;  Surgeon: Elveria Mungo, MD;  Location: [facility];  Service: Obstetrics;  Laterality: N/A;   CESAREAN SECTION N/A 08/17/2017   Procedure: CESAREAN SECTION;  Surgeon: Edsel Norleen GAILS, MD;  Location: [facility];  Service: Obstetrics;  Laterality: N/A;   COLONOSCOPY  07/11/2023   ESOPHAGOGASTRODUODENOSCOPY  05/23/2011   Mild gastritis. Otherwise normal EGD   LASER ABLATION Right 10/30/2023   Right Greater saphenous Vein with 10-20 stab phlebectomies    OB History     Gravida  2   Para  2   Term  2   Preterm      AB      Living  2      SAB      IAB      Ectopic      Multiple  0   Live Births  2             Home Medications    Prior to Admission medications   Medication Sig Start Date End Date Taking? Authorizing Provider  azithromycin  (ZITHROMAX ) 250 MG tablet Take 1 tablet (250 mg total) by mouth daily. Take first 2 tablets together, then 1 every day until finished. 07/06/24  Yes Mitzie Marlar, Jodi R, NP  predniSONE  (DELTASONE ) 20 MG tablet Take 2 tablets (40 mg total) by mouth daily with breakfast for 5 days. 07/06/24 07/11/24 Yes Ajene Carchi, Jodi R, NP  promethazine -dextromethorphan (PROMETHAZINE -DM) 6.25-15 MG/5ML syrup Take 5 mLs by mouth 4 (four) times daily as needed for cough. 07/06/24  Yes Darryll Raju, Jodi R, NP  acamprosate  (CAMPRAL ) 333 MG tablet Take 2 tablets (666 mg total) by mouth 3 (three) times daily. 06/03/24   Izella Ismael NOVAK, MD  Acetaminophen  (TYLENOL  PO) Take 500 mg by mouth as needed.    [provider]  benzonatate  (TESSALON ) 200 MG capsule Take 1 capsule (200 mg total) by mouth 2 (two) times daily as needed for cough. 06/16/24   Fleming, Zelda W, NP  co-enzyme Q-10 30 MG capsule Take 300 mg by mouth daily.    [provider]  cyclobenzaprine  (FLEXERIL ) 5 MG tablet Take 1 tablet (5 mg total) by mouth 3 (three) times daily as needed for muscle spasms. 06/16/24   Fleming, Zelda W, NP  Dapsone  5 % topical gel Apply 1 Application topically at bedtime. 04/28/24   Alm Delon SAILOR, DO  diclofenac  Sodium (VOLTAREN ) 1 % GEL Apply 2 g topically 4 (four) times daily. 06/16/24   Fleming, Zelda W, NP  dicyclomine  (BENTYL ) 10 MG capsule Take 1 capsule (10 mg total) by mouth 4 (four) times daily -  before meals and at bedtime. 05/31/24   May, Deanna J, NP  EPINEPHrine  0.3 mg/0.3 mL IJ SOAJ  injection Inject 0.3 mg into the muscle as needed. 10/02/23   Jeneal Danita Macintosh, MD  esomeprazole  (NEXIUM ) 40 MG capsule Take 1 capsule (40 mg total) by mouth daily FOR 12 WEEKS. 05/13/24   Charlanne Groom, MD  Fremanezumab -vfrm (AJOVY ) 225 MG/1.5ML SOAJ Inject 225 mg into the skin every 28  (twenty-eight) days. 05/27/24   Skeet Juliene SAUNDERS, DO  levocetirizine (XYZAL ) 5 MG tablet Take 1 tablet by mouth 1 time daily. May take an additional tablet as needed for itching/hives flare. 03/31/24   Jeneal Danita Macintosh, MD  Magnesium  400 MG CAPS Take by mouth.    [provider]  Melatonin 10 MG TABS Take 1 tablet by mouth daily. 05/13/23   Theotis Haze ORN, NP  naltrexone  (DEPADE) 50 MG tablet Take 2 tablets (100 mg total) by mouth at bedtime. 06/03/24   Izella Ismael NOVAK, MD  Ubrogepant  (UBRELVY ) 100 MG TABS Take 1 tablet (100 mg total) by mouth as needed. May repeat after 2 hours.  Maximum 2 tablets in 24 hours. 01/12/24   Skeet Juliene SAUNDERS, DO    Family History Family History  Problem Relation Age of Onset   Migraines Mother    Hypothyroidism Mother    Arthritis Mother    Allergic rhinitis Father    Asthma Brother    Allergic rhinitis Brother    Alzheimer's disease Maternal Grandmother    Colon cancer Paternal Grandmother    Breast cancer Neg Hx    Esophageal cancer Neg Hx    Stomach cancer Neg Hx    Rectal cancer Neg Hx     Social History Social History   Tobacco Use   Smoking status: Never    Passive exposure: Never   Smokeless tobacco: Never  Vaping Use   Vaping status: Never Used  Substance Use Topics   Alcohol use: Yes    Comment: occasionally    Drug use: No     Allergies   Other, Peach [prunus persica], Peanut -containing drug products, Hemabate  [carboprost  tromethamine ], Lomotil  [diphenoxylate ], and Lysteda  [tranexamic acid ]   Review of Systems Review of Systems  HENT:  Positive for congestion.   Respiratory:  Positive for cough.      Physical Exam Triage Vital Signs ED Triage Vitals  Encounter Vitals Group     BP 07/06/24 1158 (!) 132/90     Girls Systolic BP Percentile --      Girls Diastolic BP Percentile --      Boys Systolic BP Percentile --      Boys Diastolic BP Percentile --      Pulse Rate 07/06/24 1158 62     Resp 07/06/24 1158 17      Temp 07/06/24 1158 98.9 F (37.2 C)     Temp Source 07/06/24 1158 Oral     SpO2 07/06/24 1158 99 %     Weight --      Height --      Head Circumference --      Peak Flow --      Pain Score 07/06/24 1156 4     Pain Loc --      Pain Education --      Exclude from Growth Chart --    No data found.  Updated Vital Signs BP (!) 132/90   Pulse 62   Temp 98.9 F (37.2 C) (Oral)   Resp 17   LMP 06/14/2024   SpO2 99%   Visual Acuity Right Eye Distance:   Left Eye Distance:  Bilateral Distance:    Right Eye Near:   Left Eye Near:    Bilateral Near:     Physical Exam Vitals and nursing note reviewed.  Constitutional:      General: She is not in acute distress.    Appearance: She is well-developed. She is not ill-appearing.  HENT:     Head: Normocephalic and atraumatic.     Right Ear: Tympanic membrane and ear canal normal.     Left Ear: Tympanic membrane and ear canal normal.     Nose: Congestion present.     Mouth/Throat:     Mouth: Mucous membranes are moist.     Pharynx: Oropharynx is clear. Uvula midline. No oropharyngeal exudate or posterior oropharyngeal erythema.     Tonsils: No tonsillar exudate or tonsillar abscesses.  Eyes:     Conjunctiva/sclera: Conjunctivae normal.     Pupils: Pupils are equal, round, and reactive to light.  Cardiovascular:     Rate and Rhythm: Normal rate and regular rhythm.     Heart sounds: Normal heart sounds.  Pulmonary:     Effort: Pulmonary effort is normal.     Breath sounds: Normal breath sounds. No stridor. No wheezing, rhonchi or rales.  Musculoskeletal:     Cervical back: Normal range of motion and neck supple.  Lymphadenopathy:     Cervical: No cervical adenopathy.  Skin:    General: Skin is warm and dry.  Neurological:     General: No focal deficit present.     Mental Status: She is alert and oriented to person, place, and time.  Psychiatric:        Mood and Affect: Mood normal.        Behavior: Behavior normal.       UC Treatments / Results  Labs (all labs ordered are listed, but only abnormal results are displayed) Labs Reviewed - No data to display  EKG   Radiology No results found.  Procedures Procedures (including critical care time)  Medications Ordered in UC Medications - No data to display  Initial Impression / Assessment and Plan / UC Course  I have reviewed the triage vital signs and the nursing notes.  Pertinent labs & imaging results that were available during my care of the patient were reviewed by me and considered in my medical decision making (see chart for details).     Reviewed exam and symptoms with patient.  No red flags.  Wet read of x-ray without obvious consolidation, will contact for any positive results based on radiology overread once available.  Will treat for bronchitis with azithromycin , prednisone  and Promethazine  DM.  Encouraged rest fluids and PCP follow-up if symptoms do not improve.  ER precautions reviewed. Final Clinical Impressions(s) / UC Diagnoses   Final diagnoses:  Acute cough  Lower respiratory infection (e.g., bronchitis, pneumonia, pneumonitis, pulmonitis)     Discharge Instructions      Start azithromycin  antibiotic as prescribed.  You may take Promethazine  DM as needed for cough.  Please note this medication will make you drowsy.  Do not drink alcohol or drive while on this medication.  Prednisone  daily for 5 days.  Lots of rest and fluids.  Please follow-up with your PCP if your symptoms are not improving.  Please go to the ER if you develop any worsening symptoms.  I hope you feel better soon!     ED Prescriptions     Medication Sig Dispense Auth. Provider   azithromycin  (ZITHROMAX ) 250 MG tablet Take 1  tablet (250 mg total) by mouth daily. Take first 2 tablets together, then 1 every day until finished. 6 tablet Solstice Lastinger, Jodi R, NP   promethazine -dextromethorphan (PROMETHAZINE -DM) 6.25-15 MG/5ML syrup Take 5 mLs by mouth 4 (four)  times daily as needed for cough. 118 mL Laiken Sandy, Jodi R, NP   predniSONE  (DELTASONE ) 20 MG tablet Take 2 tablets (40 mg total) by mouth daily with breakfast for 5 days. 10 tablet Farren Nelles, Jodi R, NP      PDMP not reviewed this encounter.   Loreda Myla SAUNDERS, NP 07/06/24 [phone]

## 2024-07-06 NOTE — ED Triage Notes (Signed)
 Pt c/o moist coughx3wks. Pt c/o chest painx2wks. Pt states she sed tessalon  pearls, mucinex and flonase , but nothing is helping.

## 2024-07-08 ENCOUNTER — Other Ambulatory Visit: Payer: Self-pay

## 2024-07-08 ENCOUNTER — Telehealth (HOSPITAL_COMMUNITY): Payer: Self-pay

## 2024-07-08 ENCOUNTER — Ambulatory Visit: Payer: Self-pay | Admitting: Obstetrics and Gynecology

## 2024-07-08 ENCOUNTER — Other Ambulatory Visit (HOSPITAL_COMMUNITY): Payer: Self-pay | Admitting: Psychiatry

## 2024-07-08 DIAGNOSIS — F1020 Alcohol dependence, uncomplicated: Secondary | ICD-10-CM

## 2024-07-08 DIAGNOSIS — F19982 Other psychoactive substance use, unspecified with psychoactive substance-induced sleep disorder: Secondary | ICD-10-CM

## 2024-07-08 MED ORDER — TRAZODONE HCL 50 MG PO TABS
50.0000 mg | ORAL_TABLET | Freq: Every day | ORAL | 0 refills | Status: DC
  Filled 2024-07-08: qty 10, 10d supply, fill #0

## 2024-07-08 NOTE — Progress Notes (Signed)
 Spoke with patient regarding needing aid with sleep while she takes prednisone . I will prescribe 10 day supply of trazodone  50 mg nightly PRN to aid with sleep. Also she is having difficulty taking her afternoon Campral  so we discussed going back from TID to BID.  Ismael Franco, MD PGY-3 Psychiatry Resident

## 2024-07-08 NOTE — Telephone Encounter (Signed)
 Patient called in with questions for the provider. Patein is requesting to 3 tablets of campral  in morning and 3 in the evening. Per patient, she is having difficulty keeping up with afternoon dose.    Patient is currently on prednisone  to due an infection. Patient is taking melatonin and it is not working. Patient is requesting medication for sleep temporarily.

## 2024-07-12 ENCOUNTER — Other Ambulatory Visit (HOSPITAL_COMMUNITY): Payer: Self-pay

## 2024-07-12 ENCOUNTER — Ambulatory Visit: Admitting: Podiatry

## 2024-07-12 ENCOUNTER — Encounter: Payer: Self-pay | Admitting: Podiatry

## 2024-07-12 ENCOUNTER — Ambulatory Visit (INDEPENDENT_AMBULATORY_CARE_PROVIDER_SITE_OTHER)

## 2024-07-12 ENCOUNTER — Ambulatory Visit: Attending: Family Medicine

## 2024-07-12 VITALS — Ht 61.0 in | Wt 140.8 lb

## 2024-07-12 DIAGNOSIS — M65972 Unspecified synovitis and tenosynovitis, left ankle and foot: Secondary | ICD-10-CM | POA: Diagnosis not present

## 2024-07-12 DIAGNOSIS — Z23 Encounter for immunization: Secondary | ICD-10-CM | POA: Diagnosis not present

## 2024-07-12 DIAGNOSIS — S93509A Unspecified sprain of unspecified toe(s), initial encounter: Secondary | ICD-10-CM | POA: Diagnosis not present

## 2024-07-12 MED ORDER — BETAMETHASONE SOD PHOS & ACET 6 (3-3) MG/ML IJ SUSP
3.0000 mg | Freq: Once | INTRAMUSCULAR | Status: AC
  Administered 2024-07-12: 3 mg via INTRA_ARTICULAR

## 2024-07-12 NOTE — Progress Notes (Signed)
 Chief Complaint  Patient presents with   Toe Pain    Pt is here due to left great toe pain, states she sprain the toe in August, was seen here with Dt Lamount and never came back for f/u visit states she doesn't think the has healed completely.     HPI: 43 y.o. female presenting today for follow-up evaluation after an injury to the left great toe back in August.  She was seen by Dr. Lamount here in our office for left great toe sprain on 04/23/2024.  There has been some improvement but she continues to have some pain and tenderness depending on her activity.  She had to discontinue the meloxicam  because it caused a stomach ulcer.  Past Medical History:  Diagnosis Date   Abnormal Pap smear    had colpo in past, normal pap since then   AMA (advanced maternal age) multigravida 35+, second trimester 03/06/2017   Seen by [facility]. Low risk panorama. Normal NT. afp neg     Anxiety    Chronic hypertension in pregnancy 02/18/2014   [ ]  Aspirin 81 mg daily after 12 weeks; discontinue after 36 weeks  Current antihypertensives:  none     Baseline and surveillance labs (pulled in from [facility], refresh links as needed)           Lab Results      Component    Value    Date           PLT    327    02/02/2016           CREATININE    0.64    02/02/2016           AST    28    02/02/2016           ALT    39 (H)    02/02/2016           PRO   Depressive disorder    Dysmenorrhea    Eczema    GERD (gastroesophageal reflux disease) Dx 2010   Headache(784.0)    migraine   History of cesarean delivery 03/06/2017   Desires TOLAC, consent signed 06/09/2017        History of oral herpes simplex infection 05/29/2017   +IgM 1/2 antibody screen done for cold sores vs aphthous ulcers   IBS (irritable bowel syndrome)    Migraine    Multiple allergies    Polyhydramnios affecting pregnancy in third trimester 11/25/2013   [ ]  consider 39wk IOL vs EDC IOL  Had G1 IOL due to poly  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     Status post repeat low  transverse cesarean section 08/20/2017   Supervision of high risk pregnancy, antepartum 01/06/2017            Clinic     [facility]    Prenatal Labs      Dating     LMP (IUI pregnancy)    Blood type: A/Positive/-- (04/16 1631) A pos      Genetic Screen    1 Screen: Neg   AFP:     Neg   Cffdna: low risk    Antibody:Negative (04/16 1631)neg      Anatomic US      Normal    Rubella: 4.57 (04/16 1631)imm      GTT    Third trimester: neg    RPR: Non Reactive (09/06 0836) neg      Flu vaccine  05/29/2017     Urticaria     Past Surgical History:  Procedure Laterality Date   CESAREAN SECTION N/A 11/26/2013   Procedure: CESAREAN SECTION;  Surgeon: Elveria Mungo, MD;  Location: [facility];  Service: Obstetrics;  Laterality: N/A;   CESAREAN SECTION N/A 08/17/2017   Procedure: CESAREAN SECTION;  Surgeon: Edsel Norleen GAILS, MD;  Location: [facility];  Service: Obstetrics;  Laterality: N/A;   COLONOSCOPY  07/11/2023   ESOPHAGOGASTRODUODENOSCOPY  05/23/2011   Mild gastritis. 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 Uncertain what caused reaction.   Lomotil  [Diphenoxylate ] Itching and Swelling    Had allergic reaction after Lomotil , Lysteda  and Hemabate . Uncertain what caused reaction.   Lysteda  [Tranexamic Acid ] Itching and Swelling    Had allergic reaction after Lomotil , Lysteda  and Hemabate . Uncertain what caused reaction.     Physical Exam: General: The patient is alert and oriented x3 in no acute distress.  Dermatology: Skin is warm, dry and supple bilateral lower extremities.   Vascular: Palpable pedal pulses bilaterally. Capillary refill within  normal limits.  No appreciable edema.  No erythema.  Neurological: Grossly intact via light touch  Musculoskeletal Exam: Continued pain with palpation and range of motion of the IPJ of the left great toe.  No crepitus.  Radiographic Exam LT foot 07/12/2024:  Normal osseous mineralization. Joint spaces preserved.  No fractures or osseous irregularities noted.  Impression: Negative  Assessment/Plan of Care: 1.  Capsulitis left great toe  -Patient evaluated.  X-rays reviewed -Injection of 0.5 cc Celestone Soluspan injected into the IPJ left great toe -Discontinue NSAIDs. -Continue wearing good supportive tennis shoes and sneakers. -Return to clinic PRN  *Translator for [facility]     Thresa EMERSON Sar, DPM [facility]  Dr. Thresa EMERSON Sar, DPM    [address] [place], [place] [address]                Office [phone]  Fax [phone]
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
## 2024-07-12 NOTE — Progress Notes (Signed)
Flu vaccine administered per protocols.  Information sheet given. Patient denies and pain or discomfort at injection site. Tolerated injection well no reaction.  

## 2024-07-20 ENCOUNTER — Other Ambulatory Visit: Payer: Self-pay

## 2024-07-21 ENCOUNTER — Other Ambulatory Visit: Payer: Self-pay

## 2024-07-22 ENCOUNTER — Other Ambulatory Visit: Payer: Self-pay

## 2024-07-22 ENCOUNTER — Telehealth (INDEPENDENT_AMBULATORY_CARE_PROVIDER_SITE_OTHER): Admitting: Psychiatry

## 2024-07-22 DIAGNOSIS — F411 Generalized anxiety disorder: Secondary | ICD-10-CM

## 2024-07-22 DIAGNOSIS — G4700 Insomnia, unspecified: Secondary | ICD-10-CM

## 2024-07-22 DIAGNOSIS — F10982 Alcohol use, unspecified with alcohol-induced sleep disorder: Secondary | ICD-10-CM

## 2024-07-22 DIAGNOSIS — F1020 Alcohol dependence, uncomplicated: Secondary | ICD-10-CM

## 2024-07-22 MED ORDER — NALTREXONE HCL 50 MG PO TABS
100.0000 mg | ORAL_TABLET | Freq: Every day | ORAL | 0 refills | Status: DC
  Filled 2024-07-22: qty 180, 90d supply, fill #0
  Filled 2024-08-04: qty 60, 30d supply, fill #0
  Filled 2024-09-07: qty 60, 30d supply, fill #1

## 2024-07-22 MED ORDER — ACAMPROSATE CALCIUM 333 MG PO TBEC
666.0000 mg | DELAYED_RELEASE_TABLET | Freq: Three times a day (TID) | ORAL | 2 refills | Status: DC
  Filled 2024-07-22 – 2024-08-16 (×2): qty 180, 30d supply, fill #0
  Filled 2024-09-20: qty 180, 30d supply, fill #1

## 2024-07-22 MED ORDER — MELATONIN 10 MG PO TABS: 0.5000 | ORAL_TABLET | Freq: Every day | ORAL | Status: AC

## 2024-07-22 MED ORDER — NALTREXONE HCL 50 MG PO TABS
100.0000 mg | ORAL_TABLET | Freq: Every day | ORAL | 0 refills | Status: DC
  Filled 2024-07-22: qty 120, 60d supply, fill #0

## 2024-07-22 MED ORDER — ACAMPROSATE CALCIUM 333 MG PO TBEC
666.0000 mg | DELAYED_RELEASE_TABLET | Freq: Three times a day (TID) | ORAL | 1 refills | Status: DC
  Filled 2024-07-22: qty 180, 30d supply, fill #0

## 2024-07-23 ENCOUNTER — Ambulatory Visit: Admitting: Neurology

## 2024-07-27 ENCOUNTER — Other Ambulatory Visit: Payer: Self-pay

## 2024-07-27 ENCOUNTER — Ambulatory Visit: Admitting: Dermatology

## 2024-07-27 ENCOUNTER — Encounter: Payer: Self-pay | Admitting: Neurology

## 2024-07-28 ENCOUNTER — Other Ambulatory Visit: Payer: Self-pay

## 2024-07-29 ENCOUNTER — Other Ambulatory Visit: Payer: Self-pay

## 2024-07-30 ENCOUNTER — Ambulatory Visit: Payer: Self-pay | Admitting: Plastic Surgery

## 2024-07-30 DIAGNOSIS — Z719 Counseling, unspecified: Secondary | ICD-10-CM

## 2024-07-30 NOTE — Progress Notes (Signed)
 Preoperative Dx: hyperpigmentation of face  Postoperative Dx:  same  Procedure: laser to face   Anesthesia: none  Description of Procedure:  Risks and complications were explained to the patient. Consent was confirmed and signed. Eye protection was placed. Time out was called and all information was confirmed to be correct. The area  area was prepped with alcohol and wiped dry. The BBL laser was set at 532 nm witih presettings for joules and 515 nm. The face was lasered. The patient tolerated the procedure well and there were no complications. The patient is to follow up in 4 weeks.

## 2024-08-04 ENCOUNTER — Other Ambulatory Visit: Payer: Self-pay

## 2024-08-11 ENCOUNTER — Other Ambulatory Visit (HOSPITAL_COMMUNITY): Payer: Self-pay

## 2024-08-13 ENCOUNTER — Other Ambulatory Visit (HOSPITAL_COMMUNITY): Payer: Self-pay

## 2024-08-13 ENCOUNTER — Telehealth: Payer: Self-pay | Admitting: Pharmacy Technician

## 2024-08-13 NOTE — Telephone Encounter (Signed)
 Pharmacy Patient Advocate Encounter  Received notification from [employer] that Prior Authorization for UBRELVY  100MG  has been APPROVED from 11.21.25 to 11.21.26   PA #/Case ID/Reference #: [id number]

## 2024-08-13 NOTE — Telephone Encounter (Signed)
 Pharmacy Patient Advocate Encounter   Received notification from CoverMyMeds that prior authorization for UBRELVY  100MG  is required/requested.   Insurance verification completed.   The patient is insured through [employer].   Per test claim: PA required; PA submitted to above mentioned insurance via Latent Key/confirmation #/EOC [id number] Status is pending

## 2024-08-16 ENCOUNTER — Telehealth: Payer: Self-pay | Admitting: Allergy

## 2024-08-16 ENCOUNTER — Other Ambulatory Visit: Payer: Self-pay

## 2024-08-16 NOTE — Telephone Encounter (Signed)
 Pt last seen 06/2023 so its been over a year! She was suppose to come back in 3 months. Dr Jeneal are you okay with sending in a courtesy refill of Epipen  and Xzyal?

## 2024-08-16 NOTE — Telephone Encounter (Signed)
 Rhonda Graves came in and is requesting refills for Epi-Pen and Xyzal  and would like it sent to [facility].  Rhonda Graves is scheduled to see Dr. Jeneal on January 30th at 10:40 am.  Rhonda Graves is asking if she needs to keep this appointment or is Dr. Jeneal ok with pushing it out some more?  Rhonda Graves was last seen on 07/03/23 with Chrissie.

## 2024-08-17 ENCOUNTER — Other Ambulatory Visit: Payer: Self-pay | Admitting: *Deleted

## 2024-08-17 ENCOUNTER — Other Ambulatory Visit: Payer: Self-pay

## 2024-08-17 MED ORDER — LEVOCETIRIZINE DIHYDROCHLORIDE 5 MG PO TABS
5.0000 mg | ORAL_TABLET | Freq: Every day | ORAL | 1 refills | Status: DC
  Filled 2024-08-17: qty 30, 30d supply, fill #0
  Filled 2024-09-20: qty 30, 30d supply, fill #1

## 2024-08-17 MED ORDER — EPINEPHRINE 0.3 MG/0.3ML IJ SOAJ
0.3000 mg | INTRAMUSCULAR | 1 refills | Status: AC | PRN
  Filled 2024-08-17: qty 2, 1d supply, fill #0
  Filled 2024-09-07: qty 2, 30d supply, fill #0

## 2024-08-17 NOTE — Telephone Encounter (Signed)
 Refills have been sent in. Called patient and informed to keep her appointment in January for further refills. Patient verbalized understanding.

## 2024-08-18 ENCOUNTER — Encounter: Admitting: Gastroenterology

## 2024-08-18 ENCOUNTER — Encounter: Payer: Self-pay | Admitting: Gastroenterology

## 2024-08-18 ENCOUNTER — Ambulatory Visit: Admitting: Gastroenterology

## 2024-08-18 VITALS — BP 126/78 | HR 67 | Temp 98.0°F | Resp 13 | Ht 61.0 in | Wt 140.4 lb

## 2024-08-18 DIAGNOSIS — K3189 Other diseases of stomach and duodenum: Secondary | ICD-10-CM | POA: Diagnosis not present

## 2024-08-18 DIAGNOSIS — F329 Major depressive disorder, single episode, unspecified: Secondary | ICD-10-CM | POA: Diagnosis not present

## 2024-08-18 DIAGNOSIS — K2970 Gastritis, unspecified, without bleeding: Secondary | ICD-10-CM | POA: Diagnosis not present

## 2024-08-18 DIAGNOSIS — K319 Disease of stomach and duodenum, unspecified: Secondary | ICD-10-CM | POA: Diagnosis not present

## 2024-08-18 DIAGNOSIS — K219 Gastro-esophageal reflux disease without esophagitis: Secondary | ICD-10-CM | POA: Diagnosis not present

## 2024-08-18 DIAGNOSIS — R1013 Epigastric pain: Secondary | ICD-10-CM

## 2024-08-18 DIAGNOSIS — F419 Anxiety disorder, unspecified: Secondary | ICD-10-CM | POA: Diagnosis not present

## 2024-08-18 MED ORDER — SODIUM CHLORIDE 0.9 % IV SOLN: 500.0000 mL | Freq: Once | INTRAVENOUS | Status: DC

## 2024-08-18 NOTE — Progress Notes (Signed)
 [facility] History and Physical   Primary Care Physician:  Theotis Haze ORN, NP   Reason for Procedure:   FU GU  Plan:    Rpt EGD   The patient was provided an opportunity to ask questions and all were answered. The patient agreed with the plan.   HPI: Rhonda Graves is a 43 y.o. female    Past Medical History:  Diagnosis Date   Abnormal Pap smear    had colpo in past, normal pap since then   AMA (advanced maternal age) multigravida 35+, second trimester 03/06/2017   Seen by [facility]. Low risk panorama. Normal NT. afp neg     Anxiety    Chronic hypertension in pregnancy 02/18/2014   [ ]  Aspirin 81 mg daily after 12 weeks; discontinue after 36 weeks  Current antihypertensives:  none     Baseline and surveillance labs (pulled in from [facility], refresh links as needed)           Lab Results      Component    Value    Date           PLT    327    02/02/2016           CREATININE    0.64    02/02/2016           AST    28    02/02/2016           ALT    39 (H)    02/02/2016           PRO   Depressive disorder    Dysmenorrhea    Eczema    GERD (gastroesophageal reflux disease) Dx 2010   Headache(784.0)    migraine   History of cesarean delivery 03/06/2017   Desires TOLAC, consent signed 06/09/2017        History of oral herpes simplex infection 05/29/2017   +IgM 1/2 antibody screen done for cold sores vs aphthous ulcers   IBS (irritable bowel syndrome)    Migraine    Multiple allergies    Polyhydramnios affecting pregnancy in third trimester 11/25/2013   [ ]  consider 39wk IOL vs EDC IOL  Had G1 IOL due to poly  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     Status post repeat low transverse cesarean section 08/20/2017   Supervision of high risk pregnancy, antepartum 01/06/2017            Clinic     [facility]    Prenatal Labs      Dating     LMP (IUI pregnancy)    Blood type: A/Positive/-- (04/16 1631) A pos      Genetic Screen    1 Screen: Neg   AFP:     Neg   Cffdna: low risk    Antibody:Negative (04/16  1631)neg      Anatomic US      Normal    Rubella: 4.57 (04/16 1631)imm      GTT    Third trimester: neg    RPR: Non Reactive (09/06 0836) neg      Flu vaccine     05/29/2017     Urticaria     Past Surgical History:  Procedure Laterality Date   CESAREAN SECTION N/A 11/26/2013   Procedure: CESAREAN SECTION;  Surgeon: Elveria Mungo, MD;  Location: [facility];  Service: Obstetrics;  Laterality: N/A;   CESAREAN SECTION N/A 08/17/2017   Procedure: CESAREAN SECTION;  Surgeon: Edsel Norleen GAILS, MD;  [facility];  Service: Obstetrics;  Laterality: N/A;   COLONOSCOPY  07/11/2023   ESOPHAGOGASTRODUODENOSCOPY  05/23/2011   Mild gastritis. Otherwise normal EGD   LASER ABLATION Right 10/30/2023   Right Greater saphenous Vein with 10-20 stab phlebectomies    Prior to Admission medications   Medication Sig Start Date End Date Taking? Authorizing Provider  acamprosate  (CAMPRAL ) 333 MG tablet Take 2 tablets (666 mg total) by mouth 3 (three) times daily. 07/22/24  Yes Izella Ismael NOVAK, MD  co-enzyme Q-10 30 MG capsule Take 300 mg by mouth daily.   Yes [provider]  cyclobenzaprine  (FLEXERIL ) 5 MG tablet Take 1 tablet (5 mg total) by mouth 3 (three) times daily as needed for muscle spasms. 06/16/24  Yes Theotis Haze ORN, NP  Dapsone  5 % topical gel Apply 1 Application topically at bedtime. 04/28/24  Yes Alm Delon SAILOR, DO  esomeprazole  (NEXIUM ) 40 MG capsule Take 1 capsule (40 mg total) by mouth daily FOR 12 WEEKS. 05/13/24  Yes Charlanne Groom, MD  levocetirizine (XYZAL ) 5 MG tablet Take 1 tablet by mouth 1 time daily. May take an additional tablet as needed for itching/hives flare. 08/17/24  Yes Jeneal Danita Macintosh, MD  Magnesium  400 MG CAPS Take by mouth.   Yes [provider]  naltrexone  (DEPADE) 50 MG tablet Take 2 tablets (100 mg total) by mouth at bedtime. 07/22/24  Yes Izella Ismael NOVAK, MD  Ubrogepant  (UBRELVY ) 100 MG TABS Take 1 tablet (100 mg total) by  mouth as needed. May repeat after 2 hours.  Maximum 2 tablets in 24 hours. 01/12/24  Yes Jaffe, Adam R, DO  Acetaminophen  (TYLENOL  PO) Take 500 mg by mouth as needed.    [provider]  azithromycin  (ZITHROMAX ) 250 MG tablet Take first 2 tablets together, then 1 every day until finished. 07/06/24   Mayer, Jodi R, NP  benzonatate  (TESSALON ) 200 MG capsule Take 1 capsule (200 mg total) by mouth 2 (two) times daily as needed for cough. Patient not taking: Reported on 08/18/2024 06/16/24   Fleming, Zelda W, NP  diclofenac  Sodium (VOLTAREN ) 1 % GEL Apply 2 g topically 4 (four) times daily. 06/16/24   Fleming, Zelda W, NP  dicyclomine  (BENTYL ) 10 MG capsule Take 1 capsule (10 mg total) by mouth 4 (four) times daily -  before meals and at bedtime. Patient not taking: Reported on 08/18/2024 05/31/24   May, Deanna J, NP  EPINEPHrine  0.3 mg/0.3 mL IJ SOAJ injection Inject 0.3 mg into the muscle as needed. 08/17/24   Jeneal Danita Macintosh, MD  Fremanezumab -vfrm (AJOVY ) 225 MG/1.5ML SOAJ Inject 225 mg into the skin every 28 (twenty-eight) days. 05/27/24   Skeet Juliene SAUNDERS, DO  Melatonin 10 MG TABS Take 0.5 tablets by mouth daily. Patient not taking: Reported on 08/18/2024 07/22/24   Izella Ismael NOVAK, MD  promethazine -dextromethorphan (PROMETHAZINE -DM) 6.25-15 MG/5ML syrup Take 5 mLs by mouth 4 (four) times daily as needed for cough. 07/06/24   Mayer, Jodi R, NP    Current Outpatient Medications  Medication Sig Dispense Refill   acamprosate  (CAMPRAL ) 333 MG tablet Take 2 tablets (666 mg total) by mouth 3 (three) times daily. 180 tablet 2   co-enzyme Q-10 30 MG capsule Take 300 mg by mouth daily.     cyclobenzaprine  (FLEXERIL ) 5 MG tablet Take 1 tablet (5 mg total) by mouth 3 (three) times daily as needed for muscle spasms. 60 tablet 3   Dapsone  5 % topical gel Apply 1 Application topically at  bedtime. 60 g 2   esomeprazole  (NEXIUM ) 40 MG capsule Take 1 capsule (40 mg total) by mouth daily FOR 12 WEEKS.  90 capsule 1   levocetirizine (XYZAL ) 5 MG tablet Take 1 tablet by mouth 1 time daily. May take an additional tablet as needed for itching/hives flare. 30 tablet 1   Magnesium  400 MG CAPS Take by mouth.     naltrexone  (DEPADE) 50 MG tablet Take 2 tablets (100 mg total) by mouth at bedtime. 180 tablet 0   Ubrogepant  (UBRELVY ) 100 MG TABS Take 1 tablet (100 mg total) by mouth as needed. May repeat after 2 hours.  Maximum 2 tablets in 24 hours. 16 tablet 5   Acetaminophen  (TYLENOL  PO) Take 500 mg by mouth as needed.     azithromycin  (ZITHROMAX ) 250 MG tablet Take first 2 tablets together, then 1 every day until finished. 6 tablet 0   benzonatate  (TESSALON ) 200 MG capsule Take 1 capsule (200 mg total) by mouth 2 (two) times daily as needed for cough. (Patient not taking: Reported on 08/18/2024) 20 capsule 1   diclofenac  Sodium (VOLTAREN ) 1 % GEL Apply 2 g topically 4 (four) times daily. 200 g 3   dicyclomine  (BENTYL ) 10 MG capsule Take 1 capsule (10 mg total) by mouth 4 (four) times daily -  before meals and at bedtime. (Patient not taking: Reported on 08/18/2024) 90 capsule 0   EPINEPHrine  0.3 mg/0.3 mL IJ SOAJ injection Inject 0.3 mg into the muscle as needed. 2 each 1   Fremanezumab -vfrm (AJOVY ) 225 MG/1.5ML SOAJ Inject 225 mg into the skin every 28 (twenty-eight) days. 1.5 mL 11   Melatonin 10 MG TABS Take 0.5 tablets by mouth daily. (Patient not taking: Reported on 08/18/2024)     promethazine -dextromethorphan (PROMETHAZINE -DM) 6.25-15 MG/5ML syrup Take 5 mLs by mouth 4 (four) times daily as needed for cough. 118 mL 0   No current facility-administered medications for this visit.    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 04/08/2013   Peanut -containing drug products Hives 04/08/2013   Hemabate  [carboprost  tromethamine ] Itching and Swelling 08/17/2017   Lomotil  [diphenoxylate ] Itching  and Swelling 08/17/2017   Lysteda  [tranexamic acid ] Itching and Swelling 08/17/2017    Family History  Problem Relation Age of Onset   Migraines Mother    Hypothyroidism Mother    Arthritis Mother    Allergic rhinitis Father    Asthma Brother    Allergic rhinitis Brother    Alzheimer's disease Maternal Grandmother    Colon cancer Paternal Grandmother    Breast cancer Neg Hx    Esophageal cancer Neg Hx    Stomach cancer Neg Hx    Rectal cancer Neg Hx     Social History   Socioeconomic History   Marital status: Married    Spouse name: Not on file   Number of children: 1    Years of education: college    Highest education level: Some college, no degree  Occupational History   Occupation: Equities Trader   Tobacco Use   Smoking status: Never    Passive exposure: Never   Smokeless tobacco: Never  Vaping Use   Vaping status: Never Used  Substance and Sexual Activity   Alcohol use: Yes    Comment: occasionally    Drug use: No   Sexual activity: Yes    Birth control/protection: None  Other Topics Concern   Not on file  Social History Narrative   Originally from [place].   Has one daughter.   Lives with husband and daughter.    Left handed    Social Drivers of Health   Financial Resource Strain: Medium Risk (06/10/2024)   Overall Financial Resource Strain (CARDIA)    Difficulty of Paying Living Expenses: Somewhat hard  Food Insecurity: Food Insecurity Present (06/10/2024)   Hunger Vital Sign    Worried About Running Out of Food in the Last Year: Sometimes true    Ran Out of Food in the Last Year: Never true  Transportation Needs: No Transportation Needs (06/10/2024)   PRAPARE - Administrator, Civil Service (Medical): No    Lack of Transportation (Non-Medical): No  Physical Activity: Insufficiently Active (06/10/2024)   Exercise Vital Sign    Days of Exercise per Week: 3 days    Minutes of Exercise per Session: 30 min  Stress: No Stress Concern Present  (06/10/2024)   [employer] of Occupational Health - Occupational Stress Questionnaire    Feeling of Stress: Only a little  Social Connections: Socially Integrated (06/10/2024)   Social Connection and Isolation Panel    Frequency of Communication with Friends and Family: More than three times a week    Frequency of Social Gatherings with Friends and Family: More than three times a week    Attends Religious Services: More than 4 times per year    Active Member of [employer] or Organizations: Yes    Attends Engineer, Structural: More than 4 times per year    Marital Status: Married  Catering Manager Violence: Not At Risk (10/09/2023)   Humiliation, Afraid, Rape, and Kick questionnaire    Fear of Current or Ex-Partner: No    Emotionally Abused: No    Physically Abused: No    Sexually Abused: No    Review of Systems: Positive for none All other review of systems negative except as mentioned in the HPI.  Physical Exam: Vital signs in last 24 hours: @VSRANGES @   General:   Alert,  Well-developed, well-nourished, pleasant and cooperative in NAD Lungs:  Clear throughout to auscultation.   Heart:  Regular rate and rhythm; no murmurs, clicks, rubs,  or gallops. Abdomen:  Soft, nontender and nondistended. Normal bowel sounds.   Neuro/Psych:  Alert and cooperative. Normal mood and affect. A and O x 3    No significant changes were identified.  The patient continues to be an appropriate candidate for the planned procedure and anesthesia.   Anselm Bring, MD. [facility] 08/18/2024 11:12 AM@

## 2024-08-18 NOTE — Patient Instructions (Signed)
 Resume previous diet. Continue present medications nexium  x2 weeks, then stop and use it on as needed basis. Awaiting pathology results. Avoid nonsteroidals.  Handout provided on gastritis.  YOU HAD AN ENDOSCOPIC PROCEDURE TODAY AT THE [facility]:   Refer to the procedure report that was given to you for any specific questions about what was found during the examination.  If the procedure report does not answer your questions, please call your gastroenterologist to clarify.  If you requested that your care partner not be given the details of your procedure findings, then the procedure report has been included in a sealed envelope for you to review at your convenience later.  YOU SHOULD EXPECT: Some feelings of bloating in the abdomen. Passage of more gas than usual.  Walking can help get rid of the air that was put into your GI tract during the procedure and reduce the bloating. If you had a lower endoscopy (such as a colonoscopy or flexible sigmoidoscopy) you may notice spotting of blood in your stool or on the toilet paper. If you underwent a bowel prep for your procedure, you may not have a normal bowel movement for a few days.  Please Note:  You might notice some irritation and congestion in your nose or some drainage.  This is from the oxygen used during your procedure.  There is no need for concern and it should clear up in a day or so.  SYMPTOMS TO REPORT IMMEDIATELY:  Following upper endoscopy (EGD)  Vomiting of blood or coffee ground material  New chest pain or pain under the shoulder blades  Painful or persistently difficult swallowing  New shortness of breath  Fever of 100F or higher  Black, tarry-looking stools  For urgent or emergent issues, a gastroenterologist can be reached at any hour by calling [phone]. Do not use MyChart messaging for urgent concerns.    DIET:  We do recommend a small meal at first, but then you may proceed to your regular diet.   Drink plenty of fluids but you should avoid alcoholic beverages for 24 hours.  ACTIVITY:  You should plan to take it easy for the rest of today and you should NOT DRIVE or use heavy machinery until tomorrow (because of the sedation medicines used during the test).    FOLLOW UP: Our staff will call the number listed on your records the next business day following your procedure.  We will call around 7:15- 8:00 am to check on you and address any questions or concerns that you may have regarding the information given to you following your procedure. If we do not reach you, we will leave a message.     If any biopsies were taken you will be contacted by phone or by letter within the next 1-3 weeks.  Please call us  at [phone] if you have not heard about the biopsies in 3 weeks.    SIGNATURES/CONFIDENTIALITY: You and/or your care partner have signed paperwork which will be entered into your electronic medical record.  These signatures attest to the fact that that the information above on your After Visit Summary has been reviewed and is understood.  Full responsibility of the confidentiality of this discharge information lies with you and/or your care-partner.

## 2024-08-18 NOTE — Op Note (Signed)
 [facility] Patient Name: Rhonda Graves Procedure Date: 08/18/2024 10:34 AM MRN: [id number] Endoscopist: Lynnie Bring , MD, [id number] Age: 43 Referring MD:  Date of Birth: Apr 27, 1981 Gender: Female Account #: [id number] Procedure:                Upper GI endoscopy Indications:              FU gastric ulcer. Medicines:                Monitored Anesthesia Care Procedure:                Pre-Anesthesia Assessment:                           - Prior to the procedure, a History and Physical                            was performed, and patient medications and                            allergies were reviewed. The patient's tolerance of                            previous anesthesia was also reviewed. The risks                            and benefits of the procedure and the sedation                            options and risks were discussed with the patient.                            All questions were answered, and informed consent                            was obtained. Prior Anticoagulants: The patient has                            taken no anticoagulant or antiplatelet agents. ASA                            Grade Assessment: I - A normal, healthy patient.                            After reviewing the risks and benefits, the patient                            was deemed in satisfactory condition to undergo the                            procedure.                           After obtaining informed consent, the endoscope was  passed under direct vision. Throughout the                            procedure, the patient's blood pressure, pulse, and                            oxygen saturations were monitored continuously. The                            GIF [id number] #[id number] was introduced through the                            mouth, and advanced to the second part of duodenum.                            The upper GI endoscopy was accomplished without                             difficulty. The patient tolerated the procedure                            well. Scope In: Scope Out: Findings:                 The examined esophagus was normal.                           Gastric ulcer has healed completely. Scar was                            noted. Localized minimal inflammation characterized                            by erythema was found in the gastric antrum.                            Biopsies were taken with a cold forceps for                            histology.                           The examined duodenum was normal. Complications:            No immediate complications. Estimated Blood Loss:     Estimated blood loss: none. Impression:               - Complete healing of gastric ulcer                           - Minimal gastritis. Biopsied.                           - Otherwise normal EGD. Recommendation:           - Patient has a contact number available for  emergencies. The signs and symptoms of potential                            delayed complications were discussed with the                            patient. Return to normal activities tomorrow.                            Written discharge instructions were provided to the                            patient.                           - Resume previous diet.                           - Continue present medications nexium  x 2 weeks,                            then stop and use it on a as needed basis.                           - Await pathology results.                           - Avoid nonsteroidals.                           - The findings and recommendations were discussed                            with the patient's family. Lynnie Bring, MD 08/18/2024 10:56:56 AM This report has been signed electronically.

## 2024-08-18 NOTE — Progress Notes (Signed)
 Called to room to assist during endoscopic procedure.  Patient ID and intended procedure confirmed with present staff. Received instructions for my participation in the procedure from the performing physician.

## 2024-08-18 NOTE — Progress Notes (Signed)
 Report to PACU, RN, vss, BBS= Clear.

## 2024-08-18 NOTE — Progress Notes (Signed)
 Pt's states no medical or surgical changes since previsit or office visit.

## 2024-08-23 ENCOUNTER — Telehealth: Payer: Self-pay

## 2024-08-23 NOTE — Telephone Encounter (Signed)
 No answer on follow-up call. Left VM for pt.

## 2024-08-24 ENCOUNTER — Other Ambulatory Visit: Payer: Self-pay

## 2024-08-24 LAB — SURGICAL PATHOLOGY

## 2024-08-25 ENCOUNTER — Other Ambulatory Visit: Payer: Self-pay

## 2024-08-25 MED ORDER — COVID-19 MRNA VAC-TRIS(PFIZER) 30 MCG/0.3ML IM SUSY
0.3000 mL | PREFILLED_SYRINGE | Freq: Once | INTRAMUSCULAR | 0 refills | Status: AC
  Filled 2024-08-25: qty 0.3, 1d supply, fill #0

## 2024-08-26 ENCOUNTER — Other Ambulatory Visit: Payer: Self-pay

## 2024-08-28 ENCOUNTER — Ambulatory Visit: Payer: Self-pay | Admitting: Gastroenterology

## 2024-08-30 ENCOUNTER — Other Ambulatory Visit: Payer: Self-pay

## 2024-09-01 ENCOUNTER — Other Ambulatory Visit: Payer: Self-pay

## 2024-09-07 ENCOUNTER — Other Ambulatory Visit: Payer: Self-pay

## 2024-09-08 ENCOUNTER — Ambulatory Visit: Admitting: Podiatry

## 2024-09-08 ENCOUNTER — Other Ambulatory Visit: Payer: Self-pay

## 2024-09-08 ENCOUNTER — Encounter: Payer: Self-pay | Admitting: Podiatry

## 2024-09-08 VITALS — Ht 61.0 in | Wt 140.0 lb

## 2024-09-08 DIAGNOSIS — B351 Tinea unguium: Secondary | ICD-10-CM

## 2024-09-08 MED ORDER — TERBINAFINE HCL 250 MG PO TABS
250.0000 mg | ORAL_TABLET | Freq: Every day | ORAL | 0 refills | Status: AC
  Filled 2024-09-08: qty 30, 30d supply, fill #0
  Filled 2024-10-04: qty 30, 30d supply, fill #1

## 2024-09-08 NOTE — Progress Notes (Signed)
 "  Chief Complaint  Patient presents with   Nail Problem    Pt is here due to possible nail fungus to the right 3rd toe, and possible athletes feet due to peeling of the skin under the toes.     Subjective: 43 y.o. female presenting today for evaluation of discoloration and thickening to the right third toenail.  Concern for toenail fungus.  Past Medical History:  Diagnosis Date   Abnormal Pap smear    had colpo in past, normal pap since then   AMA (advanced maternal age) multigravida 35+, second trimester 03/06/2017   Seen by [facility]. Low risk panorama. Normal NT. afp neg     Anxiety    Chronic hypertension in pregnancy 02/18/2014   [ ]  Aspirin 81 mg daily after 12 weeks; discontinue after 36 weeks  Current antihypertensives:  none     Baseline and surveillance labs (pulled in from [facility], refresh links as needed)           Lab Results      Component    Value    Date           PLT    327    02/02/2016           CREATININE    0.64    02/02/2016           AST    28    02/02/2016           ALT    39 (H)    02/02/2016           PRO   Depressive disorder    Dysmenorrhea    Eczema    GERD (gastroesophageal reflux disease) Dx 2010   Headache(784.0)    migraine   History of cesarean delivery 03/06/2017   Desires TOLAC, consent signed 06/09/2017        History of oral herpes simplex infection 05/29/2017   +IgM 1/2 antibody screen done for cold sores vs aphthous ulcers   IBS (irritable bowel syndrome)    Migraine    Multiple allergies    Polyhydramnios affecting pregnancy in third trimester 11/25/2013   [ ]  consider 39wk IOL vs EDC IOL  Had G1 IOL due to poly  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     Status post repeat low transverse cesarean section 08/20/2017   Supervision of high risk pregnancy, antepartum 01/06/2017            Clinic     [facility]    Prenatal Labs      Dating     LMP (IUI pregnancy)    Blood type: A/Positive/-- (04/16 1631) A pos      Genetic Screen    1 Screen: Neg   AFP:     Neg   Cffdna:  low risk    Antibody:Negative (04/16 1631)neg      Anatomic US      Normal    Rubella: 4.57 (04/16 1631)imm      GTT    Third trimester: neg    RPR: Non Reactive (09/06 0836) neg      Flu vaccine     05/29/2017     Urticaria     Past Surgical History:  Procedure Laterality Date   CESAREAN SECTION N/A 11/26/2013   Procedure: CESAREAN SECTION;  Surgeon: Elveria Mungo, MD;  Location: [facility];  Service: Obstetrics;  Laterality: N/A;   CESAREAN SECTION N/A 08/17/2017   Procedure: CESAREAN SECTION;  Surgeon: Edsel Rush  V, MD;  [facility];  Service: Obstetrics;  Laterality: N/A;   COLONOSCOPY  07/11/2023   ESOPHAGOGASTRODUODENOSCOPY  05/23/2011   Mild gastritis. Otherwise normal EGD   LASER ABLATION Right 10/30/2023   Right Greater saphenous Vein with 10-20 stab phlebectomies    Allergies[1]   RT third toe 09/08/2024  Objective: Physical Exam General: The patient is alert and oriented x3 in no acute distress.  Dermatology: Hyperkeratotic, discolored, thickened, onychodystrophy noted. Skin is warm, dry and supple bilateral lower extremities. Negative for open lesions or macerations.  Vascular: Palpable pedal pulses bilaterally. No edema or erythema noted. Capillary refill within normal limits.  Neurological: Grossly intact via light touch  Musculoskeletal Exam: No pedal deformity noted  Assessment: #1 Onychomycosis of toenails  Plan of Care:  -Patient was evaluated. -Declined topical lotrisone. Reports that she is not very consistent with topical creams to apply -Today we discussed different treatment options including oral, topical, and laser antifungal treatment modalities.  We discussed their efficacies and side effects.  Patient opts for oral antifungal treatment modality -Prescription for Lamisil  250 mg #90 daily. Pt denies a history of liver pathology or symptoms.  CMP 06/18/2024 -Return to clinic as needed   Thresa EMERSON Sar, DPM [facility]  Dr. Thresa EMERSON Sar, DPM    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 Uncertain what caused reaction.   Lomotil  [Diphenoxylate ] Itching and Swelling    Had allergic reaction after Lomotil , Lysteda  and Hemabate . Uncertain what caused reaction.   Lysteda  [Tranexamic Acid ] Itching and Swelling    Had allergic reaction after Lomotil , Lysteda  and Hemabate . Uncertain what caused reaction.   "

## 2024-09-09 ENCOUNTER — Other Ambulatory Visit: Payer: Self-pay

## 2024-09-13 ENCOUNTER — Ambulatory Visit: Admitting: Podiatry

## 2024-09-14 ENCOUNTER — Other Ambulatory Visit: Payer: Self-pay | Admitting: Plastic Surgery

## 2024-09-14 ENCOUNTER — Other Ambulatory Visit: Payer: Self-pay

## 2024-09-15 ENCOUNTER — Other Ambulatory Visit: Payer: Self-pay

## 2024-09-17 ENCOUNTER — Other Ambulatory Visit (HOSPITAL_COMMUNITY): Payer: Self-pay

## 2024-09-17 ENCOUNTER — Telehealth: Payer: Self-pay | Admitting: Pharmacist

## 2024-09-17 ENCOUNTER — Other Ambulatory Visit: Payer: Self-pay

## 2024-09-17 NOTE — Telephone Encounter (Signed)
 Pharmacy Patient Advocate Encounter  Received notification from [employer] that Prior Authorization for Fremanezumab -vfrm (AJOVY ) 225 MG/1.5ML SOAJ has been APPROVED from 09/17/2024 to 09/17/2025   PA #/Case ID/Reference #: [id number]

## 2024-09-17 NOTE — Telephone Encounter (Signed)
 Pharmacy Patient Advocate Encounter   Received notification from CoverMyMeds that prior authorization for AJOVY  (fremanezumab -vfrm) injection 225MG /1.5ML auto-injectors is required/requested.   Insurance verification completed.   The patient is insured through [employer].   Per test claim: PA required; PA submitted to above mentioned insurance via Latent Key/confirmation #/EOC [facility] Status is pending

## 2024-09-21 ENCOUNTER — Other Ambulatory Visit: Payer: Self-pay

## 2024-09-24 ENCOUNTER — Telehealth (HOSPITAL_COMMUNITY): Payer: Self-pay

## 2024-09-24 NOTE — Telephone Encounter (Signed)
 For the last couple of weeks and she has not been getting to sleep until around 3 am. Patient would like a call as soon as possible from Dr. Izella, would like the call sooner that her appt on 09/30/24 at 4pm.

## 2024-09-24 NOTE — Telephone Encounter (Signed)
 For the last couple of weeks and she has not been getting to sleep until around 3 am. Patient would like a call as soon as possible from Dr. Izella, would like the call sooner that her appt on 09/30/24 at 4pm. Please advise. Thank you.
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
## 2024-09-28 ENCOUNTER — Other Ambulatory Visit: Payer: Self-pay

## 2024-09-30 ENCOUNTER — Telehealth (INDEPENDENT_AMBULATORY_CARE_PROVIDER_SITE_OTHER): Admitting: Psychiatry

## 2024-09-30 ENCOUNTER — Other Ambulatory Visit: Payer: Self-pay

## 2024-09-30 DIAGNOSIS — F1020 Alcohol dependence, uncomplicated: Secondary | ICD-10-CM

## 2024-09-30 DIAGNOSIS — F411 Generalized anxiety disorder: Secondary | ICD-10-CM | POA: Diagnosis not present

## 2024-09-30 DIAGNOSIS — F10982 Alcohol use, unspecified with alcohol-induced sleep disorder: Secondary | ICD-10-CM

## 2024-09-30 MED ORDER — HYDROXYZINE HCL 25 MG PO TABS
25.0000 mg | ORAL_TABLET | Freq: Every evening | ORAL | 1 refills | Status: AC | PRN
  Filled 2024-09-30: qty 60, 30d supply, fill #0

## 2024-09-30 MED ORDER — NALTREXONE HCL 50 MG PO TABS
100.0000 mg | ORAL_TABLET | Freq: Every day | ORAL | 0 refills | Status: AC
  Filled 2024-09-30: qty 180, 90d supply, fill #0
  Filled 2024-10-04: qty 60, 30d supply, fill #0

## 2024-09-30 MED ORDER — ACAMPROSATE CALCIUM 333 MG PO TBEC
666.0000 mg | DELAYED_RELEASE_TABLET | Freq: Three times a day (TID) | ORAL | 2 refills | Status: AC
  Filled 2024-09-30: qty 180, 30d supply, fill #0

## 2024-10-04 ENCOUNTER — Other Ambulatory Visit: Payer: Self-pay

## 2024-10-05 ENCOUNTER — Other Ambulatory Visit: Payer: Self-pay

## 2024-10-12 ENCOUNTER — Other Ambulatory Visit: Payer: Self-pay

## 2024-10-15 ENCOUNTER — Other Ambulatory Visit: Payer: Self-pay

## 2024-10-18 ENCOUNTER — Other Ambulatory Visit: Payer: Self-pay

## 2024-10-19 ENCOUNTER — Other Ambulatory Visit: Payer: Self-pay

## 2024-10-19 ENCOUNTER — Other Ambulatory Visit: Payer: Self-pay | Admitting: Allergy

## 2024-10-19 MED ORDER — LEVOCETIRIZINE DIHYDROCHLORIDE 5 MG PO TABS
5.0000 mg | ORAL_TABLET | Freq: Every day | ORAL | 1 refills | Status: AC
  Filled 2024-10-19: qty 30, 30d supply, fill #0

## 2024-10-21 ENCOUNTER — Other Ambulatory Visit: Payer: Self-pay

## 2024-10-22 ENCOUNTER — Ambulatory Visit: Admitting: Allergy

## 2024-10-28 ENCOUNTER — Ambulatory Visit: Payer: Self-pay | Admitting: Obstetrics and Gynecology

## 2024-11-18 ENCOUNTER — Ambulatory Visit: Admitting: Allergy

## 2024-11-29 ENCOUNTER — Ambulatory Visit: Admitting: Obstetrics and Gynecology

## 2024-12-01 ENCOUNTER — Ambulatory Visit: Admitting: Neurology

## 2024-12-28 ENCOUNTER — Ambulatory Visit: Admitting: Dermatology
